# Patient Record
Sex: Female | Born: 1970
Health system: Southern US, Community
[De-identification: ages and names within clinical notes are randomized; demographics above are authoritative.]

## PROBLEM LIST (undated history)

## (undated) DIAGNOSIS — M545 Low back pain, unspecified: Secondary | ICD-10-CM

## (undated) DIAGNOSIS — I10 Essential (primary) hypertension: Secondary | ICD-10-CM

## (undated) DIAGNOSIS — J45909 Unspecified asthma, uncomplicated: Secondary | ICD-10-CM

## (undated) DIAGNOSIS — G4730 Sleep apnea, unspecified: Secondary | ICD-10-CM

## (undated) DIAGNOSIS — R0600 Dyspnea, unspecified: Secondary | ICD-10-CM

## (undated) DIAGNOSIS — C801 Malignant (primary) neoplasm, unspecified: Secondary | ICD-10-CM

## (undated) DIAGNOSIS — M1990 Unspecified osteoarthritis, unspecified site: Secondary | ICD-10-CM

## (undated) DIAGNOSIS — Z9289 Personal history of other medical treatment: Secondary | ICD-10-CM

## (undated) DIAGNOSIS — G43909 Migraine, unspecified, not intractable, without status migrainosus: Secondary | ICD-10-CM

## (undated) DIAGNOSIS — L309 Dermatitis, unspecified: Secondary | ICD-10-CM

## (undated) DIAGNOSIS — J309 Allergic rhinitis, unspecified: Secondary | ICD-10-CM

## (undated) DIAGNOSIS — F419 Anxiety disorder, unspecified: Secondary | ICD-10-CM

## (undated) HISTORY — DX: Dermatitis, unspecified: L30.9

## (undated) HISTORY — DX: Allergic rhinitis, unspecified: J30.9

## (undated) HISTORY — PX: OTHER SURGICAL HISTORY: SHX169

## (undated) HISTORY — PX: ABDOMINAL HYSTERECTOMY: SHX81

## (undated) HISTORY — PX: LIVER BIOPSY: SHX301

---

## 1998-03-14 ENCOUNTER — Emergency Department (HOSPITAL_COMMUNITY): Admission: EM | Admit: 1998-03-14 | Discharge: 1998-03-14 | Payer: Self-pay | Admitting: Emergency Medicine

## 1998-03-23 ENCOUNTER — Emergency Department (HOSPITAL_COMMUNITY): Admission: EM | Admit: 1998-03-23 | Discharge: 1998-03-23 | Payer: Self-pay | Admitting: Emergency Medicine

## 1998-11-29 ENCOUNTER — Emergency Department (HOSPITAL_COMMUNITY): Admission: EM | Admit: 1998-11-29 | Discharge: 1998-11-29 | Payer: Self-pay | Admitting: Emergency Medicine

## 2000-02-03 ENCOUNTER — Inpatient Hospital Stay (HOSPITAL_COMMUNITY): Admission: EM | Admit: 2000-02-03 | Discharge: 2000-02-03 | Payer: Self-pay | Admitting: *Deleted

## 2002-11-20 ENCOUNTER — Emergency Department (HOSPITAL_COMMUNITY): Admission: EM | Admit: 2002-11-20 | Discharge: 2002-11-20 | Payer: Self-pay | Admitting: Emergency Medicine

## 2002-11-28 ENCOUNTER — Encounter: Payer: Self-pay | Admitting: General Practice

## 2002-11-28 ENCOUNTER — Encounter: Admission: RE | Admit: 2002-11-28 | Discharge: 2002-11-28 | Payer: Self-pay | Admitting: General Practice

## 2003-02-04 ENCOUNTER — Emergency Department (HOSPITAL_COMMUNITY): Admission: EM | Admit: 2003-02-04 | Discharge: 2003-02-04 | Payer: Self-pay | Admitting: Emergency Medicine

## 2003-02-17 ENCOUNTER — Ambulatory Visit (HOSPITAL_COMMUNITY): Admission: RE | Admit: 2003-02-17 | Discharge: 2003-02-17 | Payer: Self-pay | Admitting: Obstetrics and Gynecology

## 2003-02-17 ENCOUNTER — Encounter: Payer: Self-pay | Admitting: Obstetrics and Gynecology

## 2003-03-22 ENCOUNTER — Other Ambulatory Visit: Admission: RE | Admit: 2003-03-22 | Discharge: 2003-03-22 | Payer: Self-pay | Admitting: Obstetrics and Gynecology

## 2003-04-03 ENCOUNTER — Inpatient Hospital Stay (HOSPITAL_COMMUNITY): Admission: AD | Admit: 2003-04-03 | Discharge: 2003-04-03 | Payer: Self-pay | Admitting: Obstetrics and Gynecology

## 2003-06-29 ENCOUNTER — Inpatient Hospital Stay (HOSPITAL_COMMUNITY): Admission: AD | Admit: 2003-06-29 | Discharge: 2003-06-29 | Payer: Self-pay | Admitting: Obstetrics and Gynecology

## 2003-09-22 ENCOUNTER — Ambulatory Visit (HOSPITAL_COMMUNITY): Admission: RE | Admit: 2003-09-22 | Discharge: 2003-09-22 | Payer: Self-pay | Admitting: Obstetrics and Gynecology

## 2003-10-10 ENCOUNTER — Ambulatory Visit (HOSPITAL_COMMUNITY): Admission: RE | Admit: 2003-10-10 | Discharge: 2003-10-10 | Payer: Self-pay | Admitting: Obstetrics and Gynecology

## 2003-10-24 ENCOUNTER — Ambulatory Visit (HOSPITAL_COMMUNITY): Admission: RE | Admit: 2003-10-24 | Discharge: 2003-10-24 | Payer: Self-pay | Admitting: Obstetrics and Gynecology

## 2003-11-16 ENCOUNTER — Inpatient Hospital Stay (HOSPITAL_COMMUNITY): Admission: AD | Admit: 2003-11-16 | Discharge: 2003-11-18 | Payer: Self-pay | Admitting: *Deleted

## 2004-10-30 ENCOUNTER — Other Ambulatory Visit: Admission: RE | Admit: 2004-10-30 | Discharge: 2004-10-30 | Payer: Self-pay | Admitting: Obstetrics and Gynecology

## 2005-02-25 ENCOUNTER — Emergency Department (HOSPITAL_COMMUNITY): Admission: EM | Admit: 2005-02-25 | Discharge: 2005-02-25 | Payer: Self-pay | Admitting: Emergency Medicine

## 2005-02-28 ENCOUNTER — Encounter (HOSPITAL_COMMUNITY): Admission: RE | Admit: 2005-02-28 | Discharge: 2005-03-26 | Payer: Self-pay | Admitting: Emergency Medicine

## 2005-03-14 ENCOUNTER — Emergency Department (HOSPITAL_COMMUNITY): Admission: EM | Admit: 2005-03-14 | Discharge: 2005-03-14 | Payer: Self-pay | Admitting: Emergency Medicine

## 2005-10-31 ENCOUNTER — Other Ambulatory Visit: Admission: RE | Admit: 2005-10-31 | Discharge: 2005-10-31 | Payer: Self-pay | Admitting: Obstetrics and Gynecology

## 2006-01-14 ENCOUNTER — Encounter: Payer: Self-pay | Admitting: Obstetrics and Gynecology

## 2006-04-17 ENCOUNTER — Other Ambulatory Visit: Admission: RE | Admit: 2006-04-17 | Discharge: 2006-04-17 | Payer: Self-pay | Admitting: Obstetrics and Gynecology

## 2010-08-09 ENCOUNTER — Emergency Department (HOSPITAL_COMMUNITY): Admission: EM | Admit: 2010-08-09 | Discharge: 2010-08-09 | Payer: Self-pay | Admitting: Emergency Medicine

## 2010-10-05 ENCOUNTER — Encounter: Payer: Self-pay | Admitting: Obstetrics and Gynecology

## 2010-11-26 LAB — URINE MICROSCOPIC-ADD ON

## 2010-11-26 LAB — DIFFERENTIAL
Basophils Absolute: 0 10*3/uL (ref 0.0–0.1)
Basophils Relative: 0 % (ref 0–1)
Eosinophils Absolute: 0.1 10*3/uL (ref 0.0–0.7)
Neutro Abs: 10 10*3/uL — ABNORMAL HIGH (ref 1.7–7.7)
Neutrophils Relative %: 77 % (ref 43–77)

## 2010-11-26 LAB — URINALYSIS, ROUTINE W REFLEX MICROSCOPIC
Bilirubin Urine: NEGATIVE
Bilirubin Urine: NEGATIVE
Glucose, UA: >1000 mg/dL — AB
Ketones, ur: NEGATIVE mg/dL
Leukocytes, UA: NEGATIVE
Nitrite: NEGATIVE
Protein, ur: NEGATIVE mg/dL
Specific Gravity, Urine: 1.037 — ABNORMAL HIGH (ref 1.005–1.030)
Urobilinogen, UA: 0.2 mg/dL (ref 0.0–1.0)
pH: 5 (ref 5.0–8.0)
pH: 6.5 (ref 5.0–8.0)

## 2010-11-26 LAB — URINE CULTURE: Culture  Setup Time: 201111251545

## 2010-11-26 LAB — POCT I-STAT, CHEM 8
Glucose, Bld: 86 mg/dL (ref 70–99)
HCT: 39 % (ref 36.0–46.0)
Hemoglobin: 13.3 g/dL (ref 12.0–15.0)
Potassium: 3.9 mEq/L (ref 3.5–5.1)
Sodium: 142 mEq/L (ref 135–145)
TCO2: 28 mmol/L (ref 0–100)

## 2010-11-26 LAB — CBC
MCH: 28.6 pg (ref 26.0–34.0)
MCHC: 31.2 g/dL (ref 30.0–36.0)
RDW: 13.8 % (ref 11.5–15.5)

## 2010-11-26 LAB — POCT CARDIAC MARKERS
CKMB, poc: <1 ng/mL — ABNORMAL LOW (ref 1.0–8.0)
Myoglobin, poc: 76.6 ng/mL (ref 12–200)

## 2010-11-26 LAB — RAPID STREP SCREEN (MED CTR MEBANE ONLY): Streptococcus, Group A Screen (Direct): NEGATIVE

## 2011-01-06 ENCOUNTER — Inpatient Hospital Stay (HOSPITAL_COMMUNITY): Payer: Medicaid Other

## 2011-01-06 ENCOUNTER — Inpatient Hospital Stay (HOSPITAL_COMMUNITY)
Admission: AD | Admit: 2011-01-06 | Discharge: 2011-01-06 | Disposition: A | Discharge status: Home / Self Care | Payer: Medicaid Other | Source: Ambulatory Visit | Attending: Obstetrics and Gynecology | Admitting: Obstetrics and Gynecology

## 2011-01-06 DIAGNOSIS — N949 Unspecified condition associated with female genital organs and menstrual cycle: Secondary | ICD-10-CM

## 2011-01-06 DIAGNOSIS — D500 Iron deficiency anemia secondary to blood loss (chronic): Secondary | ICD-10-CM

## 2011-01-06 DIAGNOSIS — N938 Other specified abnormal uterine and vaginal bleeding: Secondary | ICD-10-CM | POA: Insufficient documentation

## 2011-01-06 LAB — URINALYSIS, ROUTINE W REFLEX MICROSCOPIC
Bilirubin Urine: NEGATIVE
Glucose, UA: NEGATIVE mg/dL
Ketones, ur: NEGATIVE mg/dL
Nitrite: NEGATIVE
Specific Gravity, Urine: >1.03 — ABNORMAL HIGH (ref 1.005–1.030)
pH: 5 (ref 5.0–8.0)

## 2011-01-06 LAB — CBC
HCT: 35.8 % — ABNORMAL LOW (ref 36.0–46.0)
Hemoglobin: 10.8 g/dL — ABNORMAL LOW (ref 12.0–15.0)
MCH: 27.3 pg (ref 26.0–34.0)
MCHC: 30.2 g/dL (ref 30.0–36.0)
MCV: 90.6 fL (ref 78.0–100.0)
RBC: 3.95 MIL/uL (ref 3.87–5.11)

## 2011-01-06 LAB — URINE MICROSCOPIC-ADD ON

## 2011-02-19 ENCOUNTER — Other Ambulatory Visit: Payer: Self-pay | Admitting: Obstetrics and Gynecology

## 2011-02-19 ENCOUNTER — Encounter (HOSPITAL_COMMUNITY): Payer: Medicaid Other

## 2011-02-19 LAB — BASIC METABOLIC PANEL
BUN: 11 mg/dL (ref 6–23)
Chloride: 107 mEq/L (ref 96–112)
Creatinine, Ser: 0.72 mg/dL (ref 0.4–1.2)
GFR calc Af Amer: >60 mL/min (ref >60)
GFR calc non Af Amer: >60 mL/min (ref >60)
Potassium: 4.4 mEq/L (ref 3.5–5.1)

## 2011-02-19 LAB — SURGICAL PCR SCREEN
MRSA, PCR: NEGATIVE
Staphylococcus aureus: POSITIVE — AB

## 2011-02-19 LAB — CBC
MCV: 89.9 fL (ref 78.0–100.0)
Platelets: 342 10*3/uL (ref 150–400)
RBC: 3.87 MIL/uL (ref 3.87–5.11)
RDW: 13.7 % (ref 11.5–15.5)
WBC: 6.2 10*3/uL (ref 4.0–10.5)

## 2011-02-26 ENCOUNTER — Ambulatory Visit (HOSPITAL_COMMUNITY)
Admission: EM | Admit: 2011-02-26 | Discharge: 2011-02-28 | Disposition: A | Discharge status: Home / Self Care | Payer: Medicaid Other | Source: Ambulatory Visit | Attending: Obstetrics and Gynecology | Admitting: Obstetrics and Gynecology

## 2011-02-26 ENCOUNTER — Other Ambulatory Visit: Payer: Self-pay | Admitting: Obstetrics and Gynecology

## 2011-02-26 DIAGNOSIS — Z01812 Encounter for preprocedural laboratory examination: Secondary | ICD-10-CM | POA: Insufficient documentation

## 2011-02-26 DIAGNOSIS — Z01818 Encounter for other preprocedural examination: Secondary | ICD-10-CM | POA: Insufficient documentation

## 2011-02-26 DIAGNOSIS — N920 Excessive and frequent menstruation with regular cycle: Secondary | ICD-10-CM | POA: Insufficient documentation

## 2011-02-26 DIAGNOSIS — N8000 Endometriosis of the uterus, unspecified: Secondary | ICD-10-CM | POA: Insufficient documentation

## 2011-02-26 DIAGNOSIS — I10 Essential (primary) hypertension: Secondary | ICD-10-CM | POA: Insufficient documentation

## 2011-02-26 DIAGNOSIS — N938 Other specified abnormal uterine and vaginal bleeding: Secondary | ICD-10-CM | POA: Insufficient documentation

## 2011-02-26 DIAGNOSIS — N949 Unspecified condition associated with female genital organs and menstrual cycle: Secondary | ICD-10-CM | POA: Insufficient documentation

## 2011-02-26 LAB — PREGNANCY, URINE: Preg Test, Ur: NEGATIVE

## 2011-02-26 NOTE — H&P (Addendum)
  Margaret Margaret [name], Margaret [name] NO.:  [id number]  MEDICAL RECORD NO.:  [id number]  LOCATION:  SDC                           FACILITY:  [facility]  PHYSICIAN:  Sherron Monday, MD        DATE OF BIRTH:  1971/05/09  DATE OF ADMISSION:  02/19/2011 DATE OF DISCHARGE:                             HISTORY & PHYSICAL   ADMITTING DIAGNOSIS:  Irregular vaginal bleeding, dysfunctional uterine bleeding.  PROCEDURE:  Planned total vaginal hysterectomy.  HISTORY OF PRESENT ILLNESS:  A 40 year old G8, P 5-0-3-5 who has struggled with irregular bleeding for several years and menorrhagia and at one point, she had an IUD that was controlling her bleeding; however, it started working.  She wants to proceed with definitive management. We have discussed the risks, benefits, and alternatives of surgery including, but not limited to bleeding, infection, damage to internal organs, as well as trouble healing.  She voices understanding to this and wishes to proceed.  Past medical history complicated by vulvar irritation, as well as morbid obesity.  PAST MEDICAL HISTORY:  Significant for asthma, back spasms, hypertension, morbid obesity, thyroid disorder, and vulvar irritation.  PAST SURGICAL HISTORY:  Significant for D and C, tonsillectomy, and adenoidectomy.  GYN HISTORY:  She is a G8, P 5-0-3-5 with an LMP of January 03, 2011, benign endometrial biopsy.  An ultrasound performed in January revealed a normal-sized uterus and normal ovaries bilaterally.  MEDICATIONS:  Include clobetasol, albuterol, Flexeril, and hydrochlorothiazide.  ALLERGIES:  CODEINE, WHICH CAUSES HIVES AND SHE IS LATEX ALLERGIC AS WELL.  SOCIAL HISTORY:  She denies alcohol, tobacco, or drug use.  She is single and has a history of herpes and remote history of gonorrhea and trichomonas as well.  FAMILY MEDICAL HISTORY:  Significant for asthma, breast cancer, diabetes, and hypertension.  PHYSICAL EXAMINATION:  She is  afebrile.  VITAL SIGNS:  Stable.  GENERAL: No apparent distress.  Morbidly obese.  CARDIOVASCULAR:  Regular rate and rhythm.  Lungs: Clear to auscultation bilaterally.  Abdomen: Soft, morbidly obese, nontender, nondistended.  Good bowel sounds.  Symmetric and nontender.  Pelvic:  Normal external female genitalia.  Normal Bartholin's, urethral, and Skene's glands with good support.  Cervix and vagina are without lesions.  No cervical tenderness.  Difficult to appreciate her uterine size, appears to be normal in size.  ADNEXA:  No masses and nontender, also difficult to assess.  ASSESSMENT AND PLAN:  Given her menorrhagia and dysfunctional uterine bleeding, discussed with the patient total vaginal hysterectomy.  Given her age, there is no plan for USR or VSR abnormalities noted at the time of surgery.  Prior to being scheduled for surgery, she was cleared by her general doctor and started on medication for hypertension and was cleared.  We will proceed with surgery on June 13.     Sherron Monday, MD     JB/MEDQ  D:  02/25/2011  T:  02/25/2011  Job:  [id number]  Electronically Signed by Sherron Monday MD on 03/12/2011 01:05:37 PM

## 2011-02-27 LAB — CBC
Platelets: 333 10*3/uL (ref 150–400)
RDW: 13.6 % (ref 11.5–15.5)
WBC: 10.4 10*3/uL (ref 4.0–10.5)

## 2011-02-27 LAB — BASIC METABOLIC PANEL
Chloride: 101 mEq/L (ref 96–112)
GFR calc Af Amer: >60 mL/min (ref >60)
GFR calc non Af Amer: >60 mL/min (ref >60)
Potassium: 4.3 mEq/L (ref 3.5–5.1)

## 2011-03-01 NOTE — Op Note (Signed)
Margaret [name], SWANGO NO.:  [id number]  MEDICAL RECORD NO.:  [id number]  LOCATION:  9304                          FACILITY:  [facility]  PHYSICIAN:  Sherron Monday, MD        DATE OF BIRTH:  December 05, 1970  DATE OF PROCEDURE:  02/26/2011 DATE OF DISCHARGE:                              OPERATIVE REPORT   PREOPERATIVE DIAGNOSES:  Menorrhagia, dysfunctional uterine bleeding.  POSTOPERATIVE DIAGNOSES:  Menorrhagia, dysfunctional uterine bleeding.  PROCEDURE:  Total vaginal hysterectomy, uterosacral plication.  SURGEON:  Sherron Monday, MD  ASSISTANT:  Malachi Pro. Ambrose Mantle, MD  ANESTHESIA:  General.  FINDINGS:  6-week size uterus, normal tubes and ovaries bilaterally.  SPECIMEN:  Uterus and cervix to Pathology.  ESTIMATED BLOOD LOSS:  100 mL.  IV FLUIDS:  2200 mL.  URINE OUTPUT:  400 mL clear urine at the end of procedure.  COMPLICATIONS:  None.  DISPOSITION:  Stable to PACU following the procedure.  PROCEDURE:  After informed consent was reviewed with the patient including risks, benefits, and alternatives of the surgical procedure including but not limited to bleeding, infection, damage to the surrounding organs as well as trouble healing as well as possible need to convert to total abdominal hysterectomy.  She was transported to the OR where an attempt was made to place spinal anesthesia.  This was unsuccessful.  Therefore, she received general anesthesia when it was adequate, she was placed in the Yelofin stirrups, prepped and draped in a normal sterile fashion.  Her bladder was sterilely drained.  Gloves were changes, attention was turned to performing the procedure.  Using a heavy weighted speculum and Deaver, her cervix was visualized, grasped with Loman [place] and 20 mL of vasopressin was injected. The cervix was circumscribed with Bovie cautery.  Attempt was made to enter anteriorly some of the mucosal tissue was taken down and at that point, attention was  turned to entering the posteriorly, the mucosa was grasped with pickups and entered this incision was extended.  A tag was placed at 6 o'clock using 0 Vicryl.  The uterosacral ligaments were ligated bilaterally using 0 Vicryl.  The stiches were held.  The uterus and cervix were ligated in a stepwise fashion with 0 vicryl.  The mesosalpinx was ligated to the level of the cornu.  At the level of the cornu they were with Zeppelin clamps and the cornu were outlined.  They were carefully checked to make sure no bowel was entrapped and doubly ligated with 0 Vicryl.  The uterus was removed.  Inspection was performed of the pedicles.  They were found to be hemostatic with an additional sutures with 3-0 Vicryl as well as Bovie cautery.  The posterior aspect of the cuff was run with 2-0 vicryl.  The uterosacral ligaments were plicated with 0 vicryl and the held sutures were tied together.  The cuff was closed with 2-0 Vicryl in a running locked fashion.  It was noted to be hemostatic.  The patient tolerated the procedure well.  Sponge, lap, and needle counts were correct x2 at the end of the procedure.  The patient was awakened in stable condition and transported to the PACU.     Genella Bas Bovard,  MD     JB/MEDQ  D:  02/26/2011  T:  02/27/2011  Job:  [id number]  Electronically Signed by Sherron Monday MD on 03/01/2011 02:35:31 PM

## 2011-03-01 NOTE — Discharge Summary (Signed)
  NAMEDONETTA, Margaret [name] NO.:  [id number]  MEDICAL RECORD NO.:  [id number]  LOCATION:  9304                          [facility]ACILITY:  [facility]  PHYSICIAN:  Sherron Monday, MD        DATE OF BIRTH:  06-Jun-1971  DATE OF ADMISSION:  02/26/2011 DATE OF DISCHARGE:  02/27/2011                              DISCHARGE SUMMARY   ADMITTING DIAGNOSES: 1. Irregular vaginal bleeding. 2. Dysfunctional uterine bleeding. 3. Menorrhagia.  DISCHARGE DIAGNOSES: 1. Irregular vaginal bleeding. 2. Dysfunctional uterine bleeding. 3. Menorrhagia. 4. Status post total vaginal hysterectomy.  PROCEDURE:  Total vaginal hysterectomy.  HISTORY:  For the complete history and physical, please see the dictated note.  However, in brief, this is a 40 year old G8, P 5-0-3-5 with a history of dysfunctional uterine bleeding for several years who desires definitive management.  She was admitted on February 26, 2011 to undergo a vaginal hysterectomy.  This was performed without complication with an EBL of approximately 100 mL.  Her postoperative course was relatively uncomplicated.  She did struggle some with nausea.  On postoperative day #1, her pain was well controlled.  She was able to ambulate and void. Her hemoglobin had decreased from 10.5 to 9.8 and her creatinine was stable.  She was discharged to home with routine discharge instructions with numbers to call if any questions or problems as well as prescriptions for Motrin, Percocet, and Phenergan.     Sherron Monday, MD     JB/MEDQ  D:  02/27/2011  T:  02/27/2011  Job:  [id number]  Electronically Signed by Sherron Monday MD on 03/01/2011 02:36:04 PM

## 2011-03-12 NOTE — Discharge Summary (Signed)
  NAMESAMI, ROES NO.:  [id number]  MEDICAL RECORD NO.:  [id number]  LOCATION:                                 FACILITY:  PHYSICIAN:  Sherron Monday, MD        DATE OF BIRTH:  1970-11-27  DATE OF ADMISSION: DATE OF DISCHARGE:                              DISCHARGE SUMMARY   ADDENDUM  Ms. Deshazo was unable to be discharged to home on postoperative day #1 stating that she had dizziness, nausea, and itching after a near syncopal event in the restroom in the morning the first time that she arose.  This was through the day, controlled.  Her pain medication was switched to Vicodin which is much better tolerated.  She was discharged to home on postoperative day #2 with routine discharge instructions, numbers to call with any questions or problems as well as prescriptions for Vicodin, Motrin, and Phenergan.  She will follow up in the office in approximately 2-3 weeks and she voiced understanding.     Sherron Monday, MD     JB/MEDQ  D:  02/28/2011  T:  02/28/2011  Job:  [id number]  Electronically Signed by Sherron Monday MD on 03/12/2011 01:05:25 PM

## 2012-03-14 ENCOUNTER — Encounter (HOSPITAL_BASED_OUTPATIENT_CLINIC_OR_DEPARTMENT_OTHER): Payer: Medicaid Other

## 2012-05-06 ENCOUNTER — Ambulatory Visit (HOSPITAL_BASED_OUTPATIENT_CLINIC_OR_DEPARTMENT_OTHER): Payer: Medicaid Other | Attending: Allergy and Immunology

## 2012-05-06 VITALS — Ht 68.0 in | Wt 389.0 lb

## 2012-05-06 DIAGNOSIS — G4733 Obstructive sleep apnea (adult) (pediatric): Secondary | ICD-10-CM

## 2012-05-15 DIAGNOSIS — R0609 Other forms of dyspnea: Secondary | ICD-10-CM

## 2012-05-15 DIAGNOSIS — R0989 Other specified symptoms and signs involving the circulatory and respiratory systems: Secondary | ICD-10-CM

## 2012-05-15 DIAGNOSIS — G4733 Obstructive sleep apnea (adult) (pediatric): Secondary | ICD-10-CM

## 2012-05-16 NOTE — Procedures (Signed)
Margaret [name], MAILLE NO.:  [id number]  MEDICAL RECORD NO.:  [id number]          PATIENT TYPE:  OUT  LOCATION:  SLEEP CENTER                 FACILITY:  [facility]  PHYSICIAN:  Nadeem Romanoski D. [name], MD, FCCP, FACPDATE OF BIRTH:  06/20/1971  DATE OF STUDY:  05/06/2012                           NOCTURNAL POLYSOMNOGRAM  REFERRING PHYSICIAN:  Jessica Priest, M.D.  INDICATION FOR STUDY:  Hypersomnia with sleep apnea.  EPWORTH SLEEPINESS SCORE:  16/24, BMI 59.1, weight 389 pounds, height 68 inches, neck 14 inches.  MEDICATIONS:  Home medications were charted and reviewed.  SLEEP ARCHITECTURE:  Total sleep time 274 minutes with sleep efficiency 73.7%.  Stage I was 7.8%, stage II 81.6%, stage III absent.  REM 10.6% of total sleep time.  Sleep latency 3 minutes, REM latency 146.5 minutes, awake after sleep onset 90.5 minutes.  Arousal index 12.9. Bedtime medication:  Zolpidem.  RESPIRATORY DATA:  Apnea-hypopnea index (AHI) 12.5 per hour.  A total of 57 events were scored including 28 obstructive apneas and 29 hypopneas. Events were seen in all sleep positions, especially nonsupine and in REM.  REM AHI 74.5/hour.  There were insufficient early numbers of events to meet protocol requirements for application of split protocol, CPAP titration on this study night.  OXYGEN DATA:  Very loud snoring with oxygen desaturation to a nadir of 80% and mean oxygen saturation through the study of 97.2% on room air.  CARDIAC DATA:  Normal sinus rhythm.  MOVEMENT-PARASOMNIA:  No significant movement disturbance.  No bathroom trips.  IMPRESSIONS-RECOMMENDATIONS: 1. Sleep architecture was significant particularly for early morning     waking shortly before 4 a.m. with little subsequent sleep.  She had     taken zolpidem at bedtime.  On her entered information, she     indicated her problem at night is severe heartburn and choking     which interferes with sleep.  She did not describe that  on this     study night. 2. Mild obstructive sleep apnea/hypopnea syndrome, AHI 12.5/hour.     Events were seen in all sleep positions.  REM AHI 74.5/hour.  Very     loud snoring with oxygen desaturation to a nadir of 80% and mean     oxygen saturation through the study of 97.2% on room air. 3. There were insufficient early events to meet protocol requirements     for application of split protocol, CPAP titration on this study     night.  She can return for dedicated CPAP titration study if     appropriate.     Hamdi Vari D. [name], MD, [facility], FACP Diplomate, American Board of Sleep Medicine    CDY/MEDQ  D:  05/15/2012 09:59:00  T:  05/16/2012 00:41:05  Job:  [id number]

## 2012-06-07 ENCOUNTER — Encounter (HOSPITAL_BASED_OUTPATIENT_CLINIC_OR_DEPARTMENT_OTHER): Payer: Medicaid Other

## 2012-06-16 ENCOUNTER — Other Ambulatory Visit: Payer: Self-pay | Admitting: Internal Medicine

## 2012-06-16 DIAGNOSIS — Z1231 Encounter for screening mammogram for malignant neoplasm of breast: Secondary | ICD-10-CM

## 2012-07-08 ENCOUNTER — Ambulatory Visit
Admission: RE | Admit: 2012-07-08 | Discharge: 2012-07-08 | Disposition: A | Discharge status: Home / Self Care | Payer: Medicaid Other | Source: Ambulatory Visit | Attending: Internal Medicine | Admitting: Internal Medicine

## 2012-07-08 DIAGNOSIS — Z1231 Encounter for screening mammogram for malignant neoplasm of breast: Secondary | ICD-10-CM

## 2012-07-11 ENCOUNTER — Encounter (HOSPITAL_BASED_OUTPATIENT_CLINIC_OR_DEPARTMENT_OTHER): Payer: Medicaid Other

## 2012-10-07 ENCOUNTER — Ambulatory Visit: Payer: Medicaid Other | Attending: Sports Medicine | Admitting: Rehabilitation

## 2012-10-07 DIAGNOSIS — M2560 Stiffness of unspecified joint, not elsewhere classified: Secondary | ICD-10-CM | POA: Insufficient documentation

## 2012-10-07 DIAGNOSIS — IMO0001 Reserved for inherently not codable concepts without codable children: Secondary | ICD-10-CM | POA: Insufficient documentation

## 2012-10-07 DIAGNOSIS — M2550 Pain in unspecified joint: Secondary | ICD-10-CM | POA: Insufficient documentation

## 2012-10-07 DIAGNOSIS — R293 Abnormal posture: Secondary | ICD-10-CM | POA: Insufficient documentation

## 2012-10-21 ENCOUNTER — Ambulatory Visit: Payer: Medicaid Other | Admitting: Rehabilitation

## 2012-10-28 ENCOUNTER — Ambulatory Visit: Payer: Medicaid Other | Attending: Sports Medicine

## 2012-10-28 DIAGNOSIS — M2550 Pain in unspecified joint: Secondary | ICD-10-CM | POA: Insufficient documentation

## 2012-10-28 DIAGNOSIS — R293 Abnormal posture: Secondary | ICD-10-CM | POA: Insufficient documentation

## 2012-10-28 DIAGNOSIS — M2560 Stiffness of unspecified joint, not elsewhere classified: Secondary | ICD-10-CM | POA: Insufficient documentation

## 2012-10-28 DIAGNOSIS — IMO0001 Reserved for inherently not codable concepts without codable children: Secondary | ICD-10-CM | POA: Insufficient documentation

## 2012-11-04 ENCOUNTER — Encounter: Payer: Medicaid Other | Admitting: Rehabilitation

## 2012-11-11 ENCOUNTER — Encounter: Payer: Medicaid Other | Admitting: Rehabilitation

## 2013-03-20 ENCOUNTER — Ambulatory Visit (HOSPITAL_BASED_OUTPATIENT_CLINIC_OR_DEPARTMENT_OTHER): Payer: Medicaid Other | Attending: Allergy and Immunology

## 2013-03-20 VITALS — Ht 68.0 in | Wt 369.0 lb

## 2013-03-20 DIAGNOSIS — G4730 Sleep apnea, unspecified: Secondary | ICD-10-CM | POA: Insufficient documentation

## 2013-03-20 DIAGNOSIS — G4710 Hypersomnia, unspecified: Secondary | ICD-10-CM | POA: Insufficient documentation

## 2013-03-20 DIAGNOSIS — Z9989 Dependence on other enabling machines and devices: Secondary | ICD-10-CM

## 2013-03-26 DIAGNOSIS — G4730 Sleep apnea, unspecified: Secondary | ICD-10-CM

## 2013-03-26 DIAGNOSIS — G4710 Hypersomnia, unspecified: Secondary | ICD-10-CM

## 2013-03-26 NOTE — Procedures (Signed)
NAMEHARUMI, YAMIN NO.:  [id number]  MEDICAL RECORD NO.:  [id number]          PATIENT TYPE:  OUT  LOCATION:  SLEEP CENTER                 [facility]ACILITY[facility]  PHYSICIAN:  Yarelie Hams D. [name], MD, FCCP, FACPDATE OF BIRTH:  1970-11-11  DATE OF STUDY:  03/20/2013                           NOCTURNAL POLYSOMNOGRAM  REFERRING PHYSICIAN:  Jessica Priest, M.D.  INDICATION FOR STUDY:  Hypersomnia with sleep apnea.  EPWORTH SLEEPINESS SCORE:  17/24.  BMI 56.1, weight 369 pounds, height 68 inches, neck 14 inches.  MEDICATIONS:  Home medications are charted for review.  A baseline diagnostic NPSG on May 06, 2012, recorded AHI 12.5 per hour.  Body weight for that study was 389 pounds.  CPAP titration is now requested.  SLEEP ARCHITECTURE:  Total sleep time 329 minutes with sleep efficiency 82.3%.  Stage I was 9.3%, stage II 63.7%, stage III 0.3%, REM 26.7% of total sleep time.  Sleep latency 30 minutes, REM latency 69.5 minutes, awake after sleep onset 41.5 minutes.  Arousal index 5.8.  Bedtime Medication:  Ambien.  RESPIRATORY DATA:  CPAP titration protocol.  CPAP was titrated to 9 CWP, AHI 0 per hour.  She wore a small ResMed Quattro FX full-face mask with heated humidifier and an EPR of 3.  OXYGEN DATA:  With CPAP control, snoring was prevented and mean oxygen saturation held 98.1% on room air.  CARDIAC DATA:  Sinus rhythm.  MOVEMENT-PARASOMNIA:  A few limb jerks were noted with insignificant effect on sleep. Bathroom x1.  IMPRESSIONS-RECOMMENDATIONS: 1. Successful CPAP titration to 9 CWP, AHI 0 per hour.  She wore a     small ResMed Quattro FX full-face mask with heated humidifier and     an EPR of 3.  Snoring was prevented and mean oxygen saturation held     98.1% on room air. 2. Baseline diagnostic NPSG on May 06, 2012, had recorded AHI of     12.5 per hour.  Body weight for that study was 389 pounds.     Darlen Gledhill D. [name], MD, [facility],  FACP Diplomate, American Board of Sleep Medicine    CDY/MEDQ  D:  03/26/2013 10:23:54  T:  03/26/2013 12:04:53  Job:  [id number]

## 2013-07-29 ENCOUNTER — Encounter (HOSPITAL_COMMUNITY): Payer: Self-pay | Admitting: Emergency Medicine

## 2013-07-29 ENCOUNTER — Emergency Department (HOSPITAL_COMMUNITY)
Admission: EM | Admit: 2013-07-29 | Discharge: 2013-07-29 | Disposition: A | Discharge status: Home / Self Care | Payer: Medicaid Other | Attending: Emergency Medicine | Admitting: Emergency Medicine

## 2013-07-29 ENCOUNTER — Emergency Department (HOSPITAL_COMMUNITY): Payer: Medicaid Other

## 2013-07-29 DIAGNOSIS — Z8659 Personal history of other mental and behavioral disorders: Secondary | ICD-10-CM | POA: Insufficient documentation

## 2013-07-29 DIAGNOSIS — Y929 Unspecified place or not applicable: Secondary | ICD-10-CM | POA: Insufficient documentation

## 2013-07-29 DIAGNOSIS — Z87891 Personal history of nicotine dependence: Secondary | ICD-10-CM | POA: Insufficient documentation

## 2013-07-29 DIAGNOSIS — M62838 Other muscle spasm: Secondary | ICD-10-CM

## 2013-07-29 DIAGNOSIS — S46909A Unspecified injury of unspecified muscle, fascia and tendon at shoulder and upper arm level, unspecified arm, initial encounter: Secondary | ICD-10-CM | POA: Insufficient documentation

## 2013-07-29 DIAGNOSIS — R079 Chest pain, unspecified: Secondary | ICD-10-CM | POA: Insufficient documentation

## 2013-07-29 DIAGNOSIS — Z8739 Personal history of other diseases of the musculoskeletal system and connective tissue: Secondary | ICD-10-CM | POA: Insufficient documentation

## 2013-07-29 DIAGNOSIS — I10 Essential (primary) hypertension: Secondary | ICD-10-CM | POA: Insufficient documentation

## 2013-07-29 DIAGNOSIS — R51 Headache: Secondary | ICD-10-CM | POA: Insufficient documentation

## 2013-07-29 DIAGNOSIS — S4980XA Other specified injuries of shoulder and upper arm, unspecified arm, initial encounter: Secondary | ICD-10-CM | POA: Insufficient documentation

## 2013-07-29 DIAGNOSIS — Y9389 Activity, other specified: Secondary | ICD-10-CM | POA: Insufficient documentation

## 2013-07-29 DIAGNOSIS — Z91040 Latex allergy status: Secondary | ICD-10-CM | POA: Insufficient documentation

## 2013-07-29 DIAGNOSIS — S139XXA Sprain of joints and ligaments of unspecified parts of neck, initial encounter: Secondary | ICD-10-CM | POA: Insufficient documentation

## 2013-07-29 DIAGNOSIS — X503XXA Overexertion from repetitive movements, initial encounter: Secondary | ICD-10-CM | POA: Insufficient documentation

## 2013-07-29 DIAGNOSIS — Z8679 Personal history of other diseases of the circulatory system: Secondary | ICD-10-CM | POA: Insufficient documentation

## 2013-07-29 DIAGNOSIS — S161XXA Strain of muscle, fascia and tendon at neck level, initial encounter: Secondary | ICD-10-CM

## 2013-07-29 DIAGNOSIS — J45909 Unspecified asthma, uncomplicated: Secondary | ICD-10-CM | POA: Insufficient documentation

## 2013-07-29 HISTORY — DX: Migraine, unspecified, not intractable, without status migrainosus: G43.909

## 2013-07-29 HISTORY — DX: Unspecified asthma, uncomplicated: J45.909

## 2013-07-29 HISTORY — DX: Unspecified osteoarthritis, unspecified site: M19.90

## 2013-07-29 HISTORY — DX: Anxiety disorder, unspecified: F41.9

## 2013-07-29 HISTORY — DX: Low back pain, unspecified: M54.50

## 2013-07-29 HISTORY — DX: Essential (primary) hypertension: I10

## 2013-07-29 HISTORY — DX: Low back pain: M54.5

## 2013-07-29 MED ORDER — CYCLOBENZAPRINE HCL 10 MG PO TABS: 10.0000 mg | ORAL_TABLET | Freq: Once | ORAL | Status: DC

## 2013-07-29 MED ORDER — DIAZEPAM 5 MG PO TABS: 5.0000 mg | ORAL_TABLET | Freq: Four times a day (QID) | ORAL | Status: DC | PRN

## 2013-07-29 MED ORDER — IBUPROFEN 800 MG PO TABS
800.0000 mg | ORAL_TABLET | Freq: Once | ORAL | Status: AC
  Administered 2013-07-29: 800 mg via ORAL
  Filled 2013-07-29: qty 1

## 2013-07-29 MED ORDER — HYDROMORPHONE HCL PF 2 MG/ML IJ SOLN
2.0000 mg | Freq: Once | INTRAMUSCULAR | Status: AC
  Administered 2013-07-29: 2 mg via INTRAMUSCULAR
  Filled 2013-07-29: qty 1

## 2013-07-29 MED ORDER — OXYCODONE-ACETAMINOPHEN 5-325 MG PO TABS: 1.0000 | ORAL_TABLET | ORAL | Status: DC | PRN

## 2013-07-29 MED ORDER — DIAZEPAM 5 MG PO TABS
5.0000 mg | ORAL_TABLET | Freq: Once | ORAL | Status: AC
  Administered 2013-07-29: 5 mg via ORAL
  Filled 2013-07-29: qty 1

## 2013-07-29 NOTE — ED Notes (Signed)
EDP at bedside  

## 2013-07-29 NOTE — ED Provider Notes (Addendum)
CSN: [id number]     Arrival date & time 07/29/13  4098 History   First MD Initiated Contact with Patient 07/29/13 [phone]     Chief Complaint  Patient presents with  . Shoulder Pain  . Neck Pain   (Consider location/radiation/quality/duration/timing/severity/associated sxs/prior Treatment) HPI Comments: 42 year old female presents with 2 days of right-sided neck and shoulder pain. Started after she woke up and feels like she slept on her side wrong. The pain has been progressively worsening. She took one muscle relaxer at home that did not help. She's been feeling some tingling in her fingers on the right sided and hurts to move her arm. She's now starting to get pain radiating to her right anterior chest. No dyspnea. No exertional symptoms. She has no pain the middle of her neck. The pain on the right neck/shoulder also radiates up to her face/head. No fevers or sick contacts. No vomiting, diaphoresis. No weakness in her extremities. States she has "arthritis" in her neck. No B/B incontinence.     Past Medical History  Diagnosis Date  . Arthritis   . Asthma   . Hypertension   . Migraine   . Lower back pain   . Anxiety diagnosed in 1990   Past Surgical History  Procedure Laterality Date  . Abdominal hysterectomy     No family history on file. History  Substance Use Topics  . Smoking status: Former Smoker    Quit date: 07/30/1999  . Smokeless tobacco: Never Used  . Alcohol Use: No   OB History   Grav Para Term Preterm Abortions TAB SAB Ect Mult Living                 Review of Systems  Constitutional: Negative for fever, chills and diaphoresis.  Respiratory: Negative for shortness of breath.   Cardiovascular: Positive for chest pain.  Gastrointestinal: Negative for nausea, vomiting and abdominal pain.  Musculoskeletal: Positive for neck pain and neck stiffness. Negative for back pain.  Neurological: Positive for headaches. Negative for weakness.       Tingling in RUE  All  other systems reviewed and are negative.    Allergies  Latex and Tylenol  Home Medications  No current outpatient prescriptions on file. BP 153/101  Pulse 69  Temp(Src) 97.6 F (36.4 C) (Oral)  Resp 18  SpO2 100% Physical Exam  Nursing note and vitals reviewed. Constitutional: She is oriented to person, place, and time. She appears well-developed and well-nourished.  HENT:  Head: Normocephalic and atraumatic.  Right Ear: External ear normal.  Left Ear: External ear normal.  Nose: Nose normal.  Mouth/Throat: Uvula is midline and oropharynx is clear and moist. No oropharyngeal exudate or tonsillar abscesses.  Eyes: EOM are normal. Pupils are equal, round, and reactive to light. Right eye exhibits no discharge. Left eye exhibits no discharge.  Neck: Neck supple. Muscular tenderness present. No spinous process tenderness present. No rigidity.  Cardiovascular: Normal rate, regular rhythm and normal heart sounds.   Pulses:      Radial pulses are 2+ on the right side, and 2+ on the left side.  Pulmonary/Chest: Effort normal and breath sounds normal. She exhibits tenderness (right anterior chest).  Abdominal: Soft. There is no tenderness.  Musculoskeletal:       Right shoulder: She exhibits decreased range of motion and tenderness.       Cervical back: She exhibits tenderness.  Neurological: She is alert and oriented to person, place, and time. She has normal strength. No cranial  nerve deficit or sensory deficit. Gait normal. GCS eye subscore is 4. GCS verbal subscore is 5. GCS motor subscore is 6.  5/5 strength in all 4 extremities. Full strength testing in RUE limited due to pain  Skin: Skin is warm and dry.    ED Course  Procedures (including critical care time) Labs Review Labs Reviewed - No data to display Imaging Review Dg Cervical Spine Complete  07/29/2013   CLINICAL DATA:  Neck pain  EXAM: CERVICAL SPINE  4+ VIEWS  COMPARISON:  None.  FINDINGS: Seven views of cervical  spine submitted. No acute fracture or subluxation. C1-C2 relationship is unremarkable. There is disc space flattening with mild anterior spurring at C4-C5 level. Moderate anterior spurring noted upper endplate of C5. Disc space flattening with moderate anterior spurring at C5-C6 level. No prevertebral soft tissue swelling. Cervical airway is patent. Limited visualization of C7 vertebral body. Mild bilateral narrowing of C5 neural foramina.  IMPRESSION: No acute fracture or subluxation. Degenerative changes as described above.   Electronically Signed   By: Natasha Mead M.D.   On: 07/29/2013 09:24    EKG Interpretation     Ventricular Rate:  66 PR Interval:  179 QRS Duration: 100 QT Interval:  396 QTC Calculation: 415 R Axis:   76 Text Interpretation:  Normal sinus rhythm Normal ECG No significant change since last tracing            MDM   1. Neck strain, initial encounter   2. Neck muscle spasm    Patient with worsening neck/trapezius pain. Pain radiates into her arm, chest and head. Has some stiffness in neck but no throat symptoms, fever, trouble swallowing. No concern for deep space infection or meningitis. Has tingling but no hard signs of neurologic injury. Chest sx seem to be from an MSK etiology, with right sided sx, muscular pain and normal EKG there is no concern for ACS. Pain treated in ED, will d/c with pain control and muscle relaxation. ROM of both neck and shoulder significantly improved with treatment in ED. ROM exercises at home. Will d/c with percocet and valium, discussed precautions and not using both at the same time. Discussed strict return precautions.     Audree Camel, MD 07/29/13 1000  Audree Camel, MD 07/29/13 [phone]

## 2013-07-29 NOTE — ED Notes (Addendum)
Per pt "slept wrong" resulting in right sided neck and shoulder tightness. Pt reports throbbing down right arm and numbness to right hand. Pt reports "arthritis of spine."

## 2013-07-29 NOTE — ED Notes (Addendum)
Pt ambulated to xray and return from xray. Pt reports pain 8/10 at present time and able to lift/move right arm more freely.

## 2013-07-29 NOTE — ED Notes (Signed)
Bed: WA17 Expected date:  Expected time:  Means of arrival:  Comments: EMS-shoulder pain

## 2013-07-29 NOTE — ED Notes (Signed)
Per EMS pt "slept wrong Wednesday night resulting in right neck and shoulder pain."

## 2013-09-30 ENCOUNTER — Other Ambulatory Visit: Payer: Self-pay | Admitting: Internal Medicine

## 2013-09-30 DIAGNOSIS — Z1231 Encounter for screening mammogram for malignant neoplasm of breast: Secondary | ICD-10-CM

## 2013-10-21 ENCOUNTER — Ambulatory Visit: Payer: Medicaid Other

## 2014-03-14 ENCOUNTER — Ambulatory Visit: Payer: Self-pay | Admitting: Podiatry

## 2014-03-28 ENCOUNTER — Ambulatory Visit: Payer: Self-pay | Admitting: Podiatry

## 2014-03-31 ENCOUNTER — Ambulatory Visit: Payer: Self-pay

## 2014-04-06 ENCOUNTER — Encounter: Payer: Self-pay | Admitting: Podiatry

## 2014-04-06 ENCOUNTER — Ambulatory Visit (INDEPENDENT_AMBULATORY_CARE_PROVIDER_SITE_OTHER): Payer: Medicaid Other | Admitting: Podiatry

## 2014-04-06 VITALS — BP 152/91 | HR 65 | Resp 17 | Ht 69.0 in | Wt 369.0 lb

## 2014-04-06 DIAGNOSIS — L600 Ingrowing nail: Secondary | ICD-10-CM

## 2014-04-06 NOTE — Progress Notes (Signed)
   Subjective:    Patient ID: Margaret [name], female    DOB: 07/14/1971, 43 y.o.   MRN: [id number]  HPI Comments: Pt complains of pain for over 8 months, due to the thick, darkened, elongated 2nd toenails on both feet.     Review of Systems  Constitutional: Positive for fatigue.  HENT: Positive for sinus pressure.   Respiratory:       Sleep apnea - wear CPAP for sleeping  Cardiovascular:       Calf pain when walking  Musculoskeletal: Positive for arthralgias, back pain, gait problem and myalgias.  Psychiatric/Behavioral: Positive for sleep disturbance. The patient is nervous/anxious.   All other systems reviewed and are negative.      Objective:   Physical Exam        Assessment & Plan:

## 2014-04-06 NOTE — Patient Instructions (Signed)

## 2014-04-07 NOTE — Progress Notes (Signed)
Subjective:     Patient ID: Margaret [name], female   DOB: Dec 04, 1970, 43 y.o.   MRN: [id number]  HPI poor health patient presents with severe nail disease second both feet that are painful and she is having difficulty in being able to wear shoe gear with any cannot cut them   Review of Systems  All other systems reviewed and are negative.      Objective:   Physical Exam  Nursing note and vitals reviewed. Constitutional: She is oriented to person, place, and time.  Cardiovascular: Intact distal pulses.   Musculoskeletal: Normal range of motion.  Neurological: She is oriented to person, place, and time.  Skin: Skin is warm and dry.   neurovascular status intact with muscle strength in adequate and patient noted to have severe nail disease second both feet that are thick dystrophic and impossible to cut and very painful when pressed area digits are well-perfused and arch height is moderately depressed    Assessment:     Severe nail disease second both feet that are very painful and impossible to cut    Plan:     H&P discussed condition explained. These need to be removed and I did explain the surgery for this and the risk of the procedure. Patient wants procedure and today I infiltrated each second toe 60 mg I can Marcaine mixture removed the second nails exposed the matrix and apply chemical for applications phenol 30 seconds followed by alcohol lavaged and sterile dressing. Gave instructions on soaks and reappoint

## 2014-04-13 ENCOUNTER — Encounter: Payer: Self-pay | Admitting: Podiatry

## 2014-04-19 ENCOUNTER — Ambulatory Visit: Payer: Medicaid Other | Admitting: Podiatry

## 2014-06-30 ENCOUNTER — Other Ambulatory Visit: Payer: Self-pay

## 2015-12-26 DIAGNOSIS — J301 Allergic rhinitis due to pollen: Secondary | ICD-10-CM | POA: Diagnosis not present

## 2015-12-27 DIAGNOSIS — J3089 Other allergic rhinitis: Secondary | ICD-10-CM | POA: Diagnosis not present

## 2016-01-08 ENCOUNTER — Ambulatory Visit (INDEPENDENT_AMBULATORY_CARE_PROVIDER_SITE_OTHER): Payer: Medicaid Other | Admitting: *Deleted

## 2016-01-08 DIAGNOSIS — J309 Allergic rhinitis, unspecified: Secondary | ICD-10-CM | POA: Diagnosis not present

## 2016-01-17 ENCOUNTER — Ambulatory Visit (INDEPENDENT_AMBULATORY_CARE_PROVIDER_SITE_OTHER): Payer: Medicaid Other

## 2016-01-17 DIAGNOSIS — J309 Allergic rhinitis, unspecified: Secondary | ICD-10-CM | POA: Diagnosis not present

## 2016-02-07 ENCOUNTER — Ambulatory Visit (INDEPENDENT_AMBULATORY_CARE_PROVIDER_SITE_OTHER): Payer: Medicaid Other

## 2016-02-07 DIAGNOSIS — J309 Allergic rhinitis, unspecified: Secondary | ICD-10-CM | POA: Diagnosis not present

## 2016-02-28 ENCOUNTER — Ambulatory Visit (INDEPENDENT_AMBULATORY_CARE_PROVIDER_SITE_OTHER): Payer: Medicaid Other

## 2016-02-28 DIAGNOSIS — J309 Allergic rhinitis, unspecified: Secondary | ICD-10-CM

## 2016-03-06 ENCOUNTER — Telehealth: Payer: Self-pay | Admitting: *Deleted

## 2016-03-06 ENCOUNTER — Ambulatory Visit (INDEPENDENT_AMBULATORY_CARE_PROVIDER_SITE_OTHER): Payer: Medicaid Other

## 2016-03-06 DIAGNOSIS — J309 Allergic rhinitis, unspecified: Secondary | ICD-10-CM | POA: Diagnosis not present

## 2016-03-06 NOTE — Telephone Encounter (Signed)
Pt states that she received a bill with a date of service from 02-07-2016. Pt states she was not in the office that day and that she usually prepays for shots. Would like a return call.

## 2016-03-13 NOTE — Telephone Encounter (Signed)
Lm on 03-06-16 that we are showing an inj

## 2016-03-31 ENCOUNTER — Telehealth: Payer: Self-pay | Admitting: Allergy and Immunology

## 2016-03-31 NOTE — Telephone Encounter (Signed)
She keeps getting bills for charges that she has already paid for. She has her receipts for these payments. She mentioned 2 charges in particular one for DOS 02/07/16 and 01/17/16. She says that her son also gets shots but none of these charges were for him. She has an appointment this afternoon. Could you please give her a call tomorrow morning 04/01/16? Her son is [name] DOB 11/16/03. Thanks

## 2016-07-18 NOTE — Telephone Encounter (Signed)
Just found note today. Looked at account. Payment has been made  Account is clear of charges for DOS.

## 2016-08-15 ENCOUNTER — Other Ambulatory Visit: Payer: Self-pay | Admitting: Gynecology

## 2016-08-15 DIAGNOSIS — R928 Other abnormal and inconclusive findings on diagnostic imaging of breast: Secondary | ICD-10-CM

## 2016-08-26 ENCOUNTER — Ambulatory Visit
Admission: RE | Admit: 2016-08-26 | Discharge: 2016-08-26 | Disposition: A | Discharge status: Home / Self Care | Payer: Medicaid Other | Source: Ambulatory Visit | Attending: Gynecology | Admitting: Gynecology

## 2016-08-26 DIAGNOSIS — R928 Other abnormal and inconclusive findings on diagnostic imaging of breast: Secondary | ICD-10-CM

## 2016-10-07 NOTE — Addendum Note (Signed)
Addended by: Felipa Emory on: 10/07/2016 03:38 PM   Modules accepted: Orders

## 2016-10-20 ENCOUNTER — Other Ambulatory Visit: Payer: Self-pay

## 2016-11-26 ENCOUNTER — Encounter: Payer: Self-pay | Admitting: Podiatry

## 2016-11-26 ENCOUNTER — Ambulatory Visit (INDEPENDENT_AMBULATORY_CARE_PROVIDER_SITE_OTHER): Payer: Medicaid Other | Admitting: Podiatry

## 2016-11-26 DIAGNOSIS — M79609 Pain in unspecified limb: Principal | ICD-10-CM

## 2016-11-26 DIAGNOSIS — L600 Ingrowing nail: Secondary | ICD-10-CM

## 2016-11-26 DIAGNOSIS — L603 Nail dystrophy: Secondary | ICD-10-CM

## 2016-11-26 DIAGNOSIS — B351 Tinea unguium: Secondary | ICD-10-CM

## 2016-11-26 DIAGNOSIS — L608 Other nail disorders: Secondary | ICD-10-CM

## 2016-11-26 NOTE — Patient Instructions (Signed)

## 2016-12-06 NOTE — Progress Notes (Signed)
   Subjective: Patient presents today for evaluation of of pain to the second digit right foot. Patient was last seen 04/07/2014 in the care of Dr. Paulla Dolly at which time total permanent nail avulsions were performed to the bilateral second digits. Patient states that the toenail to the second digit right foot has recurred and is very symptomatic and painful.  Objective:  General: Well developed, nourished, in no acute distress, alert and oriented x3   Dermatology: Skin is warm, dry and supple bilateral. Toenails second digit right foot appears to be very painful and symptomatic. This toenail is thickened, onychodystrophy, and hyperkeratotic. Severe pain on palpation noted as well.  Vascular: Dorsalis Pedis artery and Posterior Tibial artery pedal pulses palpable. No lower extremity edema noted.   Neruologic: Grossly intact via light touch bilateral.  Musculoskeletal: Muscular strength within normal limits in all groups bilateral. Normal range of motion noted to all pedal and ankle joints.   Assesement: #1 painful toenail secondary to onychomycosis second digit right foot  Plan of Care:  1. Patient evaluated.  2. Discussed treatment alternatives and plan of care. Explained nail avulsion procedure and post procedure course to patient. 3. Patient opted for permanent total nail avulsion.  4. Prior to procedure, local anesthesia infiltration utilized using 3 ml of a 50:50 mixture of 2% plain lidocaine and 0.5% plain marcaine in a normal hallux block fashion and a betadine prep performed.  5. Total permanent nail avulsion with chemical matrixectomy performed using [id number] applications of phenol followed by alcohol flush.  6. Light dressing applied. 7. Return to clinic in 2 weeks.   Edrick Kins, DPM [facility]  Dr. Edrick Kins, [address]                                        [place],  [address]                Office [phone]  Fax [phone]

## 2016-12-10 ENCOUNTER — Encounter: Payer: Self-pay | Admitting: Podiatry

## 2016-12-10 ENCOUNTER — Ambulatory Visit (INDEPENDENT_AMBULATORY_CARE_PROVIDER_SITE_OTHER): Payer: Medicaid Other | Admitting: Podiatry

## 2016-12-10 DIAGNOSIS — M79676 Pain in unspecified toe(s): Secondary | ICD-10-CM

## 2016-12-10 DIAGNOSIS — L608 Other nail disorders: Secondary | ICD-10-CM

## 2016-12-10 DIAGNOSIS — L609 Nail disorder, unspecified: Secondary | ICD-10-CM | POA: Diagnosis not present

## 2016-12-10 DIAGNOSIS — Z872 Personal history of diseases of the skin and subcutaneous tissue: Secondary | ICD-10-CM

## 2016-12-13 NOTE — Progress Notes (Signed)
   Subjective: Patient presents today 2 weeks post ingrown nail permanent nail avulsion procedure. Patient states that the toe and nail fold is feeling much better.  Objective: Skin is warm, dry and supple. Nail and respective nail fold appears to be healing appropriately. Open wound to the associated nail fold with a granular wound base and moderate amount of fibrotic tissue. Minimal drainage noted. Mild erythema around the periungual region likely due to phenol chemical matricectomy.  Assessment: #1 postop permanent total nail avulsion second digit right foot #2 open wound periungual nail fold of respective digit.   Plan of care: #1 patient was evaluated  #2 debridement of open wound was performed to the periungual border of the respective toe using a currette. Antibiotic ointment and Band-Aid was applied. #3 patient is to return to clinic on a PRN  basis.   Edrick Kins, DPM [facility]  Dr. Edrick Kins, [address]                                        [place], [place] [address]                Office [phone]  Fax [phone]

## 2017-05-22 ENCOUNTER — Encounter: Payer: Self-pay | Admitting: Allergy

## 2017-05-22 ENCOUNTER — Ambulatory Visit (INDEPENDENT_AMBULATORY_CARE_PROVIDER_SITE_OTHER): Payer: Medicaid Other | Admitting: Allergy

## 2017-05-22 VITALS — BP 132/80 | HR 76 | Resp 19 | Ht 67.5 in | Wt 381.2 lb

## 2017-05-22 DIAGNOSIS — H1010 Acute atopic conjunctivitis, unspecified eye: Secondary | ICD-10-CM

## 2017-05-22 DIAGNOSIS — L2089 Other atopic dermatitis: Secondary | ICD-10-CM

## 2017-05-22 DIAGNOSIS — J309 Allergic rhinitis, unspecified: Secondary | ICD-10-CM

## 2017-05-22 DIAGNOSIS — J4540 Moderate persistent asthma, uncomplicated: Secondary | ICD-10-CM

## 2017-05-22 MED ORDER — BUDESONIDE-FORMOTEROL FUMARATE 80-4.5 MCG/ACT IN AERO: 2.0000 | INHALATION_SPRAY | Freq: Two times a day (BID) | RESPIRATORY_TRACT | 5 refills | Status: DC

## 2017-05-22 MED ORDER — ALBUTEROL SULFATE HFA 108 (90 BASE) MCG/ACT IN AERS: 2.0000 | INHALATION_SPRAY | RESPIRATORY_TRACT | 2 refills | Status: DC | PRN

## 2017-05-22 MED ORDER — PIMECROLIMUS 1 % EX CREA
TOPICAL_CREAM | Freq: Two times a day (BID) | CUTANEOUS | 3 refills | Status: DC
Start: 2017-05-22 — End: 2017-10-16

## 2017-05-22 MED ORDER — MONTELUKAST SODIUM 10 MG PO TABS: 10.0000 mg | ORAL_TABLET | Freq: Every day | ORAL | 5 refills | Status: DC

## 2017-05-22 MED ORDER — FLUTICASONE PROPIONATE 50 MCG/ACT NA SUSP: 2.0000 | Freq: Every day | NASAL | 5 refills | Status: DC | PRN

## 2017-05-22 MED ORDER — OLOPATADINE HCL 0.2 % OP SOLN: 1.0000 [drp] | Freq: Every day | OPHTHALMIC | 5 refills | Status: DC | PRN

## 2017-05-22 NOTE — Patient Instructions (Addendum)
Eczema      - will have your use Elidel apply thin layer twice a day to affected areas on face (eyelids - be careful not to get into eye; around mouth), chest, arms.      - you may try use of moisturizes for sensitive skin (Cetaphil, Vanicream, CeraVe).        - continue Zyrtec for help with itch control  Allergic rhinoconjunctivitis     - continue allergen avoidance measures     - continue Zyrtec as above, Pataday 1 drop each eye as needed for itchy/watery/red eyes, Flonase 2 spray each nostril for nasal congestion/drainage  Asthma    - at this time not well controlled    - start Symbicort 80mcg 2 puffs twice a day    - have access to albuterol inhaler 2 puffs every 4-6 hours as needed for cough/wheeze/shortness of breath/chest tightness.  May use 15-20 minutes prior to activity.   Monitor frequency of use.    Asthma control goals:   Full participation in all desired activities (may need albuterol before activity)  Albuterol use two time or less a week on average (not counting use with activity)  Cough interfering with sleep two time or less a month  Oral steroids no more than once a year  No hospitalizations  Follow-up 3-4 months or sooner if needed

## 2017-05-22 NOTE — Progress Notes (Signed)
New Patient Note  RE: Margaret [name] MRN: [id number] DOB: Apr 12, 1971 Date of Office Visit: 05/22/2017  Referring provider: Nolene Ebbs, MD Primary care provider: Nolene Ebbs, MD  Chief Complaint: Eczema on eyelids  History of present illness: Margaret [name] is a 46 y.o. female presenting today for consultation for eczema. She also has a history of asthma, allergic rhinoconjunctivitis.  She is a former patient of ours with last office visit in 2014.    She represents today as her eczema is getting worse.  She has significant eczema on her eyelids and around her mouth.  She also has issues on her arms, chest and around her private areas (she does have lichen sclerosis which is being treated with clobetasol).  She reports she does not have anything topically to put the areas on her face.  She does not use any makeup on face and states she does not tolerate many moisturizers including vaseline.  She does feel like heat makes her skin flare worse.   She has history of allergic rhinoconjunctivitis.  She has a lot of sneezing, nasal drainage and congestion worse during pollen season.  She was on allergen immunotherapy for several months but stopped due to insurance issues.    She uses pataday as needed and takes Zyrtec daily.    She has history of asthma.  She knows that heat/humidity is a trigger.   She has symptoms of cough and some wheezing.  She uses proair quite frequently (about 6 times a week) currently due to the heat/humidity.  She was on Qvar but has not had any recently as she states insurance stopped covering.  She denies any ED/UC visits or oral steroids needs in past year.      She does have OSA and sleeps with CPAP.    Review of systems: Review of Systems  Constitutional: Negative for chills, fever, malaise/fatigue and weight loss.  HENT: Positive for congestion. Negative for ear discharge, ear pain, nosebleeds, sinus pain and sore throat.   Eyes: Positive for  pain (eyelid pain with rash). Negative for discharge and redness.  Respiratory: Positive for cough, shortness of breath and wheezing.   Cardiovascular: Negative for chest pain.  Gastrointestinal: Negative for abdominal pain, constipation, diarrhea, heartburn, nausea and vomiting.  Musculoskeletal: Negative for joint pain.  Skin: Positive for itching and rash.  Neurological: Negative for headaches.    All other systems negative unless noted above in HPI  Past medical history: Past Medical History:  Diagnosis Date  . Allergic rhinitis   . Anxiety diagnosed in 1990  . Arthritis   . Asthma   . Eczema   . Hypertension   . Lower back pain   . Migraine     Past surgical history: Past Surgical History:  Procedure Laterality Date  . ABDOMINAL HYSTERECTOMY      Family history:  History reviewed. No pertinent family history.  Social history:  She lives in a home with carpeting with electric heating and central cooling. There are no pets in the home. There is concern for water damage or mildew in under the sink in her bathroom. She is in school. She has no smoking history.   Medication List: Allergies as of 05/22/2017      Reactions   Latex Hives, Shortness Of Breath   Tylenol [acetaminophen] Hives   Codeine Hives      Medication List       Accurate as of 05/22/17  4:23 PM. Always use your most  recent med list.          albuterol 108 (90 Base) MCG/ACT inhaler Commonly known as:  PROVENTIL HFA;VENTOLIN HFA Inhale 2 puffs into the lungs every 6 (six) hours as needed for wheezing or shortness of breath.   albuterol 108 (90 Base) MCG/ACT inhaler Commonly known as:  PROAIR HFA Inhale 2 puffs into the lungs every 4 (four) hours as needed for wheezing or shortness of breath.   Boric Acid 4 % Soln boric acid   budesonide-formoterol 80-4.5 MCG/ACT inhaler Commonly known as:  SYMBICORT Inhale 2 puffs into the lungs 2 (two) times daily.   Cetirizine HCl 10 MG  Caps cetirizine 10 mg tablet  TAKE ONE TABLET BY MOUTH EVERY DAY AS NEEDED   cetirizine-pseudoephedrine 5-120 MG tablet Commonly known as:  ZYRTEC-D Take 1 tablet by mouth 2 (two) times daily.   CLOBETASOL PROPIONATE EMULSION EX Apply topically.   clotrimazole 1 % cream Commonly known as:  LOTRIMIN Apply 1 application topically 2 (two) times daily.   cyclobenzaprine 10 MG tablet Commonly known as:  FLEXERIL Take 10 mg by mouth 3 (three) times daily as needed for muscle spasms.   diazepam 5 MG tablet Commonly known as:  VALIUM Take 1 tablet (5 mg total) by mouth every 6 (six) hours as needed for muscle spasms.   DIFLUCAN 150 MG tablet Generic drug:  fluconazole Diflucan 150 mg tablet  take 1 tab po now and repeat in 3 days   fluconazole 100 MG tablet Commonly known as:  DIFLUCAN fluconazole 100 mg tablet  TAKE ONE TABLET BY MOUTH NOW & REPEAT IN 3D   ergocalciferol 50000 units capsule Commonly known as:  VITAMIN D2 Take 50,000 Units by mouth once a week.   fluticasone 50 MCG/ACT nasal spray Commonly known as:  FLONASE Place 2 sprays into both nostrils daily as needed for allergies or rhinitis.   ibuprofen 800 MG tablet Commonly known as:  ADVIL,MOTRIN Take 800 mg by mouth every 8 (eight) hours as needed for moderate pain.   lisinopril-hydrochlorothiazide 10-12.5 MG tablet Commonly known as:  PRINZIDE,ZESTORETIC Take 1 tablet by mouth daily.   lisinopril-hydrochlorothiazide 20-12.5 MG tablet Commonly known as:  PRINZIDE,ZESTORETIC Take 1 tablet by mouth daily.   methocarbamol 500 MG tablet Commonly known as:  ROBAXIN methocarbamol 500 mg tablet  TAKE ONE TABLET BY MOUTH TWICE DAILY AS NEEDED   montelukast 10 MG tablet Commonly known as:  SINGULAIR Take 1 tablet (10 mg total) by mouth at bedtime.   nystatin powder Generic drug:  nystatin Apply topically.   nystatin cream Commonly known as:  MYCOSTATIN APPLY SMALL AMOUNT TO THE EXTERNAL AFFECTED AREA BY  TOPICAL ROUTE TWO TIMES PER DAY AS NEEDED   Olopatadine HCl 0.2 % Soln Commonly known as:  PATADAY Apply 1 drop to eye daily as needed.   pimecrolimus 1 % cream Commonly known as:  ELIDEL Apply topically 2 (two) times daily.   QVAR 80 MCG/ACT inhaler Generic drug:  beclomethasone Qvar 80 mcg/actuation Metered Aerosol oral inhaler  INHALE 2 PUFFS TWICE DAILY TO PREVENT COUGH/WHEEZE INCREASE WITH FLAREUPS AS NEEDED   tiZANidine 4 MG tablet Commonly known as:  ZANAFLEX Take 4 mg by mouth every 6 (six) hours as needed for muscle spasms.   TOBRADEX ophthalmic ointment Generic drug:  tobramycin-dexamethasone TobraDex 0.3 %-0.1 % eye ointment  1 application twice a day   triamcinolone 55 MCG/ACT Aero nasal inhaler Commonly known as:  NASACORT Place 2 sprays into the nose daily.  valACYclovir 500 MG tablet Commonly known as:  VALTREX Take 500 mg by mouth 2 (two) times daily.   VOLTAREN 1 % Gel Generic drug:  diclofenac sodium Voltaren 1 % topical gel  APPLY FOUR grams FOUR TIMES DAILY AS NEEDED FOR PAIN   zolpidem 5 MG tablet Commonly known as:  AMBIEN Take 5 mg by mouth at bedtime as needed for sleep.     Known medication allergies: Allergies  Allergen Reactions  . Latex Hives and Shortness Of Breath  . Tylenol [Acetaminophen] Hives  . Codeine Hives    Physical examination: Blood pressure 132/80, pulse 76, resp. rate 19, height 5' 7.5" (1.715 m), weight (!) 381 lb 3.2 oz (172.9 kg), SpO2 99 %.  General: Alert, interactive, in no acute distress. HEENT: PERRLA, TMs pearly gray, turbinates mildly edematous with clear discharge, post-pharynx non erythematous. Neck: Supple without lymphadenopathy. Lungs: Clear to auscultation without wheezing, rhonchi or rales. {no increased work of breathing. CV: Normal S1, S2 without murmurs. Abdomen: Nondistended, nontender. Skin: Dry, mildly hyperpigmented, mildly thickened patches on the Upper eyelids bilaterally, periobitally,  patch on right upper chest. Extremities:  No clubbing, cyanosis or edema. Neuro:   Grossly intact.  Diagnositics/Labs:  Spirometry: FEV1: 2.51L  98%, FVC: 2.76L  89%, ratio consistent with nonobstructive pattern  Assessment and plan: Atopic dermatitis      - will use Elidel apply thin layer twice a day to affected areas on face (eyelids - be careful not to get into eye; around mouth), chest, arms.       - you may try use of moisturizers for sensitive skin (Cetaphil, Vanicream, CeraVe).        - continue Zyrtec for help with itch control  Allergic rhinoconjunctivitis     - continue allergen avoidance measures     - continue Zyrtec as above, Pataday 1 drop each eye as needed for itchy/watery/red eyes, Flonase 2 spray each nostril for nasal congestion/drainage  Asthma, moderate persistent    - at this time not well controlled    - start Symbicort 26mcg 2 puffs twice a day    - have access to albuterol inhaler 2 puffs every 4-6 hours as needed for cough/wheeze/shortness of breath/chest tightness.  May use 15-20 minutes prior to activity.   Monitor frequency of use.    Asthma control goals:   Full participation in all desired activities (may need albuterol before activity)  Albuterol use two time or less a week on average (not counting use with activity)  Cough interfering with sleep two time or less a month  Oral steroids no more than once a year  No hospitalizations  Follow-up 3-4 months or sooner if needed   I appreciate the opportunity to take part in Pasha's care. Please do not hesitate to contact me with questions.  Sincerely,   Prudy Feeler, MD Allergy/Immunology [facility]

## 2017-05-25 ENCOUNTER — Telehealth: Payer: Self-pay | Admitting: Allergy

## 2017-05-25 NOTE — Telephone Encounter (Signed)
Patient was taking PROAIR 6 times a week Patient has been switched to [facility] Patient needs prior auth before pharmacy will fill new script Does patient continue taking the PROAIR until new medication can be received??

## 2017-05-26 NOTE — Telephone Encounter (Signed)
So did she just start the Symbicort today?  She should continue to use albuterol as she needs it.    She may benefit from adding singulair on to plan if she has been taking symbicort (if sample was provided).    Since she has eczema and asthma she will likely benefit from [facility] biologic therapy once it is approved for use in asthma.

## 2017-05-26 NOTE — Telephone Encounter (Signed)
Called patient. Informing her that the PA was completed for her Symbicort she should be able to pick it up. Patient proceed to inform me that she has gotten worse since her visit on 05/22/2017. Patient states she is doing everything that is in her plan and is not feeling any better.  Please advise.

## 2017-05-27 MED ORDER — BUDESONIDE-FORMOTEROL FUMARATE 160-4.5 MCG/ACT IN AERO: 2.0000 | INHALATION_SPRAY | Freq: Two times a day (BID) | RESPIRATORY_TRACT | 5 refills | Status: DC

## 2017-05-27 NOTE — Telephone Encounter (Signed)
Called the [facility] and spoke with Hassan Rowan to find out why there was a 425 charge for the patient for Symbicort. Hassan Rowan told me that Symbicort 80 showed that it still needed a PA. I informed that the PA was completed, approved and faxed over to them yesterday. I looked at the PA approval and heather put in for a Symbicort 160 for the PA instead of the 80. I spoke with Dr. Nelva Bush and she stated I could put a script in for a Symbicort 160. I informed Hassan Rowan from the Pharmacy I was sending in a script for the Symbicort 160 and I put the approval code in the Pharmacy note. I called and spoke with the patient to inform her that we got everything situated and she be able to go and pick up her Symbicort.

## 2017-05-27 NOTE — Telephone Encounter (Signed)
Called patient. I asked Margaret [name] if she was able to get her Symbicort. Patient stated when she went to the pharmacy they told her it would cost 425,which the patient can't afford. I'm going to call the pharmacy to get more information and call her back.

## 2017-09-22 ENCOUNTER — Other Ambulatory Visit: Payer: Self-pay | Admitting: Allergy

## 2017-09-22 DIAGNOSIS — J309 Allergic rhinitis, unspecified: Secondary | ICD-10-CM

## 2017-09-22 DIAGNOSIS — H1010 Acute atopic conjunctivitis, unspecified eye: Secondary | ICD-10-CM

## 2017-09-22 DIAGNOSIS — J4540 Moderate persistent asthma, uncomplicated: Secondary | ICD-10-CM

## 2017-10-12 ENCOUNTER — Other Ambulatory Visit: Payer: Self-pay | Admitting: Allergy

## 2017-10-12 DIAGNOSIS — J4540 Moderate persistent asthma, uncomplicated: Secondary | ICD-10-CM

## 2017-10-16 ENCOUNTER — Other Ambulatory Visit: Payer: Self-pay | Admitting: Allergy

## 2017-10-16 DIAGNOSIS — L2089 Other atopic dermatitis: Secondary | ICD-10-CM

## 2017-10-20 ENCOUNTER — Other Ambulatory Visit: Payer: Self-pay | Admitting: Allergy

## 2017-10-20 DIAGNOSIS — J4540 Moderate persistent asthma, uncomplicated: Secondary | ICD-10-CM

## 2017-10-20 DIAGNOSIS — L2089 Other atopic dermatitis: Secondary | ICD-10-CM

## 2017-10-29 ENCOUNTER — Other Ambulatory Visit: Payer: Self-pay | Admitting: Allergy

## 2017-10-29 DIAGNOSIS — J4540 Moderate persistent asthma, uncomplicated: Secondary | ICD-10-CM

## 2017-11-18 ENCOUNTER — Other Ambulatory Visit: Payer: Self-pay | Admitting: Allergy

## 2017-11-18 DIAGNOSIS — J4540 Moderate persistent asthma, uncomplicated: Secondary | ICD-10-CM

## 2017-11-18 DIAGNOSIS — L2089 Other atopic dermatitis: Secondary | ICD-10-CM

## 2017-11-18 NOTE — Telephone Encounter (Signed)
RF on Proair and Elidel given x 1 with no refills as a courtesy, pt needs a 6 month check-up.

## 2017-11-23 ENCOUNTER — Other Ambulatory Visit: Payer: Self-pay | Admitting: Allergy

## 2018-02-24 ENCOUNTER — Other Ambulatory Visit: Payer: Self-pay | Admitting: Allergy

## 2018-02-24 DIAGNOSIS — H1010 Acute atopic conjunctivitis, unspecified eye: Secondary | ICD-10-CM

## 2018-02-24 DIAGNOSIS — J309 Allergic rhinitis, unspecified: Principal | ICD-10-CM

## 2018-04-09 ENCOUNTER — Emergency Department (HOSPITAL_COMMUNITY): Payer: Medicaid Other

## 2018-04-09 ENCOUNTER — Encounter (HOSPITAL_COMMUNITY): Payer: Self-pay | Admitting: Emergency Medicine

## 2018-04-09 ENCOUNTER — Other Ambulatory Visit: Payer: Self-pay

## 2018-04-09 ENCOUNTER — Inpatient Hospital Stay (HOSPITAL_COMMUNITY)
Admission: EM | Admit: 2018-04-09 | Discharge: 2018-04-11 | DRG: 812 | Disposition: A | Discharge status: Home / Self Care | Payer: Medicaid Other | Attending: Internal Medicine | Admitting: Internal Medicine

## 2018-04-09 DIAGNOSIS — J4520 Mild intermittent asthma, uncomplicated: Secondary | ICD-10-CM | POA: Diagnosis present

## 2018-04-09 DIAGNOSIS — M545 Low back pain: Secondary | ICD-10-CM | POA: Diagnosis present

## 2018-04-09 DIAGNOSIS — R079 Chest pain, unspecified: Secondary | ICD-10-CM | POA: Diagnosis present

## 2018-04-09 DIAGNOSIS — Z886 Allergy status to analgesic agent status: Secondary | ICD-10-CM

## 2018-04-09 DIAGNOSIS — D649 Anemia, unspecified: Secondary | ICD-10-CM | POA: Diagnosis present

## 2018-04-09 DIAGNOSIS — M1990 Unspecified osteoarthritis, unspecified site: Secondary | ICD-10-CM | POA: Diagnosis present

## 2018-04-09 DIAGNOSIS — G43909 Migraine, unspecified, not intractable, without status migrainosus: Secondary | ICD-10-CM | POA: Diagnosis present

## 2018-04-09 DIAGNOSIS — Z885 Allergy status to narcotic agent status: Secondary | ICD-10-CM

## 2018-04-09 DIAGNOSIS — Z6841 Body Mass Index (BMI) 40.0 and over, adult: Secondary | ICD-10-CM

## 2018-04-09 DIAGNOSIS — Z91040 Latex allergy status: Secondary | ICD-10-CM

## 2018-04-09 DIAGNOSIS — Z90710 Acquired absence of both cervix and uterus: Secondary | ICD-10-CM

## 2018-04-09 DIAGNOSIS — F419 Anxiety disorder, unspecified: Secondary | ICD-10-CM | POA: Diagnosis not present

## 2018-04-09 DIAGNOSIS — I10 Essential (primary) hypertension: Secondary | ICD-10-CM

## 2018-04-09 DIAGNOSIS — Z7951 Long term (current) use of inhaled steroids: Secondary | ICD-10-CM

## 2018-04-09 DIAGNOSIS — J45909 Unspecified asthma, uncomplicated: Secondary | ICD-10-CM | POA: Diagnosis present

## 2018-04-09 DIAGNOSIS — Z87891 Personal history of nicotine dependence: Secondary | ICD-10-CM

## 2018-04-09 DIAGNOSIS — D509 Iron deficiency anemia, unspecified: Principal | ICD-10-CM | POA: Diagnosis present

## 2018-04-09 DIAGNOSIS — L309 Dermatitis, unspecified: Secondary | ICD-10-CM | POA: Diagnosis present

## 2018-04-09 LAB — FERRITIN: Ferritin: 6 ng/mL — ABNORMAL LOW (ref 11–307)

## 2018-04-09 LAB — CBC
HCT: 23.7 % — ABNORMAL LOW (ref 36.0–46.0)
Hemoglobin: 6.2 g/dL — CL (ref 12.0–15.0)
MCH: 21.2 pg — ABNORMAL LOW (ref 26.0–34.0)
MCHC: 26.2 g/dL — AB (ref 30.0–36.0)
MCV: 80.9 fL (ref 78.0–100.0)
Platelets: 378 10*3/uL (ref 150–400)
RBC: 2.93 MIL/uL — ABNORMAL LOW (ref 3.87–5.11)
RDW: 19.3 % — ABNORMAL HIGH (ref 11.5–15.5)
WBC: 7.2 10*3/uL (ref 4.0–10.5)

## 2018-04-09 LAB — BASIC METABOLIC PANEL
ANION GAP: 7 (ref 5–15)
BUN: 10 mg/dL (ref 6–20)
CHLORIDE: 112 mmol/L — AB (ref 98–111)
CO2: 25 mmol/L (ref 22–32)
CREATININE: 0.95 mg/dL (ref 0.44–1.00)
Calcium: 8.7 mg/dL — ABNORMAL LOW (ref 8.9–10.3)
GFR calc non Af Amer: >60 mL/min (ref >60)
Glucose, Bld: 94 mg/dL (ref 70–99)
POTASSIUM: 3.9 mmol/L (ref 3.5–5.1)
Sodium: 144 mmol/L (ref 135–145)

## 2018-04-09 LAB — IRON AND TIBC
IRON: 14 ug/dL — AB (ref 28–170)
Saturation Ratios: 4 % — ABNORMAL LOW (ref 10.4–31.8)
TIBC: 346 ug/dL (ref 250–450)
UIBC: 332 ug/dL

## 2018-04-09 LAB — VITAMIN B12: VITAMIN B 12: 196 pg/mL (ref 180–914)

## 2018-04-09 LAB — RETICULOCYTES
RBC.: 2.84 MIL/uL — AB (ref 3.87–5.11)
RETIC COUNT ABSOLUTE: 45.4 10*3/uL (ref 19.0–186.0)
Retic Ct Pct: 1.6 % (ref 0.4–3.1)

## 2018-04-09 LAB — FOLATE: FOLATE: 9.5 ng/mL (ref >5.9)

## 2018-04-09 MED ORDER — ONDANSETRON HCL 4 MG PO TABS: 4.0000 mg | ORAL_TABLET | Freq: Four times a day (QID) | ORAL | Status: DC | PRN

## 2018-04-09 MED ORDER — SUMATRIPTAN SUCCINATE 50 MG PO TABS: 50.0000 mg | ORAL_TABLET | ORAL | Status: DC | PRN

## 2018-04-09 MED ORDER — TIZANIDINE HCL 4 MG PO TABS
4.0000 mg | ORAL_TABLET | Freq: Four times a day (QID) | ORAL | Status: DC | PRN
  Administered 2018-04-10: 4 mg via ORAL
  Filled 2018-04-09: qty 1

## 2018-04-09 MED ORDER — HYDRALAZINE HCL 20 MG/ML IJ SOLN: 5.0000 mg | INTRAMUSCULAR | Status: DC | PRN

## 2018-04-09 MED ORDER — ACETAMINOPHEN 650 MG RE SUPP: 650.0000 mg | Freq: Four times a day (QID) | RECTAL | Status: DC | PRN

## 2018-04-09 MED ORDER — SODIUM CHLORIDE 0.9 % IV SOLN
10.0000 mL/h | Freq: Once | INTRAVENOUS | Status: AC
  Administered 2018-04-10: 10 mL/h via INTRAVENOUS

## 2018-04-09 MED ORDER — MORPHINE SULFATE (PF) 4 MG/ML IV SOLN
2.0000 mg | INTRAVENOUS | Status: DC | PRN
  Administered 2018-04-10 (×3): 2 mg via INTRAVENOUS
  Filled 2018-04-09 (×3): qty 1

## 2018-04-09 MED ORDER — MOMETASONE FURO-FORMOTEROL FUM 200-5 MCG/ACT IN AERO
2.0000 | INHALATION_SPRAY | Freq: Two times a day (BID) | RESPIRATORY_TRACT | Status: DC
  Administered 2018-04-10 – 2018-04-11 (×3): 2 via RESPIRATORY_TRACT
  Filled 2018-04-09: qty 8.8

## 2018-04-09 MED ORDER — SODIUM CHLORIDE 0.9 % IV BOLUS: 1000.0000 mL | Freq: Once | INTRAVENOUS | Status: DC

## 2018-04-09 MED ORDER — TRIAMCINOLONE ACETONIDE 55 MCG/ACT NA AERO
2.0000 | INHALATION_SPRAY | Freq: Every day | NASAL | Status: DC | PRN
  Filled 2018-04-09: qty 21.6

## 2018-04-09 MED ORDER — NITROGLYCERIN 0.4 MG SL SUBL: 0.4000 mg | SUBLINGUAL_TABLET | SUBLINGUAL | Status: DC | PRN

## 2018-04-09 MED ORDER — ALBUTEROL SULFATE (2.5 MG/3ML) 0.083% IN NEBU: 2.5000 mg | INHALATION_SOLUTION | RESPIRATORY_TRACT | Status: DC | PRN

## 2018-04-09 MED ORDER — MONTELUKAST SODIUM 10 MG PO TABS
10.0000 mg | ORAL_TABLET | Freq: Every day | ORAL | Status: DC
Start: 2018-04-09 — End: 2018-04-11
  Administered 2018-04-10 (×2): 10 mg via ORAL
  Filled 2018-04-09 (×2): qty 1

## 2018-04-09 MED ORDER — ZOLPIDEM TARTRATE 5 MG PO TABS
5.0000 mg | ORAL_TABLET | Freq: Every evening | ORAL | Status: DC | PRN
Start: 2018-04-09 — End: 2018-04-11
  Administered 2018-04-10: 5 mg via ORAL
  Filled 2018-04-09: qty 1

## 2018-04-09 MED ORDER — ACETAMINOPHEN 325 MG PO TABS: 650.0000 mg | ORAL_TABLET | Freq: Four times a day (QID) | ORAL | Status: DC | PRN

## 2018-04-09 MED ORDER — SUMATRIPTAN SUCCINATE 50 MG PO TABS
50.0000 mg | ORAL_TABLET | ORAL | Status: DC | PRN
  Filled 2018-04-09: qty 1

## 2018-04-09 MED ORDER — ONDANSETRON HCL 4 MG/2ML IJ SOLN
4.0000 mg | Freq: Four times a day (QID) | INTRAMUSCULAR | Status: DC | PRN
  Administered 2018-04-10 – 2018-04-11 (×2): 4 mg via INTRAVENOUS
  Filled 2018-04-09 (×2): qty 2

## 2018-04-09 NOTE — ED Provider Notes (Signed)
[facility] EMERGENC[facility] DEPARTMENT Provider Note   CSN: [id number] Arrival date & time: 04/09/18  1815     History   Chief Complaint Chief Complaint  Patient presents with  . Shortness of Breath  . Headache    HPI Margaret [name] is a 47 y.o. female.  HPI   Margaret [name] is a 47yo female with a history of iron deficiency anemia, asthma, hypertension, migraines who presents to the emergency department due to recommendation of her PCP for evaluation of shortness of breath and lightheadedness.  Patient reports that she has a long history of iron deficiency anemia since childhood.  She is not able to take p.o. iron due to nausea and intolerance.  States that she was referred by her PCP to hematology/oncology for further management because her last hemoglobin was 6.5.  Patient states that over the past week she has had worsening fatigue, lightheadedness upon standing and feeling as if she is going to pass out.  She also reports shortness of breath on exertion and now feels short of breath at rest.  She reports that she has a metallic taste in her mouth and constantly chews on ice.  Also reports mild right-sided nonradiating dull 4/10 severity intermittent chest pain for the past week.  She has never required iron transfusion or blood transfusion in the past.  She denies fevers, chills, cough, wheezing, congestion, sore throat, syncope, hematochezia, melena, abdominal pain, nausea/vomiting, numbness, weakness. No history of PE/DVT, exogenous estrogen, recent surgery or immobilization, leg swelling or calf tenderness, active cancer.  Past Medical History:  Diagnosis Date  . Allergic rhinitis   . Anxiety diagnosed in 1990  . Arthritis   . Asthma   . Eczema   . Hypertension   . Lower back pain   . Migraine     There are no active problems to display for this patient.   Past Surgical History:  Procedure Laterality Date  . ABDOMINAL HYSTERECTOMY       OB History    None      Home Medications    Prior to Admission medications   Medication Sig Start Date End Date Taking? Authorizing Provider  albuterol (PROVENTIL HFA;VENTOLIN HFA) 108 (90 BASE) MCG/ACT inhaler Inhale 2 puffs into the lungs every 6 (six) hours as needed for wheezing or shortness of breath.    [provider]  beclomethasone (QVAR) 80 MCG/ACT inhaler Qvar 80 mcg/actuation Metered Aerosol oral inhaler  INHALE 2 PUFFS TWICE DAILY TO PREVENT COUGH/WHEEZE INCREASE WITH FLAREUPS AS NEEDED    [provider]  Boric Acid 4 % SOLN boric acid    [provider]  Cetirizine HCl 10 MG CAPS cetirizine 10 mg tablet  TAKE ONE TABLET BY MOUTH EVERY DAY AS NEEDED    [provider]  cetirizine-pseudoephedrine (ZYRTEC-D) 5-120 MG per tablet Take 1 tablet by mouth 2 (two) times daily.    [provider]  CLOBETASOL PROPIONATE EMULSION EX Apply topically.    [provider]  clotrimazole (LOTRIMIN) 1 % cream Apply 1 application topically 2 (two) times daily.    [provider]  cyclobenzaprine (FLEXERIL) 10 MG tablet Take 10 mg by mouth 3 (three) times daily as needed for muscle spasms.    [provider]  diazepam (VALIUM) 5 MG tablet Take 1 tablet (5 mg total) by mouth every 6 (six) hours as needed for muscle spasms. 07/29/13   Sherwood Gambler, MD  diclofenac sodium (VOLTAREN) 1 % GEL Voltaren 1 %  topical gel  APPLY FOUR grams FOUR TIMES DAILY AS NEEDED FOR PAIN    [provider]  ELIDEL 1 % cream APPLY TOPICALLY TO THE AFFECTED AREA(S) TWICE DAILY 11/18/17   Kennith Gain, MD  ergocalciferol (VITAMIN D2) 50000 UNITS capsule Take 50,000 Units by mouth once a week.    [provider]  fluconazole (DIFLUCAN) 100 MG tablet fluconazole 100 mg tablet  TAKE ONE TABLET BY MOUTH NOW & REPEAT IN 3D    [provider]  fluconazole (DIFLUCAN) 150 MG tablet Diflucan 150 mg tablet  take 1 tab po now and  repeat in 3 days    [provider]  fluticasone (FLONASE) 50 MCG/ACT nasal spray INSTILL TWO SPRAYS IN EACH NOSTRIL DAILY AS NEEDED allergies OR RHINITIS 09/22/17   Kennith Gain, MD  ibuprofen (ADVIL,MOTRIN) 800 MG tablet Take 800 mg by mouth every 8 (eight) hours as needed for moderate pain.    [provider]  lisinopril-hydrochlorothiazide (PRINZIDE,ZESTORETIC) 10-12.5 MG per tablet Take 1 tablet by mouth daily.    [provider]  lisinopril-hydrochlorothiazide (PRINZIDE,ZESTORETIC) 20-12.5 MG tablet Take 1 tablet by mouth daily. 04/27/17   [provider]  methocarbamol (ROBAXIN) 500 MG tablet methocarbamol 500 mg tablet  TAKE ONE TABLET BY MOUTH TWICE DAILY AS NEEDED    [provider]  montelukast (SINGULAIR) 10 MG tablet TAKE ONE TABLET BY MOUTH at bedtime 09/22/17   Kennith Gain, MD  nystatin (MYCOSTATIN/NYSTOP) 100000 UNIT/GM POWD Apply topically.    [provider]  nystatin cream (MYCOSTATIN) APPLY SMALL AMOUNT TO THE EXTERNAL AFFECTED AREA BY TOPICAL ROUTE TWO TIMES PER DAY AS NEEDED 02/13/17   [provider]  PATADAY 0.2 % SOLN APPLY ONE DROP IN THE AFFECTED EYE(S) AS NEEDED 02/24/18   Bobbitt, [place] Muta, MD  PROAIR HFA 108 [phone] Base) MCG/ACT inhaler INHALE TWO puffs BY MOUTH into the lungs EVERY 4 HOURS AS NEEDED FOR WHEEZING OR shortness of breath 11/18/17   Kennith Gain, MD  SYMBICORT 160-4.5 MCG/ACT inhaler INHALE TWO PUFFS BY MOUTH TWICE DAILY 11/23/17   Kennith Gain, MD  tiZANidine (ZANAFLEX) 4 MG tablet Take 4 mg by mouth every 6 (six) hours as needed for muscle spasms.    [provider]  tobramycin-dexamethasone [facility]) ophthalmic ointment TobraDex 0.3 %-0.1 % eye ointment  1 application twice a day    [provider]  triamcinolone (NASACORT) 55 MCG/ACT AERO nasal inhaler Place 2 sprays into the nose daily.    [provider]  valACYclovir  (VALTREX) 500 MG tablet Take 500 mg by mouth 2 (two) times daily.    [provider]  zolpidem (AMBIEN) 5 MG tablet Take 5 mg by mouth at bedtime as needed for sleep.    [provider]    Family History No family history on file.  Social History Social History   Tobacco Use  . Smoking status: Former Smoker    Last attempt to quit: 07/30/1999    Years since quitting: 18.7  . Smokeless tobacco: Never Used  Substance Use Topics  . Alcohol use: No  . Drug use: No     Allergies   Latex; Tylenol [acetaminophen]; and Codeine   Review of Systems Review of Systems  Constitutional: Negative for chills and fever.  HENT: Negative for congestion, rhinorrhea and sore throat.   Eyes: Negative for visual disturbance.  Respiratory: Positive for shortness of breath. Negative for cough and wheezing.   Cardiovascular: Positive for  chest pain. Negative for leg swelling.  Gastrointestinal: Negative for abdominal pain, nausea and vomiting.  Genitourinary: Negative for difficulty urinating, dysuria and flank pain.  Musculoskeletal: Negative for back pain and gait problem.  Skin: Negative for color change.  Neurological: Positive for dizziness and light-headedness. Negative for syncope, weakness and numbness.     Physical Exam Updated Vital Signs BP 140/68 (BP Location: Left Arm)   Pulse 92   Temp 98.4 F (36.9 C) (Oral)   Resp 18   Ht 5\' 2"  (1.575 m)   Wt (!) 172.4 kg (380 lb)   SpO2 100%   BMI 69.50 kg/m   Physical Exam  Constitutional: She appears well-developed and well-nourished. No distress.  No acute distress, nontoxic-appearing.  HENT:  Head: Normocephalic and atraumatic.  Mouth/Throat: Oropharynx is clear and moist. No oropharyngeal exudate.  Eyes: Pupils are equal, round, and reactive to light. Right eye exhibits no discharge. Left eye exhibits no discharge.  Conjunctival pallor bilaterally.  Neck: Normal range of motion.  Cardiovascular: Normal rate,  regular rhythm and intact distal pulses.  Pulmonary/Chest: Effort normal and breath sounds normal. No stridor. No respiratory distress. She has no wheezes. She has no rales.  Abdominal: Soft. Bowel sounds are normal. There is no tenderness.  Musculoskeletal:  No pitting edema lower extremities.  Neurological: She is alert. Coordination normal.  Skin: She is not diaphoretic.  Psychiatric: She has a normal mood and affect. Her behavior is normal.  Nursing note and vitals reviewed.    ED Treatments / Results  Labs (all labs ordered are listed, but only abnormal results are displayed) Labs Reviewed  CBC - Abnormal; Notable for the following components:      Result Value   RBC 2.93 (*)    Hemoglobin 6.2 (*)    HCT 23.7 (*)    MCH 21.2 (*)    MCHC 26.2 (*)    RDW 19.3 (*)    All other components within normal limits  BASIC METABOLIC PANEL - Abnormal; Notable for the following components:   Chloride 112 (*)    Calcium 8.7 (*)    All other components within normal limits  IRON AND TIBC - Abnormal; Notable for the following components:   Iron 14 (*)    Saturation Ratios 4 (*)    All other components within normal limits  FERRITIN - Abnormal; Notable for the following components:   Ferritin 6 (*)    All other components within normal limits  RETICULOCYTES - Abnormal; Notable for the following components:   RBC. 2.84 (*)    All other components within normal limits  VITAMIN B12  FOLATE  HEMOGLOBIN A1C  LIPID PANEL  TROPONIN I  TROPONIN I  TROPONIN I  RAPID URINE DRUG SCREEN, HOSP PERFORMED  BASIC METABOLIC PANEL  CBC  HIV ANTIBODY (ROUTINE TESTING)  TYPE AND SCREEN  PREPARE RBC (CROSSMATCH)    EKG EKG Interpretation  Date/Time:  Friday April 09 2018 18:47:03 EDT Ventricular Rate:  85 PR Interval:  146 QRS Duration: 84 QT Interval:  376 QTC Calculation: 447 R Axis:   66 Text Interpretation:  Normal sinus rhythm Possible Left atrial enlargement Borderline ECG When  compared with ECG of 07/29/2013, No significant change was found Confirmed by Delora Fuel ([id number]) on 04/09/2018 10:47:32 PM   Radiology Dg Chest 2 View  Result Date: 04/09/2018 CLINICAL DATA:  Headache and shortness of breath. EXAM: CHEST - 2 VIEW COMPARISON:  August 09, 2010 FINDINGS: Cardiomegaly. The hila and mediastinum normal.  No pulmonary nodules, masses, or focal infiltrates. No overt edema. IMPRESSION: Cardiomegaly, mildly increased since 2011. No acute abnormalities otherwise seen. Electronically Signed   By: Dorise Bullion III M.D   On: 04/09/2018 19:17    Procedures Procedures (including critical care time)  Medications Ordered in ED Medications  0.9 %  sodium chloride infusion (has no administration in time range)  albuterol (PROVENTIL) (2.5 MG/3ML) 0.083% nebulizer solution 2.5 mg (has no administration in time range)  sodium chloride 0.9 % bolus 1,000 mL (1,000 mLs Intravenous Transfusing/Transfer 04/09/18 2326)  nitroGLYCERIN (NITROSTAT) SL tablet 0.4 mg (has no administration in time range)  morphine 4 MG/ML injection 2 mg (has no administration in time range)  ondansetron (ZOFRAN) tablet 4 mg (has no administration in time range)    Or  ondansetron (ZOFRAN) injection 4 mg (has no administration in time range)  hydrALAZINE (APRESOLINE) injection 5 mg (has no administration in time range)  SUMAtriptan (IMITREX) tablet 50 mg (has no administration in time range)  montelukast (SINGULAIR) tablet 10 mg (has no administration in time range)  mometasone-formoterol (DULERA) 200-5 MCG/ACT inhaler 2 puff (has no administration in time range)  triamcinolone (NASACORT) nasal inhaler 2 spray (has no administration in time range)  zolpidem (AMBIEN) tablet 5 mg (has no administration in time range)  tiZANidine (ZANAFLEX) tablet 4 mg (has no administration in time range)     Initial Impression / Assessment and Plan / ED Course  I have reviewed the triage vital signs and the nursing  notes.  Pertinent labs & imaging results that were available during my care of the patient were reviewed by me and considered in my medical decision making (see chart for details).     Patient will be admitted by Hospitalist Dr. Blaine Hamper for symptomatic anemia. She is hemodynamically stable. Hemoglobin 6.2.  BMP without any major electrolyte abnormalities.  She denies any abdominal pain or rectal bleeding.  Chest x-ray reveals cardiomegaly, mildly increased since 2011.  EKG without arrhythmia or acute abnormality.  Blood transfusion ordered. Patient informed and agrees with admission plan.   Final Clinical Impressions(s) / ED Diagnoses   Final diagnoses:  Anxiety  Mild intermittent asthma, unspecified whether complicated  Essential hypertension    ED Discharge Orders    None       Bernarda Caffey 04/09/18 2352    Pattricia Boss, MD 04/10/18 [phone]

## 2018-04-09 NOTE — H&P (Addendum)
History and Physical    DRUCELLA KARBOWSKI EHU:[id number] DOB: 10/16/1970 DOA: 04/09/2018  Referring MD/NP/PA:   PCP: Nolene Ebbs, MD   Patient coming from:  The patient is coming from home.  At baseline, pt is independent for most of ADL.      Chief Complaint: Shortness of breath, lightheadedness, generalized weakness, dizziness, chest pain  HPI: Margaret [name] is a 47 y.o. female with medical history significant of hypertension, asthma, anxiety, migraine headaches, iron deficiency anemia, obesity, eczema, obesity, who presents with shortness of breath, lightheadedness, dizziness, generalized weakness and chest pain.  Patient states that she has long history of iron deficiency anemia.  She used to have heavy menstrual period and had hysterectomy due to it.  She is supposed to take iron supplement, but she did not do so for long time because it tasted bad. She states that she has been having shortness of breath, generalized weakness, dizziness and lightheadedness for about 2 weeks, which has worsened in the past several days.  She does not have unilateral weakness, numbness or tingling in extremities.  No facial droop or slurred speech.  She has headache sometimes.  She also reports intermittent chest pain, which is located in substernal area, mild, 4 out of 10 severity, nonradiating.  No recent long distance traveling.  No tenderness in the calf areas.  Patient does not have cough, fever or chills.  She has nausea, no vomiting, diarrhea or abdominal pain, but states that she has loose stool bowel movement sometimes.  She does not have dark stool, hematochezia or hematemesis.  No family history of colon cancer.  ED Course: pt was found to have hemoglobin dropped from 9.8 on 02/27/11-6.2 today, electrolytes renal function okay, temperature normal, heart rate in 90s, no tachypnea, oxygen saturation 100% on room air.  Patient is placed on telemetry bed for observation.  Review of Systems:    General: no fevers, chills, no body weight gain, has fatigue HEENT: no blurry vision, hearing changes or sore throat Respiratory: has dyspnea, no coughing, wheezing CV: has chest pain, no palpitations GI: has nausea, no vomiting, abdominal pain, diarrhea, constipation GU: no dysuria, burning on urination, increased urinary frequency, hematuria  Ext: no leg edema Neuro: no unilateral weakness, numbness, or tingling, no vision change or hearing loss Skin: no rash, no skin tear. MSK: No muscle spasm, no deformity, no limitation of range of movement in spin Heme: No easy bruising.  Travel history: No recent long distant travel.  Allergy:  Allergies  Allergen Reactions  . Latex Hives and Shortness Of Breath  . Tylenol [Acetaminophen] Hives  . Codeine Hives    Past Medical History:  Diagnosis Date  . Allergic rhinitis   . Anxiety diagnosed in 1990  . Arthritis   . Asthma   . Eczema   . Hypertension   . Lower back pain   . Migraine     Past Surgical History:  Procedure Laterality Date  . ABDOMINAL HYSTERECTOMY      Social History:  reports that she quit smoking about 18 years ago. She has never used smokeless tobacco. She reports that she does not drink alcohol or use drugs.  Family History: No family history on file.   Prior to Admission medications   Medication Sig Start Date End Date Taking? Authorizing Provider  albuterol (PROVENTIL HFA;VENTOLIN HFA) 108 (90 BASE) MCG/ACT inhaler Inhale 2 puffs into the lungs every 6 (six) hours as needed for wheezing or shortness of breath.  [provider]  beclomethasone (QVAR) 80 MCG/ACT inhaler Qvar 80 mcg/actuation Metered Aerosol oral inhaler  INHALE 2 PUFFS TWICE DAILY TO PREVENT COUGH/WHEEZE INCREASE WITH FLAREUPS AS NEEDED    [provider]  Boric Acid 4 % SOLN boric acid    [provider]  Cetirizine HCl 10 MG CAPS cetirizine 10 mg tablet  TAKE ONE TABLET BY MOUTH EVERY DAY AS NEEDED     [provider]  cetirizine-pseudoephedrine (ZYRTEC-D) 5-120 MG per tablet Take 1 tablet by mouth 2 (two) times daily.    [provider]  CLOBETASOL PROPIONATE EMULSION EX Apply topically.    [provider]  clotrimazole (LOTRIMIN) 1 % cream Apply 1 application topically 2 (two) times daily.    [provider]  cyclobenzaprine (FLEXERIL) 10 MG tablet Take 10 mg by mouth 3 (three) times daily as needed for muscle spasms.    [provider]  diazepam (VALIUM) 5 MG tablet Take 1 tablet (5 mg total) by mouth every 6 (six) hours as needed for muscle spasms. 07/29/13   Sherwood Gambler, MD  diclofenac sodium (VOLTAREN) 1 % GEL Voltaren 1 % topical gel  APPLY FOUR grams FOUR TIMES DAILY AS NEEDED FOR PAIN    [provider]  ELIDEL 1 % cream APPLY TOPICALLY TO THE AFFECTED AREA(S) TWICE DAILY 11/18/17   Kennith Gain, MD  ergocalciferol (VITAMIN D2) 50000 UNITS capsule Take 50,000 Units by mouth once a week.    [provider]  fluconazole (DIFLUCAN) 100 MG tablet fluconazole 100 mg tablet  TAKE ONE TABLET BY MOUTH NOW & REPEAT IN 3D    [provider]  fluconazole (DIFLUCAN) 150 MG tablet Diflucan 150 mg tablet  take 1 tab po now and repeat in 3 days    [provider]  fluticasone (FLONASE) 50 MCG/ACT nasal spray INSTILL TWO SPRAYS IN EACH NOSTRIL DAILY AS NEEDED allergies OR RHINITIS 09/22/17   Kennith Gain, MD  ibuprofen (ADVIL,MOTRIN) 800 MG tablet Take 800 mg by mouth every 8 (eight) hours as needed for moderate pain.    [provider]  lisinopril-hydrochlorothiazide (PRINZIDE,ZESTORETIC) 10-12.5 MG per tablet Take 1 tablet by mouth daily.    [provider]  lisinopril-hydrochlorothiazide (PRINZIDE,ZESTORETIC) 20-12.5 MG tablet Take 1 tablet by mouth daily. 04/27/17   [provider]  methocarbamol (ROBAXIN) 500 MG tablet methocarbamol 500 mg tablet  TAKE ONE TABLET BY  MOUTH TWICE DAILY AS NEEDED    [provider]  montelukast (SINGULAIR) 10 MG tablet TAKE ONE TABLET BY MOUTH at bedtime 09/22/17   Kennith Gain, MD  nystatin (MYCOSTATIN/NYSTOP) 100000 UNIT/GM POWD Apply topically.    [provider]  nystatin cream (MYCOSTATIN) APPLY SMALL AMOUNT TO THE EXTERNAL AFFECTED AREA BY TOPICAL ROUTE TWO TIMES PER DAY AS NEEDED 02/13/17   [provider]  PATADAY 0.2 % SOLN APPLY ONE DROP IN THE AFFECTED EYE(S) AS NEEDED 02/24/18   Bobbitt, [place] Muta, MD  PROAIR HFA 108 [phone] Base) MCG/ACT inhaler INHALE TWO puffs BY MOUTH into the lungs EVERY 4 HOURS AS NEEDED FOR WHEEZING OR shortness of breath 11/18/17   Kennith Gain, MD  SYMBICORT 160-4.5 MCG/ACT inhaler INHALE TWO PUFFS BY MOUTH TWICE DAILY 11/23/17   Kennith Gain, MD  tiZANidine (ZANAFLEX) 4 MG tablet Take 4 mg by mouth every 6 (six) hours as needed for muscle spasms.    [provider]  tobramycin-dexamethasone [facility]) ophthalmic ointment TobraDex 0.3 %-0.1 % eye ointment  1 application twice a day    [provider]  triamcinolone (NASACORT) 55 MCG/ACT AERO nasal inhaler Place 2 sprays into the nose daily.    [provider]  valACYclovir (VALTREX) 500 MG tablet Take 500 mg by mouth 2 (two) times daily.    [provider]  zolpidem (AMBIEN) 5 MG tablet Take 5 mg by mouth at bedtime as needed for sleep.    [provider]    Physical Exam: Vitals:   04/09/18 1824 04/09/18 1830 04/09/18 1840 04/09/18 2322  BP:   140/68 (!) 155/81  Pulse:   92 73  Resp:   18 20  Temp:   98.4 F (36.9 C)   TempSrc:   Oral   SpO2: 100%  100% 100%  Weight:  (!) 172.4 kg (380 lb)    Height:  5\' 2"  (1.575 m)     General: Not in acute distress. Pale looking. HEENT:       Eyes: PERRL, EOMI, no scleral icterus.       ENT: No discharge from the ears and nose, no pharynx injection, no tonsillar enlargement.        Neck: No  JVD, no bruit, no mass felt. Heme: No neck lymph node enlargement. Cardiac: S1/S2, RRR, No murmurs, No gallops or rubs. Respiratory:  No rales, wheezing, rhonchi or rubs. GI: Soft, nondistended, nontender, no rebound pain, no organomegaly, BS present. GU: No hematuria Ext: No pitting leg edema bilaterally. 2+DP/PT pulse bilaterally. Musculoskeletal: No joint deformities, No joint redness or warmth, no limitation of ROM in spin. Skin: No rashes.  Neuro: Alert, oriented X3, cranial nerves II-XII grossly intact, moves all extremities normally.  Psych: Patient is not psychotic, no suicidal or hemocidal ideation.  Labs on Admission: I have personally reviewed following labs and imaging studies  CBC: Recent Labs  Lab 04/09/18 1940  WBC 7.2  HGB 6.2*  HCT 23.7*  MCV 80.9  PLT 161   Basic Metabolic Panel: Recent Labs  Lab 04/09/18 1940  NA 144  K 3.9  CL 112*  CO2 25  GLUCOSE 94  BUN 10  CREATININE 0.95  CALCIUM 8.7*   GFR: Estimated Creatinine Clearance: 113.2 mL/min (by C-G formula based on SCr of 0.95 mg/dL). Liver Function Tests: No results for input(s): AST, ALT, ALKPHOS, BILITOT, PROT, ALBUMIN in the last 168 hours. No results for input(s): LIPASE, AMYLASE in the last 168 hours. No results for input(s): AMMONIA in the last 168 hours. Coagulation Profile: No results for input(s): INR, PROTIME in the last 168 hours. Cardiac Enzymes: No results for input(s): CKTOTAL, CKMB, CKMBINDEX, TROPONINI in the last 168 hours. BNP (last 3 results) No results for input(s): PROBNP in the last 8760 hours. HbA1C: No results for input(s): HGBA1C in the last 72 hours. CBG: No results for input(s): GLUCAP in the last 168 hours. Lipid Profile: No results for input(s): CHOL, HDL, LDLCALC, TRIG, CHOLHDL, LDLDIRECT in the last 72 hours. Thyroid Function Tests: No results for input(s): TSH, T4TOTAL, FREET4, T3FREE, THYROIDAB in the last 72 hours. Anemia Panel: Recent Labs     04/09/18 2149 04/09/18 2302  VITAMINB12 196  --   FOLATE 9.5  --   FERRITIN 6*  --   TIBC 346  --   IRON 14*  --   RETICCTPCT  --  1.6   Urine analysis:    Component Value Date/Time   COLORURINE RED BIOCHEMICALS MAY BE AFFECTED BY COLOR (A) 01/06/2011 1755   APPEARANCEUR CLOUDY (A) 01/06/2011  1755   LABSPEC >1.030 (H) 01/06/2011 1755   PHURINE 5.0 01/06/2011 1755   GLUCOSEU NEGATIVE 01/06/2011 1755   HGBUR LARGE (A) 01/06/2011 [place] 01/06/2011 [place] 01/06/2011 1755   PROTEINUR 30 (A) 01/06/2011 1755   UROBILINOGEN 0.2 01/06/2011 1755   NITRITE NEGATIVE 01/06/2011 1755   LEUKOCYTESUR TRACE (A) 01/06/2011 1755   Sepsis Labs: @LABRCNTIP (procalcitonin:4,lacticidven:4) )No results found for this or any previous visit (from the past 240 hour(s)).   Radiological Exams on Admission: Dg Chest 2 View  Result Date: 04/09/2018 CLINICAL DATA:  Headache and shortness of breath. EXAM: CHEST - 2 VIEW COMPARISON:  August 09, 2010 FINDINGS: Cardiomegaly. The hila and mediastinum normal. No pulmonary nodules, masses, or focal infiltrates. No overt edema. IMPRESSION: Cardiomegaly, mildly increased since 2011. No acute abnormalities otherwise seen. Electronically Signed   By: Dorise Bullion III M.D   On: 04/09/2018 19:17     EKG: Independently reviewed.  Sinus rhythm, QTC 447, early R wave progression   Assessment/Plan Principal Problem:   Symptomatic anemia Active Problems:   Anxiety   Asthma   Hypertension   Migraine   Chest pain   Symptomatic anemia: Hemoglobin 6.2.  Hemodynamically stable.  Patient is s/p of hysterectomy.  Denies rectal bleeding.  Unclear etiology for anemia.  Denies family history of colon cancer.  Patient is 47 year old, will need colonoscopy and EGD to rule out GI blood loss.  -will place on tele bed for obs -transfuse 1U of blood -check FOBT -anemia panel -IVF: 1L NS now  Addendum: anemia panel showed iron  deficiency -will give one dose of Feraheme, 510 mg  Chest pain: Patient is mild intermittent chest pain.  Chest x-ray showed cardiomegaly, but no infiltration.  Likely due to demand ischemia secondary to anemia. - cycle CE q6 x3 and repeat EKG in the am  - prn Nitroglycerin, Morphine - Risk factor stratification: will check FLP, UDS and A1C  - 2d echo - did not order 2d echo--> please reevaluate pt in AM to decide if pt needs 2D echo.  Anxiety:  -prn Xanax  Asthma: stable -PRN albuterol nebulizers -Singulair, Dulera inhaler  Hypertension: Pressure 140/68.  Not taking medications at home -IV hydralazine.  Migraine -prn sumatriptan   DVT ppx: SCD Code Status: Full code Family Communication: None at bed side.   Disposition Plan:  Anticipate discharge back to previous home environment Consults called:  none Admission status: Obs / tele      Date of Service 04/09/2018    Ivor Costa [facility] Pager [phone]  If 7PM-7AM, please contact night-coverage [email or web address] Password [facility] 04/09/2018, 11:47 PM

## 2018-04-09 NOTE — ED Triage Notes (Signed)
Pt arrives to ED from home with complaints of HA and SOB since a week ago, worsening in the last 24 hrs. EMS reports pt called here PCP and he told her to come to ED. Pt has hx of low iron. Pt reports she has SOB when ambulating. Lung sounds clear. Pt placed in position of comfort with bed locked and lowered.

## 2018-04-10 DIAGNOSIS — L309 Dermatitis, unspecified: Secondary | ICD-10-CM | POA: Diagnosis present

## 2018-04-10 DIAGNOSIS — I10 Essential (primary) hypertension: Secondary | ICD-10-CM | POA: Diagnosis present

## 2018-04-10 DIAGNOSIS — Z7951 Long term (current) use of inhaled steroids: Secondary | ICD-10-CM | POA: Diagnosis not present

## 2018-04-10 DIAGNOSIS — R0602 Shortness of breath: Secondary | ICD-10-CM | POA: Diagnosis present

## 2018-04-10 DIAGNOSIS — Z91040 Latex allergy status: Secondary | ICD-10-CM | POA: Diagnosis not present

## 2018-04-10 DIAGNOSIS — Z6841 Body Mass Index (BMI) 40.0 and over, adult: Secondary | ICD-10-CM | POA: Diagnosis not present

## 2018-04-10 DIAGNOSIS — J4520 Mild intermittent asthma, uncomplicated: Secondary | ICD-10-CM | POA: Diagnosis present

## 2018-04-10 DIAGNOSIS — M545 Low back pain: Secondary | ICD-10-CM | POA: Diagnosis present

## 2018-04-10 DIAGNOSIS — Z87891 Personal history of nicotine dependence: Secondary | ICD-10-CM | POA: Diagnosis not present

## 2018-04-10 DIAGNOSIS — Z886 Allergy status to analgesic agent status: Secondary | ICD-10-CM | POA: Diagnosis not present

## 2018-04-10 DIAGNOSIS — G43909 Migraine, unspecified, not intractable, without status migrainosus: Secondary | ICD-10-CM | POA: Diagnosis present

## 2018-04-10 DIAGNOSIS — M1990 Unspecified osteoarthritis, unspecified site: Secondary | ICD-10-CM | POA: Diagnosis present

## 2018-04-10 DIAGNOSIS — Z90710 Acquired absence of both cervix and uterus: Secondary | ICD-10-CM | POA: Diagnosis not present

## 2018-04-10 DIAGNOSIS — Z885 Allergy status to narcotic agent status: Secondary | ICD-10-CM | POA: Diagnosis not present

## 2018-04-10 DIAGNOSIS — D649 Anemia, unspecified: Secondary | ICD-10-CM | POA: Diagnosis not present

## 2018-04-10 DIAGNOSIS — D509 Iron deficiency anemia, unspecified: Secondary | ICD-10-CM | POA: Diagnosis present

## 2018-04-10 DIAGNOSIS — F419 Anxiety disorder, unspecified: Secondary | ICD-10-CM | POA: Diagnosis present

## 2018-04-10 LAB — CBC
HCT: 27.3 % — ABNORMAL LOW (ref 36.0–46.0)
Hemoglobin: 7.8 g/dL — ABNORMAL LOW (ref 12.0–15.0)
MCH: 23.8 pg — ABNORMAL LOW (ref 26.0–34.0)
MCHC: 28.6 g/dL — ABNORMAL LOW (ref 30.0–36.0)
MCV: 83.2 fL (ref 78.0–100.0)
Platelets: 325 10*3/uL (ref 150–400)
RBC: 3.28 MIL/uL — ABNORMAL LOW (ref 3.87–5.11)
RDW: 19.2 % — ABNORMAL HIGH (ref 11.5–15.5)
WBC: 7.8 10*3/uL (ref 4.0–10.5)

## 2018-04-10 LAB — RAPID URINE DRUG SCREEN, HOSP PERFORMED
Amphetamines: NOT DETECTED
Barbiturates: NOT DETECTED
Benzodiazepines: POSITIVE — AB
Cocaine: NOT DETECTED
OPIATES: POSITIVE — AB
TETRAHYDROCANNABINOL: POSITIVE — AB

## 2018-04-10 LAB — PREPARE RBC (CROSSMATCH)

## 2018-04-10 LAB — HEMOGLOBIN A1C
Hgb A1c MFr Bld: 5.4 % (ref 4.8–5.6)
MEAN PLASMA GLUCOSE: 108.28 mg/dL

## 2018-04-10 LAB — BASIC METABOLIC PANEL
ANION GAP: 8 (ref 5–15)
BUN: 10 mg/dL (ref 6–20)
CALCIUM: 8.3 mg/dL — AB (ref 8.9–10.3)
CO2: 26 mmol/L (ref 22–32)
Chloride: 109 mmol/L (ref 98–111)
Creatinine, Ser: 0.96 mg/dL (ref 0.44–1.00)
GFR calc non Af Amer: >60 mL/min (ref >60)
Glucose, Bld: 99 mg/dL (ref 70–99)
POTASSIUM: 3.7 mmol/L (ref 3.5–5.1)
SODIUM: 143 mmol/L (ref 135–145)

## 2018-04-10 LAB — HEMOGLOBIN AND HEMATOCRIT, BLOOD
HCT: 23.8 % — ABNORMAL LOW (ref 36.0–46.0)
Hemoglobin: 6.5 g/dL — CL (ref 12.0–15.0)

## 2018-04-10 LAB — LIPID PANEL
CHOLESTEROL: 116 mg/dL (ref 0–200)
HDL: 39 mg/dL — ABNORMAL LOW (ref >40)
LDL CALC: 64 mg/dL (ref 0–99)
Total CHOL/HDL Ratio: 3 RATIO
Triglycerides: 67 mg/dL (ref <150)
VLDL: 13 mg/dL (ref 0–40)

## 2018-04-10 LAB — TROPONIN I
Troponin I: <0.03 ng/mL (ref <0.03)
Troponin I: <0.03 ng/mL (ref <0.03)

## 2018-04-10 LAB — HIV ANTIBODY (ROUTINE TESTING W REFLEX): HIV SCREEN 4TH GENERATION: NONREACTIVE

## 2018-04-10 MED ORDER — SODIUM CHLORIDE 0.9% IV SOLUTION
Freq: Once | INTRAVENOUS | Status: AC
  Administered 2018-04-10: 13:00:00 via INTRAVENOUS

## 2018-04-10 MED ORDER — ALPRAZOLAM 0.25 MG PO TABS: 0.2500 mg | ORAL_TABLET | Freq: Two times a day (BID) | ORAL | Status: DC | PRN

## 2018-04-10 MED ORDER — FERROUS SULFATE 325 (65 FE) MG PO TABS
325.0000 mg | ORAL_TABLET | Freq: Two times a day (BID) | ORAL | Status: DC
  Administered 2018-04-11: 325 mg via ORAL
  Filled 2018-04-10 (×2): qty 1

## 2018-04-10 MED ORDER — SODIUM CHLORIDE 0.9 % IV SOLN
510.0000 mg | Freq: Once | INTRAVENOUS | Status: AC
  Administered 2018-04-10: 510 mg via INTRAVENOUS
  Filled 2018-04-10: qty 17

## 2018-04-10 NOTE — Plan of Care (Signed)
°  Problem: Clinical Measurements: °Goal: Ability to maintain clinical measurements within normal limits will improve °Outcome: Progressing °  °Problem: Clinical Measurements: °Goal: Diagnostic test results will improve °Outcome: Progressing °  °

## 2018-04-10 NOTE — Progress Notes (Signed)
Called phlebotomy regarding H&H scheduled for 0730 and it has not been drawn yet  Phlebotomy aware and sending technician to draw now

## 2018-04-10 NOTE — Progress Notes (Signed)
Pt cannot take ferrous sulfate 325mg , she stated it makes her sick  Informed MD

## 2018-04-10 NOTE — Progress Notes (Signed)
PROGRESS NOTE    Margaret [name]  RDE:[id number] DOB: Nov 15, 1970 DOA: 04/09/2018 PCP: Margaret Ebbs, MD  Outpatient Specialists:     Brief Narrative:  Margaret [name] is a 47 y.o. female with medical history significant of hypertension, asthma, anxiety, migraine headaches, iron deficiency anemia, obesity, eczema, obesity, who presents with shortness of breath, lightheadedness, dizziness, generalized weakness and chest pain.  Patient has been transfused with 1 unit of packed red blood cell.  Hemoglobin after the transfusion came up to 6.5 g/dL.  Will transfuse patient with an mandate of packed red blood cells.  Patient has received 510 mg of Feraheme IV.  Will likely need repeat IV Feraheme in the next 3 to 8 days.  Patient is not keen on iron pills.  Assessment & Plan:   Principal Problem:   Symptomatic anemia Active Problems:   Anxiety   Asthma   Hypertension   Migraine   Chest pain  Symptomatic anemia:  Hemoglobin 6.2.  Hemodynamically stable.  Patient is s/p of hysterectomy.  Denies rectal bleeding.  Unclear etiology for anemia.  Denies family history of colon cancer.  Patient is 47 year old, will need colonoscopy and EGD to rule out GI blood loss. 04/10/2018: -Hemoglobin of 6.5 g/dL after 1 unit of packed red blood cells -Transfuse 1 more unit of packed red blood cells -CBC in a.m. -Patient has been infused with 510 mg of Feraheme. -Likely repeat IV Feraheme 510 mg in 3 to 8 days -Patient is not keen on oral iron supplements -Patient may be maintained on IV iron -Patient has been advised to avoid ice craving -Colonoscopy on discharge. -Further management will depend on hospital course.  Iron deficiency: Kindly see above.  Chest pain: Patient is mild intermittent chest pain.  Chest x-ray showed cardiomegaly, but no infiltration.  Likely due to demand ischemia secondary to anemia. - cycle CE q6 x3 and repeat EKG in the am  - prn Nitroglycerin, Morphine - Risk  factor stratification: will check FLP, UDS and A1C  - 2d echo - did not order 2d echo--> please reevaluate pt in AM to decide if pt needs 2D echo. 04/10/2018: Chest pain is likely secondary to severe iron deficiency anemia.  Anxiety:  -prn Xanax  Asthma: stable -PRN albuterol nebulizers -Singulair, Dulera inhaler  Hypertension: Pressure 140/68.  Not taking medications at home -IV hydralazine.  Migraine -prn sumatriptan  Morbid obesity: Diet and exercise.   DVT ppx: SCD Code Status: Full code Family Communication: None at bed side.   Disposition Plan:  Anticipate discharge back to previous home environment   Consultants:   None  Procedures:   None  Antimicrobials:   None   Subjective: Chest pain has resolved. Shortness of breath has resolved.  Objective: Vitals:   04/10/18 0518 04/10/18 1215 04/10/18 1250 04/10/18 1541  BP: (!) 109/49 116/64 119/60 140/81  Pulse: 79 80 71 70  Resp: 13 20 17 18   Temp: 98.4 F (36.9 C) 98.3 F (36.8 C) 98.6 F (37 C) (!) 97.5 F (36.4 C)  TempSrc: Oral Oral Oral Oral  SpO2: 100% 100% 100% 100%  Weight:      Height:        Intake/Output Summary (Last 24 hours) at 04/10/2018 1609 Last data filed at 04/10/2018 1542 Gross per 24 hour  Intake 2056.5 ml  Output -  Net 2056.5 ml   Filed Weights   04/09/18 1830 04/09/18 2355  Weight: (!) 172.4 kg (380 lb) (!) 171.2 kg (377 lb 8 oz)  Examination:  General exam: Morbidly obese.  Appears calm and comfortable  Respiratory system: Clear to auscultation. Respiratory effort normal. Cardiovascular system: S1 & S2 heard, RRR. Gastrointestinal system: Abdomen is morbidly obese, soft and nontender.  Organs are difficult to assess.   Central nervous system: Alert and oriented. No focal neurological deficits. Extremities: No leg edema.   Psychiatry: Judgement and insight appear normal. Mood & affect appropriate.    Data Reviewed: I have personally reviewed following  labs and imaging studies  CBC: Recent Labs  Lab 04/09/18 1940 04/10/18 0957  WBC 7.2  --   HGB 6.2* 6.5*  HCT 23.7* 23.8*  MCV 80.9  --   PLT 378  --    Basic Metabolic Panel: Recent Labs  Lab 04/09/18 1940 04/10/18 0709  NA 144 143  K 3.9 3.7  CL 112* 109  CO2 25 26  GLUCOSE 94 99  BUN 10 10  CREATININE 0.95 0.96  CALCIUM 8.7* 8.3*   GFR: Estimated Creatinine Clearance: 111.4 mL/min (by C-G formula based on SCr of 0.96 mg/dL). Liver Function Tests: No results for input(s): AST, ALT, ALKPHOS, BILITOT, PROT, ALBUMIN in the last 168 hours. No results for input(s): LIPASE, AMYLASE in the last 168 hours. No results for input(s): AMMONIA in the last 168 hours. Coagulation Profile: No results for input(s): INR, PROTIME in the last 168 hours. Cardiac Enzymes: Recent Labs  Lab 04/09/18 2302 04/10/18 0709 04/10/18 0957  TROPONINI <0.03 <0.03 <0.03   BNP (last 3 results) No results for input(s): PROBNP in the last 8760 hours. HbA1C: Recent Labs    04/10/18 0709  HGBA1C 5.4   CBG: No results for input(s): GLUCAP in the last 168 hours. Lipid Profile: Recent Labs    04/10/18 0709  CHOL 116  HDL 39*  LDLCALC 64  TRIG 67  CHOLHDL 3.0   Thyroid Function Tests: No results for input(s): TSH, T4TOTAL, FREET4, T3FREE, THYROIDAB in the last 72 hours. Anemia Panel: Recent Labs    04/09/18 2149 04/09/18 2302  VITAMINB12 196  --   FOLATE 9.5  --   FERRITIN 6*  --   TIBC 346  --   IRON 14*  --   RETICCTPCT  --  1.6   Urine analysis:    Component Value Date/Time   COLORURINE RED BIOCHEMICALS MAY BE AFFECTED BY COLOR (A) 01/06/2011 1755   APPEARANCEUR CLOUDY (A) 01/06/2011 1755   LABSPEC >1.030 (H) 01/06/2011 1755   PHURINE 5.0 01/06/2011 1755   GLUCOSEU NEGATIVE 01/06/2011 1755   HGBUR LARGE (A) 01/06/2011 [place] 01/06/2011 1755   KETONESUR NEGATIVE 01/06/2011 1755   PROTEINUR 30 (A) 01/06/2011 1755   UROBILINOGEN 0.2 01/06/2011 1755    NITRITE NEGATIVE 01/06/2011 1755   LEUKOCYTESUR TRACE (A) 01/06/2011 1755   Sepsis Labs: @LABRCNTIP (procalcitonin:4,lacticidven:4)  )No results found for this or any previous visit (from the past 240 hour(s)).       Radiology Studies: Dg Chest 2 View  Result Date: 04/09/2018 CLINICAL DATA:  Headache and shortness of breath. EXAM: CHEST - 2 VIEW COMPARISON:  August 09, 2010 FINDINGS: Cardiomegaly. The hila and mediastinum normal. No pulmonary nodules, masses, or focal infiltrates. No overt edema. IMPRESSION: Cardiomegaly, mildly increased since 2011. No acute abnormalities otherwise seen. Electronically Signed   By: Dorise Bullion III M.D   On: 04/09/2018 19:17        Scheduled Meds: . ferrous sulfate  325 mg Oral BID WC  . mometasone-formoterol  2 puff Inhalation BID  .  montelukast  10 mg Oral QHS   Continuous Infusions: . sodium chloride       LOS: 0 days    Time spent: 25 minutes.   Dana Allan, MD  [facility] Pager #: [phone] 7PM-7AM contact night coverage as above

## 2018-04-10 NOTE — Progress Notes (Signed)
Paged MD Ogbata for critical hemoglobin of 6.5  Awaiting call back

## 2018-04-10 NOTE — Progress Notes (Signed)
MD returned page, stated he will place order for 1 unit of blood transfusion  Educated staff and pt of needing stool sample  Pt has not had BM this shift, MD aware Will continue to monitor

## 2018-04-11 LAB — TYPE AND SCREEN
ABO/RH(D): A POS
ANTIBODY SCREEN: POSITIVE
DONOR AG TYPE: NEGATIVE
DONOR AG TYPE: NEGATIVE
PT AG TYPE: NEGATIVE
Unit division: 0
Unit division: 0

## 2018-04-11 LAB — BPAM RBC
BLOOD PRODUCT EXPIRATION DATE: 201908142359
Blood Product Expiration Date: 201908152359
ISSUE DATE / TIME: 201907270150
ISSUE DATE / TIME: 201907271227
Unit Type and Rh: 6200
Unit Type and Rh: 6200

## 2018-04-11 LAB — CBC
HCT: 26 % — ABNORMAL LOW (ref 36.0–46.0)
Hemoglobin: 7.3 g/dL — ABNORMAL LOW (ref 12.0–15.0)
MCH: 23.4 pg — ABNORMAL LOW (ref 26.0–34.0)
MCHC: 28.1 g/dL — ABNORMAL LOW (ref 30.0–36.0)
MCV: 83.3 fL (ref 78.0–100.0)
Platelets: 324 10*3/uL (ref 150–400)
RBC: 3.12 MIL/uL — ABNORMAL LOW (ref 3.87–5.11)
RDW: 19.5 % — ABNORMAL HIGH (ref 11.5–15.5)
WBC: 9 10*3/uL (ref 4.0–10.5)

## 2018-04-11 NOTE — Discharge Summary (Signed)
Physician Discharge Summary  Patient ID: Margaret [name] MRN: [id number] DOB/AGE: [birth year] 49 y.o.  Admit date: 04/09/2018 Discharge date: 04/11/2018  Admission Diagnoses:  Discharge Diagnoses:  Principal Problem:   Symptomatic anemia  Active Problems:    Iron deficiency    Morbid obesity    Anxiety    Asthma    Hypertension    Migraine    Chest pain   Discharged Condition: stable  Hospital Course: Patient is a 47 year old African-American female, morbidly obese, with past medical history significant for hypertension, asthma, anxiety, migraine headaches, iron deficiency anemia, obesity, and eczema.  Patient was admitted with symptomatic anemia.  On presentation, patient endorsed shortness of breath, lightheadedness, dizziness, generalized weakness and chest pain.    On presentation, the patient's hemoglobin was 6.2g/dL.  Patient was transfused with a total of 2 units of packed red blood cells.  Hemoglobin prior to discharge was 7.3 g/dL.  Patient also received 510 mg of Feraheme IV.  Patient will follow-up with hematology on discharge as the patient will need repeat dose of IV Feraheme in the next 3 to 8 days.  The patient has refused iron pills.   That will be need to monitor patient's iron from time to time, and manage accordingly.  Iron studies done revealed severe iron deficiency.  Patient will need to follow-up with the GI team on discharge, as the patient will need colonoscopy to rule out any GI malignancy.  Consults: None  Significant Diagnostic Studies: labs: Iron deficiency and anemia noted  Treatments: 2 units of blood transfusion.  IV Feraheme 510 mg.  Discharge Exam: Blood pressure (!) 162/91, pulse 76, temperature 98 F (36.7 C), temperature source Oral, resp. rate 18, height 5\' 2"  (1.575 m), weight (!) 172.4 kg (380 lb 1.6 oz), SpO2 100 %.   Disposition: Discharge disposition: 01-Home or Self Care   Discharge Instructions    Call MD for:   Complete by:  As  directed    Please call MD if the symptoms worsen   Diet - low sodium heart healthy   Complete by:  As directed    Discharge instructions   Complete by:  As directed    Please monitor your blood pressure at least 3 times a day for the next 1 week, document readings and discussed with your primary care provider.   Increase activity slowly   Complete by:  As directed      Allergies as of 04/11/2018      Reactions   Latex Hives, Shortness Of Breath   Tylenol [acetaminophen] Hives   Codeine Hives      Medication List    STOP taking these medications   cyclobenzaprine 10 MG tablet Commonly known as:  FLEXERIL   diazepam 5 MG tablet Commonly known as:  VALIUM   ELIDEL 1 % cream Generic drug:  pimecrolimus   fluticasone 50 MCG/ACT nasal spray Commonly known as:  FLONASE   ibuprofen 800 MG tablet Commonly known as:  ADVIL,MOTRIN   PATADAY 0.2 % Soln Generic drug:  Olopatadine HCl     TAKE these medications   ergocalciferol 50000 units capsule Commonly known as:  VITAMIN D2 Take 50,000 Units by mouth once a week.   montelukast 10 MG tablet Commonly known as:  SINGULAIR TAKE ONE TABLET BY MOUTH at bedtime   PROAIR HFA 108 (90 Base) MCG/ACT inhaler Generic drug:  albuterol INHALE TWO puffs BY MOUTH into the lungs EVERY 4 HOURS AS NEEDED FOR WHEEZING OR shortness of breath  SYMBICORT 160-4.5 MCG/ACT inhaler Generic drug:  budesonide-formoterol INHALE TWO PUFFS BY MOUTH TWICE DAILY   tiZANidine 4 MG tablet Commonly known as:  ZANAFLEX Take 4 mg by mouth every 6 (six) hours as needed for muscle spasms.   triamcinolone 55 MCG/ACT Aero nasal inhaler Commonly known as:  NASACORT Place 2 sprays into the nose daily as needed (for allergies).   valACYclovir 500 MG tablet Commonly known as:  VALTREX Take 500 mg by mouth 2 (two) times daily as needed (for cold sores).   zolpidem 5 MG tablet Commonly known as:  AMBIEN Take 5 mg by mouth at bedtime as needed for  sleep.      25 minutes spent discharging this patient  Signed: Bonnell Public 04/11/2018, 11:42 AM

## 2018-04-14 ENCOUNTER — Encounter: Payer: Self-pay | Admitting: Hematology

## 2018-04-14 ENCOUNTER — Telehealth: Payer: Self-pay | Admitting: Hematology

## 2018-04-14 NOTE — Telephone Encounter (Signed)
New referral received from [facility] for a dx of anemia. Pt has been scheduled to see Dr. Irene Limbo on 8/14 at 11am. Pt aware to arrive 30 minutes early. Address verified. Letter mailed

## 2018-04-16 ENCOUNTER — Emergency Department (HOSPITAL_COMMUNITY): Payer: Medicaid Other

## 2018-04-16 ENCOUNTER — Emergency Department (HOSPITAL_COMMUNITY)
Admission: EM | Admit: 2018-04-16 | Discharge: 2018-04-16 | Disposition: A | Discharge status: Home / Self Care | Payer: Medicaid Other | Attending: Emergency Medicine | Admitting: Emergency Medicine

## 2018-04-16 ENCOUNTER — Other Ambulatory Visit: Payer: Self-pay

## 2018-04-16 DIAGNOSIS — R1084 Generalized abdominal pain: Secondary | ICD-10-CM | POA: Insufficient documentation

## 2018-04-16 DIAGNOSIS — Z79899 Other long term (current) drug therapy: Secondary | ICD-10-CM | POA: Diagnosis not present

## 2018-04-16 DIAGNOSIS — I10 Essential (primary) hypertension: Secondary | ICD-10-CM | POA: Diagnosis not present

## 2018-04-16 DIAGNOSIS — Z91040 Latex allergy status: Secondary | ICD-10-CM | POA: Insufficient documentation

## 2018-04-16 DIAGNOSIS — R1011 Right upper quadrant pain: Secondary | ICD-10-CM | POA: Diagnosis present

## 2018-04-16 DIAGNOSIS — Z87891 Personal history of nicotine dependence: Secondary | ICD-10-CM | POA: Diagnosis not present

## 2018-04-16 LAB — URINALYSIS, ROUTINE W REFLEX MICROSCOPIC
BILIRUBIN URINE: NEGATIVE
Glucose, UA: NEGATIVE mg/dL
Ketones, ur: NEGATIVE mg/dL
Nitrite: NEGATIVE
PH: 5 (ref 5.0–8.0)
Protein, ur: 30 mg/dL — AB
SPECIFIC GRAVITY, URINE: 1.025 (ref 1.005–1.030)
WBC, UA: >50 WBC/hpf — ABNORMAL HIGH (ref 0–5)

## 2018-04-16 LAB — COMPREHENSIVE METABOLIC PANEL
ALT: 11 U/L (ref 0–44)
AST: 13 U/L — AB (ref 15–41)
Albumin: 3.1 g/dL — ABNORMAL LOW (ref 3.5–5.0)
Alkaline Phosphatase: 65 U/L (ref 38–126)
Anion gap: 16 — ABNORMAL HIGH (ref 5–15)
BUN: 13 mg/dL (ref 6–20)
CHLORIDE: 107 mmol/L (ref 98–111)
CO2: 22 mmol/L (ref 22–32)
Calcium: 9.2 mg/dL (ref 8.9–10.3)
Creatinine, Ser: 1.07 mg/dL — ABNORMAL HIGH (ref 0.44–1.00)
GFR calc Af Amer: >60 mL/min (ref >60)
GFR calc non Af Amer: >60 mL/min (ref >60)
Glucose, Bld: 104 mg/dL — ABNORMAL HIGH (ref 70–99)
POTASSIUM: 3.9 mmol/L (ref 3.5–5.1)
Sodium: 145 mmol/L (ref 135–145)
Total Bilirubin: 0.7 mg/dL (ref 0.3–1.2)
Total Protein: 7.5 g/dL (ref 6.5–8.1)

## 2018-04-16 LAB — CBC
HEMATOCRIT: 33.6 % — AB (ref 36.0–46.0)
Hemoglobin: 9.5 g/dL — ABNORMAL LOW (ref 12.0–15.0)
MCH: 23.8 pg — ABNORMAL LOW (ref 26.0–34.0)
MCHC: 28.3 g/dL — ABNORMAL LOW (ref 30.0–36.0)
MCV: 84 fL (ref 78.0–100.0)
Platelets: 435 10*3/uL — ABNORMAL HIGH (ref 150–400)
RBC: 4 MIL/uL (ref 3.87–5.11)
RDW: 23.2 % — ABNORMAL HIGH (ref 11.5–15.5)
WBC: 9.6 10*3/uL (ref 4.0–10.5)

## 2018-04-16 LAB — LIPASE, BLOOD: LIPASE: 42 U/L (ref 11–51)

## 2018-04-16 MED ORDER — SODIUM CHLORIDE 0.9 % IV BOLUS
1000.0000 mL | Freq: Once | INTRAVENOUS | Status: AC
  Administered 2018-04-16: 1000 mL via INTRAVENOUS

## 2018-04-16 MED ORDER — KETOROLAC TROMETHAMINE 30 MG/ML IJ SOLN
15.0000 mg | Freq: Once | INTRAMUSCULAR | Status: AC
  Administered 2018-04-16: 15 mg via INTRAVENOUS
  Filled 2018-04-16: qty 1

## 2018-04-16 MED ORDER — SODIUM CHLORIDE 0.9 % IV SOLN
1.0000 g | Freq: Once | INTRAVENOUS | Status: AC
  Administered 2018-04-16: 1 g via INTRAVENOUS
  Filled 2018-04-16: qty 10

## 2018-04-16 MED ORDER — CEPHALEXIN 500 MG PO CAPS: 500.0000 mg | ORAL_CAPSULE | Freq: Two times a day (BID) | ORAL | 0 refills | Status: DC

## 2018-04-16 MED ORDER — DICYCLOMINE HCL 20 MG PO TABS: 20.0000 mg | ORAL_TABLET | Freq: Two times a day (BID) | ORAL | 0 refills | Status: AC

## 2018-04-16 MED ORDER — MORPHINE SULFATE (PF) 4 MG/ML IV SOLN
4.0000 mg | Freq: Once | INTRAVENOUS | Status: AC
  Administered 2018-04-16: 4 mg via INTRAVENOUS
  Filled 2018-04-16: qty 1

## 2018-04-16 MED ORDER — IOHEXOL 300 MG/ML  SOLN
100.0000 mL | Freq: Once | INTRAMUSCULAR | Status: AC | PRN
  Administered 2018-04-16: 100 mL via INTRAVENOUS

## 2018-04-16 MED ORDER — DICYCLOMINE HCL 10 MG PO CAPS
10.0000 mg | ORAL_CAPSULE | Freq: Once | ORAL | Status: AC
  Administered 2018-04-16: 10 mg via ORAL
  Filled 2018-04-16: qty 1

## 2018-04-16 MED ORDER — ONDANSETRON HCL 4 MG/2ML IJ SOLN
4.0000 mg | Freq: Once | INTRAMUSCULAR | Status: AC
  Administered 2018-04-16: 4 mg via INTRAVENOUS
  Filled 2018-04-16: qty 2

## 2018-04-16 NOTE — ED Provider Notes (Signed)
[facility] EMERGENCY DEPARTMENT Provider Note   CSN: [id number] Arrival date & time: 04/16/18  0020     History   Chief Complaint Chief Complaint  Patient presents with  . Abdominal Pain    HPI Margaret [name] is a 47 y.o. female.  HPI 47 year old female past medical history significant for anemia presents to the emergency department today for evaluation of right-sided abdominal pain.  Patient reports right-sided abdominal pain that radiates to the right flank onset 3 days ago.  Patient describes the pain is cramping and sharp in nature.  Does not radiate.  Nothing makes better or worse.  States the pain is intermittent.  Denies any urinary symptoms, vaginal bleeding, vaginal discharge.  Denies any change in her bowel habits.  Reports some nausea but denies any emesis.  No history of same.  Denies any abdominal surgeries.  Patient was discharged from hospital 5 days ago after having a blood transfusion requiring 2 units of blood for severe iron deficiency anemia.  She denies any bloody stools.  Denies any fevers or chills.  She has not taken anything for pain prior to arrival.  Nothing makes better.  No history of same pain.  Pt denies any fever, chill, ha, vision changes, lightheadedness, dizziness, congestion, neck pain, cp, sob, cough, v/d, urinary symptoms, change in bowel habits, melena, hematochezia, lower extremity paresthesias.  Past Medical History:  Diagnosis Date  . Allergic rhinitis   . Anxiety diagnosed in 1990  . Arthritis   . Asthma   . Eczema   . Hypertension   . Lower back pain   . Migraine     Patient Active Problem List   Diagnosis Date Noted  . Symptomatic anemia 04/09/2018  . Chest pain 04/09/2018  . Anxiety   . Asthma   . Hypertension   . Migraine     Past Surgical History:  Procedure Laterality Date  . ABDOMINAL HYSTERECTOMY       OB History   None      Home Medications    Prior to Admission medications     Medication Sig Start Date End Date Taking? Authorizing Provider  ergocalciferol (VITAMIN D2) 50000 UNITS capsule Take 50,000 Units by mouth once a week.   Yes [provider]  PROAIR HFA 108 (90 Base) MCG/ACT inhaler INHALE TWO puffs BY MOUTH into the lungs EVERY 4 HOURS AS NEEDED FOR WHEEZING OR shortness of breath 11/18/17  Yes Padgett, Rae Halsted, MD  SYMBICORT 160-4.5 MCG/ACT inhaler INHALE TWO PUFFS BY MOUTH TWICE DAILY Patient taking differently: INHALE TWO PUFFS BY MOUTH TWICE DAILY AS NEEDED FOR SOB 11/23/17  Yes Padgett, Rae Halsted, MD  tiZANidine (ZANAFLEX) 4 MG tablet Take 4 mg by mouth every 6 (six) hours as needed for muscle spasms.   Yes [provider]  triamcinolone (NASACORT) 55 MCG/ACT AERO nasal inhaler Place 2 sprays into the nose daily as needed (for allergies).    Yes [provider]  valACYclovir (VALTREX) 500 MG tablet Take 500 mg by mouth 2 (two) times daily as needed (for cold sores).    Yes [provider]  zolpidem (AMBIEN) 5 MG tablet Take 5 mg by mouth at bedtime as needed for sleep.   Yes [provider]  cephALEXin (KEFLEX) 500 MG capsule Take 1 capsule (500 mg total) by mouth 2 (two) times daily. 04/16/18   Doristine Devoid, PA-C  dicyclomine (BENTYL) 20 MG tablet Take 1 tablet (20 mg total) by mouth 2 (  two) times daily. 04/16/18   Doristine Devoid, PA-C  montelukast (SINGULAIR) 10 MG tablet TAKE ONE TABLET BY MOUTH at bedtime Patient not taking: Reported on 04/16/2018 09/22/17   Kennith Gain, MD    Family History No family history on file.  Social History Social History   Tobacco Use  . Smoking status: Former Smoker    Last attempt to quit: 07/30/1999    Years since quitting: 18.7  . Smokeless tobacco: Never Used  Substance Use Topics  . Alcohol use: No  . Drug use: No     Allergies   Latex; Tylenol [acetaminophen]; and Codeine   Review of Systems Review of Systems  All other  systems reviewed and are negative.    Physical Exam Updated Vital Signs BP 128/78   Pulse 64   Temp 97.9 F (36.6 C) (Oral)   Resp 17   Ht 5\' 10"  (1.778 m)   Wt (!) 175.5 kg (387 lb)   SpO2 99%   BMI 55.53 kg/m   Physical Exam  Constitutional: She is oriented to person, place, and time. She appears well-developed and well-nourished.  Non-toxic appearance. No distress.  HENT:  Head: Normocephalic and atraumatic.  Nose: Nose normal.  Mouth/Throat: Oropharynx is clear and moist.  Eyes: Pupils are equal, round, and reactive to light. Conjunctivae are normal. Right eye exhibits no discharge. Left eye exhibits no discharge.  Neck: Normal range of motion. Neck supple.  Cardiovascular: Normal rate, regular rhythm, normal heart sounds and intact distal pulses.  Pulmonary/Chest: Effort normal and breath sounds normal. No respiratory distress. She exhibits no tenderness.  Abdominal: Soft. Normal appearance and bowel sounds are normal. There is tenderness in the right upper quadrant, right lower quadrant, epigastric area and suprapubic area. There is no rigidity, no rebound, no guarding, no CVA tenderness, no tenderness at McBurney's point and negative Murphy's sign.  Musculoskeletal: Normal range of motion. She exhibits no tenderness.  Lymphadenopathy:    She has no cervical adenopathy.  Neurological: She is alert and oriented to person, place, and time.  Skin: Skin is warm and dry. Capillary refill takes less than 2 seconds.  Psychiatric: Her behavior is normal. Judgment and thought content normal.  Nursing note and vitals reviewed.    ED Treatments / Results  Labs (all labs ordered are listed, but only abnormal results are displayed) Labs Reviewed  COMPREHENSIVE METABOLIC PANEL - Abnormal; Notable for the following components:      Result Value   Glucose, Bld 104 (*)    Creatinine, Ser 1.07 (*)    Albumin 3.1 (*)    AST 13 (*)    Anion gap 16 (*)    All other components within  normal limits  CBC - Abnormal; Notable for the following components:   Hemoglobin 9.5 (*)    HCT 33.6 (*)    MCH 23.8 (*)    MCHC 28.3 (*)    RDW 23.2 (*)    Platelets 435 (*)    All other components within normal limits  URINALYSIS, ROUTINE W REFLEX MICROSCOPIC - Abnormal; Notable for the following components:   APPearance CLOUDY (*)    Hgb urine dipstick LARGE (*)    Protein, ur 30 (*)    Leukocytes, UA MODERATE (*)    RBC / HPF >50 (*)    WBC, UA >50 (*)    Bacteria, UA RARE (*)    All other components within normal limits  URINE CULTURE  LIPASE, BLOOD  EKG None  Radiology US Abdomen Complete  Result Date: 04/16/2018 CLINICAL DATA:  Epigastric pain. EXAM: ABDOMEN ULTRASOUND COMPLETE COMPARISON:  CT abdomen and pelvis April 16, 2018. FINDINGS: Habitus limited examination. Gallbladder: Echogenic gallstones measuring to 7 mm with acoustic shadowing. No gallbladder wall thickening. No pericholecystic fluid. No sonographic Murphy sign elicited. Common bile duct: Diameter: 6 mm Liver: Limited assessment due to difficulty penetrating parenchyma. Known lesions are not appreciated. Portal vein is patent on color Doppler imaging with normal direction of blood flow towards the liver. IVC: No abnormality visualized. Pancreas: Visualized portion unremarkable. Spleen: Size and appearance within normal limits. Right Kidney: Length: 10.3 cm. Echogenicity within normal limits. No mass or hydronephrosis visualized. Left Kidney: Length: 9.1 cm. Echogenicity within normal limits. No mass or hydronephrosis visualized. Abdominal aorta: No aneurysm visualized. Other findings: None. IMPRESSION: 1. Habitus limited examination. 2. Cholelithiasis without sonographic findings of acute cholecystitis. Electronically Signed   By: Elon Alas M.D.   On: 04/16/2018 06:00   Ct Abdomen Pelvis W Contrast  Result Date: 04/16/2018 CLINICAL DATA:  Acute abdominal pain. Right-sided abdominal pain for 2 days. EXAM:  CT ABDOMEN AND PELVIS WITH CONTRAST TECHNIQUE: Multidetector CT imaging of the abdomen and pelvis was performed using the standard protocol following bolus administration of intravenous contrast. CONTRAST:  149mL OMNIPAQUE IOHEXOL 300 MG/ML  SOLN COMPARISON:  None. FINDINGS: Lower chest: Mild right lower lobe atelectasis adjacent to elevated right hemidiaphragm. Hepatobiliary: The liver is enlarged spanning 21 cm cranial caudal with diffusely decreased density suggesting steatosis. There multiple low-density lesions throughout the liver, incompletely characterized. Subdiaphragmatic 9 mm hypodensity image 91 series 3, 11 mm subcapsular lesion image 13, 19 mm left lobe and adjacent 9 mm left lobe lesion image 24. Gallstone within partially distended gallbladder. No pericholecystic inflammation. Pancreas: No ductal dilatation or inflammation. There is an 11 mm nodule arising from or adjacent to the pancreatic tail image 23 series 3. Spleen: Normal in size without focal abnormality. Adrenals/Urinary Tract: Normal adrenal glands. No hydronephrosis or perinephric edema. Homogeneous renal enhancement with symmetric excretion on delayed phase imaging. Small cysts in the upper and lower right kidney. Urinary bladder is physiologically distended without wall thickening. Stomach/Bowel: Stomach is nondistended. No small bowel inflammation, wall thickening or obstruction. Multifocal colonic diverticulosis throughout the entire colon. No diverticulitis. The appendix is tentatively identified, image 74. Regardless, no pericecal or right lower quadrant inflammation to suggest appendicitis. Vascular/Lymphatic: No acute vascular findings. No enlarged abdominal or pelvic lymph nodes. Reproductive: Status post hysterectomy. No adnexal masses. Ovaries tentatively identified and quiescent. Other: Small fat containing umbilical hernia. No free air, free fluid, or intra-abdominal fluid collection. Musculoskeletal: Degenerative change  throughout the lumbar spine. Bone island in the right iliac bone. There are no acute or suspicious osseous abnormalities. IMPRESSION: 1. No acute findings in the abdomen/pelvis. 2. Least 4 low-density hepatic lesions, some of which may represent small cysts or hemangiomas, however there is a 19 mm lesion in the left lobe of the liver that is incompletely characterized. Recommend nonemergent hepatic protocol MRI for further evaluation. 3. Possible 11 mm pancreatic nodule rising from the tail, alternatively this may represent a splenule. This can also be assessed at time of hepatic MRI. 4. Gallstone without gallbladder inflammation. 5. Colonic diverticulosis without diverticulitis. Electronically Signed   By: Jeb Levering M.D.   On: 04/16/2018 04:03    Procedures Procedures (including critical care time)  Medications Ordered in ED Medications  morphine 4 MG/ML injection 4 mg (4 mg Intravenous Given  04/16/18 0239)  ondansetron (ZOFRAN) injection 4 mg (4 mg Intravenous Given 04/16/18 0240)  iohexol (OMNIPAQUE) 300 MG/ML solution 100 mL (100 mLs Intravenous Contrast Given 04/16/18 0334)  sodium chloride 0.9 % bolus 1,000 mL (0 mLs Intravenous Stopped 04/16/18 0628)  cefTRIAXone (ROCEPHIN) 1 g in sodium chloride 0.9 % 100 mL IVPB (0 g Intravenous Stopped 04/16/18 0628)  morphine 4 MG/ML injection 4 mg (4 mg Intravenous Given 04/16/18 0452)  ketorolac (TORADOL) 30 MG/ML injection 15 mg (15 mg Intravenous Given 04/16/18 0451)  dicyclomine (BENTYL) capsule 10 mg (10 mg Oral Given 04/16/18 7824)     Initial Impression / Assessment and Plan / ED Course  I have reviewed the triage vital signs and the nursing notes.  Pertinent labs & imaging results that were available during my care of the patient were reviewed by me and considered in my medical decision making (see chart for details).     Patient presents to the ED for evaluation of right-sided abdominal pain that radiates to the right flank.  Denies any other  associated symptoms.  Other signs reassuring.  On exam patient does have pain to the right upper and right lower quadrants.  No CVA tenderness.  No signs of peritonitis.  Bowel sounds present in all 4 quadrants.  Heart regular rate and rhythm.  Lungs clear to auscultation bilaterally.  Does appear somewhat uncomfortable secondary to pain.  Lab work shows no leukocytosis.  Patient's hemoglobin is 9.5 and this is increased from prior.  Patient did have blood transfusion 5 days ago.  Denies any bloody stools.  UA shows large amounts of hemoglobin, moderate leukocytes, rare bacteria and is nitrite negative.  Urine culture is pending.  CT abdomen pelvis reveals no acute findings.  Does note liver and pancreatic cyst that recommend outpatient MRI follow-up.  This was discussed with patient.  Gallstones were noted without any signs of cholecystitis.  Ultrasound was obtained given patient's ongoing pain that was reassuring.  No signs of cholecystitis.  Unknown etiology of patient's pain.  May be secondary to urinary tract infection.  Patient treated with Rocephin in the ED and will do discharged home with Keflex.  Pain managed in the ED with morphine and Toradol.  Tolerating p.o. fluids without any emesis.  Patient does have a mild anion gap of 16.  However her bicarb is normal and her glucose is normal without any ketones in her urine.  Doubt DKA.  Likely secondary to dehydration.  Patient given fluids in the ED.  Patient does not meet Sirs or sepsis criteria.  Repeat abdominal exam shows no signs of peritonitis.  Pt is hemodynamically stable, in NAD, & able to ambulate in the ED. Evaluation does not show pathology that would require ongoing emergent intervention or inpatient treatment. I explained the diagnosis to the patient. Pain has been managed & has no complaints prior to dc. Pt is comfortable with above plan and is stable for discharge at this time. All questions were answered prior to disposition. Strict return  precautions for f/u to the ED were discussed. Encouraged follow up with PCP.]  Discussed with my attending who felt agreeable the above plan.  Final Clinical Impressions(s) / ED Diagnoses   Final diagnoses:  Generalized abdominal pain    ED Discharge Orders        Ordered    cephALEXin (KEFLEX) 500 MG capsule  2 times daily     04/16/18 0628    dicyclomine (BENTYL) 20 MG tablet  2 times  daily     04/16/18 0628       Doristine Devoid, PA-C 04/16/18 [id number]    Veryl Speak, MD 04/16/18 2259

## 2018-04-16 NOTE — ED Notes (Signed)
Patient transported to Ultrasound 

## 2018-04-16 NOTE — ED Triage Notes (Signed)
Patient c/o right side abd pain since Tuesday; was d/c'd from here on Sunday.

## 2018-04-16 NOTE — ED Notes (Signed)
Patient transported to CT 

## 2018-04-16 NOTE — Discharge Instructions (Addendum)
Your work-up has been reassuring in the ED.  Unknown cause your symptoms.  Have discussed your CT findings with you concerning the liver and pancreas cyst.  This needs close follow-up.  You do have possible signs of a urinary tract infection.  We will give you antibiotics to treat.  May take Bentyl for any stomach cramping.  Return the ED if you develop worsening pain, fevers or for any other reason.  Follow-up with your primary care doctor next week.

## 2018-04-17 LAB — URINE CULTURE

## 2018-04-22 ENCOUNTER — Other Ambulatory Visit: Payer: Self-pay | Admitting: Internal Medicine

## 2018-04-22 DIAGNOSIS — Z1231 Encounter for screening mammogram for malignant neoplasm of breast: Secondary | ICD-10-CM

## 2018-04-27 NOTE — Progress Notes (Signed)
HEMATOLOGY/ONCOLOGY CONSULTATION NOTE  Date of Service: 04/28/2018  Patient Care Team: Nolene Ebbs, MD as PCP - General (Internal Medicine)  CHIEF COMPLAINTS/PURPOSE OF CONSULTATION:  Iron Deficiency Anemia  HISTORY OF PRESENTING ILLNESS:   Margaret [name] is a wonderful 47 y.o. female who has been referred to [place] by Dr. Nolene Ebbs for evaluation and management of Iron deficiency anemia. The pt reports that she is doing well overall.   The pt recently presented to the ED on 04/16/18 for right sided abdominal pain that was evaluated with a CT A/P which revealed liver and pancreatic cyst, recommended for outpatient MRI follow up. The pt was found to have a UTI, was treated with Rocephin and discharged with Keflex. Prior to this the pt was admitted on 04/09/18 for symptomatic anemia, presenting with HGB at 6.2, received 2 units PRBCs, one IV Iron infusion, and was encouraged to seek out GI as outpatient.  She has an appointment with [facility] on 05/19/18.   The pt reports that she is still feeling dizzy and light headed and denies feeling better after her recent blood transfusion. She notes that she dizziness presents with a feeling of warmness and that she begins hyperventilating, feeling nervous about her dizziness.  She notes that prior to her recent hospital stay, she never required a blood transfusion or IV iron replacement. She notes that as a child she had frequent nose bleeds, and was diagnosed with anemia. She also notes a history of ice picca.   She had a hysterectomy in 2012, and prior to this she had very heavy periods that began at age 39 and occurred almost constantly. She was on Depo and Mirena which did not help slow menstrual losses. Besides taking iron supplements while pregnant she has not taken PO iron replacement as she reports not tolerating it very well while pregnant.   She denies concern for black stools or blood in the stools and has not had a colonoscopy or  endoscopy before. The pt notes that she has not previously had a concern for stomach ulcers, but has acid reflux and takes Prevacid as needed for 7-8 years. She notes that she has not recently used Prevacid, for the last 3 months.  She is not taking vitamin replacements and denies any dietary restrictions.  The pt notes that she has been continuing to have lower right quadrant abdominal pain that radiates across her abdomen, presents for up to 45 minutes and occurs intermittently. She notes that her abdominal pain presented on 04/13/18.   The pt notes that was taking Ibuprofen 800mg  BID for 8 years to address her back pain related to her herniated disk. She stopped taking this 4 months ago.   Of note prior to the patient's visit today, pt has had CT A/P completed on 04/16/18 with results revealing No acute findings in the abdomen/pelvis. Least 4 low-density hepatic lesions, some of which may represent small cysts or hemangiomas, however there is a 19 mm lesion in the left lobe of the liver that is incompletely characterized. Recommend nonemergent hepatic protocol MRI for further evaluation.Marland Kitchen Possible 11 mm pancreatic nodule rising from the tail, alternatively this may represent a splenule. This can also be assessed at time of hepatic MRI. Gallstone without gallbladder inflammation. Colonic diverticulosis without diverticulitis.   Most recent lab results (04/16/18) of CBC is as follows: all values are WNL except for HGB at 9.5, HCT at 33.6, MCH at 23.8, MCHC at 28.3, RDW at 23.2, PLT at  435k. Ferritin 04/09/18 was low at 6 Vitamin B12 on 04/09/18 was at 196  On review of systems, pt reports light headedness, dizziness, lower back pain, intermittent abdominal pain, and denies black stools, blood in the stools, tingling or numbness in her legs or arms, vaginal bleeding, and any other symptoms.   MEDICAL HISTORY:  Past Medical History:  Diagnosis Date  . Allergic rhinitis   . Anxiety diagnosed in 1990  .  Arthritis   . Asthma   . Eczema   . Hypertension   . Lower back pain   . Migraine     SURGICAL HISTORY: Past Surgical History:  Procedure Laterality Date  . ABDOMINAL HYSTERECTOMY      SOCIAL HISTORY: Social History   Socioeconomic History  . Marital status: Single    Spouse name: Not on file  . Number of children: Not on file  . Years of education: Not on file  . Highest education level: Not on file  Occupational History  . Not on file  Social Needs  . Financial resource strain: Not on file  . Food insecurity:    Worry: Not on file    Inability: Not on file  . Transportation needs:    Medical: Not on file    Non-medical: Not on file  Tobacco Use  . Smoking status: Former Smoker    Last attempt to quit: 07/30/1999    Years since quitting: 18.7  . Smokeless tobacco: Never Used  Substance and Sexual Activity  . Alcohol use: No  . Drug use: No  . Sexual activity: Not on file  Lifestyle  . Physical activity:    Days per week: Not on file    Minutes per session: Not on file  . Stress: Not on file  Relationships  . Social connections:    Talks on phone: Not on file    Gets together: Not on file    Attends religious service: Not on file    Active member of club or organization: Not on file    Attends meetings of clubs or organizations: Not on file    Relationship status: Not on file  . Intimate partner violence:    Fear of current or ex partner: Not on file    Emotionally abused: Not on file    Physically abused: Not on file    Forced sexual activity: Not on file  Other Topics Concern  . Not on file  Social History Narrative  . Not on file    FAMILY HISTORY: History reviewed. No pertinent family history.  ALLERGIES:  is allergic to latex; tylenol [acetaminophen]; and codeine.  MEDICATIONS:  Current Outpatient Medications  Medication Sig Dispense Refill  . cephALEXin (KEFLEX) 500 MG capsule Take 1 capsule (500 mg total) by mouth 2 (two) times daily.  14 capsule 0  . dicyclomine (BENTYL) 20 MG tablet Take 1 tablet (20 mg total) by mouth 2 (two) times daily. 20 tablet 0  . ergocalciferol (VITAMIN D2) 50000 UNITS capsule Take 50,000 Units by mouth once a week.    . montelukast (SINGULAIR) 10 MG tablet TAKE ONE TABLET BY MOUTH at bedtime 30 tablet 5  . PROAIR HFA 108 (90 Base) MCG/ACT inhaler INHALE TWO puffs BY MOUTH into the lungs EVERY 4 HOURS AS NEEDED FOR WHEEZING OR shortness of breath 8.5 g 0  . SYMBICORT 160-4.5 MCG/ACT inhaler INHALE TWO PUFFS BY MOUTH TWICE DAILY (Patient taking differently: INHALE TWO PUFFS BY MOUTH TWICE DAILY AS NEEDED FOR SOB) 10.2  g 0  . tiZANidine (ZANAFLEX) 4 MG tablet Take 4 mg by mouth every 6 (six) hours as needed for muscle spasms.    Marland Kitchen triamcinolone (NASACORT) 55 MCG/ACT AERO nasal inhaler Place 2 sprays into the nose daily as needed (for allergies).     . valACYclovir (VALTREX) 500 MG tablet Take 500 mg by mouth 2 (two) times daily as needed (for cold sores).     . zolpidem (AMBIEN) 5 MG tablet Take 5 mg by mouth at bedtime as needed for sleep.     No current facility-administered medications for this visit.     REVIEW OF SYSTEMS:    10 Point review of Systems was done is negative except as noted above.  PHYSICAL EXAMINATION:  . Vitals:   04/28/18 1055  BP: (!) 162/91  Pulse: 72  Resp: 19  Temp: 99.1 F (37.3 C)  SpO2: 100%   Filed Weights   04/28/18 1055  Weight: (!) 370 lb 9.6 oz (168.1 kg)   .Body mass index is 53.18 kg/m.  GENERAL:alert, in no acute distress and comfortable SKIN: no acute rashes, no significant lesions EYES: conjunctiva are pink and non-injected, sclera anicteric OROPHARYNX: MMM, no exudates, no oropharyngeal erythema or ulceration NECK: supple, no JVD LYMPH:  no palpable lymphadenopathy in the cervical, axillary or inguinal regions LUNGS: clear to auscultation b/l with normal respiratory effort HEART: regular rate & rhythm ABDOMEN:  normoactive bowel sounds ,  non tender, not distended. Extremity: no pedal edema PSYCH: alert & oriented x 3 with fluent speech NEURO: no focal motor/sensory deficits  LABORATORY DATA:  I have reviewed the data as listed  . CBC Latest Ref Rng & Units 04/28/2018 04/16/2018 04/11/2018  WBC 3.9 - 10.3 K/uL 5.9 9.6 9.0  Hemoglobin 11.6 - 15.9 g/dL 9.7(L) 9.5(L) 7.3(L)  Hematocrit 34.8 - 46.6 % 32.0(L) 33.6(L) 26.0(L)  Platelets 145 - 400 K/uL 334 435(H) 324    . CMP Latest Ref Rng & Units 04/28/2018 04/16/2018 04/10/2018  Glucose 70 - 99 mg/dL 86 104(H) 99  BUN 6 - 20 mg/dL 10 13 10   Creatinine 0.44 - 1.00 mg/dL 0.85 1.07(H) 0.96  Sodium 135 - 145 mmol/L 145 145 143  Potassium 3.5 - 5.1 mmol/L 4.0 3.9 3.7  Chloride 98 - 111 mmol/L 110 107 109  CO2 22 - 32 mmol/L 24 22 26   Calcium 8.9 - 10.3 mg/dL 9.0 9.2 8.3(L)  Total Protein 6.5 - 8.1 g/dL 7.3 7.5 -  Total Bilirubin 0.3 - 1.2 mg/dL 0.3 0.7 -  Alkaline Phos 38 - 126 U/L 78 65 -  AST 15 - 41 U/L 11(L) 13(L) -  ALT 0 - 44 U/L 11 11 -   . Lab Results  Component Value Date   IRON 14 (L) 04/09/2018   TIBC 346 04/09/2018   IRONPCTSAT 4 (L) 04/09/2018   (Iron and TIBC)  Lab Results  Component Value Date   FERRITIN 6 (L) 04/09/2018   B12  - 196   RADIOGRAPHIC STUDIES: I have personally reviewed the radiological images as listed and agreed with the findings in the report. Dg Chest 2 View  Result Date: 04/09/2018 CLINICAL DATA:  Headache and shortness of breath. EXAM: CHEST - 2 VIEW COMPARISON:  August 09, 2010 FINDINGS: Cardiomegaly. The hila and mediastinum normal. No pulmonary nodules, masses, or focal infiltrates. No overt edema. IMPRESSION: Cardiomegaly, mildly increased since 2011. No acute abnormalities otherwise seen. Electronically Signed   By: Dorise Bullion III M.D   On: 04/09/2018 19:17  US Abdomen Complete  Result Date: 04/16/2018 CLINICAL DATA:  Epigastric pain. EXAM: ABDOMEN ULTRASOUND COMPLETE COMPARISON:  CT abdomen and pelvis April 16, 2018. FINDINGS: Habitus limited examination. Gallbladder: Echogenic gallstones measuring to 7 mm with acoustic shadowing. No gallbladder wall thickening. No pericholecystic fluid. No sonographic Murphy sign elicited. Common bile duct: Diameter: 6 mm Liver: Limited assessment due to difficulty penetrating parenchyma. Known lesions are not appreciated. Portal vein is patent on color Doppler imaging with normal direction of blood flow towards the liver. IVC: No abnormality visualized. Pancreas: Visualized portion unremarkable. Spleen: Size and appearance within normal limits. Right Kidney: Length: 10.3 cm. Echogenicity within normal limits. No mass or hydronephrosis visualized. Left Kidney: Length: 9.1 cm. Echogenicity within normal limits. No mass or hydronephrosis visualized. Abdominal aorta: No aneurysm visualized. Other findings: None. IMPRESSION: 1. Habitus limited examination. 2. Cholelithiasis without sonographic findings of acute cholecystitis. Electronically Signed   By: Elon Alas M.D.   On: 04/16/2018 06:00   Ct Abdomen Pelvis W Contrast  Result Date: 04/16/2018 CLINICAL DATA:  Acute abdominal pain. Right-sided abdominal pain for 2 days. EXAM: CT ABDOMEN AND PELVIS WITH CONTRAST TECHNIQUE: Multidetector CT imaging of the abdomen and pelvis was performed using the standard protocol following bolus administration of intravenous contrast. CONTRAST:  170mL OMNIPAQUE IOHEXOL 300 MG/ML  SOLN COMPARISON:  None. FINDINGS: Lower chest: Mild right lower lobe atelectasis adjacent to elevated right hemidiaphragm. Hepatobiliary: The liver is enlarged spanning 21 cm cranial caudal with diffusely decreased density suggesting steatosis. There multiple low-density lesions throughout the liver, incompletely characterized. Subdiaphragmatic 9 mm hypodensity image 91 series 3, 11 mm subcapsular lesion image 13, 19 mm left lobe and adjacent 9 mm left lobe lesion image 24. Gallstone within partially distended  gallbladder. No pericholecystic inflammation. Pancreas: No ductal dilatation or inflammation. There is an 11 mm nodule arising from or adjacent to the pancreatic tail image 23 series 3. Spleen: Normal in size without focal abnormality. Adrenals/Urinary Tract: Normal adrenal glands. No hydronephrosis or perinephric edema. Homogeneous renal enhancement with symmetric excretion on delayed phase imaging. Small cysts in the upper and lower right kidney. Urinary bladder is physiologically distended without wall thickening. Stomach/Bowel: Stomach is nondistended. No small bowel inflammation, wall thickening or obstruction. Multifocal colonic diverticulosis throughout the entire colon. No diverticulitis. The appendix is tentatively identified, image 74. Regardless, no pericecal or right lower quadrant inflammation to suggest appendicitis. Vascular/Lymphatic: No acute vascular findings. No enlarged abdominal or pelvic lymph nodes. Reproductive: Status post hysterectomy. No adnexal masses. Ovaries tentatively identified and quiescent. Other: Small fat containing umbilical hernia. No free air, free fluid, or intra-abdominal fluid collection. Musculoskeletal: Degenerative change throughout the lumbar spine. Bone island in the right iliac bone. There are no acute or suspicious osseous abnormalities. IMPRESSION: 1. No acute findings in the abdomen/pelvis. 2. Least 4 low-density hepatic lesions, some of which may represent small cysts or hemangiomas, however there is a 19 mm lesion in the left lobe of the liver that is incompletely characterized. Recommend nonemergent hepatic protocol MRI for further evaluation. 3. Possible 11 mm pancreatic nodule rising from the tail, alternatively this may represent a splenule. This can also be assessed at time of hepatic MRI. 4. Gallstone without gallbladder inflammation. 5. Colonic diverticulosis without diverticulitis. Electronically Signed   By: Jeb Levering M.D.   On: 04/16/2018 04:03      ASSESSMENT & PLAN:   47 y.o. female with  1. Iron Deficiency Anemia - ? Previous heavy periods vs GI losses . Recent heavy  NSAID use ? Ulcer 2. B12 deficiency PLAN -Discussed patient's most recent labs from 04/16/18, HGB at 9.5. The 04/09/18 Ferritin was low at 6 and Vitamin B12 was at 196 -Discussed the 04/16/18 CT A/P with pt which revealed No acute findings in the abdomen/pelvis.  Least 4 low-density hepatic lesions, some of which may represent small cysts or hemangiomas, however there is a 19 mm lesion in the left lobe of the liver that is incompletely characterized. Recommend nonemergent hepatic protocol MRI for further evaluation.Marland Kitchen Possible 11 mm pancreatic nodule rising from the tail, alternatively this may represent a splenule. This can also be assessed at time of hepatic MRI. Gallstone without gallbladder inflammation. Colonic diverticulosis without diverticulitis.  -Pt will be seeing [facility] on 05/19/18 -Deferring recommendation of MRI of liver to [facility] -Pt took 800mg  Ibuprofen BID for 8 years, and I discussed with her that this is an ulcerogenic medication -Will collect labs today and will look for absorption issues to r/o pernicious anemia -Will set pt up for her second IV Feraheme infusion -Ordering 1055mcg Vitamin B12 sublingually daily -Will see pt back in 6 weeks   Labs today IV Feraheme x 1 RTC with Labs with Dr Irene Limbo in 6 weeks   All of the patients questions were answered with apparent satisfaction. The patient knows to call the clinic with any problems, questions or concerns.  The total time spent in the appt was 45 minutes and more than 50% was on counseling and direct patient cares.    Sullivan Lone MD MS AAHIVMS [facility] Hematology/Oncology Physician [facility]  (Office):       [phone] (Work cell):  [phone] (Fax):           [phone]  04/28/2018 11:41 AM  I, Baldwin [place], am acting as a scribe for Dr. Irene Limbo  .I have reviewed the  above documentation for accuracy and completeness, and I agree with the above. Brunetta Genera MD

## 2018-04-28 ENCOUNTER — Inpatient Hospital Stay: Payer: Medicaid Other

## 2018-04-28 ENCOUNTER — Encounter: Payer: Self-pay | Admitting: Hematology

## 2018-04-28 ENCOUNTER — Inpatient Hospital Stay: Payer: Medicaid Other | Attending: Hematology | Admitting: Hematology

## 2018-04-28 ENCOUNTER — Telehealth: Payer: Self-pay

## 2018-04-28 VITALS — BP 162/91 | HR 72 | Temp 99.1°F | Resp 19 | Ht 70.0 in | Wt 370.6 lb

## 2018-04-28 DIAGNOSIS — E538 Deficiency of other specified B group vitamins: Secondary | ICD-10-CM | POA: Diagnosis not present

## 2018-04-28 DIAGNOSIS — N921 Excessive and frequent menstruation with irregular cycle: Secondary | ICD-10-CM | POA: Insufficient documentation

## 2018-04-28 DIAGNOSIS — F419 Anxiety disorder, unspecified: Secondary | ICD-10-CM | POA: Diagnosis not present

## 2018-04-28 DIAGNOSIS — Z791 Long term (current) use of non-steroidal anti-inflammatories (NSAID): Secondary | ICD-10-CM | POA: Diagnosis not present

## 2018-04-28 DIAGNOSIS — D649 Anemia, unspecified: Secondary | ICD-10-CM

## 2018-04-28 DIAGNOSIS — Z87891 Personal history of nicotine dependence: Secondary | ICD-10-CM

## 2018-04-28 DIAGNOSIS — R6889 Other general symptoms and signs: Secondary | ICD-10-CM

## 2018-04-28 DIAGNOSIS — I10 Essential (primary) hypertension: Secondary | ICD-10-CM | POA: Insufficient documentation

## 2018-04-28 DIAGNOSIS — D509 Iron deficiency anemia, unspecified: Secondary | ICD-10-CM | POA: Diagnosis present

## 2018-04-28 LAB — CBC WITH DIFFERENTIAL/PLATELET
BASOS ABS: 0.1 10*3/uL (ref 0.0–0.1)
BASOS PCT: 1 %
Eosinophils Absolute: 0.1 10*3/uL (ref 0.0–0.5)
Eosinophils Relative: 2 %
HEMATOCRIT: 32 % — AB (ref 34.8–46.6)
Hemoglobin: 9.7 g/dL — ABNORMAL LOW (ref 11.6–15.9)
Lymphocytes Relative: 27 %
Lymphs Abs: 1.6 10*3/uL (ref 0.9–3.3)
MCH: 24.6 pg — ABNORMAL LOW (ref 25.1–34.0)
MCHC: 30.4 g/dL — ABNORMAL LOW (ref 31.5–36.0)
MCV: 81 fL (ref 79.5–101.0)
MONO ABS: 0.3 10*3/uL (ref 0.1–0.9)
Monocytes Relative: 6 %
NEUTROS ABS: 3.8 10*3/uL (ref 1.5–6.5)
Neutrophils Relative %: 64 %
PLATELETS: 334 10*3/uL (ref 145–400)
RBC: 3.94 MIL/uL (ref 3.70–5.45)
RDW: 26.2 % — AB (ref 11.2–14.5)
WBC: 5.9 10*3/uL (ref 3.9–10.3)

## 2018-04-28 LAB — CMP (CANCER CENTER ONLY)
ALBUMIN: 3 g/dL — AB (ref 3.5–5.0)
ALK PHOS: 78 U/L (ref 38–126)
ALT: 11 U/L (ref 0–44)
ANION GAP: 11 (ref 5–15)
AST: 11 U/L — ABNORMAL LOW (ref 15–41)
BILIRUBIN TOTAL: 0.3 mg/dL (ref 0.3–1.2)
BUN: 10 mg/dL (ref 6–20)
CALCIUM: 9 mg/dL (ref 8.9–10.3)
CO2: 24 mmol/L (ref 22–32)
Chloride: 110 mmol/L (ref 98–111)
Creatinine: 0.85 mg/dL (ref 0.44–1.00)
GFR, Est AFR Am: >60 mL/min (ref >60)
GLUCOSE: 86 mg/dL (ref 70–99)
Potassium: 4 mmol/L (ref 3.5–5.1)
Sodium: 145 mmol/L (ref 135–145)
TOTAL PROTEIN: 7.3 g/dL (ref 6.5–8.1)

## 2018-04-28 LAB — SAMPLE TO BLOOD BANK

## 2018-04-28 LAB — TSH: TSH: 1.298 u[IU]/mL (ref 0.308–3.960)

## 2018-04-28 MED ORDER — B-12 1000 MCG SL SUBL: 1000.0000 ug | SUBLINGUAL_TABLET | Freq: Every day | SUBLINGUAL | 3 refills | Status: AC

## 2018-04-28 NOTE — Telephone Encounter (Signed)
Printed avs  And calender of upcoming appointment. Per 8/14 los. Lab add on/checked in

## 2018-04-29 LAB — ANTI-PARIETAL ANTIBODY: PARIETAL CELL ANTIBODY-IGG: 1.5 U (ref 0.0–20.0)

## 2018-04-30 LAB — INTRINSIC FACTOR ANTIBODIES: INTRINSIC FACTOR: 1 [AU]/ml (ref 0.0–1.1)

## 2018-05-07 ENCOUNTER — Ambulatory Visit (HOSPITAL_COMMUNITY)
Admission: RE | Admit: 2018-05-07 | Discharge: 2018-05-07 | Disposition: A | Discharge status: Home / Self Care | Payer: Medicaid Other | Source: Ambulatory Visit | Attending: Hematology | Admitting: Hematology

## 2018-05-07 DIAGNOSIS — D509 Iron deficiency anemia, unspecified: Secondary | ICD-10-CM | POA: Diagnosis present

## 2018-05-07 MED ORDER — SODIUM CHLORIDE 0.9 % IV SOLN
Freq: Once | INTRAVENOUS | Status: AC
  Administered 2018-05-07: 11:00:00 via INTRAVENOUS

## 2018-05-07 MED ORDER — SODIUM CHLORIDE 0.9 % IV SOLN
510.0000 mg | Freq: Once | INTRAVENOUS | Status: AC
  Administered 2018-05-07: 510 mg via INTRAVENOUS
  Filled 2018-05-07: qty 17

## 2018-05-07 NOTE — Discharge Instructions (Signed)

## 2018-05-07 NOTE — Progress Notes (Signed)
PATIENT CARE CENTER NOTE  Diagnosis: Iron Deficiency Anemia    Provider: Dr. Irene Limbo   Procedure: IV Feraheme    Note: Patient received infusion of Feraheme. Tolerated infusion well. Monitored patient for 30 minutes post-infusion. Vital signs stable. Discharge instructions given to patient. Patient alert, oriented and ambulatory at discharge.

## 2018-05-28 ENCOUNTER — Other Ambulatory Visit: Payer: Self-pay | Admitting: Neurosurgery

## 2018-05-28 DIAGNOSIS — M4316 Spondylolisthesis, lumbar region: Secondary | ICD-10-CM

## 2018-06-08 NOTE — Progress Notes (Signed)
HEMATOLOGY/ONCOLOGY CONSULTATION NOTE  Date of Service: 06/09/2018  Patient Care Team: Nolene Ebbs, MD as PCP - General (Internal Medicine)  CHIEF COMPLAINTS/PURPOSE OF CONSULTATION:  Iron Deficiency Anemia  HISTORY OF PRESENTING ILLNESS:   Margaret [name] is a wonderful 47 y.o. female who has been referred to [place] by Dr. Nolene Ebbs for evaluation and management of Iron deficiency anemia. The pt reports that she is doing well overall.   The pt recently presented to the ED on 04/16/18 for right sided abdominal pain that was evaluated with a CT A/P which revealed liver and pancreatic cyst, recommended for outpatient MRI follow up. The pt was found to have a UTI, was treated with Rocephin and discharged with Keflex. Prior to this the pt was admitted on 04/09/18 for symptomatic anemia, presenting with HGB at 6.2, received 2 units PRBCs, one IV Iron infusion, and was encouraged to seek out GI as outpatient.  She has an appointment with [facility] on 05/19/18.   The pt reports that she is still feeling dizzy and light headed and denies feeling better after her recent blood transfusion. She notes that she dizziness presents with a feeling of warmness and that she begins hyperventilating, feeling nervous about her dizziness.  She notes that prior to her recent hospital stay, she never required a blood transfusion or IV iron replacement. She notes that as a child she had frequent nose bleeds, and was diagnosed with anemia. She also notes a history of ice picca.   She had a hysterectomy in 2012, and prior to this she had very heavy periods that began at age 80 and occurred almost constantly. She was on Depo and Mirena which did not help slow menstrual losses. Besides taking iron supplements while pregnant she has not taken PO iron replacement as she reports not tolerating it very well while pregnant.   She denies concern for black stools or blood in the stools and has not had a colonoscopy or  endoscopy before. The pt notes that she has not previously had a concern for stomach ulcers, but has acid reflux and takes Prevacid as needed for 7-8 years. She notes that she has not recently used Prevacid, for the last 3 months.  She is not taking vitamin replacements and denies any dietary restrictions.  The pt notes that she has been continuing to have lower right quadrant abdominal pain that radiates across her abdomen, presents for up to 45 minutes and occurs intermittently. She notes that her abdominal pain presented on 04/13/18.   The pt notes that was taking Ibuprofen 800mg  BID for 8 years to address her back pain related to her herniated disk. She stopped taking this 4 months ago.   Of note prior to the patient's visit today, pt has had CT A/P completed on 04/16/18 with results revealing No acute findings in the abdomen/pelvis. Least 4 low-density hepatic lesions, some of which may represent small cysts or hemangiomas, however there is a 19 mm lesion in the left lobe of the liver that is incompletely characterized. Recommend nonemergent hepatic protocol MRI for further evaluation.Marland Kitchen Possible 11 mm pancreatic nodule rising from the tail, alternatively this may represent a splenule. This can also be assessed at time of hepatic MRI. Gallstone without gallbladder inflammation. Colonic diverticulosis without diverticulitis.   Most recent lab results (04/16/18) of CBC is as follows: all values are WNL except for HGB at 9.5, HCT at 33.6, MCH at 23.8, MCHC at 28.3, RDW at 23.2, PLT at  435k. Ferritin 04/09/18 was low at 6 Vitamin B12 on 04/09/18 was at 196  On review of systems, pt reports light headedness, dizziness, lower back pain, intermittent abdominal pain, and denies black stools, blood in the stools, tingling or numbness in her legs or arms, vaginal bleeding, and any other symptoms.  Interval History:   Margaret [name] returns today for management and evaluation of her Iron deficiency anemia and  B12 deficiency. The patient's last visit with [place] was on 04/28/18. The pt reports that she is doing well overall.   The pt reports that she will be seeing GI in October. She was able to receive both doses of her IV Injectafer infusion. She notes that she has continued to take her Vitamin B12. She has stopped taking Ibuprofen and denies having blood in the stools and black stools. She notes that she continues to feel fatigued, and adds that some of this she relates to her back and knee pain. She is taking 50mg  Tramadol and notes that this is not addressing her pain.   The pt notes that she was having 6 loose stools a day for a week after her IV Iron infusion, and notes that this has since resolved. She endorses 1-2 normal bowel movements each day currently.   The pt also notes that she is actively craving ice again.   Lab results today (06/09/18) of CBC w/diff, CMP, and Reticulocytes is as follows: all values are WNL except for RBC at 3.53, HGB at 9.4, HCT at 30.1, MCHC at 31.2, RDW at 20.0, Calcium at 8.8, Albumin at 2.8, AST at 11, Total bilirubin at <0.2. 06/09/18 Ferritin is 25 06/09/18 Vitamin B12 is 584  On review of systems, pt reports normal bowel movements, some fatigue, knee pain, back pain, and denies abdominal pain, blood in the stools, black stools, and any other symptoms.    MEDICAL HISTORY:  Past Medical History:  Diagnosis Date  . Allergic rhinitis   . Anxiety diagnosed in 1990  . Arthritis   . Asthma   . Eczema   . Hypertension   . Lower back pain   . Migraine     SURGICAL HISTORY: Past Surgical History:  Procedure Laterality Date  . ABDOMINAL HYSTERECTOMY      SOCIAL HISTORY: Social History   Socioeconomic History  . Marital status: Single    Spouse name: Not on file  . Number of children: Not on file  . Years of education: Not on file  . Highest education level: Not on file  Occupational History  . Not on file  Social Needs  . Financial resource strain: Not  on file  . Food insecurity:    Worry: Not on file    Inability: Not on file  . Transportation needs:    Medical: Not on file    Non-medical: Not on file  Tobacco Use  . Smoking status: Former Smoker    Last attempt to quit: 07/30/1999    Years since quitting: 18.8  . Smokeless tobacco: Never Used  Substance and Sexual Activity  . Alcohol use: No  . Drug use: No  . Sexual activity: Not on file  Lifestyle  . Physical activity:    Days per week: Not on file    Minutes per session: Not on file  . Stress: Not on file  Relationships  . Social connections:    Talks on phone: Not on file    Gets together: Not on file    Attends religious  service: Not on file    Active member of club or organization: Not on file    Attends meetings of clubs or organizations: Not on file    Relationship status: Not on file  . Intimate partner violence:    Fear of current or ex partner: Not on file    Emotionally abused: Not on file    Physically abused: Not on file    Forced sexual activity: Not on file  Other Topics Concern  . Not on file  Social History Narrative  . Not on file    FAMILY HISTORY: History reviewed. No pertinent family history.  ALLERGIES:  is allergic to latex; tylenol [acetaminophen]; and codeine.  MEDICATIONS:  Current Outpatient Medications  Medication Sig Dispense Refill  . clobetasol ointment (TEMOVATE) 1.61 % Apply 1 application topically 2 (two) times daily. Use for Lichen Sclerosis    . Cyanocobalamin (B-12) 1000 MCG SUBL Place 1,000 mcg under the tongue daily. 30 each 3  . diazepam (VALIUM) 5 MG tablet Take 5 mg by mouth as needed for anxiety (panic attacks).    . diclofenac sodium (VOLTAREN) 1 % GEL Apply 2 g topically 4 (four) times daily.    Marland Kitchen dicyclomine (BENTYL) 20 MG tablet Take 1 tablet (20 mg total) by mouth 2 (two) times daily. 20 tablet 0  . ergocalciferol (VITAMIN D2) 50000 UNITS capsule Take 50,000 Units by mouth once a week.    Marland Kitchen ibuprofen  (ADVIL,MOTRIN) 800 MG tablet Take 800 mg by mouth every 8 (eight) hours as needed for moderate pain. Take for back pain, knee pain or migraines    . lisinopril-hydrochlorothiazide (PRINZIDE,ZESTORETIC) 10-12.5 MG tablet Take 1 tablet by mouth daily.    . montelukast (SINGULAIR) 10 MG tablet TAKE ONE TABLET BY MOUTH at bedtime 30 tablet 5  . nystatin (NYSTATIN) powder Apply topically 2 (two) times daily. Use in skin folds and under breast    . nystatin-triamcinolone (MYCOLOG II) cream Apply 1 application topically 2 (two) times daily. Use in skin folds and under breast    . Olopatadine HCl (PATADAY) 0.2 % SOLN Apply to eye 2 (two) times daily. 2 drops 2 times daily in each eye    . pimecrolimus (ELIDEL) 1 % cream Apply 1 application topically 2 (two) times daily.    Marland Kitchen PROAIR HFA 108 (90 Base) MCG/ACT inhaler INHALE TWO puffs BY MOUTH into the lungs EVERY 4 HOURS AS NEEDED FOR WHEEZING OR shortness of breath 8.5 g 0  . SYMBICORT 160-4.5 MCG/ACT inhaler INHALE TWO PUFFS BY MOUTH TWICE DAILY (Patient taking differently: INHALE TWO PUFFS BY MOUTH TWICE DAILY AS NEEDED FOR SOB) 10.2 g 0  . tiZANidine (ZANAFLEX) 4 MG tablet Take 4 mg by mouth every 6 (six) hours as needed for muscle spasms.    . traMADol (ULTRAM) 50 MG tablet Take 50 mg by mouth every 6 (six) hours as needed for moderate pain (Can take every 4-6 hours).    . triamcinolone (NASACORT) 55 MCG/ACT AERO nasal inhaler Place 2 sprays into the nose daily as needed (for allergies).     . valACYclovir (VALTREX) 500 MG tablet Take 500 mg by mouth 2 (two) times daily as needed (for cold sores).     . zolpidem (AMBIEN) 5 MG tablet Take 5 mg by mouth at bedtime as needed for sleep.     No current facility-administered medications for this visit.     REVIEW OF SYSTEMS:    A 10+ POINT REVIEW OF SYSTEMS WAS OBTAINED  including neurology, dermatology, psychiatry, cardiac, respiratory, lymph, extremities, GI, GU, Musculoskeletal, constitutional, breasts,  reproductive, HEENT.  All pertinent positives are noted in the HPI.  All others are negative.   PHYSICAL EXAMINATION:  . Vitals:   06/09/18 1421  BP: (!) 143/99  Pulse: 89  Resp: 20  Temp: 98 F (36.7 C)  SpO2: 100%   Filed Weights   06/09/18 1421  Weight: (!) 373 lb 3.2 oz (169.3 kg)   .Body mass index is 53.55 kg/m.  GENERAL:alert, in no acute distress and comfortable SKIN: no acute rashes, no significant lesions EYES: conjunctiva are pink and non-injected, sclera anicteric OROPHARYNX: MMM, no exudates, no oropharyngeal erythema or ulceration NECK: supple, no JVD LYMPH:  no palpable lymphadenopathy in the cervical, axillary or inguinal regions LUNGS: clear to auscultation b/l with normal respiratory effort HEART: regular rate & rhythm ABDOMEN:  normoactive bowel sounds , non tender, not distended. No palpable hepatosplenomegaly.  Extremity: no pedal edema PSYCH: alert & oriented x 3 with fluent speech NEURO: no focal motor/sensory deficits   LABORATORY DATA:  I have reviewed the data as listed  . CBC Latest Ref Rng & Units 06/09/2018 04/28/2018 04/16/2018  WBC 3.9 - 10.3 K/uL 5.4 5.9 9.6  Hemoglobin 11.6 - 15.9 g/dL 9.4(L) 9.7(L) 9.5(L)  Hematocrit 34.8 - 46.6 % 30.1(L) 32.0(L) 33.6(L)  Platelets 145 - 400 K/uL 327 334 435(H)    . CMP Latest Ref Rng & Units 06/09/2018 04/28/2018 04/16/2018  Glucose 70 - 99 mg/dL 98 86 104(H)  BUN 6 - 20 mg/dL 11 10 13   Creatinine 0.44 - 1.00 mg/dL 0.79 0.85 1.07(H)  Sodium 135 - 145 mmol/L 145 145 145  Potassium 3.5 - 5.1 mmol/L 3.8 4.0 3.9  Chloride 98 - 111 mmol/L 108 110 107  CO2 22 - 32 mmol/L 29 24 22   Calcium 8.9 - 10.3 mg/dL 8.8(L) 9.0 9.2  Total Protein 6.5 - 8.1 g/dL 7.1 7.3 7.5  Total Bilirubin 0.3 - 1.2 mg/dL <0.2(L) 0.3 0.7  Alkaline Phos 38 - 126 U/L 78 78 65  AST 15 - 41 U/L 11(L) 11(L) 13(L)  ALT 0 - 44 U/L 10 11 11    . Lab Results  Component Value Date   IRON 14 (L) 04/09/2018   TIBC 346 04/09/2018    IRONPCTSAT 4 (L) 04/09/2018   (Iron and TIBC)  Lab Results  Component Value Date   FERRITIN 6 (L) 04/09/2018   B12  - 196--> 584   RADIOGRAPHIC STUDIES: I have personally reviewed the radiological images as listed and agreed with the findings in the report. No results found.  ASSESSMENT & PLAN:   47 y.o. female with  1. Iron Deficiency Anemia - ? Previous heavy periods vs GI losses . Recent heavy NSAID use ? Ulcer 2. B12 deficiency  04/16/18 CT A/P with pt revealed No acute findings in the abdomen/pelvis.  Least 4 low-density hepatic lesions, some of which may represent small cysts or hemangiomas, however there is a 19 mm lesion in the left lobe of the liver that is incompletely characterized. Recommend nonemergent hepatic protocol MRI for further evaluation.Marland Kitchen Possible 11 mm pancreatic nodule rising from the tail, alternatively this may represent a splenule. This can also be assessed at time of hepatic MRI. Gallstone without gallbladder inflammation. Colonic diverticulosis without diverticulitis.   Previous recent labs upon initial presentation from 04/16/18, HGB at 9.5. The 04/09/18 Ferritin was low at 6 and Vitamin B12 was at 196  -No antiparietal antibodies, no intrinsic factor  antibodies, normal TSH all from 04/28/18 -Will see pt back in 3 months   PLAN -Continue NSAID avoidance  -continue 1010mcg Vitamin B12 sublingually daily - B12 improved from 196 to 584 -Discussed pt labwork today, 06/09/18; HGB stable at 9.4 -Ferritin is still low at 25 -will setup for additional IV Injectafer weekly x 2 doses  3. Liver lesion -?cyst vs hemangioma vs other lesion.Pancreatic nodule Plan  -Deferring recommendation of MRI of liver to [facility] -Follow up with GI in October for liver, pancreas, and blood loss evaluation  RTC with Dr Irene Limbo in 3 months with labs   All of the patients questions were answered with apparent satisfaction. The patient knows to call the clinic with any problems,  questions or concerns.  The total time spent in the appt was 25 minutes and more than 50% was on counseling and direct patient cares.    Sullivan Lone MD MS AAHIVMS [facility] Hematology/Oncology Physician [facility]  (Office):       [phone] (Work cell):  [phone] (Fax):           [phone]  06/09/2018 3:09 PM  I, Baldwin Jamaica, am acting as a scribe for Dr. Irene Limbo  .I have reviewed the above documentation for accuracy and completeness, and I agree with the above. Brunetta Genera MD

## 2018-06-09 ENCOUNTER — Inpatient Hospital Stay: Payer: Medicaid Other

## 2018-06-09 ENCOUNTER — Telehealth: Payer: Self-pay | Admitting: Hematology

## 2018-06-09 ENCOUNTER — Encounter: Payer: Self-pay | Admitting: Hematology

## 2018-06-09 ENCOUNTER — Inpatient Hospital Stay: Payer: Medicaid Other | Attending: Hematology | Admitting: Hematology

## 2018-06-09 VITALS — BP 143/99 | HR 89 | Temp 98.0°F | Resp 20 | Ht 70.0 in | Wt 373.2 lb

## 2018-06-09 DIAGNOSIS — F419 Anxiety disorder, unspecified: Secondary | ICD-10-CM | POA: Diagnosis not present

## 2018-06-09 DIAGNOSIS — I10 Essential (primary) hypertension: Secondary | ICD-10-CM | POA: Insufficient documentation

## 2018-06-09 DIAGNOSIS — D509 Iron deficiency anemia, unspecified: Secondary | ICD-10-CM

## 2018-06-09 DIAGNOSIS — Z87891 Personal history of nicotine dependence: Secondary | ICD-10-CM | POA: Insufficient documentation

## 2018-06-09 DIAGNOSIS — E538 Deficiency of other specified B group vitamins: Secondary | ICD-10-CM

## 2018-06-09 DIAGNOSIS — D649 Anemia, unspecified: Secondary | ICD-10-CM

## 2018-06-09 DIAGNOSIS — Z79899 Other long term (current) drug therapy: Secondary | ICD-10-CM | POA: Diagnosis not present

## 2018-06-09 LAB — IRON AND TIBC
Iron: 26 ug/dL — ABNORMAL LOW (ref 41–142)
Saturation Ratios: 10 % — ABNORMAL LOW (ref 21–57)
TIBC: 260 ug/dL (ref 236–444)
UIBC: 234 ug/dL

## 2018-06-09 LAB — CMP (CANCER CENTER ONLY)
ALBUMIN: 2.8 g/dL — AB (ref 3.5–5.0)
ALT: 10 U/L (ref 0–44)
AST: 11 U/L — AB (ref 15–41)
Alkaline Phosphatase: 78 U/L (ref 38–126)
Anion gap: 8 (ref 5–15)
BUN: 11 mg/dL (ref 6–20)
CHLORIDE: 108 mmol/L (ref 98–111)
CO2: 29 mmol/L (ref 22–32)
Calcium: 8.8 mg/dL — ABNORMAL LOW (ref 8.9–10.3)
Creatinine: 0.79 mg/dL (ref 0.44–1.00)
GFR, Est AFR Am: >60 mL/min (ref >60)
GFR, Estimated: >60 mL/min (ref >60)
GLUCOSE: 98 mg/dL (ref 70–99)
Potassium: 3.8 mmol/L (ref 3.5–5.1)
SODIUM: 145 mmol/L (ref 135–145)
TOTAL PROTEIN: 7.1 g/dL (ref 6.5–8.1)
Total Bilirubin: <0.2 mg/dL — ABNORMAL LOW (ref 0.3–1.2)

## 2018-06-09 LAB — CBC WITH DIFFERENTIAL/PLATELET
Basophils Absolute: 0 10*3/uL (ref 0.0–0.1)
Basophils Relative: 0 %
EOS PCT: 1 %
Eosinophils Absolute: 0.1 10*3/uL (ref 0.0–0.5)
HEMATOCRIT: 30.1 % — AB (ref 34.8–46.6)
Hemoglobin: 9.4 g/dL — ABNORMAL LOW (ref 11.6–15.9)
LYMPHS ABS: 1.7 10*3/uL (ref 0.9–3.3)
LYMPHS PCT: 32 %
MCH: 26.6 pg (ref 25.1–34.0)
MCHC: 31.2 g/dL — AB (ref 31.5–36.0)
MCV: 85.2 fL (ref 79.5–101.0)
MONO ABS: 0.3 10*3/uL (ref 0.1–0.9)
MONOS PCT: 6 %
NEUTROS ABS: 3.3 10*3/uL (ref 1.5–6.5)
Neutrophils Relative %: 61 %
PLATELETS: 327 10*3/uL (ref 145–400)
RBC: 3.53 MIL/uL — ABNORMAL LOW (ref 3.70–5.45)
RDW: 20 % — AB (ref 11.2–14.5)
WBC: 5.4 10*3/uL (ref 3.9–10.3)

## 2018-06-09 LAB — FERRITIN: Ferritin: 25 ng/mL (ref 11–307)

## 2018-06-09 LAB — VITAMIN B12: VITAMIN B 12: 584 pg/mL (ref 180–914)

## 2018-06-09 NOTE — Telephone Encounter (Signed)
Gave pt avs and calendar  °

## 2018-06-10 ENCOUNTER — Telehealth: Payer: Self-pay | Admitting: *Deleted

## 2018-06-10 NOTE — Telephone Encounter (Signed)
Faxed Medicaid Form for transportation to [employer] [phone]. Received fax confirmation 06/10/18 at 1518.

## 2018-06-10 NOTE — Telephone Encounter (Signed)
Patient called. Stated she was in office to see Dr. Irene Limbo yesterday and was to give someone here a form from [employer] to verify transportation to appointments for Medicaid. She does not remember if she gave the form to anyone. Form not received by Dr. Grier Mitts office staff yesterday. She cannot find the form and asked that a letter be sent to the company with the information. Requested that the patient give [employer] the MD office fax # (Patient given number) and request [employer] to send the  form here to be filled out for patient appointments yesterday. The form will be faxed back to [employer] at fax number supplied by patient [phone]. Patient verbalized understanding.

## 2018-06-11 ENCOUNTER — Telehealth: Payer: Self-pay | Admitting: *Deleted

## 2018-06-11 ENCOUNTER — Other Ambulatory Visit (INDEPENDENT_AMBULATORY_CARE_PROVIDER_SITE_OTHER): Payer: Self-pay

## 2018-06-11 ENCOUNTER — Other Ambulatory Visit: Payer: Self-pay

## 2018-06-11 ENCOUNTER — Ambulatory Visit
Admission: RE | Admit: 2018-06-11 | Discharge: 2018-06-11 | Disposition: A | Discharge status: Home / Self Care | Payer: Medicaid Other | Source: Ambulatory Visit | Attending: Neurosurgery | Admitting: Neurosurgery

## 2018-06-11 DIAGNOSIS — M4316 Spondylolisthesis, lumbar region: Secondary | ICD-10-CM

## 2018-06-11 NOTE — Telephone Encounter (Signed)
Medication refill requests have been denied today. Margaret [name] is 6+ months overdue for an office visit in order to refill medications. I called and left a voicemail asking for her to return the call to discuss.

## 2018-06-11 NOTE — Telephone Encounter (Signed)
Medication refills were denied due to the patient needing to be seen for an office visit. The patient called back and informed that she has cancer and has a very booked schedule. She informed that she will call back at the beginning of October to schedule an appointment. She did state that she has 1 months supply left of the medications that are needed.

## 2018-06-15 ENCOUNTER — Telehealth: Payer: Self-pay

## 2018-06-15 NOTE — Telephone Encounter (Signed)
Called patient to let her know that per Dr. Irene Limbo her Ferritin level is low and consistent with iron deficiency anemia. Dr. Irene Limbo recommends two additional doses of IV iron. Patient verbalized understanding and agreed to 2 additional doses of iron. Scheduling message sent. Patient aware to expect a call from scheduling.

## 2018-06-16 ENCOUNTER — Telehealth: Payer: Self-pay | Admitting: Hematology

## 2018-06-16 NOTE — Telephone Encounter (Signed)
Scheduled appt per 10/1 sch message - pt is aware of appt date and time

## 2018-06-22 ENCOUNTER — Telehealth: Payer: Self-pay | Admitting: *Deleted

## 2018-06-22 NOTE — Telephone Encounter (Signed)
Unfortunately Margaret [name] has already received a courtesy refill and is unable to receive a courtesy refill. Called patient and informed her and she has scheduled an appointment to be made for her refills.       Please let pt know she needs an appt. If she has not received courtesy refill I am ok with that for the symbicort if it she out.    ----- Message -----  From: Willy Eddy, RN  Sent: 06/11/2018 12:54 PM EDT  To: Kennith Gain, MD  Subject: FW: Medication Renewal Request                ----- Message -----  From: Berneice Gandy  Sent: 06/11/2018 12:35 PM EDT  To: Mychart Admin  Subject: Medication Renewal Request              Margaret [name] would like a refill of the following medications:     pimecrolimus (ELIDEL) 1 % cream     Olopatadine HCl (PATADAY) 0.2 % SOLN    Preferred pharmacy: [place], [address]      Medication renewals requested in this message routed separately:     SYMBICORT 160-4.5 MCG/ACT inhaler Duard Brady Charmian Muff, MD]

## 2018-06-24 ENCOUNTER — Other Ambulatory Visit: Payer: Self-pay | Admitting: Gastroenterology

## 2018-06-24 DIAGNOSIS — K862 Cyst of pancreas: Secondary | ICD-10-CM

## 2018-07-02 ENCOUNTER — Inpatient Hospital Stay: Payer: Medicaid Other | Attending: Hematology

## 2018-07-02 VITALS — BP 161/80 | HR 72 | Temp 97.7°F | Resp 18

## 2018-07-02 DIAGNOSIS — D509 Iron deficiency anemia, unspecified: Secondary | ICD-10-CM | POA: Insufficient documentation

## 2018-07-02 MED ORDER — SODIUM CHLORIDE 0.9 % IV SOLN
750.0000 mg | Freq: Once | INTRAVENOUS | Status: AC
  Administered 2018-07-02: 750 mg via INTRAVENOUS
  Filled 2018-07-02: qty 15

## 2018-07-02 MED ORDER — SODIUM CHLORIDE 0.9 % IV SOLN
Freq: Once | INTRAVENOUS | Status: AC
  Administered 2018-07-02: 11:00:00 via INTRAVENOUS
  Filled 2018-07-02: qty 250

## 2018-07-02 NOTE — Patient Instructions (Signed)
Iron Sucrose injection (Venofer) What is this medicine? IRON SUCROSE (AHY ern SOO krohs) is an iron complex. Iron is used to make healthy red blood cells, which carry oxygen and nutrients throughout the body. This medicine is used to treat iron deficiency anemia in people with chronic kidney disease. This medicine may be used for other purposes; ask your health care provider or pharmacist if you have questions. COMMON BRAND NAME(S): Venofer What should I tell my health care provider before I take this medicine? They need to know if you have any of these conditions: -anemia not caused by low iron levels -heart disease -high levels of iron in the blood -kidney disease -liver disease -an unusual or allergic reaction to iron, other medicines, foods, dyes, or preservatives -pregnant or trying to get pregnant -breast-feeding How should I use this medicine? This medicine is for infusion into a vein. It is given by a health care professional in a hospital or clinic setting. Talk to your pediatrician regarding the use of this medicine in children. While this drug may be prescribed for children as young as 2 years for selected conditions, precautions do apply. Overdosage: If you think you have taken too much of this medicine contact a poison control center or emergency room at once. NOTE: This medicine is only for you. Do not share this medicine with others. What if I miss a dose? It is important not to miss your dose. Call your doctor or health care professional if you are unable to keep an appointment. What may interact with this medicine? Do not take this medicine with any of the following medications: -deferoxamine -dimercaprol -other iron products This medicine may also interact with the following medications: -chloramphenicol -deferasirox This list may not describe all possible interactions. Give your health care provider a list of all the medicines, herbs, non-prescription drugs, or  dietary supplements you use. Also tell them if you smoke, drink alcohol, or use illegal drugs. Some items may interact with your medicine. What should I watch for while using this medicine? Visit your doctor or healthcare professional regularly. Tell your doctor or healthcare professional if your symptoms do not start to get better or if they get worse. You may need blood work done while you are taking this medicine. You may need to follow a special diet. Talk to your doctor. Foods that contain iron include: whole grains/cereals, dried fruits, beans, or peas, leafy green vegetables, and organ meats (liver, kidney). What side effects may I notice from receiving this medicine? Side effects that you should report to your doctor or health care professional as soon as possible: -allergic reactions like skin rash, itching or hives, swelling of the face, lips, or tongue -breathing problems -changes in blood pressure -cough -fast, irregular heartbeat -feeling faint or lightheaded, falls -fever or chills -flushing, sweating, or hot feelings -joint or muscle aches/pains -seizures -swelling of the ankles or feet -unusually weak or tired Side effects that usually do not require medical attention (report to your doctor or health care professional if they continue or are bothersome): -diarrhea -feeling achy -headache -irritation at site where injected -nausea, vomiting -stomach upset -tiredness This list may not describe all possible side effects. Call your doctor for medical advice about side effects. You may report side effects to FDA at 1-800-FDA-1088. Where should I keep my medicine? This drug is given in a hospital or clinic and will not be stored at home. NOTE: This sheet is a summary. It may not cover all possible information.   If you have questions about this medicine, talk to your doctor, pharmacist, or health care provider.  2018 Elsevier/Gold Standard (2011-06-12 17:14:35)   

## 2018-07-06 ENCOUNTER — Emergency Department (HOSPITAL_COMMUNITY): Payer: Medicaid Other

## 2018-07-06 ENCOUNTER — Other Ambulatory Visit: Payer: Self-pay

## 2018-07-06 ENCOUNTER — Encounter (HOSPITAL_COMMUNITY): Payer: Self-pay

## 2018-07-06 ENCOUNTER — Ambulatory Visit (INDEPENDENT_AMBULATORY_CARE_PROVIDER_SITE_OTHER): Payer: Medicaid Other | Admitting: Allergy & Immunology

## 2018-07-06 ENCOUNTER — Encounter: Payer: Self-pay | Admitting: Allergy & Immunology

## 2018-07-06 ENCOUNTER — Emergency Department (HOSPITAL_COMMUNITY)
Admission: EM | Admit: 2018-07-06 | Discharge: 2018-07-06 | Disposition: A | Discharge status: Home / Self Care | Payer: Medicaid Other | Attending: Emergency Medicine | Admitting: Emergency Medicine

## 2018-07-06 VITALS — BP 112/94 | HR 78 | Resp 20 | Ht 68.0 in | Wt 369.2 lb

## 2018-07-06 DIAGNOSIS — J302 Other seasonal allergic rhinitis: Secondary | ICD-10-CM | POA: Diagnosis not present

## 2018-07-06 DIAGNOSIS — R0602 Shortness of breath: Secondary | ICD-10-CM

## 2018-07-06 DIAGNOSIS — J3089 Other allergic rhinitis: Secondary | ICD-10-CM | POA: Diagnosis not present

## 2018-07-06 DIAGNOSIS — Z91040 Latex allergy status: Secondary | ICD-10-CM | POA: Diagnosis not present

## 2018-07-06 DIAGNOSIS — J45909 Unspecified asthma, uncomplicated: Secondary | ICD-10-CM | POA: Diagnosis not present

## 2018-07-06 DIAGNOSIS — J4540 Moderate persistent asthma, uncomplicated: Secondary | ICD-10-CM | POA: Diagnosis not present

## 2018-07-06 DIAGNOSIS — R0600 Dyspnea, unspecified: Secondary | ICD-10-CM | POA: Diagnosis not present

## 2018-07-06 DIAGNOSIS — Z79899 Other long term (current) drug therapy: Secondary | ICD-10-CM | POA: Diagnosis not present

## 2018-07-06 DIAGNOSIS — I10 Essential (primary) hypertension: Secondary | ICD-10-CM | POA: Insufficient documentation

## 2018-07-06 DIAGNOSIS — Z87891 Personal history of nicotine dependence: Secondary | ICD-10-CM | POA: Diagnosis not present

## 2018-07-06 LAB — URINALYSIS, ROUTINE W REFLEX MICROSCOPIC
BILIRUBIN URINE: NEGATIVE
Glucose, UA: NEGATIVE mg/dL
Ketones, ur: NEGATIVE mg/dL
Leukocytes, UA: NEGATIVE
NITRITE: NEGATIVE
PROTEIN: NEGATIVE mg/dL
Specific Gravity, Urine: 1.019 (ref 1.005–1.030)
pH: 7 (ref 5.0–8.0)

## 2018-07-06 LAB — BASIC METABOLIC PANEL
Anion gap: 7 (ref 5–15)
BUN: 11 mg/dL (ref 6–20)
CALCIUM: 8.8 mg/dL — AB (ref 8.9–10.3)
CO2: 24 mmol/L (ref 22–32)
Chloride: 108 mmol/L (ref 98–111)
Creatinine, Ser: 0.73 mg/dL (ref 0.44–1.00)
Glucose, Bld: 85 mg/dL (ref 70–99)
Potassium: 3.9 mmol/L (ref 3.5–5.1)
SODIUM: 139 mmol/L (ref 135–145)

## 2018-07-06 LAB — CBC WITH DIFFERENTIAL/PLATELET
ABS IMMATURE GRANULOCYTES: 0.14 10*3/uL — AB (ref 0.00–0.07)
BASOS PCT: 0 %
Basophils Absolute: 0 10*3/uL (ref 0.0–0.1)
Eosinophils Absolute: 0 10*3/uL (ref 0.0–0.5)
Eosinophils Relative: 0 %
HCT: 36.9 % (ref 36.0–46.0)
Hemoglobin: 10.1 g/dL — ABNORMAL LOW (ref 12.0–15.0)
IMMATURE GRANULOCYTES: 1 %
Lymphocytes Relative: 18 %
Lymphs Abs: 2.3 10*3/uL (ref 0.7–4.0)
MCH: 25.3 pg — ABNORMAL LOW (ref 26.0–34.0)
MCHC: 27.4 g/dL — ABNORMAL LOW (ref 30.0–36.0)
MCV: 92.3 fL (ref 80.0–100.0)
MONOS PCT: 7 %
Monocytes Absolute: 0.8 10*3/uL (ref 0.1–1.0)
NEUTROS PCT: 74 %
Neutro Abs: 9.2 10*3/uL — ABNORMAL HIGH (ref 1.7–7.7)
PLATELETS: 448 10*3/uL — AB (ref 150–400)
RBC: 4 MIL/uL (ref 3.87–5.11)
RDW: 16.8 % — ABNORMAL HIGH (ref 11.5–15.5)
WBC: 12.5 10*3/uL — ABNORMAL HIGH (ref 4.0–10.5)
nRBC: 0 % (ref 0.0–0.2)

## 2018-07-06 LAB — D-DIMER, QUANTITATIVE (NOT AT ARMC): D DIMER QUANT: 0.4 ug{FEU}/mL (ref 0.00–0.50)

## 2018-07-06 LAB — I-STAT TROPONIN, ED: TROPONIN I, POC: 0.01 ng/mL (ref 0.00–0.08)

## 2018-07-06 LAB — I-STAT BETA HCG BLOOD, ED (MC, WL, AP ONLY): I-stat hCG, quantitative: <5 m[IU]/mL (ref <5)

## 2018-07-06 MED ORDER — CETIRIZINE HCL 10 MG PO TABS: 10.0000 mg | ORAL_TABLET | Freq: Every day | ORAL | 6 refills | Status: DC

## 2018-07-06 MED ORDER — PIMECROLIMUS 1 % EX CREA: 1.0000 "application " | TOPICAL_CREAM | Freq: Two times a day (BID) | CUTANEOUS | 6 refills | Status: AC

## 2018-07-06 MED ORDER — TRIAMCINOLONE ACETONIDE 55 MCG/ACT NA AERO: 2.0000 | INHALATION_SPRAY | Freq: Every day | NASAL | 3 refills | Status: DC | PRN

## 2018-07-06 MED ORDER — ALBUTEROL SULFATE HFA 108 (90 BASE) MCG/ACT IN AERS: INHALATION_SPRAY | RESPIRATORY_TRACT | 0 refills | Status: DC

## 2018-07-06 MED ORDER — MONTELUKAST SODIUM 10 MG PO TABS: 10.0000 mg | ORAL_TABLET | Freq: Every day | ORAL | 6 refills | Status: DC

## 2018-07-06 MED ORDER — BUDESONIDE-FORMOTEROL FUMARATE 160-4.5 MCG/ACT IN AERO: 2.0000 | INHALATION_SPRAY | Freq: Two times a day (BID) | RESPIRATORY_TRACT | 0 refills | Status: DC

## 2018-07-06 NOTE — ED Triage Notes (Signed)
Pt arrived via [facility] from allergists office c/o SOB.  Pt on 2L O2 [place] for comfort room air sats 96%.  Pt also c/o right sided chest pain on inhalation.

## 2018-07-06 NOTE — Discharge Instructions (Addendum)
Return for any problem.  Follow-up with your regular doctor as instructed in 2-3 days.  If you acutely feel worse please return immediately to the ED for evaluation.

## 2018-07-06 NOTE — ED Provider Notes (Signed)
[facility] EMERGENCY DEPARTMENT Provider Note   CSN: [id number] Arrival date & time: 07/06/18  1219     History   Chief Complaint Chief Complaint  Patient presents with  . Shortness of Breath    HPI Margaret [name] is a 47 y.o. female.  47 year old female with prior medical history as detailed below presents for evaluation of chronic shortness of breath.  Patient reports long-standing issues with feeling short of breath and winded.  This is been going on for at least the last 3 to 4 months.  She denies any recent fever.  She denies chest pain.  She reports that she saw her asthma doctor today and was told that her asthma was not the source of her symptoms.  She denies any worsening of her symptoms over the last 1 to 2 weeks.  The history is provided by the patient and medical records.  Shortness of Breath  This is a chronic problem. The average episode lasts 3 months. The problem occurs continuously.The current episode started more than 1 week ago. The problem has not changed since onset.Pertinent negatives include no fever, no cough, no chest pain, no leg pain and no leg swelling.    Past Medical History:  Diagnosis Date  . Allergic rhinitis   . Anxiety diagnosed in 1990  . Arthritis   . Asthma   . Eczema   . Hypertension   . Lower back pain   . Migraine     Patient Active Problem List   Diagnosis Date Noted  . Iron deficiency anemia 04/28/2018  . Symptomatic anemia 04/09/2018  . Chest pain 04/09/2018  . Anxiety   . Asthma   . Hypertension   . Migraine     Past Surgical History:  Procedure Laterality Date  . ABDOMINAL HYSTERECTOMY       OB History   None      Home Medications    Prior to Admission medications   Medication Sig Start Date End Date Taking? Authorizing Provider  albuterol (PROAIR HFA) 108 (90 Base) MCG/ACT inhaler INHALE TWO puffs BY MOUTH into the lungs EVERY 4 HOURS AS NEEDED FOR WHEEZING OR shortness of breath  07/06/18   Valentina Shaggy, MD  Boric Acid 4 % SOLN Place vaginally.    [provider]  budesonide-formoterol (SYMBICORT) 160-4.5 MCG/ACT inhaler Inhale 2 puffs into the lungs 2 (two) times daily. 07/06/18   Valentina Shaggy, MD  cetirizine (ZYRTEC) 10 MG tablet Take 1 tablet (10 mg total) by mouth daily. 07/06/18   Valentina Shaggy, MD  clobetasol ointment (TEMOVATE) 2.33 % Apply 1 application topically 2 (two) times daily. Use for Lichen Sclerosis    Bovard-Stuckert, Jody, MD  Cyanocobalamin (B-12) 1000 MCG SUBL Place 1,000 mcg under the tongue daily. 04/28/18   Brunetta Genera, MD  cyclobenzaprine (FLEXERIL) 10 MG tablet cyclobenzaprine 10 mg tablet  TAKE ONE TABLET BY MOUTH THREE TIMES DAILY AS NEEDED    [provider]  diazepam (VALIUM) 5 MG tablet Take 5 mg by mouth as needed for anxiety (panic attacks).    Nolene Ebbs, MD  diclofenac sodium (VOLTAREN) 1 % GEL Apply 2 g topically 4 (four) times daily.    [provider]  dicyclomine (BENTYL) 20 MG tablet Take 1 tablet (20 mg total) by mouth 2 (two) times daily. 04/16/18   Doristine Devoid, PA-C  ergocalciferol (VITAMIN D2) 50000 UNITS capsule Take 50,000 Units by mouth once a week.  [provider]  fluconazole (DIFLUCAN) 150 MG tablet TAKE ONE TABLET BY MOUTH EVERY WEEK AS NEEDED 06/01/18   [provider]  ibuprofen (ADVIL,MOTRIN) 800 MG tablet Take 800 mg by mouth every 8 (eight) hours as needed for moderate pain. Take for back pain, knee pain or migraines    Avbuere, Edwin, MD  lisinopril-hydrochlorothiazide (PRINZIDE,ZESTORETIC) 10-12.5 MG tablet Take 1 tablet by mouth daily.    Nolene Ebbs, MD  methocarbamol (ROBAXIN) 750 MG tablet Take 750 mg by mouth 3 (three) times daily.    [provider]  montelukast (SINGULAIR) 10 MG tablet Take 1 tablet (10 mg total) by mouth at bedtime. 07/06/18   Valentina Shaggy, MD  nystatin (NYSTATIN) powder Apply  topically 2 (two) times daily. Use in skin folds and under breast    Bovard-Stuckert, Jody, MD  nystatin-triamcinolone (MYCOLOG II) cream Apply 1 application topically 2 (two) times daily. Use in skin folds and under breast    Bovard-Stuckert, Jody, MD  Olopatadine HCl (PATADAY) 0.2 % SOLN Apply to eye 2 (two) times daily. 2 drops 2 times daily in each eye    Kozlow, Donnamarie Poag, MD  pantoprazole (PROTONIX) 40 MG tablet Take 40 mg by mouth every morning. 06/24/18   [provider]  phentermine (ADIPEX-P) 37.5 MG tablet phentermine 37.5 mg tablet  TAKE ONE TABLET BY MOUTH EVERY DAY    [provider]  pimecrolimus (ELIDEL) 1 % cream Apply 1 application topically 2 (two) times daily. 07/06/18   Valentina Shaggy, MD  tiZANidine (ZANAFLEX) 4 MG tablet Take 4 mg by mouth every 6 (six) hours as needed for muscle spasms.    [provider]  tobramycin-dexamethasone (TOBRADEX) ophthalmic ointment TobraDex 0.3 %-0.1 % eye ointment  APPLY ONE APPLICATORFUL TWICE DAILY    [provider]  traMADol (ULTRAM) 50 MG tablet Take 50 mg by mouth every 6 (six) hours as needed for moderate pain (Can take every 4-6 hours).    Nolene Ebbs, MD  triamcinolone (NASACORT) 55 MCG/ACT AERO nasal inhaler Place 2 sprays into the nose daily as needed (for allergies). 07/06/18   Valentina Shaggy, MD  triamcinolone cream (KENALOG) 0.1 % triamcinolone acetonide 0.1 % topical cream  APPLY TWICE DAILY AS NEEDED    [provider]  valACYclovir (VALTREX) 500 MG tablet Take 500 mg by mouth 2 (two) times daily as needed (for cold sores).     [provider]  zolpidem (AMBIEN) 5 MG tablet Take 5 mg by mouth at bedtime as needed for sleep.    [provider]    Family History History reviewed. No pertinent family history.  Social History Social History   Tobacco Use  . Smoking status: Former Smoker    Last attempt to quit: 07/30/1999    Years since quitting:  18.9  . Smokeless tobacco: Never Used  Substance Use Topics  . Alcohol use: No  . Drug use: No     Allergies   Latex and Codeine   Review of Systems Review of Systems  Constitutional: Negative for fever.  Respiratory: Positive for shortness of breath. Negative for cough.   Cardiovascular: Negative for chest pain and leg swelling.  All other systems reviewed and are negative.    Physical Exam Updated Vital Signs BP (!) 155/77   Pulse (!) 58   Temp (!) 97.4 F (36.3 C)   Resp 10   SpO2 99%   Physical Exam  Constitutional: She is oriented to person, place,  and time. She appears well-developed and well-nourished. No distress.  HENT:  Head: Normocephalic and atraumatic.  Mouth/Throat: Oropharynx is clear and moist.  Eyes: Pupils are equal, round, and reactive to light. Conjunctivae and EOM are normal.  Neck: Normal range of motion. Neck supple.  Cardiovascular: Normal rate, regular rhythm and normal heart sounds.  Pulmonary/Chest: Effort normal and breath sounds normal. No respiratory distress.  Abdominal: Soft. She exhibits no distension. There is no tenderness.  Musculoskeletal: Normal range of motion. She exhibits no edema or deformity.  Neurological: She is alert and oriented to person, place, and time.  Skin: Skin is warm and dry.  Psychiatric: She has a normal mood and affect.  Nursing note and vitals reviewed.    ED Treatments / Results  Labs (all labs ordered are listed, but only abnormal results are displayed) Labs Reviewed  BASIC METABOLIC PANEL - Abnormal; Notable for the following components:      Result Value   Calcium 8.8 (*)    All other components within normal limits  CBC WITH DIFFERENTIAL/PLATELET - Abnormal; Notable for the following components:   WBC 12.5 (*)    Hemoglobin 10.1 (*)    MCH 25.3 (*)    MCHC 27.4 (*)    RDW 16.8 (*)    Platelets 448 (*)    Neutro Abs 9.2 (*)    Abs Immature Granulocytes 0.14 (*)    All other components  within normal limits  D-DIMER, QUANTITATIVE (NOT AT [facility])  URINALYSIS, ROUTINE W REFLEX MICROSCOPIC  CBC WITH DIFFERENTIAL/PLATELET  I-STAT TROPONIN, ED  I-STAT BETA HCG BLOOD, ED ([facility], [facility], [facility] ONLY)    EKG None  Radiology Dg Chest 2 View  Result Date: 07/06/2018 CLINICAL DATA:  Shortness of breath.  Right-sided chest pain. EXAM: CHEST - 2 VIEW COMPARISON:  04/09/2018. FINDINGS: Mediastinum and hilar structures are normal. Lungs are clear. No pleural effusion or pneumothorax. Heart size normal. No acute bony abnormality. Degenerative change thoracic spine. IMPRESSION: No acute cardiopulmonary disease. Electronically Signed   By: Marcello Moores  Register   On: 07/06/2018 13:36    Procedures Procedures (including critical care time)  Medications Ordered in ED Medications - No data to display   Initial Impression / Assessment and Plan / ED Course  I have reviewed the triage vital signs and the nursing notes.  Pertinent labs & imaging results that were available during my care of the patient were reviewed by me and considered in my medical decision making (see chart for details).     MDM  Screen complete  Patient is presenting for evaluation of chronic shortness of breath.  Screening labs performed today do not suggest any acuity to her symptoms.  D-dimer is negative.  Chest x-ray does not show any acute process.  Troponin is negative.  Patient with a prior history of anemia and her hemoglobin today is 10.  I suspect that a majority of her symptoms may be secondary to deconditioning.  This was extensively discussed with the patient.  She understands the need for close follow-up.  Strict return precautions given and understood.  Final Clinical Impressions(s) / ED Diagnoses   Final diagnoses:  Dyspnea, unspecified type    ED Discharge Orders    None       Valarie Merino, MD 07/06/18 1442

## 2018-07-06 NOTE — Progress Notes (Signed)
FOLLOW UP  Date of Service/Encounter:  07/06/18   Assessment:   Moderate persistent asthma, uncomplicated  Seasonal and perennial allergic rhinitis  SOB (shortness of breath)  Complex medical history, including anemia as well as chronic back pain   Margaret [name] presents for a sick visit due to shortness of breath.  Her spirometry is completely normal today, making an asthma exacerbation unlikely.  She did have a chest x-ray that showed cardiomegaly, cardiac etiology needs to be considered.  She is also endorsing marked chest pain, which is concerning for a pulmonary embolism versus an MI.  Anemia can certainly cause tachypnea, but she is getting infusions of iron as well as red blood cells.  With a rather complicated picture and her mild hypoxia as well as tachypnea, I think it is prudent for her to go to the ER for an evaluation in case further interventions are needed.   Plan/Recommendations:   1. Moderate persistent asthma, uncomplicated - Lung testing looked phenomenal today, so I do not think that this is related to asthma. - We will not make any medication changes. - We need to send you to the ED for your shortness of breath and chest pain.  - I think that you need to see a Cardiologist as well.  - Daily controller medication(s): Singulair 10mg  daily and Symbicort 80/4.4mcg two puffs twice daily with spacer - Prior to physical activity: ProAir 2 puffs 10-15 minutes before physical activity. - Rescue medications: ProAir 4 puffs every 4-6 hours as needed - Asthma control goals:  * Full participation in all desired activities (may need albuterol before activity) * Albuterol use two time or less a week on average (not counting use with activity) * Cough interfering with sleep two time or less a month * Oral steroids no more than once a year * No hospitalizations  2. Seasonal and perennial allergic rhinitis - Continue with Nasacort 1-2 sprays per nostril daily. - Continue  with Zyrtec 10mg  daily.   3. Eczema - Continue with Elidel as needed.  - Continue with moisturizing twice daily.   4. Return in about 3 months (around 10/06/2018).  Subjective:   Margaret [name] is a 47 y.o. female presenting today for follow up of  Chief Complaint  Patient presents with  . Asthma    Margaret [name] has a history of the following: Patient Active Problem List   Diagnosis Date Noted  . Iron deficiency anemia 04/28/2018  . Symptomatic anemia 04/09/2018  . Chest pain 04/09/2018  . Anxiety   . Asthma   . Hypertension   . Migraine     History obtained from: chart review and patient.  Margaret [name] Primary Care Provider is Nolene Ebbs, MD.     Margaret [name] is a 47 y.o. female presenting for a follow up visit.  She was last seen approximately 1 year ago by Dr. Nelva Bush.  She is well-known in our clinic, but we seem to have lost her to follow-up for a year.  At the last visit, she was reporting worsening eczema.  She was also having worsening asthma symptoms appear and Dr. Nelva Bush advanced her controller to Symbicort 80/4.52 puffs twice daily.  For the atopic dermatitis, she prescribed Elidel twice daily as well as moisturizers and Zyrtec.  She also has a history of allergic rhinoconjunctivitis.  She has been on shots in the past but stopped due to insurance coverage problems.  Since the last visit, she has had a difficult time.  She tells me that she has had episodes of syncope and passing out over the last 6 to 12 months.  She ended up getting admitted in July 2019.  At that time, she was noted to have iron deficiency anemia.  She is being worked up for some type of blood disorder at this time, but she has needed both iron infusions and red blood cell infusions since that time.  When she first presented, her hemoglobin was 6.2.  She is going to be having a colonoscopy in November 2019.  Apparently, since that time, she has had shortness of breath and marked  sharp chest pain.  She did have a chest x-ray during that hospitalization that showed some cardiomegaly.  It does not seem that she has had a chest CT.  She did have an abdominal CT in August 2019 for different hospitalization for stomach pain.  This showed hepatic lesions as well as colonic diverticulosis.  She has also developed disc bulging in her back and will be having some steroid injections.  She has not had an echocardiogram.  She has seen no cardiologist.  She is here today because she thinks that the shortness of breath is related to her asthma.  She remains on her Symbicort 2 puffs in the morning 2 puffs at night.  Lately, she has been using her Ventolin multiple times a day due to the shortness of breath.  She has not needed any prednisone.  However, she is having trouble even walking across her apartment.  She has not gone to the ER for these issues.  She denies a history of reflux.  Her allergies are controlled with Nasacort as well as Zyrtec.  She also remains on Elidel and moisturizing for her eczema.  She does have a history of obstructive sleep apnea and uses a CPAP nightly.  Otherwise, there have been no changes to her past medical history, surgical history, family history, or social history.    Review of Systems: a 14-point review of systems is pertinent for what is mentioned in HPI.  Otherwise, all other systems were negative.  Constitutional: negative other than that listed in the HPI Eyes: negative other than that listed in the HPI Ears, nose, mouth, throat, and face: negative other than that listed in the HPI Respiratory: negative other than that listed in the HPI Cardiovascular: negative other than that listed in the HPI Gastrointestinal: negative other than that listed in the HPI Genitourinary: negative other than that listed in the HPI Integument: negative other than that listed in the HPI Hematologic: negative other than that listed in the HPI Musculoskeletal:  negative other than that listed in the HPI Neurological: negative other than that listed in the HPI Allergy/Immunologic: negative other than that listed in the HPI    Objective:   Blood pressure (!) 112/94, pulse 78, resp. rate 20, height 5\' 8"  (1.727 m), weight (!) 369 lb 3.2 oz (167.5 kg), SpO2 92 %. Body mass index is 56.14 kg/m.   Physical Exam:  General: Alert, interactive, in mild acute distress. Obese. Tachypneic.  Eyes: No conjunctival injection bilaterally, no discharge on the right, no discharge on the left and no Horner-Trantas dots present. PERRL bilaterally. EOMI without pain. No photophobia.  Ears: Right TM pearly gray with normal light reflex, Left TM pearly gray with normal light reflex, Right TM intact without perforation and Left TM intact without perforation.  Nose/Throat: External nose within normal limits and septum midline. Turbinates edematous with clear discharge. Posterior oropharynx  erythematous without cobblestoning in the posterior oropharynx. Tonsils 2+ without exudates.  Tongue without thrush. Lungs: Decreased breath sounds bilaterally without wheezing, rhonchi or rales. Increased work of breathing. CV: Normal S1/S2. No murmurs. Capillary refill <2 seconds.  Skin: Warm and dry, without lesions or rashes. Neuro:   Grossly intact. No focal deficits appreciated. Responsive to questions.  Diagnostic studies:   Spirometry: results normal (FEV1: 2.46/90%, FVC: 2.83/83%, FEV1/FVC: 87%).    Spirometry consistent with normal pattern.  Allergy Studies: none     Allergy testing results were read and interpreted by myself, documented by clinical staff.      Salvatore Marvel, MD  [facility]

## 2018-07-06 NOTE — Patient Instructions (Addendum)
1. Moderate persistent asthma, uncomplicated - Lung testing looked phenomenal today, so I do not think that this is related to asthma. - We will not make any medication changes. - We need to send you to the ED for your shortness of breath and chest pain.  - I think that you need to see a Cardiologist as well.  - Daily controller medication(s): Singulair 10mg  daily and Symbicort 80/4.13mcg two puffs twice daily with spacer - Prior to physical activity: ProAir 2 puffs 10-15 minutes before physical activity. - Rescue medications: ProAir 4 puffs every 4-6 hours as needed - Asthma control goals:  * Full participation in all desired activities (may need albuterol before activity) * Albuterol use two time or less a week on average (not counting use with activity) * Cough interfering with sleep two time or less a month * Oral steroids no more than once a year * No hospitalizations  2. Seasonal and perennial allergic rhinitis - Continue with Nasacort 1-2 sprays per nostril daily. - Continue with Zyrtec 10mg  daily.   3. Eczema - Continue with Elidel as needed.  - Continue with moisturizing twice daily.   4. Return in about 3 months (around 10/06/2018).   Please inform us of any Emergency Department visits, hospitalizations, or changes in symptoms. Call us before going to the ED for breathing or allergy symptoms since we might be able to fit you in for a sick visit. Feel free to contact us anytime with any questions, problems, or concerns.  It was a pleasure to meet you today!  Websites that have reliable patient information: 1. American Academy of Asthma, Allergy, and Immunology: www.aaaai.org 2. Food Allergy Research and Education (FARE): foodallergy.org 3. Mothers of Asthmatics: http://www.asthmacommunitynetwork.org 4. American College of Allergy, Asthma, and Immunology: MonthlyElectricBill.co.uk   Make sure you are registered to vote! If you have moved or changed any of your contact information, you  will need to get this updated before voting!

## 2018-07-07 ENCOUNTER — Ambulatory Visit
Admission: RE | Admit: 2018-07-07 | Discharge: 2018-07-07 | Disposition: A | Discharge status: Home / Self Care | Payer: Medicaid Other | Source: Ambulatory Visit | Attending: Gastroenterology | Admitting: Gastroenterology

## 2018-07-07 DIAGNOSIS — K862 Cyst of pancreas: Secondary | ICD-10-CM

## 2018-07-07 MED ORDER — GADOBENATE DIMEGLUMINE 529 MG/ML IV SOLN
20.0000 mL | Freq: Once | INTRAVENOUS | Status: AC | PRN
  Administered 2018-07-07: 20 mL via INTRAVENOUS

## 2018-07-08 ENCOUNTER — Other Ambulatory Visit: Payer: Self-pay | Admitting: Hematology

## 2018-07-08 DIAGNOSIS — C787 Secondary malignant neoplasm of liver and intrahepatic bile duct: Secondary | ICD-10-CM

## 2018-07-09 ENCOUNTER — Telehealth: Payer: Self-pay

## 2018-07-09 ENCOUNTER — Inpatient Hospital Stay: Payer: Medicaid Other

## 2018-07-09 VITALS — BP 119/65 | HR 67 | Temp 98.5°F | Resp 18

## 2018-07-09 DIAGNOSIS — C787 Secondary malignant neoplasm of liver and intrahepatic bile duct: Secondary | ICD-10-CM

## 2018-07-09 DIAGNOSIS — D509 Iron deficiency anemia, unspecified: Secondary | ICD-10-CM | POA: Diagnosis not present

## 2018-07-09 LAB — CEA (IN HOUSE-CHCC): CEA (CHCC-IN HOUSE): 1.32 ng/mL (ref 0.00–5.00)

## 2018-07-09 LAB — LACTATE DEHYDROGENASE: LDH: 138 U/L (ref 98–192)

## 2018-07-09 MED ORDER — SODIUM CHLORIDE 0.9 % IV SOLN
Freq: Once | INTRAVENOUS | Status: AC
  Administered 2018-07-09: 11:00:00 via INTRAVENOUS
  Filled 2018-07-09: qty 250

## 2018-07-09 MED ORDER — SODIUM CHLORIDE 0.9 % IV SOLN
750.0000 mg | Freq: Once | INTRAVENOUS | Status: AC
  Administered 2018-07-09: 750 mg via INTRAVENOUS
  Filled 2018-07-09: qty 15

## 2018-07-09 NOTE — Telephone Encounter (Signed)
Per MD, request for pt to have lab work today. Appt made prior to infusion for 1015 by Maudie Mercury, Phlebotomy Reception. Additionally, PET to be scheduled for pt. Spoke with Vivien Rota in [employer]. Pt set up for 4pm on 07/16/18 at [facility]. Pt verbalized understanding of appts, to arrive at 3:30pm for PET, and to remain NPO for 6 hours prior to scan. Pt uses Medicaid transport and may have to get lab work after infusion this morning. Notified Kim, Phlebotomy of potential transportation conflict. Main line number provided [phone] for Medicaid to confirm appt for pt.

## 2018-07-09 NOTE — Patient Instructions (Signed)
Iron Sucrose injection (Venofer) What is this medicine? IRON SUCROSE (AHY ern SOO krohs) is an iron complex. Iron is used to make healthy red blood cells, which carry oxygen and nutrients throughout the body. This medicine is used to treat iron deficiency anemia in people with chronic kidney disease. This medicine may be used for other purposes; ask your health care provider or pharmacist if you have questions. COMMON BRAND NAME(S): Venofer What should I tell my health care provider before I take this medicine? They need to know if you have any of these conditions: -anemia not caused by low iron levels -heart disease -high levels of iron in the blood -kidney disease -liver disease -an unusual or allergic reaction to iron, other medicines, foods, dyes, or preservatives -pregnant or trying to get pregnant -breast-feeding How should I use this medicine? This medicine is for infusion into a vein. It is given by a health care professional in a hospital or clinic setting. Talk to your pediatrician regarding the use of this medicine in children. While this drug may be prescribed for children as young as 2 years for selected conditions, precautions do apply. Overdosage: If you think you have taken too much of this medicine contact a poison control center or emergency room at once. NOTE: This medicine is only for you. Do not share this medicine with others. What if I miss a dose? It is important not to miss your dose. Call your doctor or health care professional if you are unable to keep an appointment. What may interact with this medicine? Do not take this medicine with any of the following medications: -deferoxamine -dimercaprol -other iron products This medicine may also interact with the following medications: -chloramphenicol -deferasirox This list may not describe all possible interactions. Give your health care provider a list of all the medicines, herbs, non-prescription drugs, or  dietary supplements you use. Also tell them if you smoke, drink alcohol, or use illegal drugs. Some items may interact with your medicine. What should I watch for while using this medicine? Visit your doctor or healthcare professional regularly. Tell your doctor or healthcare professional if your symptoms do not start to get better or if they get worse. You may need blood work done while you are taking this medicine. You may need to follow a special diet. Talk to your doctor. Foods that contain iron include: whole grains/cereals, dried fruits, beans, or peas, leafy green vegetables, and organ meats (liver, kidney). What side effects may I notice from receiving this medicine? Side effects that you should report to your doctor or health care professional as soon as possible: -allergic reactions like skin rash, itching or hives, swelling of the face, lips, or tongue -breathing problems -changes in blood pressure -cough -fast, irregular heartbeat -feeling faint or lightheaded, falls -fever or chills -flushing, sweating, or hot feelings -joint or muscle aches/pains -seizures -swelling of the ankles or feet -unusually weak or tired Side effects that usually do not require medical attention (report to your doctor or health care professional if they continue or are bothersome): -diarrhea -feeling achy -headache -irritation at site where injected -nausea, vomiting -stomach upset -tiredness This list may not describe all possible side effects. Call your doctor for medical advice about side effects. You may report side effects to FDA at 1-800-FDA-1088. Where should I keep my medicine? This drug is given in a hospital or clinic and will not be stored at home. NOTE: This sheet is a summary. It may not cover all possible information.   If you have questions about this medicine, talk to your doctor, pharmacist, or health care provider.  2018 Elsevier/Gold Standard (2011-06-12 17:14:35)   

## 2018-07-10 LAB — CA 125: Cancer Antigen (CA) 125: 5.9 U/mL (ref 0.0–38.1)

## 2018-07-10 LAB — AFP TUMOR MARKER: AFP, Serum, Tumor Marker: 1.5 ng/mL (ref 0.0–8.3)

## 2018-07-10 LAB — CANCER ANTIGEN 19-9

## 2018-07-13 ENCOUNTER — Encounter: Payer: Self-pay | Admitting: *Deleted

## 2018-07-13 ENCOUNTER — Telehealth: Payer: Self-pay | Admitting: *Deleted

## 2018-07-13 NOTE — Telephone Encounter (Signed)
Pt called requesting letter be sent to [facility] stating pt will need someone with her for upcoming biopsy procedure.  Letter sent in attn to Yetta Flock fax # [phone].  Fax confirmation received.

## 2018-07-15 ENCOUNTER — Ambulatory Visit
Admission: RE | Admit: 2018-07-15 | Discharge: 2018-07-15 | Disposition: A | Discharge status: Home / Self Care | Payer: Medicaid Other | Source: Ambulatory Visit | Attending: Internal Medicine | Admitting: Internal Medicine

## 2018-07-15 DIAGNOSIS — Z1231 Encounter for screening mammogram for malignant neoplasm of breast: Secondary | ICD-10-CM

## 2018-07-16 ENCOUNTER — Ambulatory Visit (HOSPITAL_COMMUNITY)
Admission: RE | Admit: 2018-07-16 | Discharge: 2018-07-16 | Disposition: A | Discharge status: Home / Self Care | Payer: Medicaid Other | Source: Ambulatory Visit | Attending: Hematology | Admitting: Hematology

## 2018-07-16 DIAGNOSIS — C787 Secondary malignant neoplasm of liver and intrahepatic bile duct: Secondary | ICD-10-CM | POA: Diagnosis not present

## 2018-07-16 LAB — GLUCOSE, CAPILLARY: GLUCOSE-CAPILLARY: 79 mg/dL (ref 70–99)

## 2018-07-16 MED ORDER — FLUDEOXYGLUCOSE F - 18 (FDG) INJECTION
16.0300 | Freq: Once | INTRAVENOUS | Status: AC | PRN
  Administered 2018-07-16: 16.03 via INTRAVENOUS

## 2018-07-19 ENCOUNTER — Other Ambulatory Visit: Payer: Self-pay

## 2018-07-19 ENCOUNTER — Encounter (HOSPITAL_COMMUNITY): Payer: Self-pay | Admitting: *Deleted

## 2018-07-20 ENCOUNTER — Ambulatory Visit (HOSPITAL_COMMUNITY): Admission: RE | Admit: 2018-07-20 | Payer: Medicaid Other | Source: Ambulatory Visit | Admitting: Gastroenterology

## 2018-07-20 SURGERY — COLONOSCOPY WITH PROPOFOL
Anesthesia: Monitor Anesthesia Care

## 2018-07-20 NOTE — Progress Notes (Signed)
Pt unable to finish prep. Called on call doctor last pm and will call the office to reschedule.

## 2018-07-21 ENCOUNTER — Other Ambulatory Visit: Payer: Self-pay | Admitting: Physician Assistant

## 2018-07-22 ENCOUNTER — Encounter (HOSPITAL_COMMUNITY): Payer: Self-pay

## 2018-07-22 ENCOUNTER — Ambulatory Visit (HOSPITAL_COMMUNITY)
Admission: RE | Admit: 2018-07-22 | Discharge: 2018-07-22 | Disposition: A | Discharge status: Home / Self Care | Payer: Medicaid Other | Source: Ambulatory Visit | Attending: Hematology | Admitting: Hematology

## 2018-07-22 DIAGNOSIS — J45909 Unspecified asthma, uncomplicated: Secondary | ICD-10-CM | POA: Diagnosis not present

## 2018-07-22 DIAGNOSIS — F419 Anxiety disorder, unspecified: Secondary | ICD-10-CM | POA: Insufficient documentation

## 2018-07-22 DIAGNOSIS — Z7951 Long term (current) use of inhaled steroids: Secondary | ICD-10-CM | POA: Diagnosis not present

## 2018-07-22 DIAGNOSIS — K639 Disease of intestine, unspecified: Secondary | ICD-10-CM | POA: Insufficient documentation

## 2018-07-22 DIAGNOSIS — Z90710 Acquired absence of both cervix and uterus: Secondary | ICD-10-CM | POA: Diagnosis not present

## 2018-07-22 DIAGNOSIS — Z87891 Personal history of nicotine dependence: Secondary | ICD-10-CM | POA: Diagnosis not present

## 2018-07-22 DIAGNOSIS — Z885 Allergy status to narcotic agent status: Secondary | ICD-10-CM | POA: Insufficient documentation

## 2018-07-22 DIAGNOSIS — I10 Essential (primary) hypertension: Secondary | ICD-10-CM | POA: Insufficient documentation

## 2018-07-22 DIAGNOSIS — Z91040 Latex allergy status: Secondary | ICD-10-CM | POA: Insufficient documentation

## 2018-07-22 DIAGNOSIS — K769 Liver disease, unspecified: Secondary | ICD-10-CM | POA: Insufficient documentation

## 2018-07-22 DIAGNOSIS — Z79899 Other long term (current) drug therapy: Secondary | ICD-10-CM | POA: Insufficient documentation

## 2018-07-22 DIAGNOSIS — C787 Secondary malignant neoplasm of liver and intrahepatic bile duct: Secondary | ICD-10-CM

## 2018-07-22 LAB — CBC
HCT: 33.6 % — ABNORMAL LOW (ref 36.0–46.0)
Hemoglobin: 9.5 g/dL — ABNORMAL LOW (ref 12.0–15.0)
MCH: 27.7 pg (ref 26.0–34.0)
MCHC: 28.3 g/dL — ABNORMAL LOW (ref 30.0–36.0)
MCV: 98 fL (ref 80.0–100.0)
PLATELETS: 222 10*3/uL (ref 150–400)
RBC: 3.43 MIL/uL — AB (ref 3.87–5.11)
RDW: 19.3 % — ABNORMAL HIGH (ref 11.5–15.5)
WBC: 5.1 10*3/uL (ref 4.0–10.5)
nRBC: 0 % (ref 0.0–0.2)

## 2018-07-22 LAB — APTT: aPTT: 39 seconds — ABNORMAL HIGH (ref 24–36)

## 2018-07-22 LAB — PROTIME-INR
INR: 1.06
Prothrombin Time: 13.7 seconds (ref 11.4–15.2)

## 2018-07-22 MED ORDER — SODIUM CHLORIDE 0.9 % IV SOLN
INTRAVENOUS | Status: DC
  Administered 2018-07-22: 11:00:00 via INTRAVENOUS

## 2018-07-22 MED ORDER — LIDOCAINE HCL 1 % IJ SOLN
INTRAMUSCULAR | Status: AC
  Filled 2018-07-22: qty 20

## 2018-07-22 MED ORDER — FENTANYL CITRATE (PF) 100 MCG/2ML IJ SOLN
INTRAMUSCULAR | Status: AC
  Filled 2018-07-22: qty 2

## 2018-07-22 MED ORDER — MIDAZOLAM HCL 2 MG/2ML IJ SOLN
INTRAMUSCULAR | Status: AC | PRN
  Administered 2018-07-22 (×2): 1 mg via INTRAVENOUS

## 2018-07-22 MED ORDER — LIDOCAINE HCL (PF) 1 % IJ SOLN
INTRAMUSCULAR | Status: AC | PRN
  Administered 2018-07-22: 10 mL

## 2018-07-22 MED ORDER — MIDAZOLAM HCL 2 MG/2ML IJ SOLN
INTRAMUSCULAR | Status: AC
  Filled 2018-07-22: qty 4

## 2018-07-22 MED ORDER — FENTANYL CITRATE (PF) 100 MCG/2ML IJ SOLN
INTRAMUSCULAR | Status: AC | PRN
  Administered 2018-07-22 (×2): 50 ug via INTRAVENOUS

## 2018-07-22 NOTE — Procedures (Signed)
Interventional Radiology Procedure Note  Procedure: US guided biopsy of left lobe liver lesion  Complications: None  Estimated Blood Loss: < 10 mL  Findings: Very difficult to localize liver lesions by [place]. Lesion in tip of left lobe barely visible by [place]. Two 18 G core biopsy samples obtained via 17 G needle.  Venetia Night. Kathlene Cote, M.D Pager:  [phone]

## 2018-07-22 NOTE — Discharge Instructions (Signed)
Liver Biopsy, Care After °These instructions give you information on caring for yourself after your procedure. Your doctor may also give you more specific instructions. Call your doctor if you have any problems or questions after your procedure. °Follow these instructions at home: °· Rest at home for 1-2 days or as told by your doctor. °· Have someone stay with you for at least 24 hours. °· Do not do these things in the first 24 hours: °? Drive. °? Use machinery. °? Take care of other people. °? Sign legal documents. °? Take a bath or shower. °· There are many different ways to close and cover a cut (incision). For example, a cut can be closed with stitches, skin glue, or adhesive strips. Follow your doctor's instructions on: °? Taking care of your cut. °? Changing and removing your bandage (dressing). °? Removing whatever was used to close your cut. °· Do not drink alcohol in the first week. °· Do not lift more than 5 pounds or play contact sports for the first 2 weeks. °· Take medicines only as told by your doctor. For 1 week, do not take medicine that has aspirin in it or medicines like ibuprofen. °· Get your test results. °Contact a doctor if: °· A cut bleeds and leaves more than just a small spot of blood. °· A cut is red, puffs up (swells), or hurts more than before. °· Fluid or something else comes from a cut. °· A cut smells bad. °· You have a fever or chills. °Get help right away if: °· You have swelling, bloating, or pain in your belly (abdomen). °· You get dizzy or faint. °· You have a rash. °· You feel sick to your stomach (nauseous) or throw up (vomit). °· You have trouble breathing, feel short of breath, or feel faint. °· Your chest hurts. °· You have problems talking or seeing. °· You have trouble balancing or moving your arms or legs. °This information is not intended to replace advice given to you by your health care provider. Make sure you discuss any questions you have with your health care  provider. °Document Released: 06/10/2008 Document Revised: 02/07/2016 Document Reviewed: 10/28/2013 °Elsevier Interactive Patient Education © 2018 [employer] ° ° ° ° °Moderate Conscious Sedation, Adult, Care After °These instructions provide you with information about caring for yourself after your procedure. Your health care provider may also give you more specific instructions. Your treatment has been planned according to current medical practices, but problems sometimes occur. Call your health care provider if you have any problems or questions after your procedure. °What can I expect after the procedure? °After your procedure, it is common: °· To feel sleepy for several hours. °· To feel clumsy and have poor balance for several hours. °· To have poor judgment for several hours. °· To vomit if you eat too soon. ° °Follow these instructions at home: °For at least 24 hours after the procedure: ° °· Do not: °? Participate in activities where you could fall or become injured. °? Drive. °? Use heavy machinery. °? Drink alcohol. °? Take sleeping pills or medicines that cause drowsiness. °? Make important decisions or sign legal documents. °? Take care of children on your own. °· Rest. °Eating and drinking °· Follow the diet recommended by your health care provider. °· If you vomit: °? Drink water, juice, or soup when you can drink without vomiting. °? Make sure you have little or no nausea before eating solid foods. °General instructions °·   Have a responsible adult stay with you until you are awake and alert. °· Take over-the-counter and prescription medicines only as told by your health care provider. °· If you smoke, do not smoke without supervision. °· Keep all follow-up visits as told by your health care provider. This is important. °Contact a health care provider if: °· You keep feeling nauseous or you keep vomiting. °· You feel light-headed. °· You develop a rash. °· You have a fever. °Get help right away  if: °· You have trouble breathing. °This information is not intended to replace advice given to you by your health care provider. Make sure you discuss any questions you have with your health care provider. °Document Released: 06/22/2013 Document Revised: 02/04/2016 Document Reviewed: 12/22/2015 °Elsevier Interactive Patient Education © 2018 [employer] Inc[employer] ° ° °

## 2018-07-22 NOTE — H&P (Signed)
Chief Complaint: Patient was seen in consultation today for liver lesion biopsy.  Referring Physician(s): Brunetta Genera  Supervising Physician: Aletta Edouard  Patient Status: [facility] - Out-pt  History of Present Illness: Margaret [name] is a 47 y.o. female with a past medical history significant for anxiety, asthma, anemia followed by Dr. Irene Limbo, HTN, low back pain and migraines. She presented to [facility] ED on 04/16/18 with complaints of right sided abdominal pain for which she had a CT abdomen/pelvis with contrast performed which showed 4 low density hepatic lesions, possibly representing small cysts or hemangiomas, and a 19 mm lesion in the left lobe that was unable to be completely characterized. US abdomen was also performed during this visit however lesions noted on CT scan where unable to be appreciated. She was discharged to home with planned follow up with Dr. Irene Limbo and [facility].   Margaret [name] subsequently underwent MR abdomen on 07/07/18 for further evaluation of liver lesions seen on CT. MRI showed multiple hypovascular liver masses suspicious for liver metastases as well as 1.5 cm nonspecific nodular soft tissue density along the ventral aspect of the pancreatic tail. She then underwent PET scan on 07/16/18 which showed a solid hypermetabolic mass involving the cecum, worrisome for colonic primary neoplasm, as well as multiple hypermetabolic liver lesions compatible with metastatic disease. She presents today for biopsy of above mentioned liver lesion.  Patient reports she is still having right sided abdominal pain that waxes and wanes - she has not had any change in bowel habits, nausea, vomiting or change in appetite. She states she otherwise feels fine. She states understanding of procedure to be performed today and wishes to proceed.  Past Medical History:  Diagnosis Date  . Allergic rhinitis   . Anxiety diagnosed in 1990  . Arthritis   . Asthma   . Eczema   . Hypertension     . Lower back pain   . Migraine     Past Surgical History:  Procedure Laterality Date  . ABDOMINAL HYSTERECTOMY      Allergies: Latex; Codeine; and Tape  Medications: Prior to Admission medications   Medication Sig Start Date End Date Taking? Authorizing Provider  albuterol (PROAIR HFA) 108 (90 Base) MCG/ACT inhaler INHALE TWO puffs BY MOUTH into the lungs EVERY 4 HOURS AS NEEDED FOR WHEEZING OR shortness of breath Patient taking differently: Inhale 2 puffs into the lungs every 4 (four) hours as needed (asthma/prior to exercise.). INHALE TWO puffs BY MOUTH into the lungs EVERY 4 HOURS AS NEEDED FOR WHEEZING OR shortness of breath 07/06/18  Yes Valentina Shaggy, MD  budesonide-formoterol [facility]) 160-4.5 MCG/ACT inhaler Inhale 2 puffs into the lungs 2 (two) times daily. 07/06/18  Yes Valentina Shaggy, MD  cetirizine (ZYRTEC) 10 MG tablet Take 1 tablet (10 mg total) by mouth daily. 07/06/18  Yes Valentina Shaggy, MD  clobetasol ointment (TEMOVATE) 0.27 % Apply 1 application topically 2 (two) times daily. Use for Lichen Sclerosis   Yes Bovard-Stuckert, Jody, MD  Cyanocobalamin (B-12) 1000 MCG SUBL Place 1,000 mcg under the tongue daily. 04/28/18  Yes Brunetta Genera, MD  cyclobenzaprine (FLEXERIL) 10 MG tablet Take 10 mg by mouth 3 (three) times daily as needed (muscle spasms.).    Yes [provider]  diazepam (VALIUM) 5 MG tablet Take 5 mg by mouth daily as needed for anxiety (panic attacks).    Yes Nolene Ebbs, MD  diclofenac sodium (VOLTAREN) 1 % GEL Apply 2 g topically 4 (four)  times daily as needed (for back/knee pain.).    Yes [provider]  dicyclomine (BENTYL) 20 MG tablet Take 1 tablet (20 mg total) by mouth 2 (two) times daily. Patient taking differently: Take 20 mg by mouth 3 (three) times daily as needed (for abdominal pain.).  04/16/18  Yes Leaphart, Zack Seal, PA-C  methocarbamol (ROBAXIN) 750 MG tablet Take 750 mg by mouth every 8  (eight) hours as needed (back spasms.).    Yes [provider]  montelukast (SINGULAIR) 10 MG tablet Take 1 tablet (10 mg total) by mouth at bedtime. 07/06/18  Yes Valentina Shaggy, MD  nystatin (NYSTATIN) powder Apply 1 g topically 2 (two) times daily. Use in skin folds and under breast    Yes Bovard-Stuckert, Jody, MD  nystatin-triamcinolone (MYCOLOG II) cream Apply 1 application topically 2 (two) times daily. Use in skin folds and under breast   Yes Bovard-Stuckert, Jody, MD  pantoprazole (PROTONIX) 40 MG tablet Take 40 mg by mouth daily before breakfast.  06/24/18  Yes [provider]  pimecrolimus (ELIDEL) 1 % cream Apply 1 application topically 2 (two) times daily. 07/06/18  Yes Valentina Shaggy, MD  tiZANidine (ZANAFLEX) 4 MG tablet Take 4 mg by mouth every 6 (six) hours as needed for muscle spasms.   Yes [provider]  tobramycin-dexamethasone (TOBRADEX) ophthalmic ointment Place 1 application into both eyes 2 (two) times daily as needed (for eye pus pockets).    Yes [provider]  traMADol (ULTRAM) 50 MG tablet Take 50 mg by mouth every 6 (six) hours as needed for moderate pain (back pain.).    Yes Nolene Ebbs, MD  triamcinolone (NASACORT) 55 MCG/ACT AERO nasal inhaler Place 2 sprays into the nose daily as needed (for allergies). 07/06/18  Yes Valentina Shaggy, MD  triamcinolone cream (KENALOG) 0.1 % Apply 1 application topically 2 (two) times daily as needed (rash/irritated skin.).    Yes [provider]  valACYclovir (VALTREX) 500 MG tablet Take 500 mg by mouth 2 (two) times daily as needed (for cold sores).    Yes [provider]  zolpidem (AMBIEN) 5 MG tablet Take 5 mg by mouth at bedtime as needed for sleep.   Yes [provider]  Boric Acid 4 % SOLN Place 1 capsule vaginally every Friday.     [provider]  ergocalciferol (VITAMIN D2) 50000 UNITS capsule Take 50,000 Units by mouth every  Monday.     [provider]  ibuprofen (ADVIL,MOTRIN) 800 MG tablet Take 800 mg by mouth every 8 (eight) hours as needed (migraines/moderate pain.).     Nolene Ebbs, MD  Olopatadine HCl (PATADAY) 0.2 % SOLN Apply 2 drops to eye 2 (two) times daily as needed (for irritated eyes.).     Kozlow, Donnamarie Poag, MD  phentermine (ADIPEX-P) 37.5 MG tablet Take 37.5 mg by mouth daily before breakfast.     [provider]     History reviewed. No pertinent family history.  Social History   Socioeconomic History  . Marital status: Single    Spouse name: Not on file  . Number of children: Not on file  . Years of education: Not on file  . Highest education level: Not on file  Occupational History  . Not on file  Social Needs  . Financial resource strain: Not on file  . Food insecurity:    Worry: Not on file    Inability: Not on file  . Transportation needs:  Medical: Not on file    Non-medical: Not on file  Tobacco Use  . Smoking status: Former Smoker    Last attempt to quit: 07/30/1999    Years since quitting: 18.9  . Smokeless tobacco: Never Used  Substance and Sexual Activity  . Alcohol use: No  . Drug use: No  . Sexual activity: Not on file  Lifestyle  . Physical activity:    Days per week: Not on file    Minutes per session: Not on file  . Stress: Not on file  Relationships  . Social connections:    Talks on phone: Not on file    Gets together: Not on file    Attends religious service: Not on file    Active member of club or organization: Not on file    Attends meetings of clubs or organizations: Not on file    Relationship status: Not on file  Other Topics Concern  . Not on file  Social History Narrative  . Not on file     Review of Systems: A 12 point ROS discussed and pertinent positives are indicated in the HPI above.  All other systems are negative.  Review of Systems  Constitutional: Negative for appetite change, chills and fever.  Respiratory:  Negative for cough and shortness of breath.   Cardiovascular: Negative for chest pain.  Gastrointestinal: Positive for abdominal pain (right sided; chronic). Negative for abdominal distention, blood in stool, diarrhea, nausea and vomiting.  Genitourinary: Negative for dysuria and hematuria.  Skin: Negative for rash.  Neurological: Negative for dizziness and syncope.  Psychiatric/Behavioral: Negative for confusion.    Vital Signs: BP (!) 148/93 (BP Location: Right Arm)   Pulse 80   Temp 98.4 F (36.9 C) (Oral)   Resp 18   Ht 5\' 8"  (1.727 m)   Wt (!) 369 lb 3.2 oz (167.5 kg)   SpO2 100%   BMI 56.14 kg/m   Physical Exam  Constitutional: She is oriented to person, place, and time. No distress.  Cardiovascular: Normal rate, regular rhythm and normal heart sounds.  Pulmonary/Chest: Effort normal and breath sounds normal.  Abdominal: Soft. Bowel sounds are normal. She exhibits no distension and no mass. There is tenderness (RLQ and RUQ).  Neurological: She is alert and oriented to person, place, and time.  Skin: Skin is warm and dry. She is not diaphoretic.  Psychiatric: She has a normal mood and affect. Her behavior is normal. Judgment and thought content normal.  Vitals reviewed.    MD Evaluation Airway: WNL Heart: WNL Abdomen: WNL Chest/ Lungs: WNL ASA  Classification: 3 Mallampati/Airway Score: One   Imaging: Dg Chest 2 View  Result Date: 07/06/2018 CLINICAL DATA:  Shortness of breath.  Right-sided chest pain. EXAM: CHEST - 2 VIEW COMPARISON:  04/09/2018. FINDINGS: Mediastinum and hilar structures are normal. Lungs are clear. No pleural effusion or pneumothorax. Heart size normal. No acute bony abnormality. Degenerative change thoracic spine. IMPRESSION: No acute cardiopulmonary disease. Electronically Signed   By: Marcello Moores  Register   On: 07/06/2018 13:36   Mr Abdomen Wwo Contrast  Result Date: 07/08/2018 CLINICAL DATA:  Indeterminate hepatic and pancreatic lesions seen  on recent CT. EXAM: MRI ABDOMEN WITHOUT AND WITH CONTRAST TECHNIQUE: Multiplanar multisequence MR imaging of the abdomen was performed both before and after the administration of intravenous contrast. Note that 3T scanner malfunction precluded obtaining sequences with fat saturation. CONTRAST:  53mL MULTIHANCE GADOBENATE DIMEGLUMINE 529 MG/ML IV SOLN COMPARISON:  CT on 04/16/2018 FINDINGS: Lower chest:  No acute findings. Hepatobiliary: Multiple hypovascular heterogeneously enhancing masses are seen in the right, left, and caudate lobes. These lesions show mild-to-moderate T2 hyperintensity, and index lesion in the caudate lobe measures 2.2 x 1.6 cm on image 48/13. These are suspicious for liver metastases, or less likely multifocal hepatocellular carcinoma. A tiny gallstone is seen. No evidence of cholecystitis or biliary ductal dilatation. Pancreas: A 1.5 cm nodular soft tissue density is seen along the ventral aspect of the pancreatic tail on image 24/3. This is difficult to characterize due to its small size and has nonspecific characteristics. No other intra pancreatic lesions are seen and there is no evidence of pancreatic ductal dilatation. Spleen:  Within normal limits in size and appearance. Adrenals/Urinary Tract: A homogeneous fat signal intensity mass is seen involving the left adrenal gland which measures 2.3 cm, and is consistent with benign adrenal myelolipoma or lipoma. No renal masses identified. A few small benign right renal cysts are noted. No evidence of hydronephrosis. Stomach/Bowel: Small to moderate hiatal hernia. Vascular/Lymphatic: No pathologically enlarged lymph nodes identified. No abdominal aortic aneurysm. Other:  None. Musculoskeletal:  No suspicious bone lesions identified. IMPRESSION: Multiple hypovascular liver masses, suspicious for liver metastases, or less likely multifocal hepatocellular carcinoma. Consider further evaluation with colonoscopy and PET-CT scan. 1.5 cm nonspecific  nodular soft tissue density along the ventral aspect of the pancreatic tail. Differential diagnosis includes small pancreatic neoplasm, peripancreatic lymph node, or pseudocyst. This could be further assessed by PET-CT. Cholelithiasis.  No radiographic evidence of cholecystitis. Small to moderate hiatal hernia. Benign left adrenal myelolipoma or lipoma. These results will be called to the ordering clinician or representative by the Radiologist Assistant, and communication documented in the PACS or zVision Dashboard. Electronically Signed   By: Earle Gell M.D.   On: 07/08/2018 10:11   Nm Pet Image Initial (pi) Skull Base To Thigh  Result Date: 07/16/2018 CLINICAL DATA:  Initial treatment strategy for metastatic carcinoma of unknown primary. EXAM: NUCLEAR MEDICINE PET SKULL BASE TO THIGH TECHNIQUE: 16.03 mCi F-18 FDG was injected intravenously. Full-ring PET imaging was performed from the skull base to thigh after the radiotracer. CT data was obtained and used for attenuation correction and anatomic localization. Fasting blood glucose: 79 mg/dl COMPARISON:  MRI of the abdomen 07/07/2018 FINDINGS: Mediastinal blood pool activity: SUV max 4.37 NECK: No hypermetabolic lymph nodes in the neck. Incidental CT findings: none CHEST: There is a large hypodense nodule within the right lobe of thyroid gland measuring 4.3 cm. There may be a solid component within this nodule along the superior aspect which exhibits mild to moderate FDG uptake within SUV max of 5.27. Calcifications in the left lobe of thyroid gland noted. No hypermetabolic supraclavicular or axillary lymph nodes. No hypermetabolic mediastinal or hilar lymph nodes. Scar or subsegmental atelectasis noted in the right lower lobe. No hypermetabolic pulmonary nodules. Incidental CT findings: Mild aortic atherosclerosis. Small hiatal hernia. ABDOMEN/PELVIS: There are at least 6 hypermetabolic lesions within the liver. Along the anterior dome there is a lesion  measuring approximately 2.2 cm within SUV max of 25.19. Lesion within the lateral aspect of left lobe of liver measures approximately 2 cm within SUV max of 27.19. Posterior right lobe of liver lesion measures approximately 1.8 cm within SUV max of 16.7. Caudate lobe liver lesion measures 3.1 cm and has an SUV max of 26.5. No abnormal uptake within the pancreas or spleen. The adrenal glands are negative. No hypermetabolic lymph nodes within the abdomen or pelvis. No inguinal adenopathy.  Mass hypermetabolic mass within the cecum is identified measuring approximately 5.4 cm within SUV max of 38.6. No hypermetabolic pelvic or inguinal lymph nodes. Incidental CT findings: none SKELETON: No focal hypermetabolic activity to suggest skeletal metastasis. Incidental CT findings: none IMPRESSION: 1. There is a solid hypermetabolic mass involving the cecum, worrisome for colonic primary neoplasm. Correlation with colonoscopy findings. 2. Multiple hypermetabolic liver lesions compatible with metastatic disease. 3. Large low-density lesion in right lobe of thyroid gland. Advise further evaluation with thyroid sonography. 4.  Aortic Atherosclerosis (ICD10-I70.0). 5. Small hiatal hernia. Electronically Signed   By: Kerby Moors M.D.   On: 07/16/2018 20:33   Mm 3d Screen Breast Bilateral  Result Date: 07/15/2018 CLINICAL DATA:  Screening. EXAM: DIGITAL SCREENING BILATERAL MAMMOGRAM WITH TOMO AND CAD COMPARISON:  Previous exam(s). ACR Breast Density Category a: The breast tissue is almost entirely fatty. FINDINGS: There are no findings suspicious for malignancy. Images were processed with CAD. IMPRESSION: No mammographic evidence of malignancy. A result letter of this screening mammogram will be mailed directly to the patient. RECOMMENDATION: Screening mammogram in one year. (Code:[id number]) BI-RADS CATEGORY  1: Negative. Electronically Signed   By: Curlene Dolphin M.D.   On: 07/15/2018 16:55    Labs:  CBC: Recent Labs     04/28/18 1224 06/09/18 1319 07/06/18 1232 07/22/18 1103  WBC 5.9 5.4 12.5* 5.1  HGB 9.7* 9.4* 10.1* 9.5*  HCT 32.0* 30.1* 36.9 33.6*  PLT 334 327 448* 222    COAGS: No results for input(s): INR, APTT in the last 8760 hours.  BMP: Recent Labs    04/16/18 0032 04/28/18 1224 06/09/18 1319 07/06/18 1251  NA 145 145 145 139  K 3.9 4.0 3.8 3.9  CL 107 110 108 108  CO2 22 24 29 24   GLUCOSE 104* 86 98 85  BUN 13 10 11 11   CALCIUM 9.2 9.0 8.8* 8.8*  CREATININE 1.07* 0.85 0.79 0.73  GFRNONAA >60 >60 >60 >60  GFRAA >60 >60 >60 >60    LIVER FUNCTION TESTS: Recent Labs    04/16/18 0032 04/28/18 1224 06/09/18 1319  BILITOT 0.7 0.3 <0.2*  AST 13* 11* 11*  ALT 11 11 10   ALKPHOS 65 78 78  PROT 7.5 7.3 7.1  ALBUMIN 3.1* 3.0* 2.8*    TUMOR MARKERS: No results for input(s): AFPTM, CEA, CA199, CHROMGRNA in the last 8760 hours.  Assessment and Plan:  Patient with recently discovered liver lesion concerning for metastatic disease followed by Dr. Irene Limbo. She presents today for image guided biopsy of this liver lesion previously approved by Dr. Pascal Lux. She understands the indications for this procedure and wishes to proceed.  Last PO intake 11 pm yesterday, she does not take blood thinning medications. Afebrile, WBC 5.1, H/H 9.5/33.6, INR pending at time of this note writing.   Risks and benefits discussed with the patient including, but not limited to bleeding, infection, damage to adjacent structures or low yield requiring additional tests.  All of the patient's questions were answered, patient is agreeable to proceed.  Consent signed and in chart.   Thank you for this interesting consult.  I greatly enjoyed meeting Margaret [name] and look forward to participating in their care.  A copy of this report was sent to the requesting provider on this date.  Electronically Signed: Joaquim Nam, PA-C 07/22/2018, 11:03 AM   I spent a total of  30 Minutes   in face to face  in clinical consultation, greater than 50% of which was  counseling/coordinating care for liver lesion biopsy.

## 2018-07-25 ENCOUNTER — Encounter: Payer: Self-pay | Admitting: Hematology

## 2018-07-28 ENCOUNTER — Other Ambulatory Visit: Payer: Self-pay | Admitting: *Deleted

## 2018-07-28 MED ORDER — OLOPATADINE HCL 0.2 % OP SOLN: 2.0000 [drp] | Freq: Two times a day (BID) | OPHTHALMIC | 5 refills | Status: DC | PRN

## 2018-07-29 ENCOUNTER — Other Ambulatory Visit: Payer: Self-pay | Admitting: Gastroenterology

## 2018-07-30 ENCOUNTER — Telehealth: Payer: Self-pay

## 2018-07-30 MED ORDER — PATADAY 0.2 % OP SOLN: 2.0000 [drp] | Freq: Two times a day (BID) | OPHTHALMIC | 5 refills | Status: DC | PRN

## 2018-07-30 NOTE — Telephone Encounter (Signed)
Received fax for PA for Olopatadine HCI 0.2%. Insurance covers this medication but only brand name. New Rx sent in.

## 2018-08-05 ENCOUNTER — Emergency Department (HOSPITAL_COMMUNITY)
Admission: EM | Admit: 2018-08-05 | Discharge: 2018-08-05 | Disposition: A | Discharge status: Home / Self Care | Payer: Medicaid Other | Attending: Emergency Medicine | Admitting: Emergency Medicine

## 2018-08-05 ENCOUNTER — Other Ambulatory Visit: Payer: Self-pay | Admitting: Allergy & Immunology

## 2018-08-05 ENCOUNTER — Encounter (HOSPITAL_COMMUNITY): Payer: Self-pay | Admitting: Emergency Medicine

## 2018-08-05 DIAGNOSIS — Z79899 Other long term (current) drug therapy: Secondary | ICD-10-CM | POA: Insufficient documentation

## 2018-08-05 DIAGNOSIS — Z91040 Latex allergy status: Secondary | ICD-10-CM | POA: Insufficient documentation

## 2018-08-05 DIAGNOSIS — I10 Essential (primary) hypertension: Secondary | ICD-10-CM | POA: Insufficient documentation

## 2018-08-05 DIAGNOSIS — R040 Epistaxis: Secondary | ICD-10-CM | POA: Diagnosis not present

## 2018-08-05 DIAGNOSIS — Z87891 Personal history of nicotine dependence: Secondary | ICD-10-CM | POA: Insufficient documentation

## 2018-08-05 MED ORDER — CEPHALEXIN 500 MG PO CAPS: 500.0000 mg | ORAL_CAPSULE | Freq: Three times a day (TID) | ORAL | 0 refills | Status: AC

## 2018-08-05 MED ORDER — FLUCONAZOLE 200 MG PO TABS: 200.0000 mg | ORAL_TABLET | Freq: Once | ORAL | 0 refills | Status: AC

## 2018-08-05 MED ORDER — OXYMETAZOLINE HCL 0.05 % NA SOLN
1.0000 | Freq: Once | NASAL | Status: AC
  Administered 2018-08-05: 1 via NASAL
  Filled 2018-08-05: qty 15

## 2018-08-05 NOTE — ED Notes (Signed)
Bed: JS97 Expected date:  Expected time:  Means of arrival:  Comments: EMS nosebleed

## 2018-08-05 NOTE — ED Provider Notes (Signed)
[facility] DEPT Provider Note   CSN: [id number] Arrival date & time: 08/05/18  0645     History   Chief Complaint Chief Complaint  Patient presents with  . Epistaxis    HPI Margaret [name] is a 47 y.o. female.  The history is provided by the patient.  Epistaxis   This is a new problem. The current episode started less than 1 hour ago. The problem occurs rarely. The problem has been gradually improving. The problem is associated with an unknown factor. The bleeding has been from the right nare. She has tried applying pressure for the symptoms. The treatment provided moderate relief. Her past medical history does not include bleeding disorder, colds, sinus problems, allergies, nose-picking or frequent nosebleeds.    Past Medical History:  Diagnosis Date  . Allergic rhinitis   . Anxiety diagnosed in 1990  . Arthritis   . Asthma   . Eczema   . Hypertension   . Lower back pain   . Migraine     Patient Active Problem List   Diagnosis Date Noted  . Iron deficiency anemia 04/28/2018  . Symptomatic anemia 04/09/2018  . Chest pain 04/09/2018  . Anxiety   . Asthma   . Hypertension   . Migraine     Past Surgical History:  Procedure Laterality Date  . ABDOMINAL HYSTERECTOMY    . LIVER BIOPSY       OB History   None      Home Medications    Prior to Admission medications   Medication Sig Start Date End Date Taking? Authorizing Provider  albuterol (PROAIR HFA) 108 (90 Base) MCG/ACT inhaler INHALE TWO puffs BY MOUTH into the lungs EVERY 4 HOURS AS NEEDED FOR WHEEZING OR shortness of breath Patient taking differently: Inhale 2 puffs into the lungs every 4 (four) hours as needed (asthma/prior to exercise.). INHALE TWO puffs BY MOUTH into the lungs EVERY 4 HOURS AS NEEDED FOR WHEEZING OR shortness of breath 07/06/18   Valentina Shaggy, MD  Boric Acid 4 % SOLN Place 1 capsule vaginally every Friday.     [provider]    budesonide-formoterol (SYMBICORT) 160-4.5 MCG/ACT inhaler Inhale 2 puffs into the lungs 2 (two) times daily. 07/06/18   Valentina Shaggy, MD  cephALEXin (KEFLEX) 500 MG capsule Take 1 capsule (500 mg total) by mouth 3 (three) times daily for 7 days. 08/05/18 08/12/18  Darneisha Windhorst, DO  cetirizine (ZYRTEC) 10 MG tablet Take 1 tablet (10 mg total) by mouth daily. 07/06/18   Valentina Shaggy, MD  clobetasol ointment (TEMOVATE) 7.16 % Apply 1 application topically 2 (two) times daily. Use for Lichen Sclerosis    Bovard-Stuckert, Jody, MD  Cyanocobalamin (B-12) 1000 MCG SUBL Place 1,000 mcg under the tongue daily. 04/28/18   Brunetta Genera, MD  cyclobenzaprine (FLEXERIL) 10 MG tablet Take 10 mg by mouth 3 (three) times daily as needed (muscle spasms.).     [provider]  diazepam (VALIUM) 5 MG tablet Take 5 mg by mouth daily as needed for anxiety (panic attacks).     Nolene Ebbs, MD  diclofenac sodium (VOLTAREN) 1 % GEL Apply 2 g topically 4 (four) times daily as needed (for back/knee pain.).     [provider]  dicyclomine (BENTYL) 20 MG tablet Take 1 tablet (20 mg total) by mouth 2 (two) times daily. Patient taking differently: Take 20 mg by mouth 3 (three) times daily as needed (for abdominal pain.).  04/16/18  Doristine Devoid, PA-C  ergocalciferol (VITAMIN D2) 50000 UNITS capsule Take 50,000 Units by mouth every Monday.     [provider]  ibuprofen (ADVIL,MOTRIN) 800 MG tablet Take 800 mg by mouth every 8 (eight) hours as needed (migraines/moderate pain.).     Nolene Ebbs, MD  methocarbamol (ROBAXIN) 750 MG tablet Take 750 mg by mouth every 8 (eight) hours as needed (back spasms.).     [provider]  montelukast (SINGULAIR) 10 MG tablet Take 1 tablet (10 mg total) by mouth at bedtime. 07/06/18   Valentina Shaggy, MD  nystatin (NYSTATIN) powder Apply 1 g topically 2 (two) times daily. Use in skin folds and under breast      Bovard-Stuckert, Jody, MD  nystatin-triamcinolone (MYCOLOG II) cream Apply 1 application topically 2 (two) times daily. Use in skin folds and under breast    Bovard-Stuckert, Jody, MD  pantoprazole (PROTONIX) 40 MG tablet Take 40 mg by mouth daily before breakfast.  06/24/18   [provider]  PATADAY 0.2 % SOLN Apply 2 drops to eye 2 (two) times daily as needed (for irritated eyes.). 07/30/18   Valentina Shaggy, MD  phentermine (ADIPEX-P) 37.5 MG tablet Take 37.5 mg by mouth daily before breakfast.     [provider]  pimecrolimus (ELIDEL) 1 % cream Apply 1 application topically 2 (two) times daily. 07/06/18   Valentina Shaggy, MD  tiZANidine (ZANAFLEX) 4 MG tablet Take 4 mg by mouth every 6 (six) hours as needed for muscle spasms.    [provider]  tobramycin-dexamethasone [facility]) ophthalmic ointment Place 1 application into both eyes 2 (two) times daily as needed (for eye pus pockets).     [provider]  traMADol (ULTRAM) 50 MG tablet Take 50 mg by mouth every 6 (six) hours as needed for moderate pain (back pain.).     Nolene Ebbs, MD  triamcinolone (NASACORT) 55 MCG/ACT AERO nasal inhaler Place 2 sprays into the nose daily as needed (for allergies). 07/06/18   Valentina Shaggy, MD  triamcinolone cream (KENALOG) 0.1 % Apply 1 application topically 2 (two) times daily as needed (rash/irritated skin.).     [provider]  valACYclovir (VALTREX) 500 MG tablet Take 500 mg by mouth 2 (two) times daily as needed (for cold sores).     [provider]  zolpidem (AMBIEN) 5 MG tablet Take 5 mg by mouth at bedtime as needed for sleep.    [provider]    Family History No family history on file.  Social History Social History   Tobacco Use  . Smoking status: Former Smoker    Last attempt to quit: 07/30/1999    Years since quitting: 19.0  . Smokeless tobacco: Never Used  Substance Use Topics  . Alcohol use:  No  . Drug use: No     Allergies   Latex; Codeine; and Tape   Review of Systems Review of Systems  Constitutional: Negative for chills and fever.  HENT: Positive for nosebleeds. Negative for ear pain and sore throat.   Eyes: Negative for pain and visual disturbance.  Respiratory: Negative for cough and shortness of breath.   Cardiovascular: Negative for chest pain and palpitations.  Gastrointestinal: Negative for abdominal pain and vomiting.  Genitourinary: Negative for dysuria and hematuria.  Musculoskeletal: Negative for arthralgias and back pain.  Skin: Negative for color change and rash.  Neurological: Negative for seizures and syncope.  All other systems reviewed and are negative.  Physical Exam Updated Vital Signs Pulse 80   Temp 97.7 F (36.5 C)   Resp 16   Ht 5\' 6"  (1.676 m)   Wt (!) 167.5 kg   BMI 59.59 kg/m   Physical Exam  Constitutional: She appears well-developed and well-nourished. No distress.  HENT:  Head: Normocephalic and atraumatic.  Mild bleeding from right nare, no blood in oropharynx  Eyes: Pupils are equal, round, and reactive to light. Conjunctivae and EOM are normal.  Neck: Normal range of motion. Neck supple.  Cardiovascular: Normal rate, regular rhythm, normal heart sounds and intact distal pulses.  No murmur heard. Pulmonary/Chest: Effort normal and breath sounds normal. No respiratory distress.  Abdominal: Soft. Bowel sounds are normal. She exhibits no distension. There is no tenderness.  Musculoskeletal: Normal range of motion. She exhibits no edema.  Neurological: She is alert.  Skin: Skin is warm and dry.  Psychiatric: She has a normal mood and affect.  Nursing note and vitals reviewed.    ED Treatments / Results  Labs (all labs ordered are listed, but only abnormal results are displayed) Labs Reviewed - No data to display  EKG None  Radiology No results found.  Procedures .Epistaxis Management Date/Time: 08/05/2018  9:10 AM Performed by: Lennice Sites, DO Authorized by: Lennice Sites, DO   Consent:    Consent obtained:  Verbal   Consent given by:  Patient   Risks discussed:  Bleeding, infection, nasal injury and pain   Alternatives discussed:  No treatment Anesthesia (see MAR for exact dosages):    Anesthesia method:  None Procedure details:    Treatment site:  R septum   Treatment method:  Nasal balloon   Treatment complexity:  Extensive   Treatment episode: initial   Post-procedure details:    Assessment:  Bleeding stopped   Patient tolerance of procedure:  Tolerated well, no immediate complications   (including critical care time)  Medications Ordered in ED Medications  oxymetazoline (AFRIN) 0.05 % nasal spray 1 spray (1 spray Each Nare Given 08/05/18 0720)     Initial Impression / Assessment and Plan / ED Course  I have reviewed the triage vital signs and the nursing notes.  Pertinent labs & imaging results that were available during my care of the patient were reviewed by me and considered in my medical decision making (see chart for details).     Tamula SHANDRIA CLINCH is a 47 year old female with history of allergic rhinitis, hypertension, sleep apnea who wears CPAP at night who presents the ED with nosebleed.  Patient with normal vitals.  No fever.  Patient is not on any blood thinners.  Bleeding from the right nose started earlier this morning.  Has tried direct pressure without much relief.  EMS soaked Afrin gauze and placed in her right nare.  After 15 minutes of direct pressure with gauze and Afrin bleeding still did not stop.  Patient cleared out clots from her nose and under my evaluation did not see any obvious anterior bleeding.  No obvious posterior bleeding either.  No blood in the back of the throat.  Suspect likely septal bleed. After another attempt Afrin and direct pressure did not work patient had balloon packing placed without any complications.  At that time patient was  observed in ED for about 1 hour and no further bleeding occurred.  Patient was started on Keflex and given information to follow-up with ENT within the next 48 to 72 hours.  Was instructed to return to the ED if  unable to see ENT by the end of the third day.  She was told her return to ED if bleeding occurred again.  Discharged from ED in good condition and understands return precautions.  This chart was dictated using voice recognition software.  Despite best efforts to proofread,  errors can occur which can change the documentation meaning.   Final Clinical Impressions(s) / ED Diagnoses   Final diagnoses:  Right-sided epistaxis    ED Discharge Orders         Ordered    cephALEXin (KEFLEX) 500 MG capsule  3 times daily     08/05/18 0919           Lennice Sites, DO 08/05/18 0920

## 2018-08-05 NOTE — ED Triage Notes (Signed)
-  C/C nosebleed -About 2 hours ago, awoke from sleep with blood going down her throat, realized she was bleeding from her nose everywhere -Patient reported trying to pinch her nose and stop it but she reported it did not help -Per EMS they placed an Afrin- soaked gauze, some blood but dressing is not soaked   -Vitals  BP 123/93  P 80 RR 18 RA 100

## 2018-08-09 ENCOUNTER — Telehealth: Payer: Self-pay | Admitting: Hematology

## 2018-08-09 NOTE — Telephone Encounter (Signed)
Called patient per 11/25 sch message- unable to schedule due to patient and MD availability . Patient needs 5 day notice and can only be between 10:30 - 1:30pm times. Message given to RN - will call patient when follow up time is available.

## 2018-08-10 ENCOUNTER — Telehealth: Payer: Self-pay | Admitting: *Deleted

## 2018-08-10 DIAGNOSIS — R040 Epistaxis: Secondary | ICD-10-CM | POA: Insufficient documentation

## 2018-08-10 DIAGNOSIS — E079 Disorder of thyroid, unspecified: Secondary | ICD-10-CM | POA: Insufficient documentation

## 2018-08-10 NOTE — Telephone Encounter (Signed)
Patient called to ask if she could come see MD next week. Needs 5 days to set up Medicaid transportation. Per Dr. Irene Limbo, send scheduling message to have patient come next week. Contacted patient to inform that scheduling will call her to set up appt. Patient verbalized understanding.

## 2018-08-11 ENCOUNTER — Telehealth: Payer: Self-pay | Admitting: *Deleted

## 2018-08-11 ENCOUNTER — Other Ambulatory Visit: Payer: Self-pay | Admitting: Otolaryngology

## 2018-08-11 ENCOUNTER — Telehealth: Payer: Self-pay | Admitting: Hematology

## 2018-08-11 DIAGNOSIS — E041 Nontoxic single thyroid nodule: Secondary | ICD-10-CM

## 2018-08-11 NOTE — Telephone Encounter (Signed)
Called regarding 12/5

## 2018-08-11 NOTE — Telephone Encounter (Signed)
Contacted patient at Dr. Irene Limbo request. Colonoscopy rescheduled from 11/5 to 12/17 due to patient's inability to tolerate prep the first time. Prep changed to 2 day prep this time. Patient also being scheduled for head and neck MRI as follow up from ED visit for nosebleed 11/21. Advised patient that Dr. Irene Limbo had asked GI Navigator to review patient's case and assist where possible. Patient verbalized understanding.

## 2018-08-11 NOTE — Telephone Encounter (Signed)
Left VM with questions about her appointment with Dr. Irene Limbo. He wanted to see her after her colonoscopy.

## 2018-08-18 NOTE — Progress Notes (Signed)
HEMATOLOGY/ONCOLOGY CONSULTATION NOTE  Date of Service: 08/19/2018  Patient Care Team: Nolene Ebbs, MD as PCP - General (Internal Medicine)  CHIEF COMPLAINTS/PURPOSE OF CONSULTATION:  Iron Deficiency Anemia  HISTORY OF PRESENTING ILLNESS:   Margaret [name] is a wonderful 47 y.o. female who has been referred to [place] by Dr. Nolene Ebbs for evaluation and management of Iron deficiency anemia. The pt reports that she is doing well overall.   The pt recently presented to the ED on 04/16/18 for right sided abdominal pain that was evaluated with a CT A/P which revealed liver and pancreatic cyst, recommended for outpatient MRI follow up. The pt was found to have a UTI, was treated with Rocephin and discharged with Keflex. Prior to this the pt was admitted on 04/09/18 for symptomatic anemia, presenting with HGB at 6.2, received 2 units PRBCs, one IV Iron infusion, and was encouraged to seek out GI as outpatient.  She has an appointment with [facility] on 05/19/18.   The pt reports that she is still feeling dizzy and light headed and denies feeling better after her recent blood transfusion. She notes that she dizziness presents with a feeling of warmness and that she begins hyperventilating, feeling nervous about her dizziness.  She notes that prior to her recent hospital stay, she never required a blood transfusion or IV iron replacement. She notes that as a child she had frequent nose bleeds, and was diagnosed with anemia. She also notes a history of ice picca.   She had a hysterectomy in 2012, and prior to this she had very heavy periods that began at age 30 and occurred almost constantly. She was on Depo and Mirena which did not help slow menstrual losses. Besides taking iron supplements while pregnant she has not taken PO iron replacement as she reports not tolerating it very well while pregnant.   She denies concern for black stools or blood in the stools and has not had a colonoscopy or  endoscopy before. The pt notes that she has not previously had a concern for stomach ulcers, but has acid reflux and takes Prevacid as needed for 7-8 years. She notes that she has not recently used Prevacid, for the last 3 months.  She is not taking vitamin replacements and denies any dietary restrictions.  The pt notes that she has been continuing to have lower right quadrant abdominal pain that radiates across her abdomen, presents for up to 45 minutes and occurs intermittently. She notes that her abdominal pain presented on 04/13/18.   The pt notes that was taking Ibuprofen 800mg  BID for 8 years to address her back pain related to her herniated disk. She stopped taking this 4 months ago.   Of note prior to the patient's visit today, pt has had CT A/P completed on 04/16/18 with results revealing No acute findings in the abdomen/pelvis. Least 4 low-density hepatic lesions, some of which may represent small cysts or hemangiomas, however there is a 19 mm lesion in the left lobe of the liver that is incompletely characterized. Recommend nonemergent hepatic protocol MRI for further evaluation.Marland Kitchen Possible 11 mm pancreatic nodule rising from the tail, alternatively this may represent a splenule. This can also be assessed at time of hepatic MRI. Gallstone without gallbladder inflammation. Colonic diverticulosis without diverticulitis.   Most recent lab results (04/16/18) of CBC is as follows: all values are WNL except for HGB at 9.5, HCT at 33.6, MCH at 23.8, MCHC at 28.3, RDW at 23.2, PLT at  435k. Ferritin 04/09/18 was low at 6 Vitamin B12 on 04/09/18 was at 196  On review of systems, pt reports light headedness, dizziness, lower back pain, intermittent abdominal pain, and denies black stools, blood in the stools, tingling or numbness in her legs or arms, vaginal bleeding, and any other symptoms.  Interval History:   Margaret [name] returns today for management and evaluation of her concerning metastatic liver  lesions, Iron deficiency anemia and B12 deficiency. The patient's last visit with [place] was on 06/09/18. The pt reports that she is doing well overall.   In the interim, the patient obtained an MRI Abdomen on 07/08/18 to follow up on indeterminate pancreatic and liver lesions observed on an 04/16/18 CT A/P. The MRI was concerning for multiple liver masses suspicious for metastases, and a 1.5cm soft tissue density along ventral aspect of pancreatic tail. This was followed with a PET/CT completed on 07/16/18, as noted below, and a subsequent US guided liver biopsy on 07/22/18 which only revealed benign liver tissue. The pt is now anticipating a colonoscopy for 08/31/18, after previously scheduled colonoscopy on 07/20/18 was cancelled.   The pt reports that she became very sick through the work up of the previously scheduled colonoscopy and was not able to proceed with the procedure.   The pt notes that her significant epistaxis, which precipitated her 08/05/18 ED visit, produced significant blood clots. She has followed up with ENT Dr. Izora Gala. The pt denies using pain medications.   The pt denies any blood in the stools or black stools. The pt notes that she is moving her bowels regularly and denies diarrhea.   She notes that she has significant abdominal pain when eating food, reporting sharp, cramping pains when eating things like crackers.   The pt notes a family history of ovarian cancer, but denies a family history of colon cancer.   Of note since the patient's last visit, pt has had a PET/CT completed on 07/16/18 with results revealing There is a solid hypermetabolic mass involving the cecum, worrisome for colonic primary neoplasm. Correlation with colonoscopy Findings. 2. Multiple hypermetabolic liver lesions compatible with metastatic Disease. 3. Large low-density lesion in right lobe of thyroid gland. Advise further evaluation with thyroid sonography. 4.  Aortic Atherosclerosis. 5. Small hiatal  hernia.  Lab results (07/22/18) of CBC is as follows: all values are WNL except for RBC at 3.43, HGB at 9.5, HCT at 33.6, MCHC at 28.3, RDW at 19.3.  On review of systems, pt reports recent sever nose bleeds, abdominal pain when eating, stable energy levels, moving her bowels well, and denies blood in the stools, diarrhea, constipation, and any other symptoms.   MEDICAL HISTORY:  Past Medical History:  Diagnosis Date  . Allergic rhinitis   . Anxiety diagnosed in 1990  . Arthritis   . Asthma   . Eczema   . Hypertension   . Lower back pain   . Migraine     SURGICAL HISTORY: Past Surgical History:  Procedure Laterality Date  . ABDOMINAL HYSTERECTOMY    . LIVER BIOPSY      SOCIAL HISTORY: Social History   Socioeconomic History  . Marital status: Single    Spouse name: Not on file  . Number of children: Not on file  . Years of education: Not on file  . Highest education level: Not on file  Occupational History  . Not on file  Social Needs  . Financial resource strain: Not on file  . Food insecurity:  Worry: Not on file    Inability: Not on file  . Transportation needs:    Medical: Not on file    Non-medical: Not on file  Tobacco Use  . Smoking status: Former Smoker    Last attempt to quit: 07/30/1999    Years since quitting: 19.0  . Smokeless tobacco: Never Used  Substance and Sexual Activity  . Alcohol use: No  . Drug use: No  . Sexual activity: Not on file  Lifestyle  . Physical activity:    Days per week: Not on file    Minutes per session: Not on file  . Stress: Not on file  Relationships  . Social connections:    Talks on phone: Not on file    Gets together: Not on file    Attends religious service: Not on file    Active member of club or organization: Not on file    Attends meetings of clubs or organizations: Not on file    Relationship status: Not on file  . Intimate partner violence:    Fear of current or ex partner: Not on file    Emotionally  abused: Not on file    Physically abused: Not on file    Forced sexual activity: Not on file  Other Topics Concern  . Not on file  Social History Narrative  . Not on file    FAMILY HISTORY: No family history on file.  ALLERGIES:  is allergic to latex; tape; and tylenol with codeine #3 [acetaminophen-codeine].  MEDICATIONS:  Current Outpatient Medications  Medication Sig Dispense Refill  . albuterol (PROAIR HFA) 108 (90 Base) MCG/ACT inhaler INHALE TWO puffs BY MOUTH into the lungs EVERY 4 HOURS AS NEEDED FOR WHEEZING OR shortness of breath 18 g 1  . Boric Acid 4 % SOLN Place 1 capsule vaginally every Friday.     . cetirizine (ZYRTEC) 10 MG tablet Take 1 tablet (10 mg total) by mouth daily. 30 tablet 6  . clobetasol ointment (TEMOVATE) 2.59 % Apply 1 application topically 2 (two) times daily. Use for Lichen Sclerosis    . Cyanocobalamin (B-12) 1000 MCG SUBL Place 1,000 mcg under the tongue daily. 30 each 3  . cyclobenzaprine (FLEXERIL) 10 MG tablet Take 10 mg by mouth 3 (three) times daily as needed (muscle spasms.).     Marland Kitchen diazepam (VALIUM) 5 MG tablet Take 5 mg by mouth daily as needed for anxiety (panic attacks).     . diclofenac sodium (VOLTAREN) 1 % GEL Apply 2 g topically 4 (four) times daily as needed (for back/knee pain.).     Marland Kitchen dicyclomine (BENTYL) 20 MG tablet Take 1 tablet (20 mg total) by mouth 2 (two) times daily. (Patient taking differently: Take 20 mg by mouth 3 (three) times daily as needed (for abdominal pain.). ) 20 tablet 0  . ergocalciferol (VITAMIN D2) 50000 UNITS capsule Take 50,000 Units by mouth every Monday.     . methocarbamol (ROBAXIN) 750 MG tablet Take 750 mg by mouth every 8 (eight) hours as needed (back spasms.).     Marland Kitchen montelukast (SINGULAIR) 10 MG tablet Take 1 tablet (10 mg total) by mouth at bedtime. 30 tablet 6  . nystatin (NYSTATIN) powder Apply 1 g topically 2 (two) times daily. Use in skin folds and under breast     . nystatin-triamcinolone (MYCOLOG  II) cream Apply 1 application topically 2 (two) times daily. Use in skin folds and under breast    . pantoprazole (PROTONIX) 40 MG tablet  Take 40 mg by mouth daily before breakfast.   1  . PATADAY 0.2 % SOLN Apply 2 drops to eye 2 (two) times daily as needed (for irritated eyes.). 2.5 mL 5  . pimecrolimus (ELIDEL) 1 % cream Apply 1 application topically 2 (two) times daily. 30 g 6  . SYMBICORT 160-4.5 MCG/ACT inhaler Inhale 2 puffs into the lungs 2 (two) times daily. 10.2 g 3  . tiZANidine (ZANAFLEX) 4 MG tablet Take 4 mg by mouth every 6 (six) hours as needed for muscle spasms.    Marland Kitchen tobramycin-dexamethasone (TOBRADEX) ophthalmic ointment Place 1 application into both eyes 2 (two) times daily as needed (for eye pus pockets).     . traMADol (ULTRAM) 50 MG tablet Take 50 mg by mouth every 6 (six) hours as needed for moderate pain (back pain.).     Marland Kitchen triamcinolone (NASACORT) 55 MCG/ACT AERO nasal inhaler Place 2 sprays into the nose daily as needed (for allergies). 1 Inhaler 3  . triamcinolone cream (KENALOG) 0.1 % Apply 1 application topically 2 (two) times daily as needed (rash/irritated skin.).     Marland Kitchen valACYclovir (VALTREX) 500 MG tablet Take 500 mg by mouth 2 (two) times daily as needed (for cold sores).     . zolpidem (AMBIEN) 5 MG tablet Take 5 mg by mouth at bedtime as needed for sleep.     No current facility-administered medications for this visit.     REVIEW OF SYSTEMS:    A 10+ POINT REVIEW OF SYSTEMS WAS OBTAINED including neurology, dermatology, psychiatry, cardiac, respiratory, lymph, extremities, GI, GU, Musculoskeletal, constitutional, breasts, reproductive, HEENT.  All pertinent positives are noted in the HPI.  All others are negative.   PHYSICAL EXAMINATION:  . Vitals:   08/19/18 1150  BP: (!) 155/89  Pulse: 79  Resp: (!) 22  Temp: 97.9 F (36.6 C)  SpO2: 100%   Filed Weights   08/19/18 1150  Weight: (!) 363 lb 8 oz (164.9 kg)   .Body mass index is 58.67  kg/m.  GENERAL:alert, in no acute distress and comfortable SKIN: no acute rashes, no significant lesions EYES: conjunctiva are pink and non-injected, sclera anicteric OROPHARYNX: MMM, no exudates, no oropharyngeal erythema or ulceration NECK: supple, no JVD LYMPH:  no palpable lymphadenopathy in the cervical, axillary or inguinal regions LUNGS: clear to auscultation b/l with normal respiratory effort HEART: regular rate & rhythm ABDOMEN:  normoactive bowel sounds , non tender, not distended. No palpable hepatosplenomegaly.  Extremity: no pedal edema PSYCH: alert & oriented x 3 with fluent speech NEURO: no focal motor/sensory deficits   LABORATORY DATA:  I have reviewed the data as listed  . CBC Latest Ref Rng & Units 07/22/2018 07/06/2018 06/09/2018  WBC 4.0 - 10.5 K/uL 5.1 12.5(H) 5.4  Hemoglobin 12.0 - 15.0 g/dL 9.5(L) 10.1(L) 9.4(L)  Hematocrit 36.0 - 46.0 % 33.6(L) 36.9 30.1(L)  Platelets 150 - 400 K/uL 222 448(H) 327    . CMP Latest Ref Rng & Units 07/06/2018 06/09/2018 04/28/2018  Glucose 70 - 99 mg/dL 85 98 86  BUN 6 - 20 mg/dL 11 11 10   Creatinine 0.44 - 1.00 mg/dL 0.73 0.79 0.85  Sodium 135 - 145 mmol/L 139 145 145  Potassium 3.5 - 5.1 mmol/L 3.9 3.8 4.0  Chloride 98 - 111 mmol/L 108 108 110  CO2 22 - 32 mmol/L 24 29 24   Calcium 8.9 - 10.3 mg/dL 8.8(L) 8.8(L) 9.0  Total Protein 6.5 - 8.1 g/dL - 7.1 7.3  Total Bilirubin 0.3 -  1.2 mg/dL - <0.2(L) 0.3  Alkaline Phos 38 - 126 U/L - 78 78  AST 15 - 41 U/L - 11(L) 11(L)  ALT 0 - 44 U/L - 10 11   . Lab Results  Component Value Date   IRON 26 (L) 06/09/2018   TIBC 260 06/09/2018   IRONPCTSAT 10 (L) 06/09/2018   (Iron and TIBC)  Lab Results  Component Value Date   FERRITIN 25 06/09/2018   B12  - 196--> 584  07/22/18 Liver Biopsy:     RADIOGRAPHIC STUDIES: I have personally reviewed the radiological images as listed and agreed with the findings in the report. US Biopsy (liver)  Result Date:  07/22/2018 CLINICAL DATA:  Hypermetabolic liver lesions and cecal mass. EXAM: ULTRASOUND GUIDED CORE BIOPSY OF LIVER MEDICATIONS: 2.0 mg IV Versed; 100 mcg IV Fentanyl Total Moderate Sedation Time: 15 minutes. The patient's level of consciousness and physiologic status were continuously monitored during the procedure by Radiology nursing. PROCEDURE: The procedure, risks, benefits, and alternatives were explained to the patient. Questions regarding the procedure were encouraged and answered. The patient understands and consents to the procedure. A time out was performed prior to initiating the procedure. Ultrasound was performed of the liver. The abdominal wall was prepped with chlorhexidine in a sterile fashion, and a sterile drape was applied covering the operative field. A sterile gown and sterile gloves were used for the procedure. Local anesthesia was provided with 1% Lidocaine. A 17 gauge trocar needle was advanced under ultrasound guidance into the left lobe of the liver. Two separate coaxial 18 gauge core biopsy samples were obtained and submitted in formalin. The outer needle was removed and additional ultrasound performed. COMPLICATIONS: None. FINDINGS: It is very difficult to identify any discrete focal lesions in the liver by ultrasound due to the patient's body habitus. It was felt that the lesion in the tip of the lateral segment of the left lobe of the liver was able to be vaguely marginated and this area was sampled under ultrasound guidance. Delineation was still relatively poor and there may definitely be some sampling error based on poor visualization. IMPRESSION: Poor visualization of liver lesions by ultrasound due to the patient's body habitus. The lesion in the tip of the lateral segment of the left lobe was able to be vaguely identified and was sampled. Risk of sampling error based on poor visualization is felt to be higher than typical for image guided biopsy. Electronically Signed   By:  Aletta Edouard M.D.   On: 07/22/2018 16:24    ASSESSMENT & PLAN:   47 y.o. female with  1. Iron Deficiency Anemia - ? Previous heavy periods vs GI losses  Recent heavy NSAID use ? Ulcer  2. B12 deficiency Labs upon initial presentation from 04/16/18, HGB at 9.5. The 04/09/18 Ferritin was low at 6 and Vitamin B12 was at 196 -No antiparietal antibodies, no intrinsic factor antibodies, normal TSH all from 04/28/18  PLAN: -Continue NSAID avoidance  -continue 1059mcg Vitamin B12 sublingually daily - B12 improved from 196 to 584  3. Metastatic Liver lesions and pancreatic lesions  04/16/18 CT A/P with pt revealed No acute findings in the abdomen/pelvis.  Least 4 low-density hepatic lesions, some of which may represent small cysts or hemangiomas, however there is a 19 mm lesion in the left lobe of the liver that is incompletely characterized. Recommend nonemergent hepatic protocol MRI for further evaluation.Marland Kitchen Possible 11 mm pancreatic nodule rising from the tail, alternatively this may represent a splenule.  This can also be assessed at time of hepatic MRI. Gallstone without gallbladder inflammation. Colonic diverticulosis without diverticulitis.   PLAN: -Discussed pt labwork from 07/22/18; HGB at 9.5, normal WBC at 5.1k, normal PLT at 222k -Discussed the 07/16/18 PET/CT which revealed There is a solid hypermetabolic mass involving the cecum, worrisome for colonic primary neoplasm. Correlation with colonoscopy Findings. 2. Multiple hypermetabolic liver lesions compatible with metastatic disease. 3. Large low-density lesion in right lobe of thyroid gland. Advise further evaluation with thyroid sonography. 4.  Aortic Atherosclerosis. 5. Small hiatal hernia. -Discussed the 07/22/18 Liver biopsy results which revealed benign hepatic tissue, and was not diagnostic  -Discussed the possibility to repeat US Liver biopsy as her colonoscopy is still two weeks out, which she prefers -Discussed the 07/09/18 Tumor  marker work up: CEA normal at 1.32, AFP normal at 1.5, LDH normal at 138, CA 125 normal at 5.9, CA 19-9 normal at <2 -Proceed with 08/31/18 Colonoscopy with Dr. Alessandra Bevels for evaluation of PET/CT findings and tissue sample  -Proceed with 08/20/18 US Thyroid and possible biopsy -Discussed the concern of colon cancer given the mass involving the cecum, which would not necessarily secrete CEA -Recommend saline sprays OTC to prevent further epistaxis  -Recommend soft foods that are easy to digest -Will order labs today -Will order repeat US Liver biopsy, which the pt prefers -Will see the pt back on 09/03/18    Labs today IV Injectafer weekly x 2 ASAP Rpt US guided liver lesion biopsy RTC with labs as per appointment on 09/03/2018   All of the patients questions were answered with apparent satisfaction. The patient knows to call the clinic with any problems, questions or concerns.  The total time spent in the appt was 40 minutes and more than 50% was on counseling and direct patient cares.    Sullivan Lone MD MS AAHIVMS [facility] Hematology/Oncology Physician [facility]  (Office):       [phone] (Work cell):  [phone] (Fax):           [phone]  08/19/2018 1:07 PM  I, Baldwin Jamaica, am acting as a scribe for Dr. Sullivan Lone.   .I have reviewed the above documentation for accuracy and completeness, and I agree with the above. Brunetta Genera MD

## 2018-08-19 ENCOUNTER — Inpatient Hospital Stay: Payer: Medicaid Other

## 2018-08-19 ENCOUNTER — Other Ambulatory Visit: Payer: Self-pay | Admitting: Allergy & Immunology

## 2018-08-19 ENCOUNTER — Other Ambulatory Visit: Payer: Self-pay | Admitting: *Deleted

## 2018-08-19 ENCOUNTER — Telehealth: Payer: Self-pay

## 2018-08-19 ENCOUNTER — Inpatient Hospital Stay: Payer: Medicaid Other | Attending: Hematology | Admitting: Hematology

## 2018-08-19 VITALS — BP 155/89 | HR 79 | Temp 97.9°F | Resp 22 | Ht 66.0 in | Wt 363.5 lb

## 2018-08-19 DIAGNOSIS — J4540 Moderate persistent asthma, uncomplicated: Secondary | ICD-10-CM

## 2018-08-19 DIAGNOSIS — Z87891 Personal history of nicotine dependence: Secondary | ICD-10-CM | POA: Diagnosis not present

## 2018-08-19 DIAGNOSIS — C787 Secondary malignant neoplasm of liver and intrahepatic bile duct: Secondary | ICD-10-CM | POA: Insufficient documentation

## 2018-08-19 DIAGNOSIS — E041 Nontoxic single thyroid nodule: Secondary | ICD-10-CM

## 2018-08-19 DIAGNOSIS — D490 Neoplasm of unspecified behavior of digestive system: Secondary | ICD-10-CM | POA: Insufficient documentation

## 2018-08-19 DIAGNOSIS — D509 Iron deficiency anemia, unspecified: Secondary | ICD-10-CM

## 2018-08-19 DIAGNOSIS — R1031 Right lower quadrant pain: Secondary | ICD-10-CM | POA: Insufficient documentation

## 2018-08-19 DIAGNOSIS — R42 Dizziness and giddiness: Secondary | ICD-10-CM | POA: Diagnosis not present

## 2018-08-19 LAB — CBC WITH DIFFERENTIAL/PLATELET
Abs Immature Granulocytes: 0.01 10*3/uL (ref 0.00–0.07)
Basophils Absolute: 0 10*3/uL (ref 0.0–0.1)
Basophils Relative: 0 %
EOS PCT: 1 %
Eosinophils Absolute: 0.1 10*3/uL (ref 0.0–0.5)
HCT: 35.5 % — ABNORMAL LOW (ref 36.0–46.0)
Hemoglobin: 10.4 g/dL — ABNORMAL LOW (ref 12.0–15.0)
Immature Granulocytes: 0 %
Lymphocytes Relative: 32 %
Lymphs Abs: 1.9 10*3/uL (ref 0.7–4.0)
MCH: 27.4 pg (ref 26.0–34.0)
MCHC: 29.3 g/dL — AB (ref 30.0–36.0)
MCV: 93.4 fL (ref 80.0–100.0)
MONO ABS: 0.5 10*3/uL (ref 0.1–1.0)
Monocytes Relative: 8 %
Neutro Abs: 3.4 10*3/uL (ref 1.7–7.7)
Neutrophils Relative %: 59 %
Platelets: 333 10*3/uL (ref 150–400)
RBC: 3.8 MIL/uL — ABNORMAL LOW (ref 3.87–5.11)
RDW: 15.8 % — ABNORMAL HIGH (ref 11.5–15.5)
WBC: 5.8 10*3/uL (ref 4.0–10.5)
nRBC: 0 % (ref 0.0–0.2)

## 2018-08-19 LAB — CMP (CANCER CENTER ONLY)
ALT: 7 U/L (ref 0–44)
AST: 11 U/L — ABNORMAL LOW (ref 15–41)
Albumin: 3 g/dL — ABNORMAL LOW (ref 3.5–5.0)
Alkaline Phosphatase: 91 U/L (ref 38–126)
Anion gap: 10 (ref 5–15)
BUN: 9 mg/dL (ref 6–20)
CO2: 25 mmol/L (ref 22–32)
Calcium: 9.1 mg/dL (ref 8.9–10.3)
Chloride: 109 mmol/L (ref 98–111)
Creatinine: 0.88 mg/dL (ref 0.44–1.00)
GFR, Est AFR Am: >60 mL/min (ref >60)
Glucose, Bld: 86 mg/dL (ref 70–99)
Potassium: 4.2 mmol/L (ref 3.5–5.1)
Sodium: 144 mmol/L (ref 135–145)
Total Bilirubin: 0.2 mg/dL — ABNORMAL LOW (ref 0.3–1.2)
Total Protein: 7.5 g/dL (ref 6.5–8.1)

## 2018-08-19 LAB — IRON AND TIBC
IRON: 24 ug/dL — AB (ref 41–142)
Saturation Ratios: 11 % — ABNORMAL LOW (ref 21–57)
TIBC: 219 ug/dL — ABNORMAL LOW (ref 236–444)
UIBC: 195 ug/dL (ref 120–384)

## 2018-08-19 LAB — SAMPLE TO BLOOD BANK

## 2018-08-19 LAB — FERRITIN: Ferritin: 94 ng/mL (ref 11–307)

## 2018-08-19 MED ORDER — ALBUTEROL SULFATE HFA 108 (90 BASE) MCG/ACT IN AERS: INHALATION_SPRAY | RESPIRATORY_TRACT | 1 refills | Status: DC

## 2018-08-19 NOTE — Telephone Encounter (Signed)
Printed avs and calender of upcoming appointment. Per 12/5 los 

## 2018-08-20 ENCOUNTER — Telehealth: Payer: Self-pay | Admitting: *Deleted

## 2018-08-20 ENCOUNTER — Ambulatory Visit
Admission: RE | Admit: 2018-08-20 | Discharge: 2018-08-20 | Disposition: A | Discharge status: Home / Self Care | Payer: Medicaid Other | Source: Ambulatory Visit | Attending: Otolaryngology | Admitting: Otolaryngology

## 2018-08-20 DIAGNOSIS — E041 Nontoxic single thyroid nodule: Secondary | ICD-10-CM

## 2018-08-20 NOTE — Telephone Encounter (Signed)
Patient called. Liver biopsy now scheduled for 12/13 at same time as Iron infusion. Patient requested iron infusion be rescheduled for another day next week.

## 2018-08-24 ENCOUNTER — Encounter: Payer: Self-pay | Admitting: Hematology

## 2018-08-24 ENCOUNTER — Telehealth: Payer: Self-pay | Admitting: Hematology

## 2018-08-24 NOTE — Telephone Encounter (Signed)
Spoke with patient and got her set up for transportation on 12/12 and 12/20.

## 2018-08-26 ENCOUNTER — Other Ambulatory Visit: Payer: Self-pay | Admitting: Radiology

## 2018-08-26 ENCOUNTER — Inpatient Hospital Stay: Payer: Medicaid Other

## 2018-08-26 VITALS — BP 139/85 | HR 74 | Temp 98.5°F | Resp 20

## 2018-08-26 DIAGNOSIS — D509 Iron deficiency anemia, unspecified: Secondary | ICD-10-CM | POA: Diagnosis not present

## 2018-08-26 MED ORDER — SODIUM CHLORIDE 0.9 % IV SOLN
Freq: Once | INTRAVENOUS | Status: AC
  Administered 2018-08-26: 14:00:00 via INTRAVENOUS
  Filled 2018-08-26: qty 250

## 2018-08-26 MED ORDER — SODIUM CHLORIDE 0.9 % IV SOLN
750.0000 mg | Freq: Once | INTRAVENOUS | Status: AC
  Administered 2018-08-26: 750 mg via INTRAVENOUS
  Filled 2018-08-26: qty 15

## 2018-08-26 NOTE — Patient Instructions (Signed)
Iron Sucrose injection (Venofer) What is this medicine? IRON SUCROSE (AHY ern SOO krohs) is an iron complex. Iron is used to make healthy red blood cells, which carry oxygen and nutrients throughout the body. This medicine is used to treat iron deficiency anemia in people with chronic kidney disease. This medicine may be used for other purposes; ask your health care provider or pharmacist if you have questions. COMMON BRAND NAME(S): Venofer What should I tell my health care provider before I take this medicine? They need to know if you have any of these conditions: -anemia not caused by low iron levels -heart disease -high levels of iron in the blood -kidney disease -liver disease -an unusual or allergic reaction to iron, other medicines, foods, dyes, or preservatives -pregnant or trying to get pregnant -breast-feeding How should I use this medicine? This medicine is for infusion into a vein. It is given by a health care professional in a hospital or clinic setting. Talk to your pediatrician regarding the use of this medicine in children. While this drug may be prescribed for children as young as 2 years for selected conditions, precautions do apply. Overdosage: If you think you have taken too much of this medicine contact a poison control center or emergency room at once. NOTE: This medicine is only for you. Do not share this medicine with others. What if I miss a dose? It is important not to miss your dose. Call your doctor or health care professional if you are unable to keep an appointment. What may interact with this medicine? Do not take this medicine with any of the following medications: -deferoxamine -dimercaprol -other iron products This medicine may also interact with the following medications: -chloramphenicol -deferasirox This list may not describe all possible interactions. Give your health care provider a list of all the medicines, herbs, non-prescription drugs, or  dietary supplements you use. Also tell them if you smoke, drink alcohol, or use illegal drugs. Some items may interact with your medicine. What should I watch for while using this medicine? Visit your doctor or healthcare professional regularly. Tell your doctor or healthcare professional if your symptoms do not start to get better or if they get worse. You may need blood work done while you are taking this medicine. You may need to follow a special diet. Talk to your doctor. Foods that contain iron include: whole grains/cereals, dried fruits, beans, or peas, leafy green vegetables, and organ meats (liver, kidney). What side effects may I notice from receiving this medicine? Side effects that you should report to your doctor or health care professional as soon as possible: -allergic reactions like skin rash, itching or hives, swelling of the face, lips, or tongue -breathing problems -changes in blood pressure -cough -fast, irregular heartbeat -feeling faint or lightheaded, falls -fever or chills -flushing, sweating, or hot feelings -joint or muscle aches/pains -seizures -swelling of the ankles or feet -unusually weak or tired Side effects that usually do not require medical attention (report to your doctor or health care professional if they continue or are bothersome): -diarrhea -feeling achy -headache -irritation at site where injected -nausea, vomiting -stomach upset -tiredness This list may not describe all possible side effects. Call your doctor for medical advice about side effects. You may report side effects to FDA at 1-800-FDA-1088. Where should I keep my medicine? This drug is given in a hospital or clinic and will not be stored at home. NOTE: This sheet is a summary. It may not cover all possible information.  If you have questions about this medicine, talk to your doctor, pharmacist, or health care provider.  2018 Elsevier/Gold Standard (2011-06-12 17:14:35)

## 2018-08-27 ENCOUNTER — Inpatient Hospital Stay (HOSPITAL_COMMUNITY)
Admission: RE | Admit: 2018-08-27 | Discharge: 2018-09-03 | DRG: 435 | Disposition: A | Discharge status: Home / Self Care | Payer: Medicaid Other | Attending: Internal Medicine | Admitting: Internal Medicine

## 2018-08-27 ENCOUNTER — Encounter (HOSPITAL_COMMUNITY): Payer: Self-pay

## 2018-08-27 ENCOUNTER — Encounter (HOSPITAL_COMMUNITY): Payer: Medicaid Other

## 2018-08-27 ENCOUNTER — Other Ambulatory Visit: Payer: Self-pay

## 2018-08-27 ENCOUNTER — Ambulatory Visit (HOSPITAL_COMMUNITY): Payer: Medicaid Other

## 2018-08-27 ENCOUNTER — Other Ambulatory Visit: Payer: Self-pay | Admitting: Interventional Radiology

## 2018-08-27 ENCOUNTER — Ambulatory Visit (HOSPITAL_COMMUNITY)
Admission: RE | Admit: 2018-08-27 | Discharge: 2018-08-27 | Disposition: A | Discharge status: Home / Self Care | Payer: Medicaid Other | Source: Ambulatory Visit | Attending: Radiology | Admitting: Radiology

## 2018-08-27 ENCOUNTER — Ambulatory Visit (HOSPITAL_COMMUNITY)
Admission: RE | Admit: 2018-08-27 | Discharge: 2018-08-27 | Disposition: A | Discharge status: Home / Self Care | Payer: Medicaid Other | Source: Ambulatory Visit | Attending: Hematology | Admitting: Hematology

## 2018-08-27 DIAGNOSIS — R42 Dizziness and giddiness: Secondary | ICD-10-CM | POA: Insufficient documentation

## 2018-08-27 DIAGNOSIS — D509 Iron deficiency anemia, unspecified: Secondary | ICD-10-CM | POA: Diagnosis not present

## 2018-08-27 DIAGNOSIS — M1990 Unspecified osteoarthritis, unspecified site: Secondary | ICD-10-CM | POA: Diagnosis present

## 2018-08-27 DIAGNOSIS — R55 Syncope and collapse: Secondary | ICD-10-CM | POA: Diagnosis present

## 2018-08-27 DIAGNOSIS — J690 Pneumonitis due to inhalation of food and vomit: Secondary | ICD-10-CM | POA: Diagnosis present

## 2018-08-27 DIAGNOSIS — C180 Malignant neoplasm of cecum: Secondary | ICD-10-CM | POA: Diagnosis present

## 2018-08-27 DIAGNOSIS — Z87891 Personal history of nicotine dependence: Secondary | ICD-10-CM

## 2018-08-27 DIAGNOSIS — E876 Hypokalemia: Secondary | ICD-10-CM | POA: Diagnosis present

## 2018-08-27 DIAGNOSIS — C787 Secondary malignant neoplasm of liver and intrahepatic bile duct: Secondary | ICD-10-CM

## 2018-08-27 DIAGNOSIS — D519 Vitamin B12 deficiency anemia, unspecified: Secondary | ICD-10-CM | POA: Insufficient documentation

## 2018-08-27 DIAGNOSIS — J45909 Unspecified asthma, uncomplicated: Secondary | ICD-10-CM | POA: Diagnosis present

## 2018-08-27 DIAGNOSIS — E66813 Obesity, class 3: Secondary | ICD-10-CM | POA: Insufficient documentation

## 2018-08-27 DIAGNOSIS — K7689 Other specified diseases of liver: Secondary | ICD-10-CM | POA: Diagnosis not present

## 2018-08-27 DIAGNOSIS — D518 Other vitamin B12 deficiency anemias: Secondary | ICD-10-CM

## 2018-08-27 DIAGNOSIS — R112 Nausea with vomiting, unspecified: Secondary | ICD-10-CM

## 2018-08-27 DIAGNOSIS — Z79899 Other long term (current) drug therapy: Secondary | ICD-10-CM

## 2018-08-27 DIAGNOSIS — R05 Cough: Secondary | ICD-10-CM

## 2018-08-27 DIAGNOSIS — R059 Cough, unspecified: Secondary | ICD-10-CM

## 2018-08-27 DIAGNOSIS — Y848 Other medical procedures as the cause of abnormal reaction of the patient, or of later complication, without mention of misadventure at the time of the procedure: Secondary | ICD-10-CM | POA: Diagnosis not present

## 2018-08-27 DIAGNOSIS — K91840 Postprocedural hemorrhage and hematoma of a digestive system organ or structure following a digestive system procedure: Secondary | ICD-10-CM | POA: Diagnosis not present

## 2018-08-27 DIAGNOSIS — M549 Dorsalgia, unspecified: Secondary | ICD-10-CM | POA: Diagnosis present

## 2018-08-27 DIAGNOSIS — E041 Nontoxic single thyroid nodule: Secondary | ICD-10-CM | POA: Diagnosis present

## 2018-08-27 DIAGNOSIS — D62 Acute posthemorrhagic anemia: Secondary | ICD-10-CM | POA: Diagnosis present

## 2018-08-27 DIAGNOSIS — E538 Deficiency of other specified B group vitamins: Secondary | ICD-10-CM | POA: Diagnosis present

## 2018-08-27 DIAGNOSIS — Z90710 Acquired absence of both cervix and uterus: Secondary | ICD-10-CM

## 2018-08-27 DIAGNOSIS — Z885 Allergy status to narcotic agent status: Secondary | ICD-10-CM

## 2018-08-27 DIAGNOSIS — C189 Malignant neoplasm of colon, unspecified: Secondary | ICD-10-CM

## 2018-08-27 DIAGNOSIS — G4733 Obstructive sleep apnea (adult) (pediatric): Secondary | ICD-10-CM | POA: Diagnosis present

## 2018-08-27 DIAGNOSIS — M62838 Other muscle spasm: Secondary | ICD-10-CM | POA: Diagnosis present

## 2018-08-27 DIAGNOSIS — L309 Dermatitis, unspecified: Secondary | ICD-10-CM | POA: Diagnosis present

## 2018-08-27 DIAGNOSIS — Z91040 Latex allergy status: Secondary | ICD-10-CM

## 2018-08-27 DIAGNOSIS — F419 Anxiety disorder, unspecified: Secondary | ICD-10-CM | POA: Diagnosis present

## 2018-08-27 DIAGNOSIS — I10 Essential (primary) hypertension: Secondary | ICD-10-CM | POA: Diagnosis present

## 2018-08-27 DIAGNOSIS — G8929 Other chronic pain: Secondary | ICD-10-CM | POA: Diagnosis present

## 2018-08-27 DIAGNOSIS — J9601 Acute respiratory failure with hypoxia: Secondary | ICD-10-CM | POA: Diagnosis not present

## 2018-08-27 DIAGNOSIS — Z6841 Body Mass Index (BMI) 40.0 and over, adult: Secondary | ICD-10-CM

## 2018-08-27 DIAGNOSIS — Z9109 Other allergy status, other than to drugs and biological substances: Secondary | ICD-10-CM

## 2018-08-27 HISTORY — DX: Sleep apnea, unspecified: G47.30

## 2018-08-27 HISTORY — DX: Personal history of other medical treatment: Z92.89

## 2018-08-27 HISTORY — DX: Dyspnea, unspecified: R06.00

## 2018-08-27 LAB — HEMOGLOBIN AND HEMATOCRIT, BLOOD
HCT: 28 % — ABNORMAL LOW (ref 36.0–46.0)
Hemoglobin: 8 g/dL — ABNORMAL LOW (ref 12.0–15.0)

## 2018-08-27 LAB — CBC
HCT: 33.1 % — ABNORMAL LOW (ref 36.0–46.0)
Hemoglobin: 9.3 g/dL — ABNORMAL LOW (ref 12.0–15.0)
MCH: 27.2 pg (ref 26.0–34.0)
MCHC: 28.1 g/dL — ABNORMAL LOW (ref 30.0–36.0)
MCV: 96.8 fL (ref 80.0–100.0)
PLATELETS: 274 10*3/uL (ref 150–400)
RBC: 3.42 MIL/uL — AB (ref 3.87–5.11)
RDW: 15.9 % — ABNORMAL HIGH (ref 11.5–15.5)
WBC: 5.4 10*3/uL (ref 4.0–10.5)
nRBC: 0 % (ref 0.0–0.2)

## 2018-08-27 LAB — CBC WITH DIFFERENTIAL/PLATELET
Abs Immature Granulocytes: 0.08 10*3/uL — ABNORMAL HIGH (ref 0.00–0.07)
Basophils Absolute: 0 10*3/uL (ref 0.0–0.1)
Basophils Relative: 0 %
Eosinophils Absolute: 0.1 10*3/uL (ref 0.0–0.5)
Eosinophils Relative: 0 %
HCT: 29.3 % — ABNORMAL LOW (ref 36.0–46.0)
Hemoglobin: 8.4 g/dL — ABNORMAL LOW (ref 12.0–15.0)
Immature Granulocytes: 1 %
Lymphocytes Relative: 15 %
Lymphs Abs: 1.8 10*3/uL (ref 0.7–4.0)
MCH: 27.7 pg (ref 26.0–34.0)
MCHC: 28.7 g/dL — ABNORMAL LOW (ref 30.0–36.0)
MCV: 96.7 fL (ref 80.0–100.0)
Monocytes Absolute: 0.5 10*3/uL (ref 0.1–1.0)
Monocytes Relative: 4 %
Neutro Abs: 9.8 10*3/uL — ABNORMAL HIGH (ref 1.7–7.7)
Neutrophils Relative %: 80 %
PLATELETS: 278 10*3/uL (ref 150–400)
RBC: 3.03 MIL/uL — ABNORMAL LOW (ref 3.87–5.11)
RDW: 15.9 % — ABNORMAL HIGH (ref 11.5–15.5)
WBC: 12.3 10*3/uL — ABNORMAL HIGH (ref 4.0–10.5)
nRBC: 0 % (ref 0.0–0.2)

## 2018-08-27 LAB — COMPREHENSIVE METABOLIC PANEL
ALT: 19 U/L (ref 0–44)
AST: 35 U/L (ref 15–41)
Albumin: 2.6 g/dL — ABNORMAL LOW (ref 3.5–5.0)
Alkaline Phosphatase: 77 U/L (ref 38–126)
Anion gap: 7 (ref 5–15)
BUN: 10 mg/dL (ref 6–20)
CO2: 25 mmol/L (ref 22–32)
Calcium: 7.9 mg/dL — ABNORMAL LOW (ref 8.9–10.3)
Chloride: 111 mmol/L (ref 98–111)
Creatinine, Ser: 0.92 mg/dL (ref 0.44–1.00)
GFR calc Af Amer: >60 mL/min (ref >60)
Glucose, Bld: 131 mg/dL — ABNORMAL HIGH (ref 70–99)
Potassium: 3.4 mmol/L — ABNORMAL LOW (ref 3.5–5.1)
Sodium: 143 mmol/L (ref 135–145)
Total Bilirubin: 0.4 mg/dL (ref 0.3–1.2)
Total Protein: 6 g/dL — ABNORMAL LOW (ref 6.5–8.1)

## 2018-08-27 LAB — PROTIME-INR
INR: 1.01
INR: 1.09
Prothrombin Time: 13.2 seconds (ref 11.4–15.2)
Prothrombin Time: 14 seconds (ref 11.4–15.2)

## 2018-08-27 LAB — APTT: aPTT: 38 seconds — ABNORMAL HIGH (ref 24–36)

## 2018-08-27 LAB — GLUCOSE, CAPILLARY: Glucose-Capillary: 145 mg/dL — ABNORMAL HIGH (ref 70–99)

## 2018-08-27 MED ORDER — ONDANSETRON HCL 4 MG/2ML IJ SOLN
4.0000 mg | Freq: Four times a day (QID) | INTRAMUSCULAR | Status: DC | PRN
  Administered 2018-08-28 – 2018-09-03 (×9): 4 mg via INTRAVENOUS
  Filled 2018-08-27 (×10): qty 2

## 2018-08-27 MED ORDER — ACETAMINOPHEN 650 MG RE SUPP: 650.0000 mg | Freq: Four times a day (QID) | RECTAL | Status: DC | PRN

## 2018-08-27 MED ORDER — MIDAZOLAM HCL 2 MG/2ML IJ SOLN
INTRAMUSCULAR | Status: AC
  Filled 2018-08-27: qty 4

## 2018-08-27 MED ORDER — ENSURE ENLIVE PO LIQD
237.0000 mL | Freq: Two times a day (BID) | ORAL | Status: DC
  Administered 2018-08-30 – 2018-09-01 (×5): 237 mL via ORAL

## 2018-08-27 MED ORDER — TRIAMCINOLONE ACETONIDE 55 MCG/ACT NA AERO: 2.0000 | INHALATION_SPRAY | Freq: Every day | NASAL | Status: DC | PRN

## 2018-08-27 MED ORDER — CYCLOBENZAPRINE HCL 10 MG PO TABS
10.0000 mg | ORAL_TABLET | Freq: Three times a day (TID) | ORAL | Status: DC | PRN
  Administered 2018-08-27 – 2018-08-31 (×3): 10 mg via ORAL
  Filled 2018-08-27 (×4): qty 1

## 2018-08-27 MED ORDER — OLOPATADINE HCL 0.1 % OP SOLN
1.0000 [drp] | Freq: Two times a day (BID) | OPHTHALMIC | Status: DC
  Administered 2018-08-30 – 2018-09-03 (×8): 1 [drp] via OPHTHALMIC
  Filled 2018-08-27: qty 5

## 2018-08-27 MED ORDER — ALBUTEROL SULFATE HFA 108 (90 BASE) MCG/ACT IN AERS: 2.0000 | INHALATION_SPRAY | Freq: Four times a day (QID) | RESPIRATORY_TRACT | Status: DC | PRN

## 2018-08-27 MED ORDER — DICYCLOMINE HCL 20 MG PO TABS
20.0000 mg | ORAL_TABLET | Freq: Three times a day (TID) | ORAL | Status: DC
  Administered 2018-08-28 – 2018-09-03 (×20): 20 mg via ORAL
  Filled 2018-08-27 (×20): qty 1

## 2018-08-27 MED ORDER — METHOCARBAMOL 500 MG PO TABS: 750.0000 mg | ORAL_TABLET | Freq: Three times a day (TID) | ORAL | Status: DC | PRN

## 2018-08-27 MED ORDER — IOPAMIDOL (ISOVUE-370) INJECTION 76%
INTRAVENOUS | Status: AC
  Filled 2018-08-27: qty 100

## 2018-08-27 MED ORDER — ALBUTEROL SULFATE (2.5 MG/3ML) 0.083% IN NEBU
2.5000 mg | INHALATION_SOLUTION | Freq: Four times a day (QID) | RESPIRATORY_TRACT | Status: DC | PRN
  Administered 2018-08-30 – 2018-08-31 (×2): 2.5 mg via RESPIRATORY_TRACT
  Filled 2018-08-27 (×2): qty 3

## 2018-08-27 MED ORDER — DIAZEPAM 2 MG PO TABS
5.0000 mg | ORAL_TABLET | Freq: Every day | ORAL | Status: DC | PRN
  Administered 2018-08-30 – 2018-09-03 (×2): 5 mg via ORAL
  Filled 2018-08-27 (×2): qty 3

## 2018-08-27 MED ORDER — HYDROMORPHONE HCL 1 MG/ML PO LIQD
0.5000 mg | Freq: Four times a day (QID) | ORAL | Status: DC | PRN
  Administered 2018-08-27 – 2018-08-30 (×7): 0.5 mg via ORAL
  Filled 2018-08-27 (×7): qty 1

## 2018-08-27 MED ORDER — DICLOFENAC SODIUM 1 % TD GEL: 2.0000 g | Freq: Four times a day (QID) | TRANSDERMAL | Status: DC | PRN

## 2018-08-27 MED ORDER — ONDANSETRON HCL 4 MG PO TABS
4.0000 mg | ORAL_TABLET | Freq: Four times a day (QID) | ORAL | Status: DC | PRN
  Administered 2018-08-29 (×2): 4 mg via ORAL
  Filled 2018-08-27: qty 1

## 2018-08-27 MED ORDER — FENTANYL CITRATE (PF) 100 MCG/2ML IJ SOLN
INTRAMUSCULAR | Status: AC | PRN
  Administered 2018-08-27 (×2): 50 ug via INTRAVENOUS

## 2018-08-27 MED ORDER — ONDANSETRON HCL 4 MG/2ML IJ SOLN
INTRAMUSCULAR | Status: AC
  Filled 2018-08-27: qty 2

## 2018-08-27 MED ORDER — IOPAMIDOL (ISOVUE-370) INJECTION 76%
100.0000 mL | Freq: Once | INTRAVENOUS | Status: AC | PRN
  Administered 2018-08-27: 100 mL via INTRAVENOUS

## 2018-08-27 MED ORDER — SODIUM CHLORIDE (PF) 0.9 % IJ SOLN
INTRAMUSCULAR | Status: AC
  Filled 2018-08-27: qty 50

## 2018-08-27 MED ORDER — HYDROCODONE-ACETAMINOPHEN 5-325 MG PO TABS
1.0000 | ORAL_TABLET | ORAL | Status: DC | PRN
  Administered 2018-08-27: 1 via ORAL
  Filled 2018-08-27 (×2): qty 1

## 2018-08-27 MED ORDER — ACETAMINOPHEN 325 MG PO TABS
650.0000 mg | ORAL_TABLET | Freq: Four times a day (QID) | ORAL | Status: DC | PRN
  Administered 2018-08-30 – 2018-09-02 (×4): 650 mg via ORAL
  Filled 2018-08-27 (×4): qty 2

## 2018-08-27 MED ORDER — FENTANYL CITRATE (PF) 100 MCG/2ML IJ SOLN
INTRAMUSCULAR | Status: AC
  Filled 2018-08-27: qty 2

## 2018-08-27 MED ORDER — MIDAZOLAM HCL 2 MG/2ML IJ SOLN
INTRAMUSCULAR | Status: AC | PRN
  Administered 2018-08-27 (×2): 1 mg via INTRAVENOUS

## 2018-08-27 MED ORDER — TRAMADOL HCL 50 MG PO TABS
50.0000 mg | ORAL_TABLET | Freq: Four times a day (QID) | ORAL | Status: DC | PRN
  Administered 2018-08-27 – 2018-09-02 (×7): 50 mg via ORAL
  Filled 2018-08-27 (×7): qty 1

## 2018-08-27 MED ORDER — ONDANSETRON HCL 4 MG/2ML IJ SOLN
4.0000 mg | Freq: Once | INTRAMUSCULAR | Status: AC
  Administered 2018-08-27: 4 mg via INTRAVENOUS

## 2018-08-27 MED ORDER — SODIUM CHLORIDE 0.9 % IV SOLN
INTRAVENOUS | Status: DC
  Administered 2018-08-27 (×3): via INTRAVENOUS

## 2018-08-27 MED ORDER — HYDROMORPHONE HCL 1 MG/ML IJ SOLN
1.0000 mg | Freq: Once | INTRAMUSCULAR | Status: AC
  Administered 2018-08-27: 1 mg via INTRAVENOUS
  Filled 2018-08-27: qty 1

## 2018-08-27 MED ORDER — SODIUM CHLORIDE 0.9 % IV SOLN
INTRAVENOUS | Status: DC
  Administered 2018-08-28: 05:00:00 via INTRAVENOUS

## 2018-08-27 MED ORDER — LORATADINE 10 MG PO TABS
10.0000 mg | ORAL_TABLET | Freq: Every day | ORAL | Status: DC
  Administered 2018-08-30 – 2018-09-03 (×5): 10 mg via ORAL
  Filled 2018-08-27 (×6): qty 1

## 2018-08-27 MED ORDER — VITAMIN B-12 1000 MCG PO TABS
1000.0000 ug | ORAL_TABLET | Freq: Every day | ORAL | Status: DC
  Administered 2018-08-29 – 2018-09-03 (×6): 1000 ug via ORAL
  Filled 2018-08-27 (×7): qty 1

## 2018-08-27 MED ORDER — ZOLPIDEM TARTRATE 5 MG PO TABS
5.0000 mg | ORAL_TABLET | Freq: Every evening | ORAL | Status: DC | PRN
  Administered 2018-08-30 – 2018-09-01 (×3): 5 mg via ORAL
  Filled 2018-08-27 (×2): qty 1

## 2018-08-27 MED ORDER — MOMETASONE FURO-FORMOTEROL FUM 200-5 MCG/ACT IN AERO
2.0000 | INHALATION_SPRAY | Freq: Two times a day (BID) | RESPIRATORY_TRACT | Status: DC
  Administered 2018-08-27 – 2018-09-03 (×10): 2 via RESPIRATORY_TRACT
  Filled 2018-08-27: qty 8.8

## 2018-08-27 MED ORDER — B-12 1000 MCG SL SUBL: 1000.0000 ug | SUBLINGUAL_TABLET | Freq: Every day | SUBLINGUAL | Status: DC

## 2018-08-27 MED ORDER — PANTOPRAZOLE SODIUM 40 MG PO TBEC
40.0000 mg | DELAYED_RELEASE_TABLET | Freq: Every day | ORAL | Status: DC
  Administered 2018-08-28 – 2018-09-03 (×7): 40 mg via ORAL
  Filled 2018-08-27 (×7): qty 1

## 2018-08-27 MED ORDER — LIDOCAINE HCL (PF) 1 % IJ SOLN
INTRAMUSCULAR | Status: AC | PRN
  Administered 2018-08-27: 10 mL

## 2018-08-27 NOTE — H&P (Addendum)
Referring Physician(s): Brunetta Genera  Supervising Physician: Jacqulynn Cadet  Patient Status:  Margaret [name]  Chief Complaint: "I'm here for another liver biopsy"  Subjective: Patient familiar to IR service from prior liver lesion biopsy on 07/22/2018.  She has a history of iron deficiency anemia, B12 deficiency, intermittent right upper quadrant abdominal discomfort and recent imaging revealing hypermetabolic cecal mass, multiple liver lesions, thyroid nodules.  Pathology from recent liver biopsy revealed benign liver with gastric mucosa and smooth muscle.  She presents again today for repeat image guided liver lesion biopsy for further evaluation.  Currently denies fever, headache, chest pain, dyspnea, cough, back pain, nausea, vomiting or bleeding.  She does have some intermittent right upper quadrant abdominal discomfort.  Past Medical History:  Diagnosis Date  . Allergic rhinitis   . Anxiety diagnosed in 1990  . Arthritis   . Asthma   . Dyspnea    with low blood count hx of anemia  . Eczema   . History of blood transfusion   . Hypertension   . Lower back pain   . Migraine   . Sleep apnea    uses CPAP   Past Surgical History:  Procedure Laterality Date  . ABDOMINAL HYSTERECTOMY    . cortisone injections     knees bilat and back   . iron infusion    . LIVER BIOPSY        Allergies: Latex; Tape; and Tylenol with codeine #3 [acetaminophen-codeine]  Medications: Prior to Admission medications   Medication Sig Start Date End Date Taking? Authorizing Provider  albuterol (PROAIR HFA) 108 (90 Base) MCG/ACT inhaler INHALE TWO puffs BY MOUTH into the lungs EVERY 4 HOURS AS NEEDED FOR WHEEZING OR shortness of breath 08/19/18   Valentina Shaggy, MD  Boric Acid 4 % SOLN Place 1 capsule vaginally every Friday.     [provider]  cetirizine (ZYRTEC) 10 MG tablet Take 1 tablet (10 mg total) by mouth daily. 07/06/18   Valentina Shaggy, MD  clobetasol  ointment (TEMOVATE) 4.68 % Apply 1 application topically 2 (two) times daily. Use for Lichen Sclerosis    Bovard-Stuckert, Jody, MD  Cyanocobalamin (B-12) 1000 MCG SUBL Place 1,000 mcg under the tongue daily. 04/28/18   Brunetta Genera, MD  cyclobenzaprine (FLEXERIL) 10 MG tablet Take 10 mg by mouth 3 (three) times daily as needed (muscle spasms.).     [provider]  diazepam (VALIUM) 5 MG tablet Take 5 mg by mouth daily as needed for anxiety (panic attacks).     Nolene Ebbs, MD  diclofenac sodium (VOLTAREN) 1 % GEL Apply 2 g topically 4 (four) times daily as needed (for back/knee pain.).     [provider]  dicyclomine (BENTYL) 20 MG tablet Take 1 tablet (20 mg total) by mouth 2 (two) times daily. Patient taking differently: Take 20 mg by mouth 3 (three) times daily before meals.  04/16/18   Doristine Devoid, PA-C  ergocalciferol (VITAMIN D2) 50000 UNITS capsule Take 50,000 Units by mouth every Monday.     [provider]  lisinopril-hydrochlorothiazide (PRINZIDE,ZESTORETIC) 20-12.5 MG tablet Take 1 tablet by mouth daily.    [provider]  methocarbamol (ROBAXIN) 750 MG tablet Take 750 mg by mouth every 8 (eight) hours as needed (back spasms.).     [provider]  montelukast (SINGULAIR) 10 MG tablet Take 1 tablet (10 mg total) by mouth at bedtime. 07/06/18   Valentina Shaggy, MD  nystatin (NYSTATIN) powder  Apply 1 g topically 2 (two) times daily as needed (irritation). Use in skin folds and under breast     Bovard-Stuckert, Jody, MD  nystatin-triamcinolone (MYCOLOG II) cream Apply 1 application topically 2 (two) times daily as needed (irritation). Use in skin folds and under breast     Bovard-Stuckert, Jody, MD  pantoprazole (PROTONIX) 40 MG tablet Take 40 mg by mouth daily before breakfast.  06/24/18   [provider]  PATADAY 0.2 % SOLN Apply 2 drops to eye 2 (two) times daily as needed (for irritated eyes.). 07/30/18    Valentina Shaggy, MD  pimecrolimus (ELIDEL) 1 % cream Apply 1 application topically 2 (two) times daily. 07/06/18   Valentina Shaggy, MD  SYMBICORT 160-4.5 MCG/ACT inhaler Inhale 2 puffs into the lungs 2 (two) times daily. Patient taking differently: Inhale 2 puffs into the lungs 2 (two) times daily.  08/05/18   Valentina Shaggy, MD  traMADol (ULTRAM) 50 MG tablet Take 50 mg by mouth every 6 (six) hours as needed for moderate pain (back pain.).     Nolene Ebbs, MD  triamcinolone (NASACORT) 55 MCG/ACT AERO nasal inhaler Place 2 sprays into the nose daily as needed (for allergies). 07/06/18   Valentina Shaggy, MD  valACYclovir (VALTREX) 500 MG tablet Take 500 mg by mouth 2 (two) times daily as needed (for cold sores).     [provider]  zolpidem (AMBIEN) 5 MG tablet Take 5 mg by mouth at bedtime as needed for sleep.    [provider]     Vital Signs: BP (!) 157/94   Pulse (!) 52   Temp 98.3 F (36.8 C) (Oral)   Resp 18   SpO2 100%   Physical Exam awake, alert.  Chest clear to auscultation bilaterally anteriorly.  Heart with regular rate and rhythm.  Abdomen soft, obese, positive bowel sounds, mildly tender right upper quadrant to palpation.  Extremities with full range of motion.  Imaging: No results found.  Labs:  CBC: Recent Labs    07/06/18 1232 07/22/18 1103 08/19/18 1404 08/27/18 0951  WBC 12.5* 5.1 5.8 5.4  HGB 10.1* 9.5* 10.4* 9.3*  HCT 36.9 33.6* 35.5* 33.1*  PLT 448* 222 333 274    COAGS: Recent Labs    07/22/18 1139  INR 1.06  APTT 39*    BMP: Recent Labs    04/28/18 1224 06/09/18 1319 07/06/18 1251 08/19/18 1404  NA 145 145 139 144  K 4.0 3.8 3.9 4.2  CL 110 108 108 109  CO2 24 29 24 25   GLUCOSE 86 98 85 86  BUN 10 11 11 9   CALCIUM 9.0 8.8* 8.8* 9.1  CREATININE 0.85 0.79 0.73 0.88  GFRNONAA >60 >60 >60 >60  GFRAA >60 >60 >60 >60    LIVER FUNCTION TESTS: Recent Labs    04/16/18 0032 04/28/18 1224  06/09/18 1319 08/19/18 1404  BILITOT 0.7 0.3 <0.2* 0.2*  AST 13* 11* 11* 11*  ALT 11 11 10 7   ALKPHOS 65 78 78 91  PROT 7.5 7.3 7.1 7.5  ALBUMIN 3.1* 3.0* 2.8* 3.0*    Assessment and Plan: Pt with history of iron deficiency anemia, B12 deficiency, intermittent right upper quadrant abdominal discomfort and recent imaging revealing hypermetabolic cecal mass, multiple liver lesions, thyroid nodules.  Colonoscopy pending. Pathology from recent left lobe liver biopsy revealed benign liver with gastric mucosa and smooth muscle.  She presents again today for repeat image guided liver lesion biopsy for further evaluation.Risks and  benefits discussed with the patient/daughter including, but not limited to bleeding, infection, damage to adjacent structures or low yield requiring additional tests.  All of the patient's questions were answered, patient is agreeable to proceed. Consent signed and in chart.     Electronically Signed: D. Rowe Robert, PA-C 08/27/2018, 10:22 AM   I spent a total of 20 minutes at the the patient's bedside AND on the patient's hospital floor or unit, greater than 50% of which was counseling/coordinating care for image guided liver lesion biopsy

## 2018-08-27 NOTE — H&P (Addendum)
TRH H&P   Patient Demographics:    Margaret [name], is a 47 y.o. female  MRN: [id number]   DOB - 1971-06-20  Admit Date - 08/27/2018  Outpatient Primary MD for the patient is Nolene Ebbs, MD  Referring MD: Stacie Glaze ( IR)  Outpatient Specialists: Dr Irene Limbo (oncology)  Patient coming from: Home/Short stay  Chief complaint Dizziness, subcapsular liver hematoma post procedure     HPI:    Margaret [name]  is a 47 y.o. female, with morbid obesity, asthma, iron  and B12 deficiency, who was evaluated by oncology in August this year for right-sided abdominal pain and symptomatic anemia.  She underwent CT of the abdomen and pelvis showing liver and pancreatic cyst with a 19 mm lesion in the left lobe of liver and recommended for outpatient MRI.  A hepatic protocol MRI was done showing 11 mm pancreatic nodule arising from the tail and multiple hypervascular liver masses suspicious for liver metastasis. Patient underwent PET-CT scan showing hypermetabolic mass involving the cecum concerning for colonic primary neoplasm.  This was followed by liver biopsy 1 month back which showed benign hepatic tissue.  Tumor markers were normal. Patient scheduled for repeat liver biopsy today by IR.  Biopsy was done successfully however following procedure patient complained of right upper quadrant pain and when she got up to ambulate she was dizzy and felt like she was going to pass out.  A follow-up CT of the abdomen postprocedure showed acute hemorrhage with a thin lateral subcapsular hemorrhage adjacent to the right lobe of liver and a larger infrahepatic hematoma (subcapsular) but with inferior extension along the right pericolic gutter into the pelvis.  Reported overall amount of hemorrhage to be moderate all 400-450 mL. Patient's vitals were stable.  Started on IV fluids and hospitalist  consulted for observation overnight.  Patient lying comfortably during my evaluation.  Right upper quadrant pain is 3/10 after receiving a dose of IV Dilaudid 1 mg at the short stay.  Denies any nausea or vomiting, headache, dizziness at present, chest pain, shortness of breath, palpitations, dysuria or diarrhea.  No recent illness or sick contact.  Has chronic back pain.     Review of systems:    In addition to the HPI above,  No Fever-chills, No Headache, No changes with Vision or hearing, No problems swallowing food or Liquids, No Chest pain, Cough or Shortness of Breath, Right upper quadrant abdominal pain, low back pain, no Nausea or Vommitting, Bowel movements are regular, No Blood in stool or Urine, No dysuria, No new skin rashes or bruises, No new joints pains-aches,  No new weakness, tingling, numbness in any extremity, No recent weight gain or loss, No polyuria, polydypsia or polyphagia, No significant Mental Stressors.    With Past History of the following :    Past Medical History:  Diagnosis Date  . Allergic rhinitis   .  Anxiety diagnosed in 1990  . Arthritis   . Asthma   . Dyspnea    with low blood count hx of anemia  . Eczema   . History of blood transfusion   . Hypertension   . Lower back pain   . Migraine   . Sleep apnea    uses CPAP      Past Surgical History:  Procedure Laterality Date  . ABDOMINAL HYSTERECTOMY    . cortisone injections     knees bilat and back   . iron infusion    . LIVER BIOPSY        Social History:     Social History   Tobacco Use  . Smoking status: Former Smoker    Last attempt to quit: 07/30/1999    Years since quitting: 19.0  . Smokeless tobacco: Never Used  Substance Use Topics  . Alcohol use: No     Lives -home  Mobility -independent     Family History :   No family history of heart disease, diabetes or stroke.   Home Medications:   Prior to Admission medications   Medication Sig Start Date  End Date Taking? Authorizing Provider  diazepam (VALIUM) 5 MG tablet Take 5 mg by mouth daily as needed for anxiety (panic attacks).    Yes Nolene Ebbs, MD  diclofenac sodium (VOLTAREN) 1 % GEL Apply 2 g topically 4 (four) times daily as needed (for back/knee pain.).    Yes [provider]  dicyclomine (BENTYL) 20 MG tablet Take 1 tablet (20 mg total) by mouth 2 (two) times daily. Patient taking differently: Take 20 mg by mouth 3 (three) times daily before meals.  04/16/18  Yes Ocie Cornfield T, PA-C  ergocalciferol (VITAMIN D2) 50000 UNITS capsule Take 50,000 Units by mouth every Monday.    Yes [provider]  lisinopril-hydrochlorothiazide (PRINZIDE,ZESTORETIC) 20-12.5 MG tablet Take 1 tablet by mouth daily.   Yes [provider]  montelukast (SINGULAIR) 10 MG tablet Take 1 tablet (10 mg total) by mouth at bedtime. 07/06/18  Yes Valentina Shaggy, MD  nystatin (NYSTATIN) powder Apply 1 g topically 2 (two) times daily as needed (irritation). Use in skin folds and under breast    Yes Bovard-Stuckert, Jody, MD  nystatin-triamcinolone (MYCOLOG II) cream Apply 1 application topically 2 (two) times daily as needed (irritation). Use in skin folds and under breast    Yes Bovard-Stuckert, Jody, MD  pantoprazole (PROTONIX) 40 MG tablet Take 40 mg by mouth daily before breakfast.  06/24/18  Yes [provider]  pimecrolimus (ELIDEL) 1 % cream Apply 1 application topically 2 (two) times daily. 07/06/18  Yes Valentina Shaggy, MD  SYMBICORT 160-4.5 MCG/ACT inhaler Inhale 2 puffs into the lungs 2 (two) times daily. Patient taking differently: Inhale 2 puffs into the lungs 2 (two) times daily.  08/05/18  Yes Valentina Shaggy, MD  traMADol (ULTRAM) 50 MG tablet Take 50 mg by mouth every 6 (six) hours as needed for moderate pain (back pain.).    Yes Nolene Ebbs, MD  zolpidem (AMBIEN) 5 MG tablet Take 5 mg by mouth at bedtime as needed for sleep.   Yes  [provider]  albuterol (PROAIR HFA) 108 (90 Base) MCG/ACT inhaler INHALE TWO puffs BY MOUTH into the lungs EVERY 4 HOURS AS NEEDED FOR WHEEZING OR shortness of breath 08/19/18   Valentina Shaggy, MD  Boric Acid 4 % SOLN Place 1 capsule vaginally every Friday.     [provider]  cetirizine (ZYRTEC) 10 MG tablet Take 1 tablet (10 mg total) by mouth daily. 07/06/18   Valentina Shaggy, MD  clobetasol ointment (TEMOVATE) 1.02 % Apply 1 application topically 2 (two) times daily. Use for Lichen Sclerosis    Bovard-Stuckert, Jody, MD  Cyanocobalamin (B-12) 1000 MCG SUBL Place 1,000 mcg under the tongue daily. 04/28/18   Brunetta Genera, MD  cyclobenzaprine (FLEXERIL) 10 MG tablet Take 10 mg by mouth 3 (three) times daily as needed (muscle spasms.).     [provider]  methocarbamol (ROBAXIN) 750 MG tablet Take 750 mg by mouth every 8 (eight) hours as needed (back spasms.).     [provider]  PATADAY 0.2 % SOLN Apply 2 drops to eye 2 (two) times daily as needed (for irritated eyes.). 07/30/18   Valentina Shaggy, MD  triamcinolone (NASACORT) 55 MCG/ACT AERO nasal inhaler Place 2 sprays into the nose daily as needed (for allergies). 07/06/18   Valentina Shaggy, MD  valACYclovir (VALTREX) 500 MG tablet Take 500 mg by mouth 2 (two) times daily as needed (for cold sores).     [provider]     Allergies:     Allergies  Allergen Reactions  . Latex Hives and Shortness Of Breath  . Tape   . Tylenol With Codeine #3 [Acetaminophen-Codeine] Itching and Rash     Physical Exam:   Vitals Temperature 98.5 F, pulse rate 79/min regular, respiratory rate 16/min, blood pressure 94/68 mmHg, O2 sat 100% on room air  Middle-aged morbidly obese female fatigued, not in distress HEENT: Pallor present, moist mucosa, supple neck no cervical lymphadenopathy Chest: Clear bilaterally, no added sound CVS: Normal S1-S2, no murmurs rub or  gallop GI: Dressing over right upper quadrant area with no bleeding noted, bowel sounds present, tender over right upper quadrant, nondistended Musculoskeletal: Warm, no edema CNs: Alert and oriented    Data Review:    CBC Recent Labs  Lab 08/27/18 0951 08/27/18 1557  WBC 5.4 12.3*  HGB 9.3* 8.4*  HCT 33.1* 29.3*  PLT 274 278  MCV 96.8 96.7  MCH 27.2 27.7  MCHC 28.1* 28.7*  RDW 15.9* 15.9*  LYMPHSABS  --  1.8  MONOABS  --  0.5  EOSABS  --  0.1  BASOSABS  --  0.0   ------------------------------------------------------------------------------------------------------------------  Chemistries  Recent Labs  Lab 08/27/18 1557  NA 143  K 3.4*  CL 111  CO2 25  GLUCOSE 131*  BUN 10  CREATININE 0.92  CALCIUM 7.9*  AST 35  ALT 19  ALKPHOS 77  BILITOT 0.4   ------------------------------------------------------------------------------------------------------------------ estimated creatinine clearance is 118.5 mL/min (by C-G formula based on SCr of 0.92 mg/dL). ------------------------------------------------------------------------------------------------------------------ No results for input(s): TSH, T4TOTAL, T3FREE, THYROIDAB in the last 72 hours.  Invalid input(s): FREET3  Coagulation profile Recent Labs  Lab 08/27/18 0951 08/27/18 1557  INR 1.01 1.09   ------------------------------------------------------------------------------------------------------------------- No results for input(s): DDIMER in the last 72 hours. -------------------------------------------------------------------------------------------------------------------  Cardiac Enzymes No results for input(s): CKMB, TROPONINI, MYOGLOBIN in the last 168 hours.  Invalid input(s): CK ------------------------------------------------------------------------------------------------------------------ No results found for:  BNP   ---------------------------------------------------------------------------------------------------------------  Urinalysis    Component Value Date/Time   COLORURINE YELLOW 07/06/2018 [place] 07/06/2018 1229   LABSPEC 1.019 07/06/2018 1229   PHURINE 7.0 07/06/2018 1229   GLUCOSEU NEGATIVE 07/06/2018 1229   HGBUR MODERATE (A) 07/06/2018 1229   BILIRUBINUR NEGATIVE 07/06/2018 1229   KETONESUR NEGATIVE 07/06/2018 1229   PROTEINUR  NEGATIVE 07/06/2018 1229   UROBILINOGEN 0.2 01/06/2011 1755   NITRITE NEGATIVE 07/06/2018 [place] 07/06/2018 1229    ----------------------------------------------------------------------------------------------------------------   Imaging Results:    Ct Abdomen Wo Contrast  Result Date: 08/27/2018 CLINICAL DATA:  Status post CT-guided biopsy of lesion in segment VII of the right lobe of the liver Faythe Dingwall today under CT guidance. Significant abdominal pain during recovery. EXAM: CT ABDOMEN WITHOUT CONTRAST TECHNIQUE: Multidetector CT imaging of the abdomen was performed following the standard protocol without IV contrast. COMPARISON:  Imaging during liver biopsy earlier today as well as prior CT of the abdomen on 04/16/2018. FINDINGS: Lower chest: Stable scarring in the right lower lung adjacent to the major fissure. Hepatobiliary: Acute subcapsular and perihepatic hemorrhage is present after biopsy, primarily inferior to the right lobe of the liver. Lateral subcapsular component of hemorrhage adjacent to the right lobe measures approximately 1.4-1.5 cm in greatest thickness. Most of the acute hemorrhage is inferior to the right lobe where predominately subcapsular component measures approximately 7 x 10 x 12 cm. There is some extension of blood inferiorly along the right pericolic gutter and into the pelvis. Mixed density is present in the hematoma just posterior to the right lobe inferiorly. Presence of active bleeding  can not be determined without administration of IV contrast. No evidence of biliary ductal dilatation. At least 1 gallstone is present in the gallbladder. Pancreas: Unremarkable. No pancreatic ductal dilatation or surrounding inflammatory changes. Spleen: Normal in size without focal abnormality. Adrenals/Urinary Tract: Adrenal glands are unremarkable. Kidneys are normal, without renal calculi, focal lesion, or hydronephrosis. Stomach/Bowel: Visualized bowel is unremarkable. No ileus, obstruction or inflammation. No free air. Vascular/Lymphatic: No enlarged lymph nodes identified. Other: There is a moderate hiatal hernia. Musculoskeletal: No acute or significant osseous findings. IMPRESSION: Acute hemorrhage after liver biopsy consisting of a thin lateral subcapsular hemorrhage adjacent to the right lobe of the liver and a larger infrahepatic hematoma which is largely subcapsular but with some inferior extension along the right pericolic gutter into the pelvis. Overall amount of hemorrhage is moderate, likely in the 400-450 mL range based on volume estimate. These results were called by telephone at the time of interpretation on 08/27/2018 at 4:15 pm to [facility], PA-C, who verbally acknowledged these results. Electronically Signed   By: Aletta Edouard M.D.   On: 08/27/2018 16:21   Ct Biopsy  Result Date: 08/27/2018 INDICATION: 47 year old female with multifocal hypermetabolic liver lesions and a probable colonic mass concerning for colon cancer metastatic to the liver. Prior ultrasound-guided biopsy to attempt was nondiagnostic. Patient presents for attempted CT-guided biopsy. EXAM: CT BIOPSY MEDICATIONS: None. ANESTHESIA/SEDATION: Moderate (conscious) sedation was employed during this procedure. A total of Versed 2 mg and Fentanyl 100 mcg was administered intravenously. Moderate Sedation Time: 13 minutes. The patient's level of consciousness and vital signs were monitored continuously by radiology nursing  throughout the procedure under my direct supervision. FLUOROSCOPY TIME:  None COMPLICATIONS: None immediate. PROCEDURE: Informed written consent was obtained from the patient after a thorough discussion of the procedural risks, benefits and alternatives. All questions were addressed. A timeout was performed prior to the initiation of the procedure. A planning axial CT scan was performed. Hypoattenuating liver lesion in hepatic segment 7 was successfully identified. A suitable skin entry site was selected and marked. The region was sterilely prepped and draped in standard fashion using chlorhexidine skin prep. Local anesthesia was attained by infiltration with 1% lidocaine. A small dermatotomy was made. Under intermittent CT  guidance, a 20 cm 17 gauge introducer needle was carefully advanced through the liver and positioned at the margin of the hypoechoic mass. Multiple 18 gauge core biopsies were then coaxially obtained using the bio Pince automated biopsy device. Biopsy specimens were placed in formalin and delivered to pathology for further analysis. As the introducer needle was removed, the biopsy tract was embolized with a Gel-Foam slurry. The patient tolerated the procedure well. IMPRESSION: Technically successful CT-guided core biopsy of hepatic lesion. Electronically Signed   By: Jacqulynn Cadet M.D.   On: 08/27/2018 12:41    My personal review of EKG: Pending  Assessment & Plan:   Principal problem Subcapsular liver hematoma post liver biopsy Placed on observation on MedSurg.  Vitals currently stable.  Type and screen ordered.  Monitor H&H every 6-8 hours.  Follow-up hemoglobin shows drop by about 1 g from baseline (8.4 g/dL).  Pain control with home dose tramadol and low-dose Dilaudid as needed. IR will follow up in the morning.  Dr. Laurence Ferrari has ordered CT angiogram of the liver to evaluate the vasculature.  No intervention needed at this time.  If H&H stable could possibly be discharged home  tomorrow.  Dizziness with near syncope Secondary to acute blood loss with orthostasis.  Can be monitored in [place].  Continue IV fluids.  Check orthostatic vitals and EKG.  Hold home blood pressure medications.  Metastatic liver and pancreatic lesion Cecal lesion on PET scan.  Per oncologist suspicious for primary colonic malignancy.  Also has thyroid nodule (possible plan for FNA by her oncologist as outpatient)  History of asthma Stable.  Continue home inhaler  Chronic back pain and muscle spasms Resume home medications  Hypokalemia Replenished  Remaining medical issues are stable.  DVT Prophylaxis: SCDs  AM Labs Ordered, also please review Full Orders  Family Communication: Admission, patients condition and plan of care including tests being ordered have been discussed with the patient and her daughter at bedside  Code Status full code  Likely DC to home in a.m. if H&H stable  Condition: Fair  Consults called: None  Admission status: Observation  Time spent in minutes : 45   Tobiah Celestine M.D on 08/27/2018 at 5:00 PM  Between 7am to 7pm - Pager - [phone]. After 7pm go to [email or web address] - password [facility]  [facility] - Office  [phone]

## 2018-08-27 NOTE — Discharge Instructions (Signed)
Moderate Conscious Sedation, Adult, Care After These instructions provide you with information about caring for yourself after your procedure. Your health care provider may also give you more specific instructions. Your treatment has been planned according to current medical practices, but problems sometimes occur. Call your health care provider if you have any problems or questions after your procedure. What can I expect after the procedure? After your procedure, it is common:  To feel sleepy for several hours.  To feel clumsy and have poor balance for several hours.  To have poor judgment for several hours.  To vomit if you eat too soon.  Follow these instructions at home: For at least 24 hours after the procedure:   Do not: ? Participate in activities where you could fall or become injured. ? Drive. ? Use heavy machinery. ? Drink alcohol. ? Take sleeping pills or medicines that cause drowsiness. ? Make important decisions or sign legal documents. ? Take care of children on your own.  Rest. Eating and drinking  Follow the diet recommended by your health care provider.  If you vomit: ? Drink water, juice, or soup when you can drink without vomiting. ? Make sure you have little or no nausea before eating solid foods. General instructions  Have a responsible adult stay with you until you are awake and alert.  Take over-the-counter and prescription medicines only as told by your health care provider.  If you smoke, do not smoke without supervision.  Keep all follow-up visits as told by your health care provider. This is important. Contact a health care provider if:  You keep feeling nauseous or you keep vomiting.  You feel light-headed.  You develop a rash.  You have a fever. Get help right away if:  You have trouble breathing. This information is not intended to replace advice given to you by your health care provider. Make sure you discuss any questions you have  with your health care provider. Document Released: 06/22/2013 Document Revised: 02/04/2016 Document Reviewed: 12/22/2015 Elsevier Interactive Patient Education  2018 [employer].  Liver Biopsy, Care After Refer to this sheet in the next few weeks. These instructions provide you with information on caring for yourself after your procedure. Your health care provider may also give you more specific instructions. Your treatment has been planned according to current medical practices, but problems sometimes occur. Call your health care provider if you have any problems or questions after your procedure. What can I expect after the procedure? After your procedure, it is typical to have the following:  A small amount of discomfort in the area where the biopsy was done and in the right shoulder or shoulder blade.  A small amount of bruising around the area where the biopsy was done and on the skin over the liver.  Sleepiness and fatigue for the rest of the day.  Follow these instructions at home:  Rest at home for 1-2 days or as directed by your health care provider.  Have a friend or family member stay with you for at least 24 hours.  Because of the medicines used during the procedure, you should not do the following things in the first 24 hours: ? Drive. ? Use machinery. ? Be responsible for the care of other people. ? Sign legal documents. ? Take a bath or shower.  There are many different ways to close and cover an incision, including stitches, skin glue, and adhesive strips. Follow your health care provider's instructions on: ? Incision care. ?  Bandage (dressing) changes and removal. May remove dressing and shower or bathe in 24 hours.  Keep site clean and dry and replace bandaid as necessary.  Do not drink alcohol in the first week.  Do not lift more than 5 pounds or play contact sports for 2 weeks after this test.  Take medicines only as directed by your health care provider. Do  not take medicine containing aspirin or non-steroidal anti-inflammatory medicines such as ibuprofen for 1 week after this test.  It is your responsibility to get your test results. Contact a health care provider if:  You have increased bleeding from an incision that results in more than a small spot of blood.  You have redness, swelling, or increasing pain in any incisions.  You notice a discharge or a bad smell coming from any of your incisions.  You have a fever or chills. Get help right away if:  You develop swelling, bloating, or pain in your abdomen.  You become dizzy or faint.  You develop a rash.  You are nauseous or vomit.  You have difficulty breathing, feel short of breath, or feel faint.  You develop chest pain.  You have problems with your speech or vision.  You have trouble balancing or moving your arms or legs. This information is not intended to replace advice given to you by your health care provider. Make sure you discuss any questions you have with your health care provider. Document Released: 03/21/2005 Document Revised: 02/07/2016 Document Reviewed: 10/28/2013 Elsevier Interactive Patient Education  [employer].

## 2018-08-27 NOTE — Progress Notes (Cosign Needed)
Patient ID: Margaret [name], female   DOB: 06/11/1971, 47 y.o.   MRN: [id number] Pt s/p seg 7 liver lesion bx earlier today; bx tract was gel foamed afterwards;now with some increasing right sided abd pain with  rad to back, nausea, lightheadedness; BP 106/74  HR 81, O2 SATS 100% ,temp 97.7, CBG 145. Has received small dose IV dilaudid and zofran; pt sl drowsy; abd soft, puncture site clean and dry, some mild tenderness RUQ/RLQ to palpation; case d/w Dr. Laurence Ferrari- plan STAT CT abd to assess for any bleed post bx.           Crissie Sickles Allred 08/27/2018, 3:25 PM

## 2018-08-27 NOTE — Progress Notes (Signed)
Patient ID: Margaret [name], female   DOB: March 06, 1971, 47 y.o.   MRN: [id number] Patient's follow-up CT abdomen today post liver biopsy revealed acute hemorrhage consisting of thin lateral subcapsular hemorrhage adjacent to the right lobe of the liver and a larger infrahepatic hematoma with some extension along the right pericolic gutter into the pelvis.  Overall amount moderate.  Dr. Laurence Ferrari aware.  Will obtain stat labs, continue IV hydration and contact [facility] for admission.  Patient more alert now.  States abdominal pain not as bad as initially and currently without nausea.  BP 108/ 64, heart rate 80, O2 sat 98%.  Follow-up hemoglobin 8.4(9.3), WBC 12.3(5.4).

## 2018-08-28 DIAGNOSIS — K7689 Other specified diseases of liver: Secondary | ICD-10-CM | POA: Diagnosis not present

## 2018-08-28 DIAGNOSIS — D62 Acute posthemorrhagic anemia: Secondary | ICD-10-CM | POA: Diagnosis not present

## 2018-08-28 LAB — CBC WITH DIFFERENTIAL/PLATELET
Abs Immature Granulocytes: 0.04 10*3/uL (ref 0.00–0.07)
BASOS PCT: 0 %
Basophils Absolute: 0 10*3/uL (ref 0.0–0.1)
EOS ABS: 0.1 10*3/uL (ref 0.0–0.5)
Eosinophils Relative: 1 %
HCT: 26.3 % — ABNORMAL LOW (ref 36.0–46.0)
Hemoglobin: 7.4 g/dL — ABNORMAL LOW (ref 12.0–15.0)
Immature Granulocytes: 1 %
Lymphocytes Relative: 21 %
Lymphs Abs: 1.7 10*3/uL (ref 0.7–4.0)
MCH: 27.8 pg (ref 26.0–34.0)
MCHC: 28.1 g/dL — ABNORMAL LOW (ref 30.0–36.0)
MCV: 98.9 fL (ref 80.0–100.0)
Monocytes Absolute: 0.5 10*3/uL (ref 0.1–1.0)
Monocytes Relative: 6 %
Neutro Abs: 6 10*3/uL (ref 1.7–7.7)
Neutrophils Relative %: 71 %
PLATELETS: 234 10*3/uL (ref 150–400)
RBC: 2.66 MIL/uL — ABNORMAL LOW (ref 3.87–5.11)
RDW: 16.1 % — ABNORMAL HIGH (ref 11.5–15.5)
WBC: 8.3 10*3/uL (ref 4.0–10.5)
nRBC: 0 % (ref 0.0–0.2)

## 2018-08-28 LAB — PREPARE RBC (CROSSMATCH)

## 2018-08-28 MED ORDER — FUROSEMIDE 10 MG/ML IJ SOLN
20.0000 mg | Freq: Once | INTRAMUSCULAR | Status: AC
  Administered 2018-08-28: 20 mg via INTRAVENOUS
  Filled 2018-08-28 (×2): qty 2

## 2018-08-28 MED ORDER — SODIUM CHLORIDE 0.9% IV SOLUTION
Freq: Once | INTRAVENOUS | Status: AC
  Administered 2018-08-28: 11:00:00 via INTRAVENOUS

## 2018-08-28 NOTE — Progress Notes (Signed)
[facility] PROGRESS NOTE  HARLAN VINAL WOE:[id number] DOB: 02-16-1971 DOA: 08/27/2018  PCP: Nolene Ebbs, MD  Brief History/Interval Summary: 47 y.o. female, with morbid obesity, asthma, iron  and B12 deficiency, who was evaluated by oncology in August this year for right-sided abdominal pain and symptomatic anemia.  She underwent CT of the abdomen and pelvis showing liver and pancreatic cyst with a 19 mm lesion in the left lobe of liver and recommended for outpatient MRI.  A hepatic protocol MRI was done showing 11 mm pancreatic nodule arising from the tail and multiple hypervascular liver masses suspicious for liver metastasis. Patient underwent PET-CT scan showing hypermetabolic mass involving the cecum concerning for colonic primary neoplasm.  This was followed by liver biopsy 1 month back which showed benign hepatic tissue.  Tumor markers were normal. Patient scheduled for repeat liver biopsy by IR.  Biopsy was done successfully however following procedure patient complained of right upper quadrant pain and when she got up to ambulate she was dizzy and felt like she was going to pass out.  A follow-up CT of the abdomen postprocedure showed acute hemorrhage with a thin lateral subcapsular hemorrhage adjacent to the right lobe of liver and a larger infrahepatic hematoma (subcapsular) but with inferior extension along the right pericolic gutter into the pelvis.  Reported overall amount of hemorrhage to be moderate all 400-450 mL. Patient's vitals were stable.  Started on IV fluids and hospitalist consulted for observation overnight.  Reason for Visit: Subcapsular hematoma involving liver.  Acute blood loss anemia  Consultants: None  Procedures: Blood transfusion  Antibiotics: None  Subjective/Interval History: Patient mentions right-sided abdominal pain and cramps.  Pain is 5 out of 10 in intensity.  Denies any dizziness or lightheadedness.  Does feel fatigued.  No nausea or  vomiting.  ROS: Denies any shortness of breath or chest pain  Objective:  Vital Signs  Vitals:   08/27/18 1826 08/27/18 2022 08/28/18 0500 08/28/18 1127  BP:   113/78 112/66  Pulse:   84 94  Resp:   18   Temp:   98.6 F (37 C)   TempSrc:   Oral   SpO2:  99% 100% 100%  Weight: (!) 167 kg     Height: 5\' 10"  (1.778 m)       Intake/Output Summary (Last 24 hours) at 08/28/2018 1205 Last data filed at 08/28/2018 0037 Gross per 24 hour  Intake 1142.14 ml  Output 500 ml  Net 642.14 ml   Filed Weights   08/27/18 1826  Weight: (!) 167 kg    General appearance: alert, cooperative, appears stated age and no distress Head: Normocephalic, without obvious abnormality, atraumatic Resp: Normal effort at rest.  Diminished air entry at the bases.  No wheezing rales or rhonchi. Cardio: regular rate and rhythm, S1, S2 normal, no murmur, click, rub or gallop GI: Abdomen is soft.  Obese.  Tender in the right upper quadrant.  Dressing noted in the right upper quadrant at the site of recent liver biopsy.  No swelling noted. Extremities: extremities normal, atraumatic, no cyanosis or edema Neurologic: Alert and oriented x3.  No focal neurological deficits.  Lab Results:  Data Reviewed: I have personally reviewed following labs and imaging studies  CBC: Recent Labs  Lab 08/27/18 0951 08/27/18 1557 08/27/18 1958 08/28/18 0329  WBC 5.4 12.3*  --  8.3  NEUTROABS  --  9.8*  --  6.0  HGB 9.3* 8.4* 8.0* 7.4*  HCT 33.1* 29.3* 28.0* 26.3*  MCV 96.8 96.7  --  98.9  PLT 274 278  --  970    Basic Metabolic Panel: Recent Labs  Lab 08/27/18 1557  NA 143  K 3.4*  CL 111  CO2 25  GLUCOSE 131*  BUN 10  CREATININE 0.92  CALCIUM 7.9*    GFR: Estimated Creatinine Clearance: 126 mL/min (by C-G formula based on SCr of 0.92 mg/dL).  Liver Function Tests: Recent Labs  Lab 08/27/18 1557  AST 35  ALT 19  ALKPHOS 77  BILITOT 0.4  PROT 6.0*  ALBUMIN 2.6*     Coagulation  Profile: Recent Labs  Lab 08/27/18 0951 08/27/18 1557  INR 1.01 1.09    CBG: Recent Labs  Lab 08/27/18 1516  GLUCAP 145*      Radiology Studies: Ct Abdomen Wo Contrast  Result Date: 08/27/2018 CLINICAL DATA:  Status post CT-guided biopsy of lesion in segment VII of the right lobe of the liver Faythe Dingwall today under CT guidance. Significant abdominal pain during recovery. EXAM: CT ABDOMEN WITHOUT CONTRAST TECHNIQUE: Multidetector CT imaging of the abdomen was performed following the standard protocol without IV contrast. COMPARISON:  Imaging during liver biopsy earlier today as well as prior CT of the abdomen on 04/16/2018. FINDINGS: Lower chest: Stable scarring in the right lower lung adjacent to the major fissure. Hepatobiliary: Acute subcapsular and perihepatic hemorrhage is present after biopsy, primarily inferior to the right lobe of the liver. Lateral subcapsular component of hemorrhage adjacent to the right lobe measures approximately 1.4-1.5 cm in greatest thickness. Most of the acute hemorrhage is inferior to the right lobe where predominately subcapsular component measures approximately 7 x 10 x 12 cm. There is some extension of blood inferiorly along the right pericolic gutter and into the pelvis. Mixed density is present in the hematoma just posterior to the right lobe inferiorly. Presence of active bleeding can not be determined without administration of IV contrast. No evidence of biliary ductal dilatation. At least 1 gallstone is present in the gallbladder. Pancreas: Unremarkable. No pancreatic ductal dilatation or surrounding inflammatory changes. Spleen: Normal in size without focal abnormality. Adrenals/Urinary Tract: Adrenal glands are unremarkable. Kidneys are normal, without renal calculi, focal lesion, or hydronephrosis. Stomach/Bowel: Visualized bowel is unremarkable. No ileus, obstruction or inflammation. No free air. Vascular/Lymphatic: No enlarged lymph nodes identified.  Other: There is a moderate hiatal hernia. Musculoskeletal: No acute or significant osseous findings. IMPRESSION: Acute hemorrhage after liver biopsy consisting of a thin lateral subcapsular hemorrhage adjacent to the right lobe of the liver and a larger infrahepatic hematoma which is largely subcapsular but with some inferior extension along the right pericolic gutter into the pelvis. Overall amount of hemorrhage is moderate, likely in the 400-450 mL range based on volume estimate. These results were called by telephone at the time of interpretation on 08/27/2018 at 4:15 pm to [facility], PA-C, who verbally acknowledged these results. Electronically Signed   By: Aletta Edouard M.D.   On: 08/27/2018 16:21   Ct Biopsy  Result Date: 08/27/2018 INDICATION: 47 year old female with multifocal hypermetabolic liver lesions and a probable colonic mass concerning for colon cancer metastatic to the liver. Prior ultrasound-guided biopsy to attempt was nondiagnostic. Patient presents for attempted CT-guided biopsy. EXAM: CT BIOPSY MEDICATIONS: None. ANESTHESIA/SEDATION: Moderate (conscious) sedation was employed during this procedure. A total of Versed 2 mg and Fentanyl 100 mcg was administered intravenously. Moderate Sedation Time: 13 minutes. The patient's level of consciousness and vital signs were monitored continuously by radiology nursing throughout the procedure  under my direct supervision. FLUOROSCOPY TIME:  None COMPLICATIONS: None immediate. PROCEDURE: Informed written consent was obtained from the patient after a thorough discussion of the procedural risks, benefits and alternatives. All questions were addressed. A timeout was performed prior to the initiation of the procedure. A planning axial CT scan was performed. Hypoattenuating liver lesion in hepatic segment 7 was successfully identified. A suitable skin entry site was selected and marked. The region was sterilely prepped and draped in standard fashion  using chlorhexidine skin prep. Local anesthesia was attained by infiltration with 1% lidocaine. A small dermatotomy was made. Under intermittent CT guidance, a 20 cm 17 gauge introducer needle was carefully advanced through the liver and positioned at the margin of the hypoechoic mass. Multiple 18 gauge core biopsies were then coaxially obtained using the bio Pince automated biopsy device. Biopsy specimens were placed in formalin and delivered to pathology for further analysis. As the introducer needle was removed, the biopsy tract was embolized with a Gel-Foam slurry. The patient tolerated the procedure well. IMPRESSION: Technically successful CT-guided core biopsy of hepatic lesion. Electronically Signed   By: Jacqulynn Cadet M.D.   On: 08/27/2018 12:41   Ct Angio Abd/pel W/ And/or W/o  Result Date: 08/27/2018 CLINICAL DATA:  47 year old female with perihepatic hemorrhage status post CT-guided liver lesion biopsy. Evaluate for pseudoaneurysm or persistent bleeding. EXAM: CT ANGIOGRAPHY ABDOMEN AND PELVIS WITH CONTRAST AND WITHOUT CONTRAST TECHNIQUE: Multidetector CT imaging of the abdomen and pelvis was performed using the standard protocol during bolus administration of intravenous contrast. Multiplanar reconstructed images and MIPs were obtained and reviewed to evaluate the vascular anatomy. CONTRAST:  139mL ISOVUE-370 IOPAMIDOL (ISOVUE-370) INJECTION 76% COMPARISON:  None. FINDINGS: VASCULAR Aorta: Normal caliber aorta without aneurysm, dissection, vasculitis or significant stenosis. Celiac: Patent without evidence of aneurysm, dissection, vasculitis or significant stenosis. SMA: Patent without evidence of aneurysm, dissection, vasculitis or significant stenosis. Renals: Both renal arteries are patent without evidence of aneurysm, dissection, vasculitis, fibromuscular dysplasia or significant stenosis. IMA: Patent without evidence of aneurysm, dissection, vasculitis or significant stenosis. Inflow:  Patent without evidence of aneurysm, dissection, vasculitis or significant stenosis. Proximal Outflow: Bilateral common femoral and visualized portions of the superficial and profunda femoral arteries are patent without evidence of aneurysm, dissection, vasculitis or significant stenosis. Veins: No obvious venous abnormality within the limitations of this arterial phase study. Review of the MIP images confirms the above findings. NON-VASCULAR Lower chest: No acute abnormality. Hepatobiliary: Multiple hypoechoic hepatic lesions consistent with known/suspected multifocal hepatic metastatic disease. There is a moderate subcapsular hematoma arising from the posteromedial aspect of hepatic segment 6. The volume of the collection has not substantially changed compared to the noncontrast imaging done earlier today. The volume of hemorrhage appears stable. No evidence of active extravasation on arterial or venous phase imaging. Cholelithiasis noted incidentally. Pancreas: Unremarkable. No pancreatic ductal dilatation or surrounding inflammatory changes. Spleen: Normal in size without focal abnormality. Adrenals/Urinary Tract: Adrenal glands are unremarkable. Kidneys are normal, without renal calculi, focal lesion, or hydronephrosis. Bladder is unremarkable. Stomach/Bowel: Diffuse cecal thickening again noted consistent with patient's suspected colon malignancy. No evidence of obstruction. Lymphatic: No suspicious lymphadenopathy. Reproductive: Surgical changes of prior hysterectomy. Other: Small volume hemoperitoneum tracking along the right pericolic gutter and into the pelvic recesses. Musculoskeletal: No acute osseous abnormality. IMPRESSION: VASCULAR 1. No evidence of arterial pseudoaneurysm or active bleeding. NON-VASCULAR 1. Stable subcapsular, perihepatic and intraperitoneal hemorrhage without evidence of continued bleeding. Electronically Signed   By: Jacqulynn Cadet M.D.   On:  08/27/2018 18:27      Medications:  Scheduled: . dicyclomine  20 mg Oral TID AC  . feeding supplement (ENSURE ENLIVE)  237 mL Oral BID BM  . furosemide  20 mg Intravenous Once  . loratadine  10 mg Oral Daily  . mometasone-formoterol  2 puff Inhalation BID  . olopatadine  1 drop Both Eyes BID  . pantoprazole  40 mg Oral QAC breakfast  . vitamin B-12  1,000 mcg Oral Daily   Continuous: . sodium chloride 100 mL/hr at 08/28/18 1173   VAP:OLIDCVUDTHYHO **OR** acetaminophen, albuterol, cyclobenzaprine, diazepam, diclofenac sodium, HYDROmorphone HCl, methocarbamol, ondansetron **OR** ondansetron (ZOFRAN) IV, traMADol, triamcinolone, zolpidem    Assessment/Plan:  Subcapsular liver hematoma along with perihepatic and intraperitoneal bleeding as a result of liver biopsy Patient's hemoglobin noted to be lower this morning compared to last night.  Patient did undergo a CT angiogram yesterday evening which did not show any active bleeding.  This is reassuring.  These results were communicated to the patient.  Pain control.  Interventional radiology following.  Acute blood loss anemia As a result of the above.  Patient appears to have symptomatic anemia.  She will be transfused 2 units of PRBC.  Recheck her blood counts later today and tomorrow morning.  Dizziness with near syncope Most likely due to acute blood loss.  Stable this morning.  Continue to monitor.  Pancreatic and liver lesions suspicious for being metastatic Oncologist suspicious for a primary colonic malignancy. Cecal lesion was noted on PET scan. She also has a thyroid nodule with plans for FNA as an outpatient.    Liver biopsy done yesterday.  Further management deferred to outpatient oncology.  History of asthma Stable.  Chronic back pain and muscle spasm Continue home medications.  Hypokalemia This was repleted.  Check labs tomorrow.  DVT Prophylaxis: SCDs    Code Status: Full code Family Communication: Discussed with the  patient Disposition Plan: Management as outlined above.  Mobilize.    LOS: 1 day   [facility] Pager [phone] 08/28/2018, 12:05 PM  If 7PM-7AM, please contact night-coverage at [email or web address], password [email or web address]

## 2018-08-28 NOTE — Progress Notes (Signed)
Referring Physician(s): [place]  Supervising Physician: Jacqulynn Cadet  Patient Status:  [facility] - In-pt  Chief Complaint: Abdominal pain/bleed post liver biopsy  Subjective: Patient doing okay this morning.  Still having some right upper quadrant to right lower quadrant and right flank discomfort, not as severe as yesterday; has eaten a small amount of breakfast this morning; denies fever, headache, chest pain, or vomiting; she has some dyspnea with exertion   Allergies: Latex; Tape; and Tylenol with codeine #3 [acetaminophen-codeine]  Medications: Prior to Admission medications   Medication Sig Start Date End Date Taking? Authorizing Provider  albuterol (PROAIR HFA) 108 (90 Base) MCG/ACT inhaler INHALE TWO puffs BY MOUTH into the lungs EVERY 4 HOURS AS NEEDED FOR WHEEZING OR shortness of breath 08/19/18  Yes Valentina Shaggy, MD  Boric Acid 4 % SOLN Place 1 capsule vaginally every Friday.    Yes [provider]  cetirizine (ZYRTEC) 10 MG tablet Take 1 tablet (10 mg total) by mouth daily. 07/06/18  Yes Valentina Shaggy, MD  clobetasol ointment (TEMOVATE) 3.66 % Apply 1 application topically 2 (two) times daily. Use for Lichen Sclerosis   Yes Bovard-Stuckert, Jody, MD  Cyanocobalamin (B-12) 1000 MCG SUBL Place 1,000 mcg under the tongue daily. 04/28/18  Yes Brunetta Genera, MD  diazepam (VALIUM) 5 MG tablet Take 5 mg by mouth daily as needed for anxiety (panic attacks).    Yes Nolene Ebbs, MD  diclofenac sodium (VOLTAREN) 1 % GEL Apply 2 g topically 4 (four) times daily as needed (for back/knee pain.).    Yes [provider]  dicyclomine (BENTYL) 20 MG tablet Take 1 tablet (20 mg total) by mouth 2 (two) times daily. Patient taking differently: Take 20 mg by mouth 3 (three) times daily before meals.  04/16/18  Yes Ocie Cornfield T, PA-C  ergocalciferol (VITAMIN D2) 50000 UNITS capsule Take 50,000 Units by mouth every Monday.    Yes [provider]  lisinopril-hydrochlorothiazide (PRINZIDE,ZESTORETIC) 20-12.5 MG tablet Take 1 tablet by mouth daily.   Yes [provider]  montelukast (SINGULAIR) 10 MG tablet Take 1 tablet (10 mg total) by mouth at bedtime. 07/06/18  Yes Valentina Shaggy, MD  nystatin (NYSTATIN) powder Apply 1 g topically 2 (two) times daily as needed (irritation). Use in skin folds and under breast    Yes Bovard-Stuckert, Jody, MD  nystatin-triamcinolone (MYCOLOG II) cream Apply 1 application topically 2 (two) times daily as needed (irritation). Use in skin folds and under breast    Yes Bovard-Stuckert, Jody, MD  pantoprazole (PROTONIX) 40 MG tablet Take 40 mg by mouth daily before breakfast.  06/24/18  Yes [provider]  pimecrolimus (ELIDEL) 1 % cream Apply 1 application topically 2 (two) times daily. 07/06/18  Yes Valentina Shaggy, MD  SYMBICORT 160-4.5 MCG/ACT inhaler Inhale 2 puffs into the lungs 2 (two) times daily. Patient taking differently: Inhale 2 puffs into the lungs 2 (two) times daily.  08/05/18  Yes Valentina Shaggy, MD  tiZANidine (ZANAFLEX) 4 MG tablet Take 4 mg by mouth every 8 (eight) hours as needed for spasms.   Yes [provider]  traMADol (ULTRAM) 50 MG tablet Take 50 mg by mouth every 6 (six) hours as needed for moderate pain (back pain.).    Yes Nolene Ebbs, MD  zolpidem (AMBIEN) 5 MG tablet Take 5 mg by mouth at bedtime as needed for sleep.   Yes [provider]  cyclobenzaprine (FLEXERIL) 10 MG tablet Take 10 mg  by mouth 3 (three) times daily as needed (muscle spasms.).     [provider]  methocarbamol (ROBAXIN) 750 MG tablet Take 750 mg by mouth every 8 (eight) hours as needed (back spasms.).     [provider]  PATADAY 0.2 % SOLN Apply 2 drops to eye 2 (two) times daily as needed (for irritated eyes.). 07/30/18   Valentina Shaggy, MD  triamcinolone (NASACORT) 55 MCG/ACT AERO nasal inhaler Place 2 sprays  into the nose daily as needed (for allergies). 07/06/18   Valentina Shaggy, MD  valACYclovir (VALTREX) 500 MG tablet Take 500 mg by mouth 2 (two) times daily as needed (for cold sores).     [provider]     Vital Signs: BP 113/78 (BP Location: Right Wrist)   Pulse 84   Temp 98.6 F (37 C) (Oral)   Resp 18   Ht 5\' 10"  (1.778 m)   Wt (!) 368 lb 2.7 oz (167 kg)   SpO2 100%   BMI 52.83 kg/m   Physical Exam awake, alert.  Chest clear to auscultation bilaterally anteriorly.  Heart with regular rate and rhythm.  Abdomen obese, soft, mild to moderately tender right upper quadrant, right lower quadrant and right lower abdominal regions; puncture site right upper quadrant clean and dry  Imaging: Ct Abdomen Wo Contrast  Result Date: 08/27/2018 CLINICAL DATA:  Status post CT-guided biopsy of lesion in segment VII of the right lobe of the liver Faythe Dingwall today under CT guidance. Significant abdominal pain during recovery. EXAM: CT ABDOMEN WITHOUT CONTRAST TECHNIQUE: Multidetector CT imaging of the abdomen was performed following the standard protocol without IV contrast. COMPARISON:  Imaging during liver biopsy earlier today as well as prior CT of the abdomen on 04/16/2018. FINDINGS: Lower chest: Stable scarring in the right lower lung adjacent to the major fissure. Hepatobiliary: Acute subcapsular and perihepatic hemorrhage is present after biopsy, primarily inferior to the right lobe of the liver. Lateral subcapsular component of hemorrhage adjacent to the right lobe measures approximately 1.4-1.5 cm in greatest thickness. Most of the acute hemorrhage is inferior to the right lobe where predominately subcapsular component measures approximately 7 x 10 x 12 cm. There is some extension of blood inferiorly along the right pericolic gutter and into the pelvis. Mixed density is present in the hematoma just posterior to the right lobe inferiorly. Presence of active bleeding can not be determined  without administration of IV contrast. No evidence of biliary ductal dilatation. At least 1 gallstone is present in the gallbladder. Pancreas: Unremarkable. No pancreatic ductal dilatation or surrounding inflammatory changes. Spleen: Normal in size without focal abnormality. Adrenals/Urinary Tract: Adrenal glands are unremarkable. Kidneys are normal, without renal calculi, focal lesion, or hydronephrosis. Stomach/Bowel: Visualized bowel is unremarkable. No ileus, obstruction or inflammation. No free air. Vascular/Lymphatic: No enlarged lymph nodes identified. Other: There is a moderate hiatal hernia. Musculoskeletal: No acute or significant osseous findings. IMPRESSION: Acute hemorrhage after liver biopsy consisting of a thin lateral subcapsular hemorrhage adjacent to the right lobe of the liver and a larger infrahepatic hematoma which is largely subcapsular but with some inferior extension along the right pericolic gutter into the pelvis. Overall amount of hemorrhage is moderate, likely in the 400-450 mL range based on volume estimate. These results were called by telephone at the time of interpretation on 08/27/2018 at 4:15 pm to [facility], PA-C, who verbally acknowledged these results. Electronically Signed   By: Aletta Edouard M.D.   On: 08/27/2018 16:21  Ct Biopsy  Result Date: 08/27/2018 INDICATION: 47 year old female with multifocal hypermetabolic liver lesions and a probable colonic mass concerning for colon cancer metastatic to the liver. Prior ultrasound-guided biopsy to attempt was nondiagnostic. Patient presents for attempted CT-guided biopsy. EXAM: CT BIOPSY MEDICATIONS: None. ANESTHESIA/SEDATION: Moderate (conscious) sedation was employed during this procedure. A total of Versed 2 mg and Fentanyl 100 mcg was administered intravenously. Moderate Sedation Time: 13 minutes. The patient's level of consciousness and vital signs were monitored continuously by radiology nursing throughout the  procedure under my direct supervision. FLUOROSCOPY TIME:  None COMPLICATIONS: None immediate. PROCEDURE: Informed written consent was obtained from the patient after a thorough discussion of the procedural risks, benefits and alternatives. All questions were addressed. A timeout was performed prior to the initiation of the procedure. A planning axial CT scan was performed. Hypoattenuating liver lesion in hepatic segment 7 was successfully identified. A suitable skin entry site was selected and marked. The region was sterilely prepped and draped in standard fashion using chlorhexidine skin prep. Local anesthesia was attained by infiltration with 1% lidocaine. A small dermatotomy was made. Under intermittent CT guidance, a 20 cm 17 gauge introducer needle was carefully advanced through the liver and positioned at the margin of the hypoechoic mass. Multiple 18 gauge core biopsies were then coaxially obtained using the bio Pince automated biopsy device. Biopsy specimens were placed in formalin and delivered to pathology for further analysis. As the introducer needle was removed, the biopsy tract was embolized with a Gel-Foam slurry. The patient tolerated the procedure well. IMPRESSION: Technically successful CT-guided core biopsy of hepatic lesion. Electronically Signed   By: Jacqulynn Cadet M.D.   On: 08/27/2018 12:41   Ct Angio Abd/pel W/ And/or W/o  Result Date: 08/27/2018 CLINICAL DATA:  47 year old female with perihepatic hemorrhage status post CT-guided liver lesion biopsy. Evaluate for pseudoaneurysm or persistent bleeding. EXAM: CT ANGIOGRAPHY ABDOMEN AND PELVIS WITH CONTRAST AND WITHOUT CONTRAST TECHNIQUE: Multidetector CT imaging of the abdomen and pelvis was performed using the standard protocol during bolus administration of intravenous contrast. Multiplanar reconstructed images and MIPs were obtained and reviewed to evaluate the vascular anatomy. CONTRAST:  149mL ISOVUE-370 IOPAMIDOL (ISOVUE-370)  INJECTION 76% COMPARISON:  None. FINDINGS: VASCULAR Aorta: Normal caliber aorta without aneurysm, dissection, vasculitis or significant stenosis. Celiac: Patent without evidence of aneurysm, dissection, vasculitis or significant stenosis. SMA: Patent without evidence of aneurysm, dissection, vasculitis or significant stenosis. Renals: Both renal arteries are patent without evidence of aneurysm, dissection, vasculitis, fibromuscular dysplasia or significant stenosis. IMA: Patent without evidence of aneurysm, dissection, vasculitis or significant stenosis. Inflow: Patent without evidence of aneurysm, dissection, vasculitis or significant stenosis. Proximal Outflow: Bilateral common femoral and visualized portions of the superficial and profunda femoral arteries are patent without evidence of aneurysm, dissection, vasculitis or significant stenosis. Veins: No obvious venous abnormality within the limitations of this arterial phase study. Review of the MIP images confirms the above findings. NON-VASCULAR Lower chest: No acute abnormality. Hepatobiliary: Multiple hypoechoic hepatic lesions consistent with known/suspected multifocal hepatic metastatic disease. There is a moderate subcapsular hematoma arising from the posteromedial aspect of hepatic segment 6. The volume of the collection has not substantially changed compared to the noncontrast imaging done earlier today. The volume of hemorrhage appears stable. No evidence of active extravasation on arterial or venous phase imaging. Cholelithiasis noted incidentally. Pancreas: Unremarkable. No pancreatic ductal dilatation or surrounding inflammatory changes. Spleen: Normal in size without focal abnormality. Adrenals/Urinary Tract: Adrenal glands are unremarkable. Kidneys are normal, without renal calculi,  focal lesion, or hydronephrosis. Bladder is unremarkable. Stomach/Bowel: Diffuse cecal thickening again noted consistent with patient's suspected colon malignancy. No  evidence of obstruction. Lymphatic: No suspicious lymphadenopathy. Reproductive: Surgical changes of prior hysterectomy. Other: Small volume hemoperitoneum tracking along the right pericolic gutter and into the pelvic recesses. Musculoskeletal: No acute osseous abnormality. IMPRESSION: VASCULAR 1. No evidence of arterial pseudoaneurysm or active bleeding. NON-VASCULAR 1. Stable subcapsular, perihepatic and intraperitoneal hemorrhage without evidence of continued bleeding. Electronically Signed   By: Jacqulynn Cadet M.D.   On: 08/27/2018 18:27    Labs:  CBC: Recent Labs    08/19/18 1404 08/27/18 0951 08/27/18 1557 08/27/18 1958 08/28/18 0329  WBC 5.8 5.4 12.3*  --  8.3  HGB 10.4* 9.3* 8.4* 8.0* 7.4*  HCT 35.5* 33.1* 29.3* 28.0* 26.3*  PLT 333 274 278  --  234    COAGS: Recent Labs    07/22/18 1139 08/27/18 0951 08/27/18 1557  INR 1.06 1.01 1.09  APTT 39* 38*  --     BMP: Recent Labs    06/09/18 1319 07/06/18 1251 08/19/18 1404 08/27/18 1557  NA 145 139 144 143  K 3.8 3.9 4.2 3.4*  CL 108 108 109 111  CO2 29 24 25 25   GLUCOSE 98 85 86 131*  BUN 11 11 9 10   CALCIUM 8.8* 8.8* 9.1 7.9*  CREATININE 0.79 0.73 0.88 0.92  GFRNONAA >60 >60 >60 >60  GFRAA >60 >60 >60 >60    LIVER FUNCTION TESTS: Recent Labs    04/28/18 1224 06/09/18 1319 08/19/18 1404 08/27/18 1557  BILITOT 0.3 <0.2* 0.2* 0.4  AST 11* 11* 11* 35  ALT 11 10 7 19   ALKPHOS 78 78 91 77  PROT 7.3 7.1 7.5 6.0*  ALBUMIN 3.0* 2.8* 3.0* 2.6*    Assessment and Plan: Pt with hx subcapsular hemorrhage post liver lesion bx 12/13; path pending; afebrile; hgb 7.4(8.0); BP ok; plans are for PRBC transfusion today and additional day of observation per [facility]; appreciate their assistance; serial hgb checks; recheck chemistries in am   Electronically Signed: D. Rowe Robert, PA-C 08/28/2018, 8:55 AM   I spent a total of 15 minutes at the the patient's bedside AND on the patient's hospital floor or unit,  greater than 50% of which was counseling/coordinating care for subcapsular bleed post liver biopsy    Patient ID: Margaret [name], female   DOB: April 13, 1971, 47 y.o.   MRN: [id number]

## 2018-08-29 ENCOUNTER — Observation Stay (HOSPITAL_COMMUNITY): Payer: Medicaid Other

## 2018-08-29 DIAGNOSIS — R300 Dysuria: Secondary | ICD-10-CM | POA: Diagnosis not present

## 2018-08-29 DIAGNOSIS — R05 Cough: Secondary | ICD-10-CM | POA: Diagnosis not present

## 2018-08-29 DIAGNOSIS — K7689 Other specified diseases of liver: Secondary | ICD-10-CM | POA: Diagnosis not present

## 2018-08-29 DIAGNOSIS — D62 Acute posthemorrhagic anemia: Secondary | ICD-10-CM | POA: Diagnosis not present

## 2018-08-29 LAB — COMPREHENSIVE METABOLIC PANEL
ALT: 17 U/L (ref 0–44)
AST: 27 U/L (ref 15–41)
Albumin: 2.8 g/dL — ABNORMAL LOW (ref 3.5–5.0)
Alkaline Phosphatase: 71 U/L (ref 38–126)
Anion gap: 9 (ref 5–15)
BUN: 10 mg/dL (ref 6–20)
CO2: 25 mmol/L (ref 22–32)
Calcium: 8.5 mg/dL — ABNORMAL LOW (ref 8.9–10.3)
Chloride: 109 mmol/L (ref 98–111)
Creatinine, Ser: 1.01 mg/dL — ABNORMAL HIGH (ref 0.44–1.00)
GFR calc Af Amer: >60 mL/min (ref >60)
Glucose, Bld: 109 mg/dL — ABNORMAL HIGH (ref 70–99)
Potassium: 3.9 mmol/L (ref 3.5–5.1)
Sodium: 143 mmol/L (ref 135–145)
Total Bilirubin: 0.6 mg/dL (ref 0.3–1.2)
Total Protein: 6.6 g/dL (ref 6.5–8.1)

## 2018-08-29 LAB — CBC WITH DIFFERENTIAL/PLATELET
Abs Immature Granulocytes: 0.12 10*3/uL — ABNORMAL HIGH (ref 0.00–0.07)
BASOS PCT: 0 %
Basophils Absolute: 0 10*3/uL (ref 0.0–0.1)
Eosinophils Absolute: 0 10*3/uL (ref 0.0–0.5)
Eosinophils Relative: 0 %
HCT: 33.6 % — ABNORMAL LOW (ref 36.0–46.0)
Hemoglobin: 9.8 g/dL — ABNORMAL LOW (ref 12.0–15.0)
Immature Granulocytes: 1 %
Lymphocytes Relative: 14 %
Lymphs Abs: 1.8 10*3/uL (ref 0.7–4.0)
MCH: 28.4 pg (ref 26.0–34.0)
MCHC: 29.2 g/dL — AB (ref 30.0–36.0)
MCV: 97.4 fL (ref 80.0–100.0)
Monocytes Absolute: 1 10*3/uL (ref 0.1–1.0)
Monocytes Relative: 7 %
Neutro Abs: 10.4 10*3/uL — ABNORMAL HIGH (ref 1.7–7.7)
Neutrophils Relative %: 78 %
PLATELETS: 244 10*3/uL (ref 150–400)
RBC: 3.45 MIL/uL — ABNORMAL LOW (ref 3.87–5.11)
RDW: 16 % — ABNORMAL HIGH (ref 11.5–15.5)
WBC: 13.4 10*3/uL — ABNORMAL HIGH (ref 4.0–10.5)
nRBC: 0 % (ref 0.0–0.2)

## 2018-08-29 LAB — HEMOGLOBIN AND HEMATOCRIT, BLOOD
HCT: 32.7 % — ABNORMAL LOW (ref 36.0–46.0)
HCT: 32.8 % — ABNORMAL LOW (ref 36.0–46.0)
Hemoglobin: 9.8 g/dL — ABNORMAL LOW (ref 12.0–15.0)
Hemoglobin: 9.7 g/dL — ABNORMAL LOW (ref 12.0–15.0)

## 2018-08-29 LAB — URINALYSIS, ROUTINE W REFLEX MICROSCOPIC
BILIRUBIN URINE: NEGATIVE
GLUCOSE, UA: NEGATIVE mg/dL
Ketones, ur: NEGATIVE mg/dL
Leukocytes, UA: NEGATIVE
NITRITE: NEGATIVE
Protein, ur: NEGATIVE mg/dL
Specific Gravity, Urine: 1.021 (ref 1.005–1.030)
pH: 5 (ref 5.0–8.0)

## 2018-08-29 MED ORDER — SACCHAROMYCES BOULARDII 250 MG PO CAPS
250.0000 mg | ORAL_CAPSULE | Freq: Two times a day (BID) | ORAL | Status: DC
  Administered 2018-08-29 – 2018-09-03 (×10): 250 mg via ORAL
  Filled 2018-08-29 (×10): qty 1

## 2018-08-29 MED ORDER — SODIUM CHLORIDE 0.9 % IV SOLN
3.0000 g | Freq: Four times a day (QID) | INTRAVENOUS | Status: DC
  Administered 2018-08-29 – 2018-09-03 (×21): 3 g via INTRAVENOUS
  Filled 2018-08-29 (×22): qty 3

## 2018-08-29 NOTE — Progress Notes (Signed)
[facility] PROGRESS NOTE  Margaret [name] BWG:[id number] DOB: [birth year] DOA: 08/27/2018  PCP: Nolene Ebbs, MD  Brief History/Interval Summary: 47 y.o. female, with morbid obesity, asthma, iron  and B12 deficiency, who was evaluated by oncology in August this year for right-sided abdominal pain and symptomatic anemia.  She underwent CT of the abdomen and pelvis showing liver and pancreatic cyst with a 19 mm lesion in the left lobe of liver and recommended for outpatient MRI.  A hepatic protocol MRI was done showing 11 mm pancreatic nodule arising from the tail and multiple hypervascular liver masses suspicious for liver metastasis. Patient underwent PET-CT scan showing hypermetabolic mass involving the cecum concerning for colonic primary neoplasm.  This was followed by liver biopsy 1 month back which showed benign hepatic tissue.  Tumor markers were normal. Patient scheduled for repeat liver biopsy by IR.  Biopsy was done successfully however following procedure patient complained of right upper quadrant pain and when she got up to ambulate she was dizzy and felt like she was going to pass out.  A follow-up CT of the abdomen postprocedure showed acute hemorrhage with a thin lateral subcapsular hemorrhage adjacent to the right lobe of liver and a larger infrahepatic hematoma (subcapsular) but with inferior extension along the right pericolic gutter into the pelvis.  Reported overall amount of hemorrhage to be moderate all 400-450 mL. Patient's vitals were stable.  Started on IV fluids and hospitalist consulted for observation overnight.  Patient did have a drop in her hemoglobin.  PRBCs were ordered.  Reason for Visit: Subcapsular hematoma involving liver.  Acute blood loss anemia  Consultants: None  Procedures: Blood transfusion  Antibiotics: None  Subjective/Interval History: Patient states that she is feeling worse this morning.  She is having nausea shortness of breath.  Dry  cough.  Cramping abdominal pain in the right side.  Some dysuria.    ROS: Denies any headaches  Objective:  Vital Signs  Vitals:   08/28/18 1546 08/28/18 1625 08/28/18 1902 08/28/18 2211  BP: 117/77 127/75 133/83 128/83  Pulse: 93 98 98 98  Resp: 18 18 18 18   Temp: 100 F (37.8 C) 99.9 F (37.7 C) 100.1 F (37.8 C) (!) 100.5 F (38.1 C)  TempSrc: Oral Oral Oral Oral  SpO2: 99% 97% 92% 100%  Weight:      Height:        Intake/Output Summary (Last 24 hours) at 08/29/2018 0931 Last data filed at 08/29/2018 0640 Gross per 24 hour  Intake 3107.84 ml  Output 3400 ml  Net -292.16 ml   Filed Weights   08/27/18 1826  Weight: (!) 167 kg    General appearance: Obese.  Awake alert.  In some discomfort but in no distress. Resp: Tachypneic at rest.  No use of accessory muscles.  Shallow breaths noted.  Diminished air entry at the bases.  No wheezing or rhonchi.   Cardio: S1-S2 is normal regular.  No S3-S4.  No rubs murmurs or bruit GI: Abdomen is soft obese.  Tender in the right upper and lower quadrants.  Dressing noted in the right upper quadrant.  No bleeding is noted externally.   Extremities: No edema Neurologic: Alert and oriented x3.  No focal neurological deficits.  Lab Results:  Data Reviewed: I have personally reviewed following labs and imaging studies  CBC: Recent Labs  Lab 08/27/18 0951 08/27/18 1557 08/27/18 1958 08/28/18 0329 08/28/18 2125 08/29/18 0343  WBC 5.4 12.3*  --  8.3  --  13.4*  NEUTROABS  --  9.8*  --  6.0  --  10.4*  HGB 9.3* 8.4* 8.0* 7.4* 9.8* 9.8*  HCT 33.1* 29.3* 28.0* 26.3* 32.7* 33.6*  MCV 96.8 96.7  --  98.9  --  97.4  PLT 274 278  --  234  --  443    Basic Metabolic Panel: Recent Labs  Lab 08/27/18 1557 08/29/18 0343  NA 143 143  K 3.4* 3.9  CL 111 109  CO2 25 25  GLUCOSE 131* 109*  BUN 10 10  CREATININE 0.92 1.01*  CALCIUM 7.9* 8.5*    GFR: Estimated Creatinine Clearance: 114.8 mL/min (A) (by C-G formula based on  SCr of 1.01 mg/dL (H)).  Liver Function Tests: Recent Labs  Lab 08/27/18 1557 08/29/18 0343  AST 35 27  ALT 19 17  ALKPHOS 77 71  BILITOT 0.4 0.6  PROT 6.0* 6.6  ALBUMIN 2.6* 2.8*     Coagulation Profile: Recent Labs  Lab 08/27/18 0951 08/27/18 1557  INR 1.01 1.09    CBG: Recent Labs  Lab 08/27/18 1516  GLUCAP 145*      Radiology Studies: Ct Abdomen Wo Contrast  Result Date: 08/27/2018 CLINICAL DATA:  Status post CT-guided biopsy of lesion in segment VII of the right lobe of the liver Faythe Dingwall today under CT guidance. Significant abdominal pain during recovery. EXAM: CT ABDOMEN WITHOUT CONTRAST TECHNIQUE: Multidetector CT imaging of the abdomen was performed following the standard protocol without IV contrast. COMPARISON:  Imaging during liver biopsy earlier today as well as prior CT of the abdomen on 04/16/2018. FINDINGS: Lower chest: Stable scarring in the right lower lung adjacent to the major fissure. Hepatobiliary: Acute subcapsular and perihepatic hemorrhage is present after biopsy, primarily inferior to the right lobe of the liver. Lateral subcapsular component of hemorrhage adjacent to the right lobe measures approximately 1.4-1.5 cm in greatest thickness. Most of the acute hemorrhage is inferior to the right lobe where predominately subcapsular component measures approximately 7 x 10 x 12 cm. There is some extension of blood inferiorly along the right pericolic gutter and into the pelvis. Mixed density is present in the hematoma just posterior to the right lobe inferiorly. Presence of active bleeding can not be determined without administration of IV contrast. No evidence of biliary ductal dilatation. At least 1 gallstone is present in the gallbladder. Pancreas: Unremarkable. No pancreatic ductal dilatation or surrounding inflammatory changes. Spleen: Normal in size without focal abnormality. Adrenals/Urinary Tract: Adrenal glands are unremarkable. Kidneys are normal,  without renal calculi, focal lesion, or hydronephrosis. Stomach/Bowel: Visualized bowel is unremarkable. No ileus, obstruction or inflammation. No free air. Vascular/Lymphatic: No enlarged lymph nodes identified. Other: There is a moderate hiatal hernia. Musculoskeletal: No acute or significant osseous findings. IMPRESSION: Acute hemorrhage after liver biopsy consisting of a thin lateral subcapsular hemorrhage adjacent to the right lobe of the liver and a larger infrahepatic hematoma which is largely subcapsular but with some inferior extension along the right pericolic gutter into the pelvis. Overall amount of hemorrhage is moderate, likely in the 400-450 mL range based on volume estimate. These results were called by telephone at the time of interpretation on 08/27/2018 at 4:15 pm to [facility], PA-C, who verbally acknowledged these results. Electronically Signed   By: Aletta Edouard M.D.   On: 08/27/2018 16:21   Ct Biopsy  Result Date: 08/27/2018 INDICATION: 47 year old female with multifocal hypermetabolic liver lesions and a probable colonic mass concerning for colon cancer metastatic to the liver. Prior ultrasound-guided  biopsy to attempt was nondiagnostic. Patient presents for attempted CT-guided biopsy. EXAM: CT BIOPSY MEDICATIONS: None. ANESTHESIA/SEDATION: Moderate (conscious) sedation was employed during this procedure. A total of Versed 2 mg and Fentanyl 100 mcg was administered intravenously. Moderate Sedation Time: 13 minutes. The patient's level of consciousness and vital signs were monitored continuously by radiology nursing throughout the procedure under my direct supervision. FLUOROSCOPY TIME:  None COMPLICATIONS: None immediate. PROCEDURE: Informed written consent was obtained from the patient after a thorough discussion of the procedural risks, benefits and alternatives. All questions were addressed. A timeout was performed prior to the initiation of the procedure. A planning axial CT  scan was performed. Hypoattenuating liver lesion in hepatic segment 7 was successfully identified. A suitable skin entry site was selected and marked. The region was sterilely prepped and draped in standard fashion using chlorhexidine skin prep. Local anesthesia was attained by infiltration with 1% lidocaine. A small dermatotomy was made. Under intermittent CT guidance, a 20 cm 17 gauge introducer needle was carefully advanced through the liver and positioned at the margin of the hypoechoic mass. Multiple 18 gauge core biopsies were then coaxially obtained using the bio Pince automated biopsy device. Biopsy specimens were placed in formalin and delivered to pathology for further analysis. As the introducer needle was removed, the biopsy tract was embolized with a Gel-Foam slurry. The patient tolerated the procedure well. IMPRESSION: Technically successful CT-guided core biopsy of hepatic lesion. Electronically Signed   By: Jacqulynn Cadet M.D.   On: 08/27/2018 12:41   Ct Angio Abd/pel W/ And/or W/o  Result Date: 08/27/2018 CLINICAL DATA:  47 year old female with perihepatic hemorrhage status post CT-guided liver lesion biopsy. Evaluate for pseudoaneurysm or persistent bleeding. EXAM: CT ANGIOGRAPHY ABDOMEN AND PELVIS WITH CONTRAST AND WITHOUT CONTRAST TECHNIQUE: Multidetector CT imaging of the abdomen and pelvis was performed using the standard protocol during bolus administration of intravenous contrast. Multiplanar reconstructed images and MIPs were obtained and reviewed to evaluate the vascular anatomy. CONTRAST:  113mL ISOVUE-370 IOPAMIDOL (ISOVUE-370) INJECTION 76% COMPARISON:  None. FINDINGS: VASCULAR Aorta: Normal caliber aorta without aneurysm, dissection, vasculitis or significant stenosis. Celiac: Patent without evidence of aneurysm, dissection, vasculitis or significant stenosis. SMA: Patent without evidence of aneurysm, dissection, vasculitis or significant stenosis. Renals: Both renal arteries  are patent without evidence of aneurysm, dissection, vasculitis, fibromuscular dysplasia or significant stenosis. IMA: Patent without evidence of aneurysm, dissection, vasculitis or significant stenosis. Inflow: Patent without evidence of aneurysm, dissection, vasculitis or significant stenosis. Proximal Outflow: Bilateral common femoral and visualized portions of the superficial and profunda femoral arteries are patent without evidence of aneurysm, dissection, vasculitis or significant stenosis. Veins: No obvious venous abnormality within the limitations of this arterial phase study. Review of the MIP images confirms the above findings. NON-VASCULAR Lower chest: No acute abnormality. Hepatobiliary: Multiple hypoechoic hepatic lesions consistent with known/suspected multifocal hepatic metastatic disease. There is a moderate subcapsular hematoma arising from the posteromedial aspect of hepatic segment 6. The volume of the collection has not substantially changed compared to the noncontrast imaging done earlier today. The volume of hemorrhage appears stable. No evidence of active extravasation on arterial or venous phase imaging. Cholelithiasis noted incidentally. Pancreas: Unremarkable. No pancreatic ductal dilatation or surrounding inflammatory changes. Spleen: Normal in size without focal abnormality. Adrenals/Urinary Tract: Adrenal glands are unremarkable. Kidneys are normal, without renal calculi, focal lesion, or hydronephrosis. Bladder is unremarkable. Stomach/Bowel: Diffuse cecal thickening again noted consistent with patient's suspected colon malignancy. No evidence of obstruction. Lymphatic: No suspicious lymphadenopathy. Reproductive: Surgical  changes of prior hysterectomy. Other: Small volume hemoperitoneum tracking along the right pericolic gutter and into the pelvic recesses. Musculoskeletal: No acute osseous abnormality. IMPRESSION: VASCULAR 1. No evidence of arterial pseudoaneurysm or active bleeding.  NON-VASCULAR 1. Stable subcapsular, perihepatic and intraperitoneal hemorrhage without evidence of continued bleeding. Electronically Signed   By: Jacqulynn Cadet M.D.   On: 08/27/2018 18:27     Medications:  Scheduled: . dicyclomine  20 mg Oral TID AC  . feeding supplement (ENSURE ENLIVE)  237 mL Oral BID BM  . loratadine  10 mg Oral Daily  . mometasone-formoterol  2 puff Inhalation BID  . olopatadine  1 drop Both Eyes BID  . pantoprazole  40 mg Oral QAC breakfast  . vitamin B-12  1,000 mcg Oral Daily   Continuous: . sodium chloride 10 mL/hr at 08/29/18 0600   LOV:FIEPPIRJJOACZ **OR** acetaminophen, albuterol, cyclobenzaprine, diazepam, diclofenac sodium, HYDROmorphone HCl, methocarbamol, ondansetron **OR** ondansetron (ZOFRAN) IV, traMADol, triamcinolone, zolpidem    Assessment/Plan:  Subcapsular liver hematoma along with perihepatic and intraperitoneal bleeding as a result of liver biopsy Patient's hemoglobin trended down to 7.4.  Patient underwent CT angiogram of the abdomen which did not show any active bleeding.  She was transfused 2 units of blood.  Patient continues to have a lot of discomfort in her abdomen.  Pain control.  IR is following.  Will get a acute abdominal series today.  Hemoglobin is stable this morning after the transfusion yesterday.    Acute blood loss anemia As a result of the above.  Patient hemoglobin dropped to 7.4.  She was transfused 2 units of PRBC on 12/14 with improvement in her hemoglobin.  Continue to monitor.  Stable this morning.   Dyspnea/fever She is likely splinting due to her abdominal issues.  Incentive spirometry.  We will check acute abdominal series with chest x-ray.  Noted to have low-grade fever.  Check a UA as well as chest the patient does report dysuria.  Dizziness with near syncope Most likely due to acute blood loss.  Stable this morning.  Continue to monitor.  Pancreatic and liver lesions suspicious for being  metastatic Oncologist suspicious for a primary colonic malignancy. Cecal lesion was noted on PET scan. She also has a thyroid nodule with plans for FNA as an outpatient.    Liver biopsy done 12/13.  Further management deferred to outpatient oncology.  History of asthma Stable.  Chronic back pain and muscle spasm Continue home medications.  Hypokalemia This was repleted.  Potassium normal this morning.  DVT Prophylaxis: SCDs    Code Status: Full code Family Communication: Discussed with the patient Disposition Plan: Management as outlined above.  Follow-up on acute abdominal series.  Follow-up on UA.    LOS: 1 day   [facility] Pager [phone] 08/29/2018, 9:31 AM  If 7PM-7AM, please contact night-coverage at [email or web address], password [facility]

## 2018-08-29 NOTE — Progress Notes (Signed)
Referring Physician(s): Kale,G  Supervising Physician: Sandi Mariscal  Patient Status:  [facility] - In-pt  Chief Complaint:  Abdominal/pelvic pain/bleed post liver biopsy  Subjective: Pt doesn't feel as well today; has had some low grade temp elevations, HA, nausea, pelvic pain, dyspnea with exertion, occ cough; still felt lightheaded when attempting to get up yesterday; has had blood transfusion   Allergies: Latex; Tape; and Tylenol with codeine #3 [acetaminophen-codeine]  Medications: Prior to Admission medications   Medication Sig Start Date End Date Taking? Authorizing Provider  albuterol (PROAIR HFA) 108 (90 Base) MCG/ACT inhaler INHALE TWO puffs BY MOUTH into the lungs EVERY 4 HOURS AS NEEDED FOR WHEEZING OR shortness of breath 08/19/18  Yes Valentina Shaggy, MD  Boric Acid 4 % SOLN Place 1 capsule vaginally every Friday.    Yes [provider]  cetirizine (ZYRTEC) 10 MG tablet Take 1 tablet (10 mg total) by mouth daily. 07/06/18  Yes Valentina Shaggy, MD  clobetasol ointment (TEMOVATE) 9.83 % Apply 1 application topically 2 (two) times daily. Use for Lichen Sclerosis   Yes Bovard-Stuckert, Jody, MD  Cyanocobalamin (B-12) 1000 MCG SUBL Place 1,000 mcg under the tongue daily. 04/28/18  Yes Brunetta Genera, MD  diazepam (VALIUM) 5 MG tablet Take 5 mg by mouth daily as needed for anxiety (panic attacks).    Yes Nolene Ebbs, MD  diclofenac sodium (VOLTAREN) 1 % GEL Apply 2 g topically 4 (four) times daily as needed (for back/knee pain.).    Yes [provider]  dicyclomine (BENTYL) 20 MG tablet Take 1 tablet (20 mg total) by mouth 2 (two) times daily. Patient taking differently: Take 20 mg by mouth 3 (three) times daily before meals.  04/16/18  Yes Ocie Cornfield T, PA-C  ergocalciferol (VITAMIN D2) 50000 UNITS capsule Take 50,000 Units by mouth every Monday.    Yes [provider]  lisinopril-hydrochlorothiazide (PRINZIDE,ZESTORETIC) 20-12.5  MG tablet Take 1 tablet by mouth daily.   Yes [provider]  montelukast (SINGULAIR) 10 MG tablet Take 1 tablet (10 mg total) by mouth at bedtime. 07/06/18  Yes Valentina Shaggy, MD  nystatin (NYSTATIN) powder Apply 1 g topically 2 (two) times daily as needed (irritation). Use in skin folds and under breast    Yes Bovard-Stuckert, Jody, MD  nystatin-triamcinolone (MYCOLOG II) cream Apply 1 application topically 2 (two) times daily as needed (irritation). Use in skin folds and under breast    Yes Bovard-Stuckert, Jody, MD  pantoprazole (PROTONIX) 40 MG tablet Take 40 mg by mouth daily before breakfast.  06/24/18  Yes [provider]  pimecrolimus (ELIDEL) 1 % cream Apply 1 application topically 2 (two) times daily. 07/06/18  Yes Valentina Shaggy, MD  SYMBICORT 160-4.5 MCG/ACT inhaler Inhale 2 puffs into the lungs 2 (two) times daily. Patient taking differently: Inhale 2 puffs into the lungs 2 (two) times daily.  08/05/18  Yes Valentina Shaggy, MD  tiZANidine (ZANAFLEX) 4 MG tablet Take 4 mg by mouth every 8 (eight) hours as needed for spasms.   Yes [provider]  traMADol (ULTRAM) 50 MG tablet Take 50 mg by mouth every 6 (six) hours as needed for moderate pain (back pain.).    Yes Nolene Ebbs, MD  zolpidem (AMBIEN) 5 MG tablet Take 5 mg by mouth at bedtime as needed for sleep.   Yes [provider]  cyclobenzaprine (FLEXERIL) 10 MG tablet Take 10 mg by mouth 3 (three) times daily as needed (muscle spasms.).  [provider]  methocarbamol (ROBAXIN) 750 MG tablet Take 750 mg by mouth every 8 (eight) hours as needed (back spasms.).     [provider]  PATADAY 0.2 % SOLN Apply 2 drops to eye 2 (two) times daily as needed (for irritated eyes.). 07/30/18   Valentina Shaggy, MD  triamcinolone (NASACORT) 55 MCG/ACT AERO nasal inhaler Place 2 sprays into the nose daily as needed (for allergies). 07/06/18   Valentina Shaggy, MD  valACYclovir (VALTREX) 500 MG tablet Take 500 mg by mouth 2 (two) times daily as needed (for cold sores).     [provider]     Vital Signs: BP 128/83 (BP Location: Right Arm)   Pulse 98   Temp (!) 100.5 F (38.1 C) (Oral)   Resp 18   Ht 5\' 10"  (1.778 m)   Wt (!) 368 lb 2.7 oz (167 kg)   SpO2 100%   BMI 52.83 kg/m   Physical Exam awake/alert; chest- sl dim BS bases; heart- RRR; abd- obese, soft,+BS, notes more tenderness in central pelvic region today, not as sig in RUQ  Imaging: Ct Abdomen Wo Contrast  Result Date: 08/27/2018 CLINICAL DATA:  Status post CT-guided biopsy of lesion in segment VII of the right lobe of the liver Faythe Dingwall today under CT guidance. Significant abdominal pain during recovery. EXAM: CT ABDOMEN WITHOUT CONTRAST TECHNIQUE: Multidetector CT imaging of the abdomen was performed following the standard protocol without IV contrast. COMPARISON:  Imaging during liver biopsy earlier today as well as prior CT of the abdomen on 04/16/2018. FINDINGS: Lower chest: Stable scarring in the right lower lung adjacent to the major fissure. Hepatobiliary: Acute subcapsular and perihepatic hemorrhage is present after biopsy, primarily inferior to the right lobe of the liver. Lateral subcapsular component of hemorrhage adjacent to the right lobe measures approximately 1.4-1.5 cm in greatest thickness. Most of the acute hemorrhage is inferior to the right lobe where predominately subcapsular component measures approximately 7 x 10 x 12 cm. There is some extension of blood inferiorly along the right pericolic gutter and into the pelvis. Mixed density is present in the hematoma just posterior to the right lobe inferiorly. Presence of active bleeding can not be determined without administration of IV contrast. No evidence of biliary ductal dilatation. At least 1 gallstone is present in the gallbladder. Pancreas: Unremarkable. No pancreatic ductal dilatation or surrounding  inflammatory changes. Spleen: Normal in size without focal abnormality. Adrenals/Urinary Tract: Adrenal glands are unremarkable. Kidneys are normal, without renal calculi, focal lesion, or hydronephrosis. Stomach/Bowel: Visualized bowel is unremarkable. No ileus, obstruction or inflammation. No free air. Vascular/Lymphatic: No enlarged lymph nodes identified. Other: There is a moderate hiatal hernia. Musculoskeletal: No acute or significant osseous findings. IMPRESSION: Acute hemorrhage after liver biopsy consisting of a thin lateral subcapsular hemorrhage adjacent to the right lobe of the liver and a larger infrahepatic hematoma which is largely subcapsular but with some inferior extension along the right pericolic gutter into the pelvis. Overall amount of hemorrhage is moderate, likely in the 400-450 mL range based on volume estimate. These results were called by telephone at the time of interpretation on 08/27/2018 at 4:15 pm to [facility], PA-C, who verbally acknowledged these results. Electronically Signed   By: Aletta Edouard M.D.   On: 08/27/2018 16:21   Ct Biopsy  Result Date: 08/27/2018 INDICATION: 47 year old female with multifocal hypermetabolic liver lesions and a probable colonic mass concerning for colon cancer metastatic to the liver. Prior ultrasound-guided biopsy to  attempt was nondiagnostic. Patient presents for attempted CT-guided biopsy. EXAM: CT BIOPSY MEDICATIONS: None. ANESTHESIA/SEDATION: Moderate (conscious) sedation was employed during this procedure. A total of Versed 2 mg and Fentanyl 100 mcg was administered intravenously. Moderate Sedation Time: 13 minutes. The patient's level of consciousness and vital signs were monitored continuously by radiology nursing throughout the procedure under my direct supervision. FLUOROSCOPY TIME:  None COMPLICATIONS: None immediate. PROCEDURE: Informed written consent was obtained from the patient after a thorough discussion of the procedural  risks, benefits and alternatives. All questions were addressed. A timeout was performed prior to the initiation of the procedure. A planning axial CT scan was performed. Hypoattenuating liver lesion in hepatic segment 7 was successfully identified. A suitable skin entry site was selected and marked. The region was sterilely prepped and draped in standard fashion using chlorhexidine skin prep. Local anesthesia was attained by infiltration with 1% lidocaine. A small dermatotomy was made. Under intermittent CT guidance, a 20 cm 17 gauge introducer needle was carefully advanced through the liver and positioned at the margin of the hypoechoic mass. Multiple 18 gauge core biopsies were then coaxially obtained using the bio Pince automated biopsy device. Biopsy specimens were placed in formalin and delivered to pathology for further analysis. As the introducer needle was removed, the biopsy tract was embolized with a Gel-Foam slurry. The patient tolerated the procedure well. IMPRESSION: Technically successful CT-guided core biopsy of hepatic lesion. Electronically Signed   By: Jacqulynn Cadet M.D.   On: 08/27/2018 12:41   Ct Angio Abd/pel W/ And/or W/o  Result Date: 08/27/2018 CLINICAL DATA:  47 year old female with perihepatic hemorrhage status post CT-guided liver lesion biopsy. Evaluate for pseudoaneurysm or persistent bleeding. EXAM: CT ANGIOGRAPHY ABDOMEN AND PELVIS WITH CONTRAST AND WITHOUT CONTRAST TECHNIQUE: Multidetector CT imaging of the abdomen and pelvis was performed using the standard protocol during bolus administration of intravenous contrast. Multiplanar reconstructed images and MIPs were obtained and reviewed to evaluate the vascular anatomy. CONTRAST:  176mL ISOVUE-370 IOPAMIDOL (ISOVUE-370) INJECTION 76% COMPARISON:  None. FINDINGS: VASCULAR Aorta: Normal caliber aorta without aneurysm, dissection, vasculitis or significant stenosis. Celiac: Patent without evidence of aneurysm, dissection,  vasculitis or significant stenosis. SMA: Patent without evidence of aneurysm, dissection, vasculitis or significant stenosis. Renals: Both renal arteries are patent without evidence of aneurysm, dissection, vasculitis, fibromuscular dysplasia or significant stenosis. IMA: Patent without evidence of aneurysm, dissection, vasculitis or significant stenosis. Inflow: Patent without evidence of aneurysm, dissection, vasculitis or significant stenosis. Proximal Outflow: Bilateral common femoral and visualized portions of the superficial and profunda femoral arteries are patent without evidence of aneurysm, dissection, vasculitis or significant stenosis. Veins: No obvious venous abnormality within the limitations of this arterial phase study. Review of the MIP images confirms the above findings. NON-VASCULAR Lower chest: No acute abnormality. Hepatobiliary: Multiple hypoechoic hepatic lesions consistent with known/suspected multifocal hepatic metastatic disease. There is a moderate subcapsular hematoma arising from the posteromedial aspect of hepatic segment 6. The volume of the collection has not substantially changed compared to the noncontrast imaging done earlier today. The volume of hemorrhage appears stable. No evidence of active extravasation on arterial or venous phase imaging. Cholelithiasis noted incidentally. Pancreas: Unremarkable. No pancreatic ductal dilatation or surrounding inflammatory changes. Spleen: Normal in size without focal abnormality. Adrenals/Urinary Tract: Adrenal glands are unremarkable. Kidneys are normal, without renal calculi, focal lesion, or hydronephrosis. Bladder is unremarkable. Stomach/Bowel: Diffuse cecal thickening again noted consistent with patient's suspected colon malignancy. No evidence of obstruction. Lymphatic: No suspicious lymphadenopathy. Reproductive: Surgical changes of  prior hysterectomy. Other: Small volume hemoperitoneum tracking along the right pericolic gutter and  into the pelvic recesses. Musculoskeletal: No acute osseous abnormality. IMPRESSION: VASCULAR 1. No evidence of arterial pseudoaneurysm or active bleeding. NON-VASCULAR 1. Stable subcapsular, perihepatic and intraperitoneal hemorrhage without evidence of continued bleeding. Electronically Signed   By: Jacqulynn Cadet M.D.   On: 08/27/2018 18:27    Labs:  CBC: Recent Labs    08/27/18 0951 08/27/18 1557 08/27/18 1958 08/28/18 0329 08/28/18 2125 08/29/18 0343  WBC 5.4 12.3*  --  8.3  --  13.4*  HGB 9.3* 8.4* 8.0* 7.4* 9.8* 9.8*  HCT 33.1* 29.3* 28.0* 26.3* 32.7* 33.6*  PLT 274 278  --  234  --  244    COAGS: Recent Labs    07/22/18 1139 08/27/18 0951 08/27/18 1557  INR 1.06 1.01 1.09  APTT 39* 38*  --     BMP: Recent Labs    07/06/18 1251 08/19/18 1404 08/27/18 1557 08/29/18 0343  NA 139 144 143 143  K 3.9 4.2 3.4* 3.9  CL 108 109 111 109  CO2 24 25 25 25   GLUCOSE 85 86 131* 109*  BUN 11 9 10 10   CALCIUM 8.8* 9.1 7.9* 8.5*  CREATININE 0.73 0.88 0.92 1.01*  GFRNONAA >60 >60 >60 >60  GFRAA >60 >60 >60 >60    LIVER FUNCTION TESTS: Recent Labs    06/09/18 1319 08/19/18 1404 08/27/18 1557 08/29/18 0343  BILITOT <0.2* 0.2* 0.4 0.6  AST 11* 11* 35 27  ALT 10 7 19 17   ALKPHOS 78 91 77 71  PROT 7.1 7.5 6.0* 6.6  ALBUMIN 2.8* 3.0* 2.6* 2.8*    Assessment and Plan: Pt with hx subcapsular hemorrhage post liver lesion bx 12/13; path pending; CT angio A/P 12/13 neg for acute bleed; Tmax 100.5 at 2200 yesterday; currently 99.1, WBC 13.4 (8.3), hgb 9.8(7.4), creat 1.01; abd/chest film ordered by [facility]; check UA; would have low threshold to repeat CT ; will update IR MD   Electronically Signed: D. Rowe Robert, PA-C 08/29/2018, 9:04 AM   I spent a total of 15 minutes at the the patient's bedside AND on the patient's hospital floor or unit, greater than 50% of which was counseling/coordinating care for subcapsular bleed post liver biopsy    Patient ID: Margaret [name], female   DOB: 27-May-1971, 47 y.o.   MRN: [id number]

## 2018-08-29 NOTE — Progress Notes (Signed)
Initial Nutrition Assessment  DOCUMENTATION CODES:   Morbid obesity  INTERVENTION:   Continue Ensure Enlive po BID, each supplement provides 350 kcal and 20 grams of protein  NUTRITION DIAGNOSIS:   Increased nutrient needs related to chronic illness(Pancreatic and liver lesions ) as evidenced by estimated needs.  GOAL:   Patient will meet greater than or equal to 90% of their needs  MONITOR:   PO intake, Supplement acceptance, Labs, Weight trends, I & O's  REASON FOR ASSESSMENT:   Malnutrition Screening Tool    ASSESSMENT:   47 y.o. female, with morbid obesity, asthma, iron  and B12 deficiency, who was evaluated by oncology in August this year for right-sided abdominal pain and symptomatic anemia.  Admitted for observation following liver biopsy.  Patient not feeling well today with nausea and fever. Pt last ate some breakfast yesterday morning, completing ~30%. Ensure supplements have been ordered but pt not drinking while feeling poorly. Pt began feeling this way following liver biopsy 12/13.   Per weight records, pt has lost 19 lb since 8/2 (5% wt loss x 4.5 months, insignificant for time frame).   Labs reviewed. Medications: Vitamin B-12 tablet daily, Zofran tablet PRN    NUTRITION - FOCUSED PHYSICAL EXAM:  Nutrition focused physical exam shows no sign of depletion of muscle mass or body fat.  Diet Order:   Diet Order            Diet regular Room service appropriate? Yes; Fluid consistency: Thin  Diet effective now              EDUCATION NEEDS:   No education needs have been identified at this time  Skin:  Skin Assessment: Reviewed RN Assessment  Last BM:  12/12  Height:   Ht Readings from Last 1 Encounters:  08/27/18 5\' 10"  (1.778 m)    Weight:   Wt Readings from Last 1 Encounters:  08/27/18 (!) 167 kg    Ideal Body Weight:  68.2 kg  BMI:  Body mass index is 52.83 kg/m.  Estimated Nutritional Needs:   Kcal:  2200-2400  Protein:   90-100g  Fluid:  2L/day  Clayton Bibles, MS, RD, LDN [facility] Pager: [phone] After Hours Pager: [phone]

## 2018-08-30 DIAGNOSIS — Y848 Other medical procedures as the cause of abnormal reaction of the patient, or of later complication, without mention of misadventure at the time of the procedure: Secondary | ICD-10-CM | POA: Diagnosis not present

## 2018-08-30 DIAGNOSIS — E041 Nontoxic single thyroid nodule: Secondary | ICD-10-CM | POA: Diagnosis present

## 2018-08-30 DIAGNOSIS — I10 Essential (primary) hypertension: Secondary | ICD-10-CM | POA: Diagnosis present

## 2018-08-30 DIAGNOSIS — K769 Liver disease, unspecified: Secondary | ICD-10-CM | POA: Diagnosis present

## 2018-08-30 DIAGNOSIS — Z6841 Body Mass Index (BMI) 40.0 and over, adult: Secondary | ICD-10-CM | POA: Diagnosis not present

## 2018-08-30 DIAGNOSIS — Z90710 Acquired absence of both cervix and uterus: Secondary | ICD-10-CM | POA: Diagnosis not present

## 2018-08-30 DIAGNOSIS — I361 Nonrheumatic tricuspid (valve) insufficiency: Secondary | ICD-10-CM | POA: Diagnosis not present

## 2018-08-30 DIAGNOSIS — Z91040 Latex allergy status: Secondary | ICD-10-CM | POA: Diagnosis not present

## 2018-08-30 DIAGNOSIS — C787 Secondary malignant neoplasm of liver and intrahepatic bile duct: Secondary | ICD-10-CM | POA: Diagnosis present

## 2018-08-30 DIAGNOSIS — D62 Acute posthemorrhagic anemia: Secondary | ICD-10-CM | POA: Diagnosis present

## 2018-08-30 DIAGNOSIS — J9601 Acute respiratory failure with hypoxia: Secondary | ICD-10-CM | POA: Diagnosis not present

## 2018-08-30 DIAGNOSIS — M62838 Other muscle spasm: Secondary | ICD-10-CM | POA: Diagnosis present

## 2018-08-30 DIAGNOSIS — E876 Hypokalemia: Secondary | ICD-10-CM | POA: Diagnosis present

## 2018-08-30 DIAGNOSIS — J690 Pneumonitis due to inhalation of food and vomit: Secondary | ICD-10-CM | POA: Diagnosis not present

## 2018-08-30 DIAGNOSIS — C180 Malignant neoplasm of cecum: Secondary | ICD-10-CM | POA: Diagnosis present

## 2018-08-30 DIAGNOSIS — M1990 Unspecified osteoarthritis, unspecified site: Secondary | ICD-10-CM | POA: Diagnosis present

## 2018-08-30 DIAGNOSIS — E538 Deficiency of other specified B group vitamins: Secondary | ICD-10-CM | POA: Diagnosis present

## 2018-08-30 DIAGNOSIS — Z87891 Personal history of nicotine dependence: Secondary | ICD-10-CM | POA: Diagnosis not present

## 2018-08-30 DIAGNOSIS — J45909 Unspecified asthma, uncomplicated: Secondary | ICD-10-CM | POA: Diagnosis present

## 2018-08-30 DIAGNOSIS — G8929 Other chronic pain: Secondary | ICD-10-CM | POA: Diagnosis present

## 2018-08-30 DIAGNOSIS — G4733 Obstructive sleep apnea (adult) (pediatric): Secondary | ICD-10-CM | POA: Diagnosis present

## 2018-08-30 DIAGNOSIS — K91840 Postprocedural hemorrhage and hematoma of a digestive system organ or structure following a digestive system procedure: Secondary | ICD-10-CM | POA: Diagnosis not present

## 2018-08-30 DIAGNOSIS — R55 Syncope and collapse: Secondary | ICD-10-CM | POA: Diagnosis present

## 2018-08-30 DIAGNOSIS — M549 Dorsalgia, unspecified: Secondary | ICD-10-CM | POA: Diagnosis present

## 2018-08-30 DIAGNOSIS — L309 Dermatitis, unspecified: Secondary | ICD-10-CM | POA: Diagnosis present

## 2018-08-30 DIAGNOSIS — K7689 Other specified diseases of liver: Secondary | ICD-10-CM | POA: Diagnosis not present

## 2018-08-30 DIAGNOSIS — F419 Anxiety disorder, unspecified: Secondary | ICD-10-CM | POA: Diagnosis present

## 2018-08-30 DIAGNOSIS — C189 Malignant neoplasm of colon, unspecified: Secondary | ICD-10-CM | POA: Diagnosis not present

## 2018-08-30 LAB — URINE CULTURE: Culture: NO GROWTH

## 2018-08-30 LAB — TYPE AND SCREEN
ABO/RH(D): A POS
Antibody Screen: NEGATIVE
Unit division: 0
Unit division: 0

## 2018-08-30 LAB — BASIC METABOLIC PANEL
Anion gap: 7 (ref 5–15)
BUN: 12 mg/dL (ref 6–20)
CHLORIDE: 108 mmol/L (ref 98–111)
CO2: 26 mmol/L (ref 22–32)
Calcium: 8 mg/dL — ABNORMAL LOW (ref 8.9–10.3)
Creatinine, Ser: 1.01 mg/dL — ABNORMAL HIGH (ref 0.44–1.00)
GFR calc Af Amer: >60 mL/min (ref >60)
GFR calc non Af Amer: >60 mL/min (ref >60)
Glucose, Bld: 100 mg/dL — ABNORMAL HIGH (ref 70–99)
Potassium: 3.7 mmol/L (ref 3.5–5.1)
Sodium: 141 mmol/L (ref 135–145)

## 2018-08-30 LAB — CBC
HCT: 30.4 % — ABNORMAL LOW (ref 36.0–46.0)
Hemoglobin: 9 g/dL — ABNORMAL LOW (ref 12.0–15.0)
MCH: 28.6 pg (ref 26.0–34.0)
MCHC: 29.6 g/dL — ABNORMAL LOW (ref 30.0–36.0)
MCV: 96.5 fL (ref 80.0–100.0)
Platelets: 228 10*3/uL (ref 150–400)
RBC: 3.15 MIL/uL — ABNORMAL LOW (ref 3.87–5.11)
RDW: 16.4 % — ABNORMAL HIGH (ref 11.5–15.5)
WBC: 11.8 10*3/uL — ABNORMAL HIGH (ref 4.0–10.5)
nRBC: 0 % (ref 0.0–0.2)

## 2018-08-30 LAB — BPAM RBC
Blood Product Expiration Date: 202001082359
Blood Product Expiration Date: 202001082359
ISSUE DATE / TIME: 201912141225
ISSUE DATE / TIME: 201912141602
Unit Type and Rh: 6200
Unit Type and Rh: 6200

## 2018-08-30 LAB — LACTIC ACID, PLASMA: Lactic Acid, Venous: 1.4 mmol/L (ref 0.5–1.9)

## 2018-08-30 MED ORDER — GUAIFENESIN ER 600 MG PO TB12
600.0000 mg | ORAL_TABLET | Freq: Two times a day (BID) | ORAL | Status: DC
  Administered 2018-08-30 – 2018-09-03 (×9): 600 mg via ORAL
  Filled 2018-08-30 (×9): qty 1

## 2018-08-30 MED ORDER — GUAIFENESIN 100 MG/5ML PO SOLN
5.0000 mL | ORAL | Status: DC | PRN
  Administered 2018-08-30 – 2018-09-03 (×4): 100 mg via ORAL
  Filled 2018-08-30 (×4): qty 10

## 2018-08-30 NOTE — Progress Notes (Signed)
Referring Physician(s): Avbuere,Edwin  Supervising Physician: Markus Daft  Patient Status:  [facility] - In-pt  Chief Complaint: Follow up subcapsular hematoma post liver biopsy 12/13 by Dr. Laurence Ferrari.  Subjective:  Patient continues to have abdominal pain, mostly in the RUQ. States "I just feel bad." She also c/o continued cough with "brown chunks." She states poor appetite, has been drinking ensure but not really hungry overall. No recent BMs.  Allergies: Latex; Tape; and Tylenol with codeine #3 [acetaminophen-codeine]  Medications: Prior to Admission medications   Medication Sig Start Date End Date Taking? Authorizing Provider  albuterol (PROAIR HFA) 108 (90 Base) MCG/ACT inhaler INHALE TWO puffs BY MOUTH into the lungs EVERY 4 HOURS AS NEEDED FOR WHEEZING OR shortness of breath 08/19/18  Yes Valentina Shaggy, MD  Boric Acid 4 % SOLN Place 1 capsule vaginally every Friday.    Yes [provider]  cetirizine (ZYRTEC) 10 MG tablet Take 1 tablet (10 mg total) by mouth daily. 07/06/18  Yes Valentina Shaggy, MD  clobetasol ointment (TEMOVATE) 3.26 % Apply 1 application topically 2 (two) times daily. Use for Lichen Sclerosis   Yes Bovard-Stuckert, Jody, MD  Cyanocobalamin (B-12) 1000 MCG SUBL Place 1,000 mcg under the tongue daily. 04/28/18  Yes Brunetta Genera, MD  diazepam (VALIUM) 5 MG tablet Take 5 mg by mouth daily as needed for anxiety (panic attacks).    Yes Nolene Ebbs, MD  diclofenac sodium (VOLTAREN) 1 % GEL Apply 2 g topically 4 (four) times daily as needed (for back/knee pain.).    Yes [provider]  dicyclomine (BENTYL) 20 MG tablet Take 1 tablet (20 mg total) by mouth 2 (two) times daily. Patient taking differently: Take 20 mg by mouth 3 (three) times daily before meals.  04/16/18  Yes Ocie Cornfield T, PA-C  ergocalciferol (VITAMIN D2) 50000 UNITS capsule Take 50,000 Units by mouth every Monday.    Yes [provider]    lisinopril-hydrochlorothiazide (PRINZIDE,ZESTORETIC) 20-12.5 MG tablet Take 1 tablet by mouth daily.   Yes [provider]  montelukast (SINGULAIR) 10 MG tablet Take 1 tablet (10 mg total) by mouth at bedtime. 07/06/18  Yes Valentina Shaggy, MD  nystatin (NYSTATIN) powder Apply 1 g topically 2 (two) times daily as needed (irritation). Use in skin folds and under breast    Yes Bovard-Stuckert, Jody, MD  nystatin-triamcinolone (MYCOLOG II) cream Apply 1 application topically 2 (two) times daily as needed (irritation). Use in skin folds and under breast    Yes Bovard-Stuckert, Jody, MD  pantoprazole (PROTONIX) 40 MG tablet Take 40 mg by mouth daily before breakfast.  06/24/18  Yes [provider]  pimecrolimus (ELIDEL) 1 % cream Apply 1 application topically 2 (two) times daily. 07/06/18  Yes Valentina Shaggy, MD  SYMBICORT 160-4.5 MCG/ACT inhaler Inhale 2 puffs into the lungs 2 (two) times daily. Patient taking differently: Inhale 2 puffs into the lungs 2 (two) times daily.  08/05/18  Yes Valentina Shaggy, MD  tiZANidine (ZANAFLEX) 4 MG tablet Take 4 mg by mouth every 8 (eight) hours as needed for spasms.   Yes [provider]  traMADol (ULTRAM) 50 MG tablet Take 50 mg by mouth every 6 (six) hours as needed for moderate pain (back pain.).    Yes Nolene Ebbs, MD  zolpidem (AMBIEN) 5 MG tablet Take 5 mg by mouth at bedtime as needed for sleep.   Yes [provider]  cyclobenzaprine (FLEXERIL) 10 MG tablet Take 10 mg by  mouth 3 (three) times daily as needed (muscle spasms.).     [provider]  methocarbamol (ROBAXIN) 750 MG tablet Take 750 mg by mouth every 8 (eight) hours as needed (back spasms.).     [provider]  PATADAY 0.2 % SOLN Apply 2 drops to eye 2 (two) times daily as needed (for irritated eyes.). 07/30/18   Valentina Shaggy, MD  triamcinolone (NASACORT) 55 MCG/ACT AERO nasal inhaler Place 2 sprays into the nose  daily as needed (for allergies). 07/06/18   Valentina Shaggy, MD  valACYclovir (VALTREX) 500 MG tablet Take 500 mg by mouth 2 (two) times daily as needed (for cold sores).     [provider]     Vital Signs: BP 128/83 (BP Location: Right Arm)   Pulse 98   Temp 100.1 F (37.8 C) (Oral)   Resp 18   Ht 5\' 10"  (1.778 m)   Wt (!) 368 lb 2.7 oz (167 kg)   SpO2 93%   BMI 52.83 kg/m   Physical Exam Vitals signs and nursing note reviewed.  Constitutional:      Appearance: She is ill-appearing. She is not diaphoretic.  HENT:     Head: Normocephalic.  Cardiovascular:     Rate and Rhythm: Normal rate and regular rhythm.  Pulmonary:     Effort: Pulmonary effort is normal.     Breath sounds: Normal breath sounds.  Abdominal:     Tenderness: There is abdominal tenderness (RUQ; epigastric).  Skin:    General: Skin is warm and dry.  Neurological:     Mental Status: She is alert. Mental status is at baseline.     Imaging: Ct Abdomen Wo Contrast  Result Date: 08/27/2018 CLINICAL DATA:  Status post CT-guided biopsy of lesion in segment VII of the right lobe of the liver Faythe Dingwall today under CT guidance. Significant abdominal pain during recovery. EXAM: CT ABDOMEN WITHOUT CONTRAST TECHNIQUE: Multidetector CT imaging of the abdomen was performed following the standard protocol without IV contrast. COMPARISON:  Imaging during liver biopsy earlier today as well as prior CT of the abdomen on 04/16/2018. FINDINGS: Lower chest: Stable scarring in the right lower lung adjacent to the major fissure. Hepatobiliary: Acute subcapsular and perihepatic hemorrhage is present after biopsy, primarily inferior to the right lobe of the liver. Lateral subcapsular component of hemorrhage adjacent to the right lobe measures approximately 1.4-1.5 cm in greatest thickness. Most of the acute hemorrhage is inferior to the right lobe where predominately subcapsular component measures approximately 7 x 10 x 12  cm. There is some extension of blood inferiorly along the right pericolic gutter and into the pelvis. Mixed density is present in the hematoma just posterior to the right lobe inferiorly. Presence of active bleeding can not be determined without administration of IV contrast. No evidence of biliary ductal dilatation. At least 1 gallstone is present in the gallbladder. Pancreas: Unremarkable. No pancreatic ductal dilatation or surrounding inflammatory changes. Spleen: Normal in size without focal abnormality. Adrenals/Urinary Tract: Adrenal glands are unremarkable. Kidneys are normal, without renal calculi, focal lesion, or hydronephrosis. Stomach/Bowel: Visualized bowel is unremarkable. No ileus, obstruction or inflammation. No free air. Vascular/Lymphatic: No enlarged lymph nodes identified. Other: There is a moderate hiatal hernia. Musculoskeletal: No acute or significant osseous findings. IMPRESSION: Acute hemorrhage after liver biopsy consisting of a thin lateral subcapsular hemorrhage adjacent to the right lobe of the liver and a larger infrahepatic hematoma which is largely subcapsular but with some inferior extension along the  right pericolic gutter into the pelvis. Overall amount of hemorrhage is moderate, likely in the 400-450 mL range based on volume estimate. These results were called by telephone at the time of interpretation on 08/27/2018 at 4:15 pm to [facility], PA-C, who verbally acknowledged these results. Electronically Signed   By: Aletta Edouard M.D.   On: 08/27/2018 16:21   Ct Biopsy  Result Date: 08/27/2018 INDICATION: 47 year old female with multifocal hypermetabolic liver lesions and a probable colonic mass concerning for colon cancer metastatic to the liver. Prior ultrasound-guided biopsy to attempt was nondiagnostic. Patient presents for attempted CT-guided biopsy. EXAM: CT BIOPSY MEDICATIONS: None. ANESTHESIA/SEDATION: Moderate (conscious) sedation was employed during this  procedure. A total of Versed 2 mg and Fentanyl 100 mcg was administered intravenously. Moderate Sedation Time: 13 minutes. The patient's level of consciousness and vital signs were monitored continuously by radiology nursing throughout the procedure under my direct supervision. FLUOROSCOPY TIME:  None COMPLICATIONS: None immediate. PROCEDURE: Informed written consent was obtained from the patient after a thorough discussion of the procedural risks, benefits and alternatives. All questions were addressed. A timeout was performed prior to the initiation of the procedure. A planning axial CT scan was performed. Hypoattenuating liver lesion in hepatic segment 7 was successfully identified. A suitable skin entry site was selected and marked. The region was sterilely prepped and draped in standard fashion using chlorhexidine skin prep. Local anesthesia was attained by infiltration with 1% lidocaine. A small dermatotomy was made. Under intermittent CT guidance, a 20 cm 17 gauge introducer needle was carefully advanced through the liver and positioned at the margin of the hypoechoic mass. Multiple 18 gauge core biopsies were then coaxially obtained using the bio Pince automated biopsy device. Biopsy specimens were placed in formalin and delivered to pathology for further analysis. As the introducer needle was removed, the biopsy tract was embolized with a Gel-Foam slurry. The patient tolerated the procedure well. IMPRESSION: Technically successful CT-guided core biopsy of hepatic lesion. Electronically Signed   By: Jacqulynn Cadet M.D.   On: 08/27/2018 12:41   Dg Abd Acute W/chest  Result Date: 08/29/2018 CLINICAL DATA:  Liver biopsy 2 days ago. Right upper quadrant abdominal pain. EXAM: DG ABDOMEN ACUTE W/ 1V CHEST COMPARISON:  CT 08/27/2018.  Chest radiographs 07/06/2018. FINDINGS: 1137 hours. Significantly lower lung volumes with new bibasilar airspace opacities, probably atelectasis. No pneumothorax or large  pleural effusion identified. The heart size and mediastinal contours are stable for technique. No acute osseous findings evident. The bowel gas pattern is normal. There is no free intraperitoneal air. IMPRESSION: New bibasilar airspace opacities likely represent atelectasis given the significantly lower lung volumes. Aspiration and inflammation could have this appearance. No acute abdominal findings identified. Electronically Signed   By: Richardean Sale M.D.   On: 08/29/2018 13:02   Ct Angio Abd/pel W/ And/or W/o  Result Date: 08/27/2018 CLINICAL DATA:  47 year old female with perihepatic hemorrhage status post CT-guided liver lesion biopsy. Evaluate for pseudoaneurysm or persistent bleeding. EXAM: CT ANGIOGRAPHY ABDOMEN AND PELVIS WITH CONTRAST AND WITHOUT CONTRAST TECHNIQUE: Multidetector CT imaging of the abdomen and pelvis was performed using the standard protocol during bolus administration of intravenous contrast. Multiplanar reconstructed images and MIPs were obtained and reviewed to evaluate the vascular anatomy. CONTRAST:  160mL ISOVUE-370 IOPAMIDOL (ISOVUE-370) INJECTION 76% COMPARISON:  None. FINDINGS: VASCULAR Aorta: Normal caliber aorta without aneurysm, dissection, vasculitis or significant stenosis. Celiac: Patent without evidence of aneurysm, dissection, vasculitis or significant stenosis. SMA: Patent without evidence of aneurysm, dissection, vasculitis  or significant stenosis. Renals: Both renal arteries are patent without evidence of aneurysm, dissection, vasculitis, fibromuscular dysplasia or significant stenosis. IMA: Patent without evidence of aneurysm, dissection, vasculitis or significant stenosis. Inflow: Patent without evidence of aneurysm, dissection, vasculitis or significant stenosis. Proximal Outflow: Bilateral common femoral and visualized portions of the superficial and profunda femoral arteries are patent without evidence of aneurysm, dissection, vasculitis or significant  stenosis. Veins: No obvious venous abnormality within the limitations of this arterial phase study. Review of the MIP images confirms the above findings. NON-VASCULAR Lower chest: No acute abnormality. Hepatobiliary: Multiple hypoechoic hepatic lesions consistent with known/suspected multifocal hepatic metastatic disease. There is a moderate subcapsular hematoma arising from the posteromedial aspect of hepatic segment 6. The volume of the collection has not substantially changed compared to the noncontrast imaging done earlier today. The volume of hemorrhage appears stable. No evidence of active extravasation on arterial or venous phase imaging. Cholelithiasis noted incidentally. Pancreas: Unremarkable. No pancreatic ductal dilatation or surrounding inflammatory changes. Spleen: Normal in size without focal abnormality. Adrenals/Urinary Tract: Adrenal glands are unremarkable. Kidneys are normal, without renal calculi, focal lesion, or hydronephrosis. Bladder is unremarkable. Stomach/Bowel: Diffuse cecal thickening again noted consistent with patient's suspected colon malignancy. No evidence of obstruction. Lymphatic: No suspicious lymphadenopathy. Reproductive: Surgical changes of prior hysterectomy. Other: Small volume hemoperitoneum tracking along the right pericolic gutter and into the pelvic recesses. Musculoskeletal: No acute osseous abnormality. IMPRESSION: VASCULAR 1. No evidence of arterial pseudoaneurysm or active bleeding. NON-VASCULAR 1. Stable subcapsular, perihepatic and intraperitoneal hemorrhage without evidence of continued bleeding. Electronically Signed   By: Jacqulynn Cadet M.D.   On: 08/27/2018 18:27    Labs:  CBC: Recent Labs    08/27/18 1557  08/28/18 0329 08/28/18 2125 08/29/18 0343 08/29/18 1334 08/30/18 0344  WBC 12.3*  --  8.3  --  13.4*  --  11.8*  HGB 8.4*   < > 7.4* 9.8* 9.8* 9.7* 9.0*  HCT 29.3*   < > 26.3* 32.7* 33.6* 32.8* 30.4*  PLT 278  --  234  --  244  --  228     < > = values in this interval not displayed.    COAGS: Recent Labs    07/22/18 1139 08/27/18 0951 08/27/18 1557  INR 1.06 1.01 1.09  APTT 39* 38*  --     BMP: Recent Labs    08/19/18 1404 08/27/18 1557 08/29/18 0343 08/30/18 0344  NA 144 143 143 141  K 4.2 3.4* 3.9 3.7  CL 109 111 109 108  CO2 25 25 25 26   GLUCOSE 86 131* 109* 100*  BUN 9 10 10 12   CALCIUM 9.1 7.9* 8.5* 8.0*  CREATININE 0.88 0.92 1.01* 1.01*  GFRNONAA >60 >60 >60 >60  GFRAA >60 >60 >60 >60    LIVER FUNCTION TESTS: Recent Labs    06/09/18 1319 08/19/18 1404 08/27/18 1557 08/29/18 0343  BILITOT <0.2* 0.2* 0.4 0.6  AST 11* 11* 35 27  ALT 10 7 19 17   ALKPHOS 78 91 77 71  PROT 7.1 7.5 6.0* 6.6  ALBUMIN 2.8* 3.0* 2.6* 2.8*    Assessment and Plan:  Patient with subcapsular hematoma s/p CT guided liver biopsy 12/13 with Dr. Laurence Ferrari. Ongoing abdominal pain and new productive cough. CTA abd/pelvis 12/13 did not show evidence of continued bleeding, abdomen/chest x-ray performed yesterday showed no acute abdominal findings however new bibasilar airspace opacities were noted.   Tmax 101.6 today, WBC 11.8, hgb trending down slightly at 9.0 (previous 9.8), plt  228, creatinine stable at 1.01, urine culture negative, lactic acid 1.4 today, blood cultures are pending. She was started on Unasyn per primary team due concern for PNA - they plan to repeat CT scan tomorrow if patient remains febrile.  IR will continue to follow along.   Electronically Signed: Joaquim Nam, PA-C 08/30/2018, 4:48 PM   I spent a total of 15 Minutes at the the patient's bedside AND on the patient's hospital floor or unit, greater than 50% of which was counseling/coordinating care for subcapsular liver hematoma.

## 2018-08-30 NOTE — Progress Notes (Addendum)
[facility] PROGRESS NOTE  Margaret [name] BHA:[id number] DOB: 02-Jul-1971 DOA: 08/27/2018  PCP: Nolene Ebbs, MD  Brief History/Interval Summary: 47 y.o. female, with morbid obesity, asthma, iron  and B12 deficiency, who was evaluated by oncology in August this year for right-sided abdominal pain and symptomatic anemia.  She underwent CT of the abdomen and pelvis showing liver and pancreatic cyst with a 19 mm lesion in the left lobe of liver and recommended for outpatient MRI.  A hepatic protocol MRI was done showing 11 mm pancreatic nodule arising from the tail and multiple hypervascular liver masses suspicious for liver metastasis. Patient underwent PET-CT scan showing hypermetabolic mass involving the cecum concerning for colonic primary neoplasm.  This was followed by liver biopsy 1 month back which showed benign hepatic tissue.  Tumor markers were normal. Patient scheduled for repeat liver biopsy by IR.  Biopsy was done successfully however following procedure patient complained of right upper quadrant pain and when she got up to ambulate she was dizzy and felt like she was going to pass out.  A follow-up CT of the abdomen postprocedure showed acute hemorrhage with a thin lateral subcapsular hemorrhage adjacent to the right lobe of liver and a larger infrahepatic hematoma (subcapsular) but with inferior extension along the right pericolic gutter into the pelvis.  Reported overall amount of hemorrhage to be moderate all 400-450 mL. Patient's vitals were stable.  Started on IV fluids and hospitalist consulted for observation overnight.  Patient did have a drop in her hemoglobin.  PRBCs were ordered.  Reason for Visit: Subcapsular hematoma involving liver.  Acute blood loss anemia  Consultants: None  Procedures: Blood transfusion  Antibiotics: None  Subjective/Interval History: Patient continues to have a cough with brownish expectoration.  Continues to have abdominal pain in  the right side.  Feels poorly.  ROS: Denies any headaches  Objective:  Vital Signs  Vitals:   08/29/18 1928 08/30/18 0030 08/30/18 0150 08/30/18 0839  BP:      Pulse:      Resp:      Temp:  (!) 101.6 F (38.7 C) 99.3 F (37.4 C) 100.1 F (37.8 C)  TempSrc:  Oral Oral Oral  SpO2: 93%     Weight:      Height:       For some reason her last vital signs are not showing up above.  Her temperature at 1:35 PM was 97.5.  Blood pressure 123/80.  Heart rate 107.  Saturations 98% on 2 L.   Intake/Output Summary (Last 24 hours) at 08/30/2018 1403 Last data filed at 08/30/2018 1353 Gross per 24 hour  Intake 810 ml  Output 500 ml  Net 310 ml   Filed Weights   08/27/18 1826  Weight: (!) 167 kg    General appearance: She is obese.  Awake alert.  In no distress Resp: Remains tachypneic.  Shallow breaths.  Diminished air entry at the bases.  No wheezing or rhonchi.  Some splinting.   Crackles at the bases. Cardio: S1-S2 is normal regular.  No S3-S4.  No rubs murmurs or bruit GI: Abdomen is obese.  Soft.  Tender in the right upper and lower quadrants.  No rebound rigidity or guarding. Extremities: No edema Neurologic: Alert and oriented x3.  No focal neurological deficits.  Lab Results:  Data Reviewed: I have personally reviewed following labs and imaging studies  CBC: Recent Labs  Lab 08/27/18 0951 08/27/18 1557  08/28/18 0329 08/28/18 2125 08/29/18 0343 08/29/18 1334 08/30/18  0344  WBC 5.4 12.3*  --  8.3  --  13.4*  --  11.8*  NEUTROABS  --  9.8*  --  6.0  --  10.4*  --   --   HGB 9.3* 8.4*   < > 7.4* 9.8* 9.8* 9.7* 9.0*  HCT 33.1* 29.3*   < > 26.3* 32.7* 33.6* 32.8* 30.4*  MCV 96.8 96.7  --  98.9  --  97.4  --  96.5  PLT 274 278  --  234  --  244  --  228   < > = values in this interval not displayed.    Basic Metabolic Panel: Recent Labs  Lab 08/27/18 1557 08/29/18 0343 08/30/18 0344  NA 143 143 141  K 3.4* 3.9 3.7  CL 111 109 108  CO2 25 25 26   GLUCOSE  131* 109* 100*  BUN 10 10 12   CREATININE 0.92 1.01* 1.01*  CALCIUM 7.9* 8.5* 8.0*    GFR: Estimated Creatinine Clearance: 114.8 mL/min (A) (by C-G formula based on SCr of 1.01 mg/dL (H)).  Liver Function Tests: Recent Labs  Lab 08/27/18 1557 08/29/18 0343  AST 35 27  ALT 19 17  ALKPHOS 77 71  BILITOT 0.4 0.6  PROT 6.0* 6.6  ALBUMIN 2.6* 2.8*     Coagulation Profile: Recent Labs  Lab 08/27/18 0951 08/27/18 1557  INR 1.01 1.09    CBG: Recent Labs  Lab 08/27/18 1516  GLUCAP 145*      Radiology Studies: Dg Abd Acute W/chest  Result Date: 08/29/2018 CLINICAL DATA:  Liver biopsy 2 days ago. Right upper quadrant abdominal pain. EXAM: DG ABDOMEN ACUTE W/ 1V CHEST COMPARISON:  CT 08/27/2018.  Chest radiographs 07/06/2018. FINDINGS: 1137 hours. Significantly lower lung volumes with new bibasilar airspace opacities, probably atelectasis. No pneumothorax or large pleural effusion identified. The heart size and mediastinal contours are stable for technique. No acute osseous findings evident. The bowel gas pattern is normal. There is no free intraperitoneal air. IMPRESSION: New bibasilar airspace opacities likely represent atelectasis given the significantly lower lung volumes. Aspiration and inflammation could have this appearance. No acute abdominal findings identified. Electronically Signed   By: Richardean Sale M.D.   On: 08/29/2018 13:02     Medications:  Scheduled: . dicyclomine  20 mg Oral TID AC  . feeding supplement (ENSURE ENLIVE)  237 mL Oral BID BM  . guaiFENesin  600 mg Oral BID  . loratadine  10 mg Oral Daily  . mometasone-formoterol  2 puff Inhalation BID  . olopatadine  1 drop Both Eyes BID  . pantoprazole  40 mg Oral QAC breakfast  . saccharomyces boulardii  250 mg Oral BID  . vitamin B-12  1,000 mcg Oral Daily   Continuous: . sodium chloride 10 mL/hr at 08/29/18 1247  . ampicillin-sulbactam (UNASYN) IV 3 g (08/30/18 0837)   ZOX:[id number] **OR**  acetaminophen, albuterol, cyclobenzaprine, diazepam, diclofenac sodium, guaiFENesin, HYDROmorphone HCl, methocarbamol, ondansetron **OR** ondansetron (ZOFRAN) IV, traMADol, triamcinolone, zolpidem    Assessment/Plan:  Subcapsular liver hematoma along with perihepatic and intraperitoneal bleeding as a result of liver biopsy Patient's hemoglobin trended down to 7.4.  CT angiogram abdomen did not show any active bleeding.  She was transfused 2 units of blood.  Hemoglobin responded appropriately.  Stable for the most part.  Acute abdominal film done yesterday did not show any acute findings.  Patient noted to have temperature of 101 last night.  Patient was started on Unasyn yesterday due to concern for pneumonia identified  on the imaging studies.  If patient continues to be febrile then may have to repeat a CT scan of her abdomen and pelvis.  We will give it another 24 hours.  We will check a lactic acid level.  Continue pain control.  Acute blood loss anemia As a result of the above.  Patient hemoglobin dropped to 7.4.  She was transfused 2 units of PRBC on 12/14 with improvement in her hemoglobin.  Hemoglobin 9.0 this morning.  Recheck tomorrow.  Fever most likely due to aspiration pneumonia/acute respiratory failure with hypoxia She is likely splinting due to her abdominal issues.  Incentive spirometry.  X-ray done on 12/15 suggested atelectasis versus infection.  Due to fever she was started on Unasyn which will be continued for now.  Blood cultures and lactic acid level to be checked.  UA did not suggest infection.  Continue oxygen.  Dizziness with near syncope Most likely due to acute blood loss.  No further episodes.  Continue to monitor.  Pancreatic and liver lesions suspicious for being metastatic Oncologist suspicious for a primary colonic malignancy. Cecal lesion was noted on PET scan. She also has a thyroid nodule with plans for FNA as an outpatient.    Liver biopsy done 12/13.  Further  management deferred to outpatient oncology.  History of asthma Stable.  Chronic back pain and muscle spasm Continue home medications.  Hypokalemia Repleted  DVT Prophylaxis: SCDs    Code Status: Full code Family Communication: Discussed with the patient Disposition Plan: Management as outlined above.  Blood cultures.  Lactic acid level.  Repeat labs tomorrow.      LOS: 1 day   [facility] Pager [phone] 08/30/2018, 2:03 PM  If 7PM-7AM, please contact night-coverage at [email or web address], password [facility]

## 2018-08-31 ENCOUNTER — Ambulatory Visit (HOSPITAL_COMMUNITY): Admission: RE | Admit: 2018-08-31 | Payer: Medicaid Other | Source: Home / Self Care | Admitting: Gastroenterology

## 2018-08-31 ENCOUNTER — Encounter (HOSPITAL_COMMUNITY): Admission: RE | Payer: Self-pay | Source: Home / Self Care

## 2018-08-31 LAB — BASIC METABOLIC PANEL
Anion gap: 8 (ref 5–15)
BUN: 11 mg/dL (ref 6–20)
CO2: 27 mmol/L (ref 22–32)
Calcium: 8.2 mg/dL — ABNORMAL LOW (ref 8.9–10.3)
Chloride: 106 mmol/L (ref 98–111)
Creatinine, Ser: 0.99 mg/dL (ref 0.44–1.00)
GFR calc Af Amer: >60 mL/min (ref >60)
GFR calc non Af Amer: >60 mL/min (ref >60)
Glucose, Bld: 107 mg/dL — ABNORMAL HIGH (ref 70–99)
Potassium: 3.7 mmol/L (ref 3.5–5.1)
Sodium: 141 mmol/L (ref 135–145)

## 2018-08-31 LAB — CBC
HCT: 31 % — ABNORMAL LOW (ref 36.0–46.0)
Hemoglobin: 9.1 g/dL — ABNORMAL LOW (ref 12.0–15.0)
MCH: 28.7 pg (ref 26.0–34.0)
MCHC: 29.4 g/dL — ABNORMAL LOW (ref 30.0–36.0)
MCV: 97.8 fL (ref 80.0–100.0)
NRBC: 0 % (ref 0.0–0.2)
Platelets: 228 10*3/uL (ref 150–400)
RBC: 3.17 MIL/uL — ABNORMAL LOW (ref 3.87–5.11)
RDW: 16.3 % — ABNORMAL HIGH (ref 11.5–15.5)
WBC: 11.5 10*3/uL — ABNORMAL HIGH (ref 4.0–10.5)

## 2018-08-31 SURGERY — COLONOSCOPY WITH PROPOFOL
Anesthesia: Monitor Anesthesia Care

## 2018-08-31 MED ORDER — SODIUM CHLORIDE 0.9% FLUSH
10.0000 mL | INTRAVENOUS | Status: DC | PRN
  Administered 2018-09-01: 10 mL
  Filled 2018-08-31: qty 40

## 2018-08-31 NOTE — Plan of Care (Signed)
Pt stable at this time though started out anxious and weak overall going to bathroom. No needs at this time. No s/s of distress. Rn medicating for pain.

## 2018-08-31 NOTE — Progress Notes (Signed)
Referring Physician(s): [place]  Supervising Physician: Jacqulynn Cadet  Patient Status:  [facility] - In-pt  Chief Complaint: Abdominal/pelvic pain/bleed post liver biopsy   Subjective: Pt feeling a little bit better today.  She is currently sitting up in the chair.  She does have some dyspnea with exertion while off O2. She also notices some pelvic discomfort when coughing.  Overall abdominal pain is not any worse.  She has some occasional nausea after eating small amounts of food.    Allergies: Latex; Tape; and Tylenol with codeine #3 [acetaminophen-codeine]  Medications: Prior to Admission medications   Medication Sig Start Date End Date Taking? Authorizing Provider  albuterol (PROAIR HFA) 108 (90 Base) MCG/ACT inhaler INHALE TWO puffs BY MOUTH into the lungs EVERY 4 HOURS AS NEEDED FOR WHEEZING OR shortness of breath 08/19/18  Yes Valentina Shaggy, MD  Boric Acid 4 % SOLN Place 1 capsule vaginally every Friday.    Yes [provider]  cetirizine (ZYRTEC) 10 MG tablet Take 1 tablet (10 mg total) by mouth daily. 07/06/18  Yes Valentina Shaggy, MD  clobetasol ointment (TEMOVATE) 1.01 % Apply 1 application topically 2 (two) times daily. Use for Lichen Sclerosis   Yes Bovard-Stuckert, Jody, MD  Cyanocobalamin (B-12) 1000 MCG SUBL Place 1,000 mcg under the tongue daily. 04/28/18  Yes Brunetta Genera, MD  diazepam (VALIUM) 5 MG tablet Take 5 mg by mouth daily as needed for anxiety (panic attacks).    Yes Nolene Ebbs, MD  diclofenac sodium (VOLTAREN) 1 % GEL Apply 2 g topically 4 (four) times daily as needed (for back/knee pain.).    Yes [provider]  dicyclomine (BENTYL) 20 MG tablet Take 1 tablet (20 mg total) by mouth 2 (two) times daily. Patient taking differently: Take 20 mg by mouth 3 (three) times daily before meals.  04/16/18  Yes Ocie Cornfield T, PA-C  ergocalciferol (VITAMIN D2) 50000 UNITS capsule Take 50,000 Units by mouth every Monday.     Yes [provider]  lisinopril-hydrochlorothiazide (PRINZIDE,ZESTORETIC) 20-12.5 MG tablet Take 1 tablet by mouth daily.   Yes [provider]  montelukast (SINGULAIR) 10 MG tablet Take 1 tablet (10 mg total) by mouth at bedtime. 07/06/18  Yes Valentina Shaggy, MD  nystatin (NYSTATIN) powder Apply 1 g topically 2 (two) times daily as needed (irritation). Use in skin folds and under breast    Yes Bovard-Stuckert, Jody, MD  nystatin-triamcinolone (MYCOLOG II) cream Apply 1 application topically 2 (two) times daily as needed (irritation). Use in skin folds and under breast    Yes Bovard-Stuckert, Jody, MD  pantoprazole (PROTONIX) 40 MG tablet Take 40 mg by mouth daily before breakfast.  06/24/18  Yes [provider]  pimecrolimus (ELIDEL) 1 % cream Apply 1 application topically 2 (two) times daily. 07/06/18  Yes Valentina Shaggy, MD  SYMBICORT 160-4.5 MCG/ACT inhaler Inhale 2 puffs into the lungs 2 (two) times daily. Patient taking differently: Inhale 2 puffs into the lungs 2 (two) times daily.  08/05/18  Yes Valentina Shaggy, MD  tiZANidine (ZANAFLEX) 4 MG tablet Take 4 mg by mouth every 8 (eight) hours as needed for spasms.   Yes [provider]  traMADol (ULTRAM) 50 MG tablet Take 50 mg by mouth every 6 (six) hours as needed for moderate pain (back pain.).    Yes Nolene Ebbs, MD  zolpidem (AMBIEN) 5 MG tablet Take 5 mg by mouth at bedtime as needed for sleep.   Yes [provider]  cyclobenzaprine (FLEXERIL) 10 MG tablet Take 10 mg by mouth 3 (three) times daily as needed (muscle spasms.).     [provider]  methocarbamol (ROBAXIN) 750 MG tablet Take 750 mg by mouth every 8 (eight) hours as needed (back spasms.).     [provider]  PATADAY 0.2 % SOLN Apply 2 drops to eye 2 (two) times daily as needed (for irritated eyes.). 07/30/18   Valentina Shaggy, MD  triamcinolone (NASACORT) 55 MCG/ACT AERO nasal inhaler  Place 2 sprays into the nose daily as needed (for allergies). 07/06/18   Valentina Shaggy, MD  valACYclovir (VALTREX) 500 MG tablet Take 500 mg by mouth 2 (two) times daily as needed (for cold sores).     [provider]     Vital Signs: BP 128/83 (BP Location: Right Arm)   Pulse 98   Temp 100.1 F (37.8 C) (Oral)   Resp 18   Ht 5\' 10"  (1.778 m)   Wt (!) 368 lb 2.7 oz (167 kg)   SpO2 (!) 84% Comment: pt had removed nasal cannula  BMI 52.83 kg/m   Physical Exam awake, alert.  Chest with slightly diminished breath sounds bases.  Heart with regular rate and rhythm.  Abdomen obese, soft, positive bowel sounds, minimal tenderness right upper quadrant and central pelvic regions  Imaging: Ct Abdomen Wo Contrast  Result Date: 08/27/2018 CLINICAL DATA:  Status post CT-guided biopsy of lesion in segment VII of the right lobe of the liver Faythe Dingwall today under CT guidance. Significant abdominal pain during recovery. EXAM: CT ABDOMEN WITHOUT CONTRAST TECHNIQUE: Multidetector CT imaging of the abdomen was performed following the standard protocol without IV contrast. COMPARISON:  Imaging during liver biopsy earlier today as well as prior CT of the abdomen on 04/16/2018. FINDINGS: Lower chest: Stable scarring in the right lower lung adjacent to the major fissure. Hepatobiliary: Acute subcapsular and perihepatic hemorrhage is present after biopsy, primarily inferior to the right lobe of the liver. Lateral subcapsular component of hemorrhage adjacent to the right lobe measures approximately 1.4-1.5 cm in greatest thickness. Most of the acute hemorrhage is inferior to the right lobe where predominately subcapsular component measures approximately 7 x 10 x 12 cm. There is some extension of blood inferiorly along the right pericolic gutter and into the pelvis. Mixed density is present in the hematoma just posterior to the right lobe inferiorly. Presence of active bleeding can not be determined without  administration of IV contrast. No evidence of biliary ductal dilatation. At least 1 gallstone is present in the gallbladder. Pancreas: Unremarkable. No pancreatic ductal dilatation or surrounding inflammatory changes. Spleen: Normal in size without focal abnormality. Adrenals/Urinary Tract: Adrenal glands are unremarkable. Kidneys are normal, without renal calculi, focal lesion, or hydronephrosis. Stomach/Bowel: Visualized bowel is unremarkable. No ileus, obstruction or inflammation. No free air. Vascular/Lymphatic: No enlarged lymph nodes identified. Other: There is a moderate hiatal hernia. Musculoskeletal: No acute or significant osseous findings. IMPRESSION: Acute hemorrhage after liver biopsy consisting of a thin lateral subcapsular hemorrhage adjacent to the right lobe of the liver and a larger infrahepatic hematoma which is largely subcapsular but with some inferior extension along the right pericolic gutter into the pelvis. Overall amount of hemorrhage is moderate, likely in the 400-450 mL range based on volume estimate. These results were called by telephone at the time of interpretation on 08/27/2018 at 4:15 pm to [facility], PA-C, who verbally acknowledged these results. Electronically Signed   By: Jenness Corner.D.  On: 08/27/2018 16:21   Dg Abd Acute W/chest  Result Date: 08/29/2018 CLINICAL DATA:  Liver biopsy 2 days ago. Right upper quadrant abdominal pain. EXAM: DG ABDOMEN ACUTE W/ 1V CHEST COMPARISON:  CT 08/27/2018.  Chest radiographs 07/06/2018. FINDINGS: 1137 hours. Significantly lower lung volumes with new bibasilar airspace opacities, probably atelectasis. No pneumothorax or large pleural effusion identified. The heart size and mediastinal contours are stable for technique. No acute osseous findings evident. The bowel gas pattern is normal. There is no free intraperitoneal air. IMPRESSION: New bibasilar airspace opacities likely represent atelectasis given the significantly lower  lung volumes. Aspiration and inflammation could have this appearance. No acute abdominal findings identified. Electronically Signed   By: Richardean Sale M.D.   On: 08/29/2018 13:02   Ct Angio Abd/pel W/ And/or W/o  Result Date: 08/27/2018 CLINICAL DATA:  47 year old female with perihepatic hemorrhage status post CT-guided liver lesion biopsy. Evaluate for pseudoaneurysm or persistent bleeding. EXAM: CT ANGIOGRAPHY ABDOMEN AND PELVIS WITH CONTRAST AND WITHOUT CONTRAST TECHNIQUE: Multidetector CT imaging of the abdomen and pelvis was performed using the standard protocol during bolus administration of intravenous contrast. Multiplanar reconstructed images and MIPs were obtained and reviewed to evaluate the vascular anatomy. CONTRAST:  142mL ISOVUE-370 IOPAMIDOL (ISOVUE-370) INJECTION 76% COMPARISON:  None. FINDINGS: VASCULAR Aorta: Normal caliber aorta without aneurysm, dissection, vasculitis or significant stenosis. Celiac: Patent without evidence of aneurysm, dissection, vasculitis or significant stenosis. SMA: Patent without evidence of aneurysm, dissection, vasculitis or significant stenosis. Renals: Both renal arteries are patent without evidence of aneurysm, dissection, vasculitis, fibromuscular dysplasia or significant stenosis. IMA: Patent without evidence of aneurysm, dissection, vasculitis or significant stenosis. Inflow: Patent without evidence of aneurysm, dissection, vasculitis or significant stenosis. Proximal Outflow: Bilateral common femoral and visualized portions of the superficial and profunda femoral arteries are patent without evidence of aneurysm, dissection, vasculitis or significant stenosis. Veins: No obvious venous abnormality within the limitations of this arterial phase study. Review of the MIP images confirms the above findings. NON-VASCULAR Lower chest: No acute abnormality. Hepatobiliary: Multiple hypoechoic hepatic lesions consistent with known/suspected multifocal hepatic  metastatic disease. There is a moderate subcapsular hematoma arising from the posteromedial aspect of hepatic segment 6. The volume of the collection has not substantially changed compared to the noncontrast imaging done earlier today. The volume of hemorrhage appears stable. No evidence of active extravasation on arterial or venous phase imaging. Cholelithiasis noted incidentally. Pancreas: Unremarkable. No pancreatic ductal dilatation or surrounding inflammatory changes. Spleen: Normal in size without focal abnormality. Adrenals/Urinary Tract: Adrenal glands are unremarkable. Kidneys are normal, without renal calculi, focal lesion, or hydronephrosis. Bladder is unremarkable. Stomach/Bowel: Diffuse cecal thickening again noted consistent with patient's suspected colon malignancy. No evidence of obstruction. Lymphatic: No suspicious lymphadenopathy. Reproductive: Surgical changes of prior hysterectomy. Other: Small volume hemoperitoneum tracking along the right pericolic gutter and into the pelvic recesses. Musculoskeletal: No acute osseous abnormality. IMPRESSION: VASCULAR 1. No evidence of arterial pseudoaneurysm or active bleeding. NON-VASCULAR 1. Stable subcapsular, perihepatic and intraperitoneal hemorrhage without evidence of continued bleeding. Electronically Signed   By: Jacqulynn Cadet M.D.   On: 08/27/2018 18:27    Labs:  CBC: Recent Labs    08/28/18 0329  08/29/18 0343 08/29/18 1334 08/30/18 0344 08/31/18 0357  WBC 8.3  --  13.4*  --  11.8* 11.5*  HGB 7.4*   < > 9.8* 9.7* 9.0* 9.1*  HCT 26.3*   < > 33.6* 32.8* 30.4* 31.0*  PLT 234  --  244  --  228 228   < > =  values in this interval not displayed.    COAGS: Recent Labs    07/22/18 1139 08/27/18 0951 08/27/18 1557  INR 1.06 1.01 1.09  APTT 39* 38*  --     BMP: Recent Labs    08/27/18 1557 08/29/18 0343 08/30/18 0344 08/31/18 0357  NA 143 143 141 141  K 3.4* 3.9 3.7 3.7  CL 111 109 108 106  CO2 25 25 26 27     GLUCOSE 131* 109* 100* 107*  BUN 10 10 12 11   CALCIUM 7.9* 8.5* 8.0* 8.2*  CREATININE 0.92 1.01* 1.01* 0.99  GFRNONAA >60 >60 >60 >60  GFRAA >60 >60 >60 >60    LIVER FUNCTION TESTS: Recent Labs    06/09/18 1319 08/19/18 1404 08/27/18 1557 08/29/18 0343  BILITOT <0.2* 0.2* 0.4 0.6  AST 11* 11* 35 27  ALT 10 7 19 17   ALKPHOS 78 91 77 71  PROT 7.1 7.5 6.0* 6.6  ALBUMIN 2.8* 3.0* 2.6* 2.8*    Assessment and Plan: Pt with hx subcapsular hemorrhage post liver lesion bx 12/13;  CT angio A/P 12/13 neg for acute bleed; path reveals metastatic adenocarcinoma, likely GI in origin; afebrile, BP ok; creatinine normal, hemoglobin 9.1(9.0), WBC 11.5(11.8), blood/urine cultures negative to date; incentive spirometry/OOB; continue with lab checks; if clinically worsens obtain follow-up CT scan.  Colonoscopy once stabilized; appreciate [facility]'s medical care of patient- they will inform Dr. Irene Limbo of pt admission.   Electronically Signed: D. Rowe Robert, PA-C 08/31/2018, 3:22 PM   I spent a total of 15 minutes at the the patient's bedside AND on the patient's hospital floor or unit, greater than 50% of which was counseling/coordinating care for subcapsular bleed post liver biopsy    Patient ID: Margaret [name], female   DOB: 1970-09-16, 47 y.o.   MRN: [id number]

## 2018-08-31 NOTE — Progress Notes (Signed)
[facility] PROGRESS NOTE  KAYLEANA WAITES CBS:[id number] DOB: April 04, 1971 DOA: 08/27/2018  PCP: Nolene Ebbs, MD  Brief History/Interval Summary: 47 y.o. female, with morbid obesity, asthma, iron  and B12 deficiency, who was evaluated by oncology in August this year for right-sided abdominal pain and symptomatic anemia.  She underwent CT of the abdomen and pelvis showing liver and pancreatic cyst with a 19 mm lesion in the left lobe of liver and recommended for outpatient MRI.  A hepatic protocol MRI was done showing 11 mm pancreatic nodule arising from the tail and multiple hypervascular liver masses suspicious for liver metastasis. Patient underwent PET-CT scan showing hypermetabolic mass involving the cecum concerning for colonic primary neoplasm.  This was followed by liver biopsy 1 month back which showed benign hepatic tissue.  Tumor markers were normal. Patient scheduled for repeat liver biopsy by IR.  Biopsy was done successfully however following procedure patient complained of right upper quadrant pain and when she got up to ambulate she was dizzy and felt like she was going to pass out.  A follow-up CT of the abdomen postprocedure showed acute hemorrhage with a thin lateral subcapsular hemorrhage adjacent to the right lobe of liver and a larger infrahepatic hematoma (subcapsular) but with inferior extension along the right pericolic gutter into the pelvis.  Reported overall amount of hemorrhage to be moderate all 400-450 mL. Patient's vitals were stable.  Started on IV fluids and hospitalist consulted for observation overnight.  Patient did have a drop in her hemoglobin.  PRBCs were ordered.  Reason for Visit: Subcapsular hematoma involving liver.  Acute blood loss anemia.  Aspiration pneumonia  Consultants: None  Procedures: Blood transfusion  Antibiotics: None  Subjective/Interval History: Patient continues to have a cough with brownish expectoration.  Still feeling  short of breath.  Abdominal pain is about the same.  Some nausea but no vomiting.  ROS: Denies any headaches  Objective:  Vital Signs  Vitals:   08/30/18 0030 08/30/18 0150 08/30/18 0839 08/31/18 0946  BP:      Pulse:      Resp:      Temp: (!) 101.6 F (38.7 C) 99.3 F (37.4 C) 100.1 F (37.8 C)   TempSrc: Oral Oral Oral   SpO2:    (!) 84%  Weight:      Height:       For unclear reasons the entire set of vital signs and not showing up above.  Temperature was 99.9 earlier this morning.  Blood pressure 150/67.  Heart rate 95.  Saturation 99% on 2 L.  84% on room air.    Intake/Output Summary (Last 24 hours) at 08/31/2018 1307 Last data filed at 08/31/2018 1011 Gross per 24 hour  Intake 1612.92 ml  Output 1150 ml  Net 462.92 ml   Filed Weights   08/27/18 1826  Weight: (!) 167 kg    General appearance: She is obese.  Awake alert.  In no distress Resp: Remains tachypneic.  Shallow respirations.  Diminished air entry at the bases.  Few crackles at the bases.  No wheezing or rhonchi. Cardio: S2 is normal regular.  No S3-S4.  No rubs murmurs or bruit.  No pedal edema GI: Abdomen is obese.  Soft.  Less tender in the right upper and lower quadrant compared to yesterday.  No rebound rigidity or guarding. Extremities: No edema Neurologic: Noted and oriented x3.  No focal neurological deficits  Lab Results:  Data Reviewed: I have personally reviewed following labs and  imaging studies  CBC: Recent Labs  Lab 08/27/18 1557  08/28/18 0329 08/28/18 2125 08/29/18 0343 08/29/18 1334 08/30/18 0344 08/31/18 0357  WBC 12.3*  --  8.3  --  13.4*  --  11.8* 11.5*  NEUTROABS 9.8*  --  6.0  --  10.4*  --   --   --   HGB 8.4*   < > 7.4* 9.8* 9.8* 9.7* 9.0* 9.1*  HCT 29.3*   < > 26.3* 32.7* 33.6* 32.8* 30.4* 31.0*  MCV 96.7  --  98.9  --  97.4  --  96.5 97.8  PLT 278  --  234  --  244  --  228 228   < > = values in this interval not displayed.    Basic Metabolic Panel: Recent  Labs  Lab 08/27/18 1557 08/29/18 0343 08/30/18 0344 08/31/18 0357  NA 143 143 141 141  K 3.4* 3.9 3.7 3.7  CL 111 109 108 106  CO2 25 25 26 27   GLUCOSE 131* 109* 100* 107*  BUN 10 10 12 11   CREATININE 0.92 1.01* 1.01* 0.99  CALCIUM 7.9* 8.5* 8.0* 8.2*    GFR: Estimated Creatinine Clearance: 117.1 mL/min (by C-G formula based on SCr of 0.99 mg/dL).  Liver Function Tests: Recent Labs  Lab 08/27/18 1557 08/29/18 0343  AST 35 27  ALT 19 17  ALKPHOS 77 71  BILITOT 0.4 0.6  PROT 6.0* 6.6  ALBUMIN 2.6* 2.8*     Coagulation Profile: Recent Labs  Lab 08/27/18 0951 08/27/18 1557  INR 1.01 1.09    CBG: Recent Labs  Lab 08/27/18 1516  GLUCAP 145*      Radiology Studies: No results found.   Medications:  Scheduled: . dicyclomine  20 mg Oral TID AC  . feeding supplement (ENSURE ENLIVE)  237 mL Oral BID BM  . guaiFENesin  600 mg Oral BID  . loratadine  10 mg Oral Daily  . mometasone-formoterol  2 puff Inhalation BID  . olopatadine  1 drop Both Eyes BID  . pantoprazole  40 mg Oral QAC breakfast  . saccharomyces boulardii  250 mg Oral BID  . vitamin B-12  1,000 mcg Oral Daily   Continuous: . sodium chloride 10 mL/hr at 08/29/18 1247  . ampicillin-sulbactam (UNASYN) IV 3 g (08/31/18 0732)   DZH:[id number] **OR** acetaminophen, albuterol, cyclobenzaprine, diazepam, diclofenac sodium, guaiFENesin, HYDROmorphone HCl, methocarbamol, ondansetron **OR** ondansetron (ZOFRAN) IV, traMADol, triamcinolone, zolpidem    Assessment/Plan:  Subcapsular liver hematoma along with perihepatic and intraperitoneal bleeding as a result of liver biopsy Patient's hemoglobin trended down to 7.4.  She was transfused 2 units of blood.  Hemoglobin responded appropriately.  She continues to have abdominal pain and tenderness although she appears to be more comfortable today compared to yesterday.  Acute abdominal series on 12/15 did not show any acute findings.  Fever appears to be  subsiding of the fever is thought to be secondary to aspiration pneumonia.  Hemoglobin is stable for the most part.  Lactic acid level was normal.  If she does not improve in the next 24 to 48 hours or if she again starts spiking fever we will need to repeat the CT scan of her abdomen and pelvis.    Acute blood loss anemia As a result of the above.  Patient hemoglobin dropped to 7.4.  She was transfused 2 units of PRBC on 12/14 with improvement in her hemoglobin.  Globin is stable.  Continue to monitor.  Fever due to aspiration pneumonia/acute  respiratory failure with hypoxia She is likely splinting due to her abdominal issues.  Incentive spirometry.  X-ray done on 12/15 suggested atelectasis versus infection.  Due to fever she was started on Unasyn which will be continued for now.  Cultures pending.  Lactic acid level normal.  UA did not suggest infection.  She continues to require oxygen.  She drops to 84% on room air.  WBC is stable.  Dizziness with near syncope Most likely due to acute blood loss.  No further episodes.  Continue to monitor.  Pancreatic and liver lesions suspicious for being metastatic Oncologist suspicious for a primary colonic malignancy. Cecal lesion was noted on PET scan. She also has a thyroid nodule with plans for FNA as an outpatient.    Liver biopsy done 12/13.  Further management deferred to outpatient oncology.  Will let Dr. Irene Limbo know that she is in the hospital.  History of asthma Stable.  Chronic back pain and muscle spasm Continue home medications.  Hypokalemia Repleted  DVT Prophylaxis: SCDs    Code Status: Full code Family Communication: Discussed with the patient Disposition Plan:  Start mobilizing.  Continue treatment as outlined.  Incentive spirometry.    LOS: 2 days   [facility] Pager [phone] 08/31/2018, 1:07 PM  If 7PM-7AM, please contact night-coverage at [email or web address], password [facility]

## 2018-09-01 ENCOUNTER — Encounter (HOSPITAL_COMMUNITY): Payer: Medicaid Other

## 2018-09-01 DIAGNOSIS — E876 Hypokalemia: Secondary | ICD-10-CM

## 2018-09-01 LAB — COMPREHENSIVE METABOLIC PANEL
ALBUMIN: 2.2 g/dL — AB (ref 3.5–5.0)
ALT: 20 U/L (ref 0–44)
AST: 24 U/L (ref 15–41)
Alkaline Phosphatase: 76 U/L (ref 38–126)
Anion gap: 9 (ref 5–15)
BUN: 13 mg/dL (ref 6–20)
CALCIUM: 7.9 mg/dL — AB (ref 8.9–10.3)
CO2: 27 mmol/L (ref 22–32)
Chloride: 105 mmol/L (ref 98–111)
Creatinine, Ser: 0.91 mg/dL (ref 0.44–1.00)
GFR calc Af Amer: >60 mL/min (ref >60)
GFR calc non Af Amer: >60 mL/min (ref >60)
Glucose, Bld: 95 mg/dL (ref 70–99)
Potassium: 3.4 mmol/L — ABNORMAL LOW (ref 3.5–5.1)
Sodium: 141 mmol/L (ref 135–145)
Total Bilirubin: 0.7 mg/dL (ref 0.3–1.2)
Total Protein: 6.1 g/dL — ABNORMAL LOW (ref 6.5–8.1)

## 2018-09-01 LAB — CBC
HCT: 26.9 % — ABNORMAL LOW (ref 36.0–46.0)
HEMOGLOBIN: 8 g/dL — AB (ref 12.0–15.0)
MCH: 29.3 pg (ref 26.0–34.0)
MCHC: 29.7 g/dL — ABNORMAL LOW (ref 30.0–36.0)
MCV: 98.5 fL (ref 80.0–100.0)
Platelets: 241 10*3/uL (ref 150–400)
RBC: 2.73 MIL/uL — ABNORMAL LOW (ref 3.87–5.11)
RDW: 16.7 % — ABNORMAL HIGH (ref 11.5–15.5)
WBC: 9.7 10*3/uL (ref 4.0–10.5)
nRBC: 0 % (ref 0.0–0.2)

## 2018-09-01 NOTE — Progress Notes (Signed)
Referring Physician(s): Kale,G  Supervising Physician: Marybelle Killings  Patient Status:  [facility] - In-pt  Chief Complaint: Abdominal/pelvic pain/bleed post liver biopsy   Subjective: Patient feeling a little better today.  She is currently sitting up in chair.  Tolerating Ensure okay but not eating much in the way of solid food; she does have some mid pelvic discomfort when coughing.  She also still has some dyspnea when ambulating; denies chest pain, hemoptysis.  Some occasional nausea, no vomiting.   Allergies: Latex; Tape; and Tylenol with codeine #3 [acetaminophen-codeine]  Medications: Prior to Admission medications   Medication Sig Start Date End Date Taking? Authorizing Provider  albuterol (PROAIR HFA) 108 (90 Base) MCG/ACT inhaler INHALE TWO puffs BY MOUTH into the lungs EVERY 4 HOURS AS NEEDED FOR WHEEZING OR shortness of breath 08/19/18  Yes Valentina Shaggy, MD  Boric Acid 4 % SOLN Place 1 capsule vaginally every Friday.    Yes [provider]  cetirizine (ZYRTEC) 10 MG tablet Take 1 tablet (10 mg total) by mouth daily. 07/06/18  Yes Valentina Shaggy, MD  clobetasol ointment (TEMOVATE) 6.33 % Apply 1 application topically 2 (two) times daily. Use for Lichen Sclerosis   Yes Bovard-Stuckert, Jody, MD  Cyanocobalamin (B-12) 1000 MCG SUBL Place 1,000 mcg under the tongue daily. 04/28/18  Yes Brunetta Genera, MD  diazepam (VALIUM) 5 MG tablet Take 5 mg by mouth daily as needed for anxiety (panic attacks).    Yes Nolene Ebbs, MD  diclofenac sodium (VOLTAREN) 1 % GEL Apply 2 g topically 4 (four) times daily as needed (for back/knee pain.).    Yes [provider]  dicyclomine (BENTYL) 20 MG tablet Take 1 tablet (20 mg total) by mouth 2 (two) times daily. Patient taking differently: Take 20 mg by mouth 3 (three) times daily before meals.  04/16/18  Yes Ocie Cornfield T, PA-C  ergocalciferol (VITAMIN D2) 50000 UNITS capsule Take 50,000 Units by mouth  every Monday.    Yes [provider]  lisinopril-hydrochlorothiazide (PRINZIDE,ZESTORETIC) 20-12.5 MG tablet Take 1 tablet by mouth daily.   Yes [provider]  montelukast (SINGULAIR) 10 MG tablet Take 1 tablet (10 mg total) by mouth at bedtime. 07/06/18  Yes Valentina Shaggy, MD  nystatin (NYSTATIN) powder Apply 1 g topically 2 (two) times daily as needed (irritation). Use in skin folds and under breast    Yes Bovard-Stuckert, Jody, MD  nystatin-triamcinolone (MYCOLOG II) cream Apply 1 application topically 2 (two) times daily as needed (irritation). Use in skin folds and under breast    Yes Bovard-Stuckert, Jody, MD  pantoprazole (PROTONIX) 40 MG tablet Take 40 mg by mouth daily before breakfast.  06/24/18  Yes [provider]  pimecrolimus (ELIDEL) 1 % cream Apply 1 application topically 2 (two) times daily. 07/06/18  Yes Valentina Shaggy, MD  SYMBICORT 160-4.5 MCG/ACT inhaler Inhale 2 puffs into the lungs 2 (two) times daily. Patient taking differently: Inhale 2 puffs into the lungs 2 (two) times daily.  08/05/18  Yes Valentina Shaggy, MD  tiZANidine (ZANAFLEX) 4 MG tablet Take 4 mg by mouth every 8 (eight) hours as needed for spasms.   Yes [provider]  traMADol (ULTRAM) 50 MG tablet Take 50 mg by mouth every 6 (six) hours as needed for moderate pain (back pain.).    Yes Nolene Ebbs, MD  zolpidem (AMBIEN) 5 MG tablet Take 5 mg by mouth at bedtime as needed for sleep.   Yes [provider]  cyclobenzaprine (FLEXERIL) 10 MG tablet Take 10 mg by mouth 3 (three) times daily as needed (muscle spasms.).     [provider]  methocarbamol (ROBAXIN) 750 MG tablet Take 750 mg by mouth every 8 (eight) hours as needed (back spasms.).     [provider]  PATADAY 0.2 % SOLN Apply 2 drops to eye 2 (two) times daily as needed (for irritated eyes.). 07/30/18   Valentina Shaggy, MD  triamcinolone (NASACORT) 55 MCG/ACT AERO  nasal inhaler Place 2 sprays into the nose daily as needed (for allergies). 07/06/18   Valentina Shaggy, MD  valACYclovir (VALTREX) 500 MG tablet Take 500 mg by mouth 2 (two) times daily as needed (for cold sores).     [provider]     Vital Signs: BP 128/83 (BP Location: Right Arm)   Pulse 98   Temp 98.3 F (36.8 C) (Oral)   Resp 18   Ht 5\' 10"  (1.778 m)   Wt (!) 368 lb 2.7 oz (167 kg)   SpO2 (!) 84% Comment: pt had removed nasal cannula  BMI 52.83 kg/m   Physical Exam awake, alert.  Chest with slightly diminished breath sounds bases.  Heart with regular rate and rhythm.  Abdomen obese, soft, positive bowel sounds, minimal tenderness right upper quadrant and central pelvic regions  Imaging: Dg Abd Acute W/chest  Result Date: 08/29/2018 CLINICAL DATA:  Liver biopsy 2 days ago. Right upper quadrant abdominal pain. EXAM: DG ABDOMEN ACUTE W/ 1V CHEST COMPARISON:  CT 08/27/2018.  Chest radiographs 07/06/2018. FINDINGS: 1137 hours. Significantly lower lung volumes with new bibasilar airspace opacities, probably atelectasis. No pneumothorax or large pleural effusion identified. The heart size and mediastinal contours are stable for technique. No acute osseous findings evident. The bowel gas pattern is normal. There is no free intraperitoneal air. IMPRESSION: New bibasilar airspace opacities likely represent atelectasis given the significantly lower lung volumes. Aspiration and inflammation could have this appearance. No acute abdominal findings identified. Electronically Signed   By: Richardean Sale M.D.   On: 08/29/2018 13:02    Labs:  CBC: Recent Labs    08/29/18 0343 08/29/18 1334 08/30/18 0344 08/31/18 0357 09/01/18 0413  WBC 13.4*  --  11.8* 11.5* 9.7  HGB 9.8* 9.7* 9.0* 9.1* 8.0*  HCT 33.6* 32.8* 30.4* 31.0* 26.9*  PLT 244  --  228 228 241    COAGS: Recent Labs    07/22/18 1139 08/27/18 0951 08/27/18 1557  INR 1.06 1.01 1.09  APTT 39* 38*  --      BMP: Recent Labs    08/29/18 0343 08/30/18 0344 08/31/18 0357 09/01/18 0413  NA 143 141 141 141  K 3.9 3.7 3.7 3.4*  CL 109 108 106 105  CO2 25 26 27 27   GLUCOSE 109* 100* 107* 95  BUN 10 12 11 13   CALCIUM 8.5* 8.0* 8.2* 7.9*  CREATININE 1.01* 1.01* 0.99 0.91  GFRNONAA >60 >60 >60 >60  GFRAA >60 >60 >60 >60    LIVER FUNCTION TESTS: Recent Labs    08/19/18 1404 08/27/18 1557 08/29/18 0343 09/01/18 0413  BILITOT 0.2* 0.4 0.6 0.7  AST 11* 35 27 24  ALT 7 19 17 20   ALKPHOS 91 77 71 76  PROT 7.5 6.0* 6.6 6.1*  ALBUMIN 3.0* 2.6* 2.8* 2.2*    Assessment and Plan: Pt with hx subcapsular hemorrhage post liver lesion bx 12/13; CT angio A/P 12/13 neg for acute bleed; path reveals metastatic adenocarcinoma, likely GI in  origin; afebrile, BP ok; creatinine normal;WBC nl; hgb 8(9.1), creat nl; would have low threshold to repeat CT esp if hgb cont to drop; cont IS; SCD's as pt at risk for VTE; awaiting oncology f/u; colonoscopy once stabilized; appreciate TRH care of pt   Electronically Signed: D. Rowe Robert, PA-C 09/01/2018, 2:07 PM   I spent a total of 15 minutes at the the patient's bedside AND on the patient's hospital floor or unit, greater than 50% of which was counseling/coordinating care for subcapsular bleed post liver biopsy    Patient ID: Margaret [name], female   DOB: Dec 24, 1970, 47 y.o.   MRN: [id number]

## 2018-09-01 NOTE — Progress Notes (Signed)
Patient ID: Margaret [name], female   DOB: 1971-04-11, 47 y.o.   MRN: [id number]  PROGRESS NOTE    Margaret [name]  SJG:[id number] DOB: 21-May-1971 DOA: 08/27/2018 PCP: Nolene Ebbs, MD    Brief Narrative:   47 y.o.female,with morbid obesity, asthma, iron and B12 deficiency, who was evaluated by oncology in August this year for right-sided abdominal pain and symptomatic anemia. She underwent CT of the abdomen and pelvis showing liver and pancreatic cyst with a 19 mm lesion in the left lobe of liver and recommended for outpatient MRI. A hepatic protocol MRI was done showing 11 mm pancreatic nodule arising from the tail and multiple hypervascular liver masses suspicious for liver metastasis. Patient underwent PET-CT scan showing hypermetabolic mass involving the cecum concerning for colonic primary neoplasm. This was followed by liver biopsy 1 month back which showed benign hepatic tissue. Tumor markers were normal. Patient scheduled for repeat liver biopsy by IR. Biopsy was done successfully however following procedure patient complained of right upper quadrant pain and when she got up to ambulate she was dizzy and felt like she was going to pass out. A follow-up CT of the abdomen postprocedure showed acute hemorrhage with a thin lateral subcapsular hemorrhage adjacent to the right lobe of liver and a larger infrahepatic hematoma (subcapsular) but with inferior extension along the right pericolic gutter into the pelvis. Reported overall amount of hemorrhage to be moderate all 400-450 mL. Patient's vitals were stable. Started on IV fluids and hospitalist consulted for observation overnight.  Patient did have a drop in her hemoglobin.   Assessment & Plan:   Principal Problem:   Acute blood loss anemia Active Problems:   Subcapsular hematoma of liver   Metastatic carcinoma to liver (HCC)   Aspiration pneumonia (HCC)   Hypokalemia   #1 acute blood loss anemia: Secondary to  subcapsular hematoma of the liver.  Patient has received 2 units of packed red blood cells and currently hemoglobin is still down to 8 g from 9.1 g yesterday.  We will observe patient overnight and recheck her hemoglobin.  We need to see is stabilized at least 24 hours prior to discharge.  #2 metastatic adenocarcinoma of the liver: Presumed secondary.  GI presumed to be primary.  Discussed with patient today about her diagnosis.  I have also consulted her oncologist to discuss options with her.  Dr. Irene Limbo will see patient  #3 hypokalemia: Potassium repleted.  #4 aspiration pneumonia: Continue empiric antibiotics  #5 subcapsular hematoma of the liver: Following liver biopsy.  Continue aspirin No. 1   DVT prophylaxis: SCD Code Status: Full Family Communication: Discuss with patient. Disposition Plan: To be determined  Consultants:   Dr Irene Limbo, Oncology  Procedures:   Blood transfusion   Antimicrobials:  -Unasyn    Subjective: Patient   Objective: Vitals:   08/30/18 0030 08/30/18 0150 08/30/18 0839 08/31/18 0946  BP:      Pulse:      Resp:      Temp: (!) 101.6 F (38.7 C) 99.3 F (37.4 C) 100.1 F (37.8 C)   TempSrc: Oral Oral Oral   SpO2:    (!) 84%  Weight:      Height:        Intake/Output Summary (Last 24 hours) at 09/01/2018 0850 Last data filed at 09/01/2018 0528 Gross per 24 hour  Intake 1262.41 ml  Output 350 ml  Net 912.41 ml   Filed Weights   08/27/18 1826  Weight: (!) 167 kg  Examination:  General exam: Patient anxious and in tears Respiratory system: Clear to auscultation. Respiratory effort normal. Cardiovascular system: S1 & S2 heard, RRR. No JVD, murmurs, rubs, gallops or clicks. No pedal edema. Gastrointestinal system: Abdomen is nondistended, soft and nontender. No organomegaly or masses felt. Normal bowel sounds heard. Central nervous system: Alert and oriented. No focal neurological deficits. Extremities: Symmetric 5 x 5 power. Skin:  No rashes, lesions or ulcers Psychiatry: Judgement and insight appear normal. Mood & affect appropriate.     Data Reviewed: I have personally reviewed following labs and imaging studies  CBC: Recent Labs  Lab 08/27/18 1557  08/28/18 0329  08/29/18 0343 08/29/18 1334 08/30/18 0344 08/31/18 0357 09/01/18 0413  WBC 12.3*  --  8.3  --  13.4*  --  11.8* 11.5* 9.7  NEUTROABS 9.8*  --  6.0  --  10.4*  --   --   --   --   HGB 8.4*   < > 7.4*   < > 9.8* 9.7* 9.0* 9.1* 8.0*  HCT 29.3*   < > 26.3*   < > 33.6* 32.8* 30.4* 31.0* 26.9*  MCV 96.7  --  98.9  --  97.4  --  96.5 97.8 98.5  PLT 278  --  234  --  244  --  228 228 241   < > = values in this interval not displayed.   Basic Metabolic Panel: Recent Labs  Lab 08/27/18 1557 08/29/18 0343 08/30/18 0344 08/31/18 0357 09/01/18 0413  NA 143 143 141 141 141  K 3.4* 3.9 3.7 3.7 3.4*  CL 111 109 108 106 105  CO2 25 25 26 27 27   GLUCOSE 131* 109* 100* 107* 95  BUN 10 10 12 11 13   CREATININE 0.92 1.01* 1.01* 0.99 0.91  CALCIUM 7.9* 8.5* 8.0* 8.2* 7.9*   GFR: Estimated Creatinine Clearance: 127.4 mL/min (by C-G formula based on SCr of 0.91 mg/dL). Liver Function Tests: Recent Labs  Lab 08/27/18 1557 08/29/18 0343 09/01/18 0413  AST 35 27 24  ALT 19 17 20   ALKPHOS 77 71 76  BILITOT 0.4 0.6 0.7  PROT 6.0* 6.6 6.1*  ALBUMIN 2.6* 2.8* 2.2*   No results for input(s): LIPASE, AMYLASE in the last 168 hours. No results for input(s): AMMONIA in the last 168 hours. Coagulation Profile: Recent Labs  Lab 08/27/18 0951 08/27/18 1557  INR 1.01 1.09   Cardiac Enzymes: No results for input(s): CKTOTAL, CKMB, CKMBINDEX, TROPONINI in the last 168 hours. BNP (last 3 results) No results for input(s): PROBNP in the last 8760 hours. HbA1C: No results for input(s): HGBA1C in the last 72 hours. CBG: Recent Labs  Lab 08/27/18 1516  GLUCAP 145*   Lipid Profile: No results for input(s): CHOL, HDL, LDLCALC, TRIG, CHOLHDL, LDLDIRECT in  the last 72 hours. Thyroid Function Tests: No results for input(s): TSH, T4TOTAL, FREET4, T3FREE, THYROIDAB in the last 72 hours. Anemia Panel: No results for input(s): VITAMINB12, FOLATE, FERRITIN, TIBC, IRON, RETICCTPCT in the last 72 hours. Sepsis Labs: Recent Labs  Lab 08/30/18 1432  LATICACIDVEN 1.4    Recent Results (from the past 240 hour(s))  Urine Culture     Status: None   Collection Time: 08/29/18  9:19 AM  Result Value Ref Range Status   Specimen Description   Final    URINE, CATHETERIZED Performed at [facility] [address], [place], [place] [address]    Special Requests   Final    NONE Performed  at [facility], [place] [address] [place], [place] [address]    Culture   Final    NO GROWTH Performed at [facility], [place] [address], [place], [place] [address]    Report Status 08/30/2018 FINAL  Final  Culture, blood (routine x 2)     Status: None (Preliminary result)   Collection Time: 08/30/18  2:32 PM  Result Value Ref Range Status   Specimen Description   Final    BLOOD LEFT HAND Performed at [facility] [address], [place], [place] [address]    Special Requests   Final    BOTTLES DRAWN AEROBIC ONLY Blood Culture adequate volume Performed at [facility] [address], [place], [place] [address]    Culture   Final    NO GROWTH 2 DAYS Performed at [facility] [address], [place], [place] [address]    Report Status PENDING  Incomplete  Culture, blood (routine x 2)     Status: None (Preliminary result)   Collection Time: 08/30/18  2:36 PM  Result Value Ref Range Status   Specimen Description   Final    RIGHT ANTECUBITAL Performed at [facility] [address] [place], [place] [address]    Special Requests   Final    BOTTLES DRAWN AEROBIC AND ANAEROBIC Blood Culture adequate volume Performed at [facility] [address], [place], [place] [address]    Culture   Final    NO GROWTH 2 DAYS Performed at [facility] [address] [place],  [address]    Report Status PENDING  Incomplete         Radiology Studies: No results found.      Scheduled Meds: . dicyclomine  20 mg Oral TID AC  . feeding supplement (ENSURE ENLIVE)  237 mL Oral BID BM  . guaiFENesin  600 mg Oral BID  . loratadine  10 mg Oral Daily  . mometasone-formoterol  2 puff Inhalation BID  . olopatadine  1 drop Both Eyes BID  . pantoprazole  40 mg Oral QAC breakfast  . saccharomyces boulardii  250 mg Oral BID  . vitamin B-12  1,000 mcg Oral Daily   Continuous Infusions: . sodium chloride 10 mL/hr at 08/29/18 1247  . ampicillin-sulbactam (UNASYN) IV 3 g (09/01/18 0723)     LOS: 3 days    Time spent: 77 minutes    Nashea Chumney,LAWAL, MD [facility] Pager [phone]  If 7PM-7AM, please contact night-coverage [email or web address] Password [email or web address] 09/01/2018, 8:50 AM

## 2018-09-01 NOTE — Progress Notes (Signed)
   09/01/18 1203  Clinical Encounter Type  Visited With Patient  Visit Type Initial  Referral From Nurse  Consult/Referral To Chaplain  Spiritual Encounters  Spiritual Needs Prayer  The chaplain responded to spiritual care consult for Pt. Prayer.  The RN-Cristy shared with the chaplain the Pt.'s recent liver cancer diagnosis and tearful presence. The chaplain was welcomed into the room by the Pt.  The Pt. Is confident God has a plan for her.  The Pt. connects her tears with God's presence.  The chaplain opened the door for future spiritual care visits with a hospital chaplain after praying with the Pt.

## 2018-09-02 ENCOUNTER — Telehealth: Payer: Self-pay | Admitting: *Deleted

## 2018-09-02 ENCOUNTER — Other Ambulatory Visit (HOSPITAL_COMMUNITY): Payer: Medicaid Other

## 2018-09-02 DIAGNOSIS — D62 Acute posthemorrhagic anemia: Secondary | ICD-10-CM

## 2018-09-02 DIAGNOSIS — J690 Pneumonitis due to inhalation of food and vomit: Secondary | ICD-10-CM

## 2018-09-02 DIAGNOSIS — C189 Malignant neoplasm of colon, unspecified: Secondary | ICD-10-CM

## 2018-09-02 DIAGNOSIS — C787 Secondary malignant neoplasm of liver and intrahepatic bile duct: Principal | ICD-10-CM

## 2018-09-02 LAB — CBC
HCT: 29.6 % — ABNORMAL LOW (ref 36.0–46.0)
Hemoglobin: 8.6 g/dL — ABNORMAL LOW (ref 12.0–15.0)
MCH: 28.7 pg (ref 26.0–34.0)
MCHC: 29.1 g/dL — ABNORMAL LOW (ref 30.0–36.0)
MCV: 98.7 fL (ref 80.0–100.0)
Platelets: 270 10*3/uL (ref 150–400)
RBC: 3 MIL/uL — ABNORMAL LOW (ref 3.87–5.11)
RDW: 16.6 % — AB (ref 11.5–15.5)
WBC: 9 10*3/uL (ref 4.0–10.5)
nRBC: 0 % (ref 0.0–0.2)

## 2018-09-02 MED ORDER — BOOST / RESOURCE BREEZE PO LIQD CUSTOM
1.0000 | ORAL | Status: DC
  Administered 2018-09-02 – 2018-09-03 (×2): 1 via ORAL

## 2018-09-02 MED ORDER — POTASSIUM CHLORIDE CRYS ER 20 MEQ PO TBCR
20.0000 meq | EXTENDED_RELEASE_TABLET | Freq: Every day | ORAL | Status: DC
  Administered 2018-09-02 – 2018-09-03 (×2): 20 meq via ORAL
  Filled 2018-09-02 (×2): qty 1

## 2018-09-02 NOTE — Telephone Encounter (Signed)
Patient in hospital - called to say she will not be here tomorrow for appointments. Appts cancelled. Will be rescheduled per Dr. Irene Limbo discharge orders. Rescheduling message sent

## 2018-09-02 NOTE — Progress Notes (Signed)
HEMATOLOGY/ONCOLOGY INPATIENT PROGRESS NOTE  Date of Service: 09/02/2018  Inpatient Attending: .Kinnie Feil, MD   SUBJECTIVE:   Margaret [name] reports that she is doing well overall.   The pt reports that she has been feeling SOB since her liver biopsy and she is currently on Oxygen. She notes that she is having some leg swelling and is anticipating an ECHO. She notes that her temperature has increased as high as 101 and has been receiving Tylenol as needed. She is also experiencing nausea, but denies abnormal bowel movements. She notes that some element of nausea could be related to anxiety.   The pt notes that she is not having abdominal pain any longer. She has a cough every now and then. She denies any blood in the stools or black stools. She is moving her bowels well.   Lab results today (09/02/18) of CBC is as follows: all values are WNL except for RBC at 3.00, HGB at 8.6, HCT at 29.6, MCHC at 29.1, RDW at 16.6.  On review of systems, pt reports SOB, some leg swelling, recent anxiety/nausea, moving her bowels well, and denies blood in the stools, black stools, abdominal pain, and any other symptoms.    OBJECTIVE:  NAD at rest  PHYSICAL EXAMINATION: . Vitals:   08/30/18 0839 08/31/18 0946 09/01/18 1203 09/01/18 1924  BP:      Pulse:      Resp:      Temp: 100.1 F (37.8 C)  98.3 F (36.8 C)   TempSrc: Oral  Oral   SpO2:  (!) 84%  98%  Weight:      Height:       Filed Weights   08/27/18 1826  Weight: (!) 368 lb 2.7 oz (167 kg)   .Body mass index is 52.83 kg/m.  GENERAL:alert, in no acute distress and comfortable SKIN: skin color, texture, turgor are normal, no rashes or significant lesions EYES: normal, conjunctiva are pink and non-injected, sclera clear OROPHARYNX:no exudate, no erythema and lips, buccal mucosa, and tongue normal  NECK: supple, no JVD, thyroid normal size, non-tender, without nodularity LYMPH:  no palpable lymphadenopathy in the  cervical, axillary or inguinal LUNGS: clear to auscultation with normal respiratory effort HEART: regular rate & rhythm,  no murmurs and no lower extremity edema ABDOMEN: abdomen soft, non-tender, normoactive bowel sounds  Musculoskeletal: 2+ pedal edema  PSYCH: alert & oriented x 3 with fluent speech NEURO: no focal motor/sensory deficits  MEDICAL HISTORY:  Past Medical History:  Diagnosis Date  . Allergic rhinitis   . Anxiety diagnosed in 1990  . Arthritis   . Asthma   . Dyspnea    with low blood count hx of anemia  . Eczema   . History of blood transfusion   . Hypertension   . Lower back pain   . Migraine   . Sleep apnea    uses CPAP    SURGICAL HISTORY: Past Surgical History:  Procedure Laterality Date  . ABDOMINAL HYSTERECTOMY    . cortisone injections     knees bilat and back   . iron infusion    . LIVER BIOPSY      SOCIAL HISTORY: Social History   Socioeconomic History  . Marital status: Single    Spouse name: Not on file  . Number of children: Not on file  . Years of education: Not on file  . Highest education level: Not on file  Occupational History  . Not on file  Social Needs  .  Financial resource strain: Not on file  . Food insecurity:    Worry: Not on file    Inability: Not on file  . Transportation needs:    Medical: Not on file    Non-medical: Not on file  Tobacco Use  . Smoking status: Former Smoker    Last attempt to quit: 07/30/1999    Years since quitting: 19.1  . Smokeless tobacco: Never Used  Substance and Sexual Activity  . Alcohol use: No  . Drug use: No  . Sexual activity: Not on file  Lifestyle  . Physical activity:    Days per week: Not on file    Minutes per session: Not on file  . Stress: Not on file  Relationships  . Social connections:    Talks on phone: Not on file    Gets together: Not on file    Attends religious service: Not on file    Active member of club or organization: Not on file    Attends meetings of  clubs or organizations: Not on file    Relationship status: Not on file  . Intimate partner violence:    Fear of current or ex partner: Not on file    Emotionally abused: Not on file    Physically abused: Not on file    Forced sexual activity: Not on file  Other Topics Concern  . Not on file  Social History Narrative  . Not on file    FAMILY HISTORY: History reviewed. No pertinent family history.  ALLERGIES:  is allergic to latex; tape; and tylenol with codeine #3 [acetaminophen-codeine].  MEDICATIONS:  Scheduled Meds: . dicyclomine  20 mg Oral TID AC  . feeding supplement  1 Container Oral Q24H  . guaiFENesin  600 mg Oral BID  . loratadine  10 mg Oral Daily  . mometasone-formoterol  2 puff Inhalation BID  . olopatadine  1 drop Both Eyes BID  . pantoprazole  40 mg Oral QAC breakfast  . potassium chloride  20 mEq Oral Daily  . saccharomyces boulardii  250 mg Oral BID  . vitamin B-12  1,000 mcg Oral Daily   Continuous Infusions: . sodium chloride 10 mL/hr at 08/29/18 1247  . ampicillin-sulbactam (UNASYN) IV Stopped (09/02/18 1348)   PRN Meds:.acetaminophen **OR** acetaminophen, albuterol, cyclobenzaprine, diazepam, diclofenac sodium, guaiFENesin, HYDROmorphone HCl, methocarbamol, ondansetron **OR** ondansetron (ZOFRAN) IV, sodium chloride flush, traMADol, triamcinolone, zolpidem  REVIEW OF SYSTEMS:    A 10+ POINT REVIEW OF SYSTEMS WAS OBTAINED including neurology, dermatology, psychiatry, cardiac, respiratory, lymph, extremities, GI, GU, Musculoskeletal, constitutional, breasts, reproductive, HEENT.  All pertinent positives are noted in the HPI.  All others are negative.    LABORATORY DATA:  I have reviewed the data as listed  . CBC Latest Ref Rng & Units 09/02/2018 09/01/2018 08/31/2018  WBC 4.0 - 10.5 K/uL 9.0 9.7 11.5(H)  Hemoglobin 12.0 - 15.0 g/dL 8.6(L) 8.0(L) 9.1(L)  Hematocrit 36.0 - 46.0 % 29.6(L) 26.9(L) 31.0(L)  Platelets 150 - 400 K/uL 270 241 228     . CMP Latest Ref Rng & Units 09/01/2018 08/31/2018 08/30/2018  Glucose 70 - 99 mg/dL 95 107(H) 100(H)  BUN 6 - 20 mg/dL 13 11 12   Creatinine 0.44 - 1.00 mg/dL 0.91 0.99 1.01(H)  Sodium 135 - 145 mmol/L 141 141 141  Potassium 3.5 - 5.1 mmol/L 3.4(L) 3.7 3.7  Chloride 98 - 111 mmol/L 105 106 108  CO2 22 - 32 mmol/L 27 27 26   Calcium 8.9 - 10.3 mg/dL 7.9(L) 8.2(L)  8.0(L)  Total Protein 6.5 - 8.1 g/dL 6.1(L) - -  Total Bilirubin 0.3 - 1.2 mg/dL 0.7 - -  Alkaline Phos 38 - 126 U/L 76 - -  AST 15 - 41 U/L 24 - -  ALT 0 - 44 U/L 20 - -     RADIOGRAPHIC STUDIES: I have personally reviewed the radiological images as listed and agreed with the findings in the report. Ct Abdomen Wo Contrast  Result Date: 08/27/2018 CLINICAL DATA:  Status post CT-guided biopsy of lesion in segment VII of the right lobe of the liver Faythe Dingwall today under CT guidance. Significant abdominal pain during recovery. EXAM: CT ABDOMEN WITHOUT CONTRAST TECHNIQUE: Multidetector CT imaging of the abdomen was performed following the standard protocol without IV contrast. COMPARISON:  Imaging during liver biopsy earlier today as well as prior CT of the abdomen on 04/16/2018. FINDINGS: Lower chest: Stable scarring in the right lower lung adjacent to the major fissure. Hepatobiliary: Acute subcapsular and perihepatic hemorrhage is present after biopsy, primarily inferior to the right lobe of the liver. Lateral subcapsular component of hemorrhage adjacent to the right lobe measures approximately 1.4-1.5 cm in greatest thickness. Most of the acute hemorrhage is inferior to the right lobe where predominately subcapsular component measures approximately 7 x 10 x 12 cm. There is some extension of blood inferiorly along the right pericolic gutter and into the pelvis. Mixed density is present in the hematoma just posterior to the right lobe inferiorly. Presence of active bleeding can not be determined without administration of IV contrast. No  evidence of biliary ductal dilatation. At least 1 gallstone is present in the gallbladder. Pancreas: Unremarkable. No pancreatic ductal dilatation or surrounding inflammatory changes. Spleen: Normal in size without focal abnormality. Adrenals/Urinary Tract: Adrenal glands are unremarkable. Kidneys are normal, without renal calculi, focal lesion, or hydronephrosis. Stomach/Bowel: Visualized bowel is unremarkable. No ileus, obstruction or inflammation. No free air. Vascular/Lymphatic: No enlarged lymph nodes identified. Other: There is a moderate hiatal hernia. Musculoskeletal: No acute or significant osseous findings. IMPRESSION: Acute hemorrhage after liver biopsy consisting of a thin lateral subcapsular hemorrhage adjacent to the right lobe of the liver and a larger infrahepatic hematoma which is largely subcapsular but with some inferior extension along the right pericolic gutter into the pelvis. Overall amount of hemorrhage is moderate, likely in the 400-450 mL range based on volume estimate. These results were called by telephone at the time of interpretation on 08/27/2018 at 4:15 pm to [facility], PA-C, who verbally acknowledged these results. Electronically Signed   By: Aletta Edouard M.D.   On: 08/27/2018 16:21   Ct Biopsy  Result Date: 08/27/2018 INDICATION: 47 year old female with multifocal hypermetabolic liver lesions and a probable colonic mass concerning for colon cancer metastatic to the liver. Prior ultrasound-guided biopsy to attempt was nondiagnostic. Patient presents for attempted CT-guided biopsy. EXAM: CT BIOPSY MEDICATIONS: None. ANESTHESIA/SEDATION: Moderate (conscious) sedation was employed during this procedure. A total of Versed 2 mg and Fentanyl 100 mcg was administered intravenously. Moderate Sedation Time: 13 minutes. The patient's level of consciousness and vital signs were monitored continuously by radiology nursing throughout the procedure under my direct supervision.  FLUOROSCOPY TIME:  None COMPLICATIONS: None immediate. PROCEDURE: Informed written consent was obtained from the patient after a thorough discussion of the procedural risks, benefits and alternatives. All questions were addressed. A timeout was performed prior to the initiation of the procedure. A planning axial CT scan was performed. Hypoattenuating liver lesion in hepatic segment 7 was successfully identified.  A suitable skin entry site was selected and marked. The region was sterilely prepped and draped in standard fashion using chlorhexidine skin prep. Local anesthesia was attained by infiltration with 1% lidocaine. A small dermatotomy was made. Under intermittent CT guidance, a 20 cm 17 gauge introducer needle was carefully advanced through the liver and positioned at the margin of the hypoechoic mass. Multiple 18 gauge core biopsies were then coaxially obtained using the bio Pince automated biopsy device. Biopsy specimens were placed in formalin and delivered to pathology for further analysis. As the introducer needle was removed, the biopsy tract was embolized with a Gel-Foam slurry. The patient tolerated the procedure well. IMPRESSION: Technically successful CT-guided core biopsy of hepatic lesion. Electronically Signed   By: Jacqulynn Cadet M.D.   On: 08/27/2018 12:41   Dg Abd Acute W/chest  Result Date: 08/29/2018 CLINICAL DATA:  Liver biopsy 2 days ago. Right upper quadrant abdominal pain. EXAM: DG ABDOMEN ACUTE W/ 1V CHEST COMPARISON:  CT 08/27/2018.  Chest radiographs 07/06/2018. FINDINGS: 1137 hours. Significantly lower lung volumes with new bibasilar airspace opacities, probably atelectasis. No pneumothorax or large pleural effusion identified. The heart size and mediastinal contours are stable for technique. No acute osseous findings evident. The bowel gas pattern is normal. There is no free intraperitoneal air. IMPRESSION: New bibasilar airspace opacities likely represent atelectasis given  the significantly lower lung volumes. Aspiration and inflammation could have this appearance. No acute abdominal findings identified. Electronically Signed   By: Richardean Sale M.D.   On: 08/29/2018 13:02   US Thyroid  Result Date: 08/20/2018 CLINICAL DATA:  Incidental on PET. EXAM: THYROID ULTRASOUND TECHNIQUE: Ultrasound examination of the thyroid gland and adjacent soft tissues was performed. COMPARISON:  None. FINDINGS: Parenchymal Echotexture: Moderately heterogenous Isthmus: 0.4 cm Right lobe: 6.6 x 3.9 x 4.4 cm Left lobe: 5.5 x 1.6 x 2.5 cm _________________________________________________________ Estimated total number of nodules >/= 1 cm: 4 Number of spongiform nodules >/=  2 cm not described below (TR1): 0 Number of mixed cystic and solid nodules >/= 1.5 cm not described below (TR2): 0 _________________________________________________________ Nodule # 1: Location: Right; Mid Maximum size: 5.6 cm; Other 2 dimensions: 3.6 x 3.4 cm Composition: mixed cystic and solid (1) Echogenicity: isoechoic (1) Shape: not taller-than-wide (0) Margins: smooth (0) Echogenic foci: none (0) ACR TI-RADS total points: 2. ACR TI-RADS risk category: TR2 (2 points). ACR TI-RADS recommendations: This nodule does NOT meet TI-RADS criteria for biopsy or dedicated follow-up. _________________________________________________________ Nodule # 4: Location: Left; Inferior Maximum size: 2.1 cm; Other 2 dimensions: 1.9 x 1.7 cm Composition: solid/almost completely solid (2) Echogenicity: hypoechoic (2) Shape: not taller-than-wide (0) Margins: ill-defined (0) Echogenic foci: macrocalcifications (1) ACR TI-RADS total points: 5. ACR TI-RADS risk category: TR4 (4-6 points). ACR TI-RADS recommendations: **Given size (>/= 1.5 cm) and appearance, fine needle aspiration of this moderately suspicious nodule should be considered based on TI-RADS criteria. _________________________________________________________ Left nodules 2 and 3 are  isoechoic and do not meet criteria for biopsy nor follow-up. IMPRESSION: Large complex solid and cystic nodule in the right lobe does not meet criteria for biopsy nor follow-up. Left nodule 4 meets criteria for fine needle aspiration biopsy. The above is in keeping with the ACR TI-RADS recommendations - J Am Coll Radiol 2017;14:587-595. Electronically Signed   By: Marybelle Killings M.D.   On: 08/20/2018 14:32   Ct Angio Abd/pel W/ And/or W/o  Result Date: 08/27/2018 CLINICAL DATA:  47 year old female with perihepatic hemorrhage status post CT-guided liver lesion biopsy.  Evaluate for pseudoaneurysm or persistent bleeding. EXAM: CT ANGIOGRAPHY ABDOMEN AND PELVIS WITH CONTRAST AND WITHOUT CONTRAST TECHNIQUE: Multidetector CT imaging of the abdomen and pelvis was performed using the standard protocol during bolus administration of intravenous contrast. Multiplanar reconstructed images and MIPs were obtained and reviewed to evaluate the vascular anatomy. CONTRAST:  198mL ISOVUE-370 IOPAMIDOL (ISOVUE-370) INJECTION 76% COMPARISON:  None. FINDINGS: VASCULAR Aorta: Normal caliber aorta without aneurysm, dissection, vasculitis or significant stenosis. Celiac: Patent without evidence of aneurysm, dissection, vasculitis or significant stenosis. SMA: Patent without evidence of aneurysm, dissection, vasculitis or significant stenosis. Renals: Both renal arteries are patent without evidence of aneurysm, dissection, vasculitis, fibromuscular dysplasia or significant stenosis. IMA: Patent without evidence of aneurysm, dissection, vasculitis or significant stenosis. Inflow: Patent without evidence of aneurysm, dissection, vasculitis or significant stenosis. Proximal Outflow: Bilateral common femoral and visualized portions of the superficial and profunda femoral arteries are patent without evidence of aneurysm, dissection, vasculitis or significant stenosis. Veins: No obvious venous abnormality within the limitations of this  arterial phase study. Review of the MIP images confirms the above findings. NON-VASCULAR Lower chest: No acute abnormality. Hepatobiliary: Multiple hypoechoic hepatic lesions consistent with known/suspected multifocal hepatic metastatic disease. There is a moderate subcapsular hematoma arising from the posteromedial aspect of hepatic segment 6. The volume of the collection has not substantially changed compared to the noncontrast imaging done earlier today. The volume of hemorrhage appears stable. No evidence of active extravasation on arterial or venous phase imaging. Cholelithiasis noted incidentally. Pancreas: Unremarkable. No pancreatic ductal dilatation or surrounding inflammatory changes. Spleen: Normal in size without focal abnormality. Adrenals/Urinary Tract: Adrenal glands are unremarkable. Kidneys are normal, without renal calculi, focal lesion, or hydronephrosis. Bladder is unremarkable. Stomach/Bowel: Diffuse cecal thickening again noted consistent with patient's suspected colon malignancy. No evidence of obstruction. Lymphatic: No suspicious lymphadenopathy. Reproductive: Surgical changes of prior hysterectomy. Other: Small volume hemoperitoneum tracking along the right pericolic gutter and into the pelvic recesses. Musculoskeletal: No acute osseous abnormality. IMPRESSION: VASCULAR 1. No evidence of arterial pseudoaneurysm or active bleeding. NON-VASCULAR 1. Stable subcapsular, perihepatic and intraperitoneal hemorrhage without evidence of continued bleeding. Electronically Signed   By: Jacqulynn Cadet M.D.   On: 08/27/2018 18:27    ASSESSMENT & PLAN:  47 y.o. female with  1. Iron Deficiency Anemia - Likely from GI bleeding related to newly diagnosed colon cancer.  2. B12 deficiency Labs upon initial presentation from 04/16/18, HGB at 9.5. The 04/09/18 Ferritin was low at 6 and Vitamin B12 was at 196 -No antiparietal antibodies, no intrinsic factor antibodies, normal TSH all from  04/28/18  PLAN: -Continue NSAID avoidance  -continue 1075mcg Vitamin B12 sublingually daily - B12 improved from 196 to 584  3. Metastatic Liver lesions and pancreatic lesions  04/16/18 CT A/P with pt revealed No acute findings in the abdomen/pelvis.  Least 4 low-density hepatic lesions, some of which may represent small cysts or hemangiomas, however there is a 19 mm lesion in the left lobe of the liver that is incompletely characterized. Recommend nonemergent hepatic protocol MRI for further evaluation.Marland Kitchen Possible 11 mm pancreatic nodule rising from the tail, alternatively this may represent a splenule. This can also be assessed at time of hepatic MRI. Gallstone without gallbladder inflammation. Colonic diverticulosis without diverticulitis.   07/22/18 Liver biopsy results revealed benign hepatic tissue, and was not diagnostic   07/16/18 PET/CT revealed There is a solid hypermetabolic mass involving the cecum, worrisome for colonic primary neoplasm. Correlation with colonoscopy Findings. 2. Multiple hypermetabolic liver lesions compatible with  metastatic disease. 3. Large low-density lesion in right lobe of thyroid gland. Advise further evaluation with thyroid sonography. 4. Aortic Atherosclerosis. 5. Small hiatal hernia.   PLAN: -Discussed pt labwork today, 09/02/18; HGB slightly improving to 8.6, WBC normal at 9.0k, PLT at 270k -Continue eating well, staying hydrated -Proceed with ECHO and evaluations of new SOB -Could be fluid overloaded and may need diuresis  -IV Injectafer inpatient vs outpatient to maintenance ferritin>100 -Discussed the 08/27/18 Liver biopsy which revealed Metastatic adenocarcinoma, consistent with gastrointestinal primary  -Discussed that the Staging is Stage IV-colon cancer -Discussed the diagnosis, natural history, and options of treatment with FOLFOX -Have sent genetic testing to evaluate for targeted and immunotherapy options , which is pending  -Will refer the pt  to IR for port placement -Will set the pt up for chemotherapy counseling -07/09/18 Tumor marker work up: CEA normal at 1.32, AFP normal at 1.5, LDH normal at 138, CA 125 normal at 5.9, CA 19-9 normal at <2 -Missed the 08/31/18 Colonoscopy with Dr. Alessandra Bevels for evaluation of PET/CT findings and tissue sample, due to admission in hospital  -Recommend saline sprays OTC to prevent further epistaxis  -Recommend soft foods that are easy to digest   The total time spent in the appt was 30 minutes and more than 50% was on counseling and direct patient cares.    Sullivan Lone MD MS AAHIVMS [facility] Hematology/Oncology Physician [facility]  (Office):       [phone] (Work cell):  [phone] (Fax):           [phone]  09/02/2018 5:01 PM   I, Baldwin [place], am acting as a scribe for Dr. Sullivan Lone.   .I have reviewed the above documentation for accuracy and completeness, and I agree with the above. Sullivan Lone MD MS

## 2018-09-02 NOTE — Progress Notes (Signed)
Referring Physician(s): Avbuere,Edwin  Supervising Physician: Daryll Brod  Patient Status:  [facility] - In-pt  Chief Complaint: "Trouble breathing" and "Ankle swelling"  Subjective:  Subcapsular liver hematoma s/p liver biopsy 08/27/2018 by Dr. Laurence Ferrari. Patient awake and alert sitting in chair. Complains of dyspnea. States it has improved since she has been on O2. Complains of lower extremity edema. States they are planning to do ultrasound to R/O DVT. Denies abdominal pain.   Allergies: Latex; Tape; and Tylenol with codeine #3 [acetaminophen-codeine]  Medications: Prior to Admission medications   Medication Sig Start Date End Date Taking? Authorizing Provider  albuterol (PROAIR HFA) 108 (90 Base) MCG/ACT inhaler INHALE TWO puffs BY MOUTH into the lungs EVERY 4 HOURS AS NEEDED FOR WHEEZING OR shortness of breath 08/19/18  Yes Valentina Shaggy, MD  Boric Acid 4 % SOLN Place 1 capsule vaginally every Friday.    Yes [provider]  cetirizine (ZYRTEC) 10 MG tablet Take 1 tablet (10 mg total) by mouth daily. 07/06/18  Yes Valentina Shaggy, MD  clobetasol ointment (TEMOVATE) 6.94 % Apply 1 application topically 2 (two) times daily. Use for Lichen Sclerosis   Yes Bovard-Stuckert, Jody, MD  Cyanocobalamin (B-12) 1000 MCG SUBL Place 1,000 mcg under the tongue daily. 04/28/18  Yes Brunetta Genera, MD  diazepam (VALIUM) 5 MG tablet Take 5 mg by mouth daily as needed for anxiety (panic attacks).    Yes Nolene Ebbs, MD  diclofenac sodium (VOLTAREN) 1 % GEL Apply 2 g topically 4 (four) times daily as needed (for back/knee pain.).    Yes [provider]  dicyclomine (BENTYL) 20 MG tablet Take 1 tablet (20 mg total) by mouth 2 (two) times daily. Patient taking differently: Take 20 mg by mouth 3 (three) times daily before meals.  04/16/18  Yes Ocie Cornfield T, PA-C  ergocalciferol (VITAMIN D2) 50000 UNITS capsule Take 50,000 Units by mouth every Monday.     Yes [provider]  lisinopril-hydrochlorothiazide (PRINZIDE,ZESTORETIC) 20-12.5 MG tablet Take 1 tablet by mouth daily.   Yes [provider]  montelukast (SINGULAIR) 10 MG tablet Take 1 tablet (10 mg total) by mouth at bedtime. 07/06/18  Yes Valentina Shaggy, MD  nystatin (NYSTATIN) powder Apply 1 g topically 2 (two) times daily as needed (irritation). Use in skin folds and under breast    Yes Bovard-Stuckert, Jody, MD  nystatin-triamcinolone (MYCOLOG II) cream Apply 1 application topically 2 (two) times daily as needed (irritation). Use in skin folds and under breast    Yes Bovard-Stuckert, Jody, MD  pantoprazole (PROTONIX) 40 MG tablet Take 40 mg by mouth daily before breakfast.  06/24/18  Yes [provider]  pimecrolimus (ELIDEL) 1 % cream Apply 1 application topically 2 (two) times daily. 07/06/18  Yes Valentina Shaggy, MD  SYMBICORT 160-4.5 MCG/ACT inhaler Inhale 2 puffs into the lungs 2 (two) times daily. Patient taking differently: Inhale 2 puffs into the lungs 2 (two) times daily.  08/05/18  Yes Valentina Shaggy, MD  tiZANidine (ZANAFLEX) 4 MG tablet Take 4 mg by mouth every 8 (eight) hours as needed for spasms.   Yes [provider]  traMADol (ULTRAM) 50 MG tablet Take 50 mg by mouth every 6 (six) hours as needed for moderate pain (back pain.).    Yes Nolene Ebbs, MD  zolpidem (AMBIEN) 5 MG tablet Take 5 mg by mouth at bedtime as needed for sleep.   Yes [provider]  cyclobenzaprine (FLEXERIL) 10 MG tablet Take  10 mg by mouth 3 (three) times daily as needed (muscle spasms.).     [provider]  methocarbamol (ROBAXIN) 750 MG tablet Take 750 mg by mouth every 8 (eight) hours as needed (back spasms.).     [provider]  PATADAY 0.2 % SOLN Apply 2 drops to eye 2 (two) times daily as needed (for irritated eyes.). 07/30/18   Valentina Shaggy, MD  triamcinolone (NASACORT) 55 MCG/ACT AERO nasal inhaler  Place 2 sprays into the nose daily as needed (for allergies). 07/06/18   Valentina Shaggy, MD  valACYclovir (VALTREX) 500 MG tablet Take 500 mg by mouth 2 (two) times daily as needed (for cold sores).     [provider]     Vital Signs: BP 128/83 (BP Location: Right Arm)   Pulse 98   Temp 98.3 F (36.8 C) (Oral)   Resp 18   Ht 5\' 10"  (1.778 m)   Wt (!) 368 lb 2.7 oz (167 kg)   SpO2 98%   BMI 52.83 kg/m   Physical Exam Vitals signs and nursing note reviewed.  Constitutional:      General: She is not in acute distress.    Appearance: Normal appearance.  Pulmonary:     Effort: Pulmonary effort is normal. No respiratory distress.  Abdominal:     Palpations: Abdomen is soft.     Tenderness: There is no abdominal tenderness.  Skin:    General: Skin is warm and dry.  Neurological:     Mental Status: She is alert and oriented to person, place, and time.  Psychiatric:        Mood and Affect: Mood normal.        Behavior: Behavior normal.        Thought Content: Thought content normal.        Judgment: Judgment normal.     Imaging: No results found.  Labs:  CBC: Recent Labs    08/30/18 0344 08/31/18 0357 09/01/18 0413 09/02/18 1039  WBC 11.8* 11.5* 9.7 9.0  HGB 9.0* 9.1* 8.0* 8.6*  HCT 30.4* 31.0* 26.9* 29.6*  PLT 228 228 241 270    COAGS: Recent Labs    07/22/18 1139 08/27/18 0951 08/27/18 1557  INR 1.06 1.01 1.09  APTT 39* 38*  --     BMP: Recent Labs    08/29/18 0343 08/30/18 0344 08/31/18 0357 09/01/18 0413  NA 143 141 141 141  K 3.9 3.7 3.7 3.4*  CL 109 108 106 105  CO2 25 26 27 27   GLUCOSE 109* 100* 107* 95  BUN 10 12 11 13   CALCIUM 8.5* 8.0* 8.2* 7.9*  CREATININE 1.01* 1.01* 0.99 0.91  GFRNONAA >60 >60 >60 >60  GFRAA >60 >60 >60 >60    LIVER FUNCTION TESTS: Recent Labs    08/19/18 1404 08/27/18 1557 08/29/18 0343 09/01/18 0413  BILITOT 0.2* 0.4 0.6 0.7  AST 11* 35 27 24  ALT 7 19 17 20   ALKPHOS 91 77 71 76    PROT 7.5 6.0* 6.6 6.1*  ALBUMIN 3.0* 2.6* 2.8* 2.2*    Assessment and Plan:  Subcapsular liver hematoma s/p liver biopsy 08/27/2018 by Dr. Laurence Ferrari. Patient stable, denies abdominal pain/tenderness, hgb up to 8.6 from 8.0. Appreciate and agree with [facility] management. IR to follow.   Electronically Signed: Earley Abide, PA-C 09/02/2018, 2:05 PM   I spent a total of 25 Minutes at the the patient's bedside AND on the patient's hospital floor or unit, greater than 50%  of which was counseling/coordinating care for subcapsular liver hematoma s/p liver biopsy.

## 2018-09-02 NOTE — Progress Notes (Signed)
Oncology consult recevied. Patient will be seen this AM . .Sullivan Lone MD MS

## 2018-09-02 NOTE — Progress Notes (Signed)
Nutrition Follow-up  DOCUMENTATION CODES:   Morbid obesity  INTERVENTION:    Try Boost Breeze po once daily, each supplement provides 250 kcal and 9 grams of protein  D/C Ensure Enlive po BID, each supplement provides 350 kcal and 20 grams of protein  Provide snacks daily  NUTRITION DIAGNOSIS:   Increased nutrient needs related to chronic illness(Pancreatic and liver lesions ) as evidenced by estimated needs.  Ongoing  GOAL:   Patient will meet greater than or equal to 90% of their needs  Progressing  MONITOR:   PO intake, Supplement acceptance, Labs, Weight trends, I & O's  REASON FOR ASSESSMENT:   Malnutrition Screening Tool    ASSESSMENT:   47 y.o. female, with morbid obesity, asthma, iron  and B12 deficiency, who was evaluated by oncology in August this year for right-sided abdominal pain and symptomatic anemia.  Admitted for observation following liver biopsy.   Pt found to have metastatic adenocarcinoma of the liver with presumed GI to be primary.   Pt tearful upon RD visit. States her appetite is progressing slowly. Meal completions charted as 0-100% for pt's last eight meals (5 meals out of 8 charted as 0%). Suspect family is bringing pt food as well. She consumed an entire chef salad yesterday and attempted to drink Ensure. After the Ensure she had an episode of nausea with a small amount of resulting vomit. Pt does not wish to have Ensure after this (thinks vomit is due to her lactose intolerance). RD provided snack options and listed different supplements pt could try.   A recent wt has not been obtained. Recommend checking weekly weight to monitor trends.   Medications reviewed and include: Vit B12 Labs reviewed: K 3.4 (L)   Diet Order:   Diet Order            Diet regular Room service appropriate? Yes; Fluid consistency: Thin  Diet effective now              EDUCATION NEEDS:   No education needs have been identified at this time  Skin:  Skin  Assessment: Reviewed RN Assessment  Last BM:  09/01/18  Height:   Ht Readings from Last 1 Encounters:  08/27/18 5\' 10"  (1.778 m)    Weight:   Wt Readings from Last 1 Encounters:  08/27/18 (!) 167 kg    Ideal Body Weight:  68.2 kg  BMI:  Body mass index is 52.83 kg/m.  Estimated Nutritional Needs:   Kcal:  2200-2400  Protein:  90-100g  Fluid:  2L/day   Mariana Single RD, LDN Clinical Nutrition Pager # - [phone]

## 2018-09-02 NOTE — Progress Notes (Signed)
[facility] PROGRESS NOTE  Margaret [name] VEH:[id number] DOB: 06-30-1971 DOA: 08/27/2018 PCP: Nolene Ebbs, MD  Brief summary   47 y.o.female,with morbid obesity, asthma, iron and B12 deficiency, who was evaluated by oncology in August this year for right-sided abdominal pain and symptomatic anemia. She underwent CT of the abdomen and pelvis showing liver and pancreatic cyst with a 19 mm lesion in the left lobe of liver and recommended for outpatient MRI. A hepatic protocol MRI was done showing 11 mm pancreatic nodule arising from the tail and multiple hypervascular liver masses suspicious for liver metastasis. Patient underwent PET-CT scan showing hypermetabolic mass involving the cecum concerning for colonic primary neoplasm. This was followed by liver biopsy 1 month back which showed benign hepatic tissue. Tumor markers were normal. Patient scheduled for repeat liver biopsy by IR. Biopsy was done successfully however following procedure patient complained of right upper quadrant pain and when she got up to ambulate she was dizzy and felt like she was going to pass out. A follow-up CT of the abdomen postprocedure showed acute hemorrhage with a thin lateral subcapsular hemorrhage adjacent to the right lobe of liver and a larger infrahepatic hematoma (subcapsular) but with inferior extension along the right pericolic gutter into the pelvis. Reported overall amount of hemorrhage to be moderate all 400-450 mL. Patient's vitals were stable. Started on IV fluids and hospitalist consulted for observation overnight. Patient did have a drop in her hemoglobin. Hospitalization is complicated with fevers, aspiration pneumonia   Assessment/Plan:  Subcapsular liver hematoma along with perihepatic and intraperitoneal bleeding, post liver biopsy. Acute blood loss anemia. CT angiogram abdomen did not show any active bleeding.   Tfsed 2 unit. Today Hg is trending 9.1->8.0. will recheck Tf as  needed. No s/s of acute bleeding at this time.  Fever, aspiration pneumonia/acute respiratory failure with hypoxia. Started on iv antibiotic treatment. Blood culture: NGTD. Continue oxygen, wean as able. Deescalate iv antibiotic in AM  Dizziness with near syncope. Most likely due to acute blood loss.  No further episodes.   Metastatic adenocarcinoma. Pancreatic and liver lesions. Cecal lesion was noted on PET scan. Awaiting oncology eval. Postponed colonoscopy due to acute hypoxia, blood loss anemia, unable to bowel prep at this time. Needs colonoscopy in next 1-2 weeks.   History of asthma Stable.  Dyspnea on exertion, leg edema. ? Related to OSA. Will obtain echo.    Chronic back pain and muscle spasm. Continue home medications.  Code Status: full Family Communication: d/w patient (indicate person spoken with, relationship, and if by phone, the number) Disposition Plan: possible 24-48 hrs. Home    Consultants:  Oncology   Procedures:  Blood TF.   Antibiotics: Anti-infectives (From admission, onward)   Start     Dose/Rate Route Frequency Ordered Stop   08/29/18 1400  Ampicillin-Sulbactam (UNASYN) 3 g in sodium chloride 0.9 % 100 mL IVPB     3 g 200 mL/hr over 30 Minutes Intravenous Every 6 hours 08/29/18 1339          (indicate start date, and stop date if known)  HPI/Subjective: Reports dyspnea on exertion, chronic leg edema. Remains on low flow oxygen. No acute chest pains. Awaiting oncology eval   Objective: Vitals:   09/01/18 1203 09/01/18 1924  BP:    Pulse:    Resp:    Temp: 98.3 F (36.8 C)   SpO2:  98%    Intake/Output Summary (Last 24 hours) at 09/02/2018 1029 Last data filed at 09/02/2018 1000 Gross  per 24 hour  Intake 1640 ml  Output 800 ml  Net 840 ml   Filed Weights   08/27/18 1826  Weight: (!) 167 kg    Exam:   General:  No acute distress.   Cardiovascular: s1,s2 rrr  Respiratory: LL diminished BL  Abdomen: soft, obese,  nt   Musculoskeletal: mild edema    Data Reviewed: Basic Metabolic Panel: Recent Labs  Lab 08/27/18 1557 08/29/18 0343 08/30/18 0344 08/31/18 0357 09/01/18 0413  NA 143 143 141 141 141  K 3.4* 3.9 3.7 3.7 3.4*  CL 111 109 108 106 105  CO2 25 25 26 27 27   GLUCOSE 131* 109* 100* 107* 95  BUN 10 10 12 11 13   CREATININE 0.92 1.01* 1.01* 0.99 0.91  CALCIUM 7.9* 8.5* 8.0* 8.2* 7.9*   Liver Function Tests: Recent Labs  Lab 08/27/18 1557 08/29/18 0343 09/01/18 0413  AST 35 27 24  ALT 19 17 20   ALKPHOS 77 71 76  BILITOT 0.4 0.6 0.7  PROT 6.0* 6.6 6.1*  ALBUMIN 2.6* 2.8* 2.2*   No results for input(s): LIPASE, AMYLASE in the last 168 hours. No results for input(s): AMMONIA in the last 168 hours. CBC: Recent Labs  Lab 08/27/18 1557  08/28/18 0329  08/29/18 0343 08/29/18 1334 08/30/18 0344 08/31/18 0357 09/01/18 0413  WBC 12.3*  --  8.3  --  13.4*  --  11.8* 11.5* 9.7  NEUTROABS 9.8*  --  6.0  --  10.4*  --   --   --   --   HGB 8.4*   < > 7.4*   < > 9.8* 9.7* 9.0* 9.1* 8.0*  HCT 29.3*   < > 26.3*   < > 33.6* 32.8* 30.4* 31.0* 26.9*  MCV 96.7  --  98.9  --  97.4  --  96.5 97.8 98.5  PLT 278  --  234  --  244  --  228 228 241   < > = values in this interval not displayed.   Cardiac Enzymes: No results for input(s): CKTOTAL, CKMB, CKMBINDEX, TROPONINI in the last 168 hours. BNP (last 3 results) No results for input(s): BNP in the last 8760 hours.  ProBNP (last 3 results) No results for input(s): PROBNP in the last 8760 hours.  CBG: Recent Labs  Lab 08/27/18 1516  GLUCAP 145*    Recent Results (from the past 240 hour(s))  Urine Culture     Status: None   Collection Time: 08/29/18  9:19 AM  Result Value Ref Range Status   Specimen Description   Final    URINE, CATHETERIZED Performed at [facility] [address], [place], [place] [address]    Special Requests   Final    NONE Performed at [facility], [place]  [address], [place], [place] [address]    Culture   Final    NO GROWTH Performed at [facility], [place] [address], [place], [place] [address]    Report Status 08/30/2018 FINAL  Final  Culture, blood (routine x 2)     Status: None (Preliminary result)   Collection Time: 08/30/18  2:32 PM  Result Value Ref Range Status   Specimen Description   Final    BLOOD LEFT HAND Performed at [facility] [address] [place],  [address]    Special Requests   Final    BOTTLES DRAWN AEROBIC ONLY Blood Culture adequate volume Performed at [facility],  [place] [address] [place], [place] [address]    Culture   Final    NO GROWTH 3 DAYS Performed at [facility], [place] [address], [place], [place] [address]    Report Status PENDING  Incomplete  Culture, blood (routine x 2)     Status: None (Preliminary result)   Collection Time: 08/30/18  2:36 PM  Result Value Ref Range Status   Specimen Description   Final    RIGHT ANTECUBITAL Performed at [facility] [address] [place], [place] [address]    Special Requests   Final    BOTTLES DRAWN AEROBIC AND ANAEROBIC Blood Culture adequate volume Performed at [facility] [address] [place], [place] [address]    Culture   Final    NO GROWTH 3 DAYS Performed at [facility], [place] [address] [place], [place] [address]    Report Status PENDING  Incomplete     Studies: No results found.  Scheduled Meds: . dicyclomine  20 mg Oral TID AC  . feeding supplement (ENSURE ENLIVE)  237 mL Oral BID BM  . guaiFENesin  600 mg Oral BID  . loratadine  10 mg Oral Daily  . mometasone-formoterol  2 puff Inhalation BID  . olopatadine  1 drop Both Eyes BID  . pantoprazole  40 mg Oral QAC breakfast  . saccharomyces boulardii  250 mg Oral BID  . vitamin B-12  1,000 mcg Oral Daily   Continuous Infusions: . sodium chloride 10 mL/hr at 08/29/18 1247  .  ampicillin-sulbactam (UNASYN) IV Stopped (09/02/18 0844)    Principal Problem:   Acute blood loss anemia Active Problems:   Subcapsular hematoma of liver   Metastatic carcinoma to liver (HCC)   Aspiration pneumonia ([place])   Hypokalemia    Time spent: >35 minutes     Kinnie Feil  [facility] Pager [phone]. If 7PM-7AM, please contact night-coverage at [email or web address], password [facility] 09/02/2018, 10:29 AM  LOS: 4 days

## 2018-09-03 ENCOUNTER — Inpatient Hospital Stay: Payer: Medicaid Other | Admitting: Hematology

## 2018-09-03 ENCOUNTER — Ambulatory Visit: Payer: Medicaid Other

## 2018-09-03 ENCOUNTER — Telehealth: Payer: Self-pay | Admitting: *Deleted

## 2018-09-03 ENCOUNTER — Inpatient Hospital Stay: Payer: Medicaid Other

## 2018-09-03 ENCOUNTER — Encounter (HOSPITAL_COMMUNITY): Payer: Medicaid Other

## 2018-09-03 ENCOUNTER — Inpatient Hospital Stay (HOSPITAL_COMMUNITY): Payer: Medicaid Other

## 2018-09-03 DIAGNOSIS — I361 Nonrheumatic tricuspid (valve) insufficiency: Secondary | ICD-10-CM

## 2018-09-03 LAB — CBC
HCT: 32.4 % — ABNORMAL LOW (ref 36.0–46.0)
Hemoglobin: 9.4 g/dL — ABNORMAL LOW (ref 12.0–15.0)
MCH: 28.2 pg (ref 26.0–34.0)
MCHC: 29 g/dL — AB (ref 30.0–36.0)
MCV: 97.3 fL (ref 80.0–100.0)
Platelets: 337 10*3/uL (ref 150–400)
RBC: 3.33 MIL/uL — ABNORMAL LOW (ref 3.87–5.11)
RDW: 16.3 % — ABNORMAL HIGH (ref 11.5–15.5)
WBC: 10.9 10*3/uL — ABNORMAL HIGH (ref 4.0–10.5)
nRBC: 0 % (ref 0.0–0.2)

## 2018-09-03 LAB — ECHOCARDIOGRAM COMPLETE
Height: 70 in
Weight: 5890.69 oz

## 2018-09-03 MED ORDER — CYANOCOBALAMIN 1000 MCG PO TABS: 1000.0000 ug | ORAL_TABLET | Freq: Every day | ORAL | Status: DC

## 2018-09-03 MED ORDER — SACCHAROMYCES BOULARDII 250 MG PO CAPS: 250.0000 mg | ORAL_CAPSULE | Freq: Two times a day (BID) | ORAL | Status: AC

## 2018-09-03 MED ORDER — HYDROMORPHONE HCL 1 MG/ML PO LIQD: 0.5000 mg | Freq: Four times a day (QID) | ORAL | Status: DC | PRN

## 2018-09-03 MED ORDER — AMOXICILLIN-POT CLAVULANATE 875-125 MG PO TABS: 1.0000 | ORAL_TABLET | Freq: Two times a day (BID) | ORAL | Status: DC

## 2018-09-03 MED ORDER — AMOXICILLIN-POT CLAVULANATE 875-125 MG PO TABS: 1.0000 | ORAL_TABLET | Freq: Two times a day (BID) | ORAL | 0 refills | Status: DC

## 2018-09-03 MED ORDER — TRAMADOL HCL 50 MG PO TABS: 50.0000 mg | ORAL_TABLET | Freq: Four times a day (QID) | ORAL | Status: DC | PRN

## 2018-09-03 NOTE — Progress Notes (Signed)
  Echocardiogram 2D Echocardiogram has been performed.  Madelaine Etienne 09/03/2018, 9:27 AM

## 2018-09-03 NOTE — Care Management Note (Signed)
Case Management Note  Patient Details  Name: Margaret [name] MRN: [id number] Date of Birth: [birth year]  Subjective/Objective:  D/c home. No orders or CM needs.                  Action/Plan:dc home.   Expected Discharge Date:  09/03/18               Expected Discharge Plan:  Home/Self Care  In-House Referral:     Discharge planning Services  CM Consult  Post Acute Care Choice:    Choice offered to:     DME Arranged:    DME Agency:     HH Arranged:    HH Agency:     Status of Service:  Completed, signed off  If discussed at [employer] of Stay Meetings, dates discussed:    Additional Comments:  Dessa Phi, RN 09/03/2018, 11:59 AM

## 2018-09-03 NOTE — Telephone Encounter (Signed)
Dr. Irene Limbo asked if patient could come at 2:40 on Monday (with labs at 2:15)? Another patient cancelled and Dr. Irene Limbo wants to see her. Contacted patient, she will accept appointment. Contacted Allie M to facilitate transportation for patient.

## 2018-09-03 NOTE — Discharge Summary (Signed)
Physician Discharge Summary  Margaret [name] XVQ:[id number] DOB: 1970-10-12 DOA: 08/27/2018  PCP: Nolene Ebbs, MD  Admit date: 08/27/2018 Discharge date: 09/03/2018  Admitted From home Disposition: home Recommendations for Outpatient Follow-up:  1. Follow up with PCP in 1-2 weeks 2. Please obtain BMP/CBC in one week 3. Follow-up with Dr. Zacarias Pontes none Equipment/Devices: None Discharge Condition stable CODE STATUS full code Diet recommendation: Cardiac Brief/Interim Summary:47 y.o.female,with morbid obesity, asthma, iron and B12 deficiency, who was evaluated by oncology in August this year for right-sided abdominal pain and symptomatic anemia. She underwent CT of the abdomen and pelvis showing liver and pancreatic cyst with a 19 mm lesion in the left lobe of liver and recommended for outpatient MRI. A hepatic protocol MRI was done showing 11 mm pancreatic nodule arising from the tail and multiple hypervascular liver masses suspicious for liver metastasis. Patient underwent PET-CT scan showing hypermetabolic mass involving the cecum concerning for colonic primary neoplasm. This was followed by liver biopsy 1 month back which showed benign hepatic tissue. Tumor markers were normal. Patient scheduled for repeat liver biopsy by IR. Biopsy was done successfully however following procedure patient complained of right upper quadrant pain and when she got up to ambulate she was dizzy and felt like she was going to pass out. A follow-up CT of the abdomen postprocedure showed acute hemorrhage with a thin lateral subcapsular hemorrhage adjacent to the right lobe of liver and a larger infrahepatic hematoma (subcapsular) but with inferior extension along the right pericolic gutter into the pelvis. Reported overall amount of hemorrhage to be moderate all 400-450 mL. Patient's vitals were stable. Started on IV fluids and hospitalist consulted for observation overnight. Patient did have  a drop in her hemoglobin. Hospitalization is complicated with fevers, aspiration pneumonia   09/03/2018 patient seen and examined she is denies any new complaints anxious to go home breathing better she is on room air saturation above 95%.  Denies any chest pain. Discharge Diagnoses:  Principal Problem:   Acute blood loss anemia Active Problems:   Subcapsular hematoma of liver   Metastatic carcinoma to liver (HCC)   Aspiration pneumonia (HCC)   Hypokalemia  Subcapsular liver hematoma along with perihepatic and intraperitoneal bleeding, post liver biopsy. Acute blood loss anemia. CT angiogram abdomen did not show any active bleeding.  Tfsed 2 unit.  Hemoglobin on the day of discharge 9.4. No s/s of acute bleeding at this time.  Fever, aspiration pneumonia/acute respiratory failure with hypoxia.  Treated with IV Unasyn changed to Augmentin on discharge.   Dizziness with near syncope. Most likely due to acute blood loss.No further episodes.   Metastatic adenocarcinoma. Pancreatic and liver lesions. Cecal lesion was noted on PET scan. Awaiting oncology eval. Postponed colonoscopy due to acute hypoxia, blood loss anemia, unable to bowel prep at this time. Needs colonoscopy in next 1-2 weeks.  Follow-up with GI.  History of asthma Stable.  Dyspnea on exertion, leg edema-echo done results pending Chronic back pain and muscle spasm. Continue home medications.      Malnutrition Type:  Nutrition Problem: Increased nutrient needs Etiology: chronic illness(Pancreatic and liver lesions )   Malnutrition Characteristics:  Signs/Symptoms: estimated needs   Nutrition Interventions:  Interventions: Ensure Enlive (each supplement provides 350kcal and 20 grams of protein)  Estimated body mass index is 52.83 kg/m as calculated from the following:   Height as of this encounter: 5\' 10"  (1.778 m).   Weight as of this encounter: 167 kg.  Discharge Instructions  Discharge  Instructions    Call MD for:  difficulty breathing, headache or visual disturbances   Complete by:  As directed    Call MD for:  persistant nausea and vomiting   Complete by:  As directed    Call MD for:  temperature >100.4   Complete by:  As directed    Diet - low sodium heart healthy   Complete by:  As directed    Increase activity slowly   Complete by:  As directed      Allergies as of 09/03/2018      Reactions   Latex Hives, Shortness Of Breath   Tape    Tylenol With Codeine #3 [acetaminophen-codeine] Itching, Rash      Medication List    STOP taking these medications   zolpidem 5 MG tablet Commonly known as:  AMBIEN     TAKE these medications   albuterol 108 (90 Base) MCG/ACT inhaler Commonly known as:  PROAIR HFA INHALE TWO puffs BY MOUTH into the lungs EVERY 4 HOURS AS NEEDED FOR WHEEZING OR shortness of breath   amoxicillin-clavulanate 875-125 MG tablet Commonly known as:  AUGMENTIN Take 1 tablet by mouth every 12 (twelve) hours.   B-12 1000 MCG Subl Place 1,000 mcg under the tongue daily. What changed:  Another medication with the same name was added. Make sure you understand how and when to take each.   cyanocobalamin 1000 MCG tablet Take 1 tablet (1,000 mcg total) by mouth daily. Start taking on:  September 04, 2018 What changed:  You were already taking a medication with the same name, and this prescription was added. Make sure you understand how and when to take each.   Boric Acid 4 % Soln Place 1 capsule vaginally every Friday.   cetirizine 10 MG tablet Commonly known as:  ZYRTEC Take 1 tablet (10 mg total) by mouth daily.   clobetasol ointment 0.05 % Commonly known as:  TEMOVATE Apply 1 application topically 2 (two) times daily. Use for Lichen Sclerosis   cyclobenzaprine 10 MG tablet Commonly known as:  FLEXERIL Take 10 mg by mouth 3 (three) times daily as needed (muscle spasms.).   diazepam 5 MG tablet Commonly known as:  VALIUM Take 5 mg  by mouth daily as needed for anxiety (panic attacks).   diclofenac sodium 1 % Gel Commonly known as:  VOLTAREN Apply 2 g topically 4 (four) times daily as needed (for back/knee pain.).   dicyclomine 20 MG tablet Commonly known as:  BENTYL Take 1 tablet (20 mg total) by mouth 2 (two) times daily. What changed:  when to take this   ergocalciferol 1.25 MG (50000 UT) capsule Commonly known as:  VITAMIN D2 Take 50,000 Units by mouth every Monday.   lisinopril-hydrochlorothiazide 20-12.5 MG tablet Commonly known as:  PRINZIDE,ZESTORETIC Take 1 tablet by mouth daily.   methocarbamol 750 MG tablet Commonly known as:  ROBAXIN Take 750 mg by mouth every 8 (eight) hours as needed (back spasms.).   montelukast 10 MG tablet Commonly known as:  SINGULAIR Take 1 tablet (10 mg total) by mouth at bedtime.   nystatin powder Generic drug:  nystatin Apply 1 g topically 2 (two) times daily as needed (irritation). Use in skin folds and under breast   nystatin-triamcinolone cream Commonly known as:  MYCOLOG II Apply 1 application topically 2 (two) times daily as needed (irritation). Use in skin folds and under breast   pantoprazole 40 MG tablet Commonly known as:  PROTONIX Take 40 mg  by mouth daily before breakfast.   PATADAY 0.2 % Soln Generic drug:  Olopatadine HCl Apply 2 drops to eye 2 (two) times daily as needed (for irritated eyes.).   pimecrolimus 1 % cream Commonly known as:  ELIDEL Apply 1 application topically 2 (two) times daily.   saccharomyces boulardii 250 MG capsule Commonly known as:  FLORASTOR Take 1 capsule (250 mg total) by mouth 2 (two) times daily.   SYMBICORT 160-4.5 MCG/ACT inhaler Generic drug:  budesonide-formoterol Inhale 2 puffs into the lungs 2 (two) times daily. What changed:  See the new instructions.   tiZANidine 4 MG tablet Commonly known as:  ZANAFLEX Take 4 mg by mouth every 8 (eight) hours as needed for spasms.   traMADol 50 MG tablet Commonly  known as:  ULTRAM Take 50 mg by mouth every 6 (six) hours as needed for moderate pain (back pain.).   triamcinolone 55 MCG/ACT Aero nasal inhaler Commonly known as:  NASACORT Place 2 sprays into the nose daily as needed (for allergies).   valACYclovir 500 MG tablet Commonly known as:  VALTREX Take 500 mg by mouth 2 (two) times daily as needed (for cold sores).      Follow-up Information    Nolene Ebbs, MD Follow up.   Specialty:  Internal Medicine Contact information: [place] [address] [phone]        Brunetta Genera, MD Follow up.   Specialties:  Hematology, Oncology Contact information: [address] [place] [address] [phone]          Allergies  Allergen Reactions  . Latex Hives and Shortness Of Breath  . Tape   . Tylenol With Codeine #3 [Acetaminophen-Codeine] Itching and Rash    Consultations:  Oncology and IR   Procedures/Studies: Ct Abdomen Wo Contrast  Result Date: 08/27/2018 CLINICAL DATA:  Status post CT-guided biopsy of lesion in segment VII of the right lobe of the liver Faythe Ding[place] today under CT guidance. Significant abdominal pain during recovery. EXAM: CT ABDOMEN WITHOUT CONTRAST TECHNIQUE: Multidetector CT imaging of the abdomen was performed following the standard protocol without IV contrast. COMPARISON:  Imaging during liver biopsy earlier today as well as prior CT of the abdomen on 04/16/2018. FINDINGS: Lower chest: Stable scarring in the right lower lung adjacent to the major fissure. Hepatobiliary: Acute subcapsular and perihepatic hemorrhage is present after biopsy, primarily inferior to the right lobe of the liver. Lateral subcapsular component of hemorrhage adjacent to the right lobe measures approximately 1.4-1.5 cm in greatest thickness. Most of the acute hemorrhage is inferior to the right lobe where predominately subcapsular component measures approximately 7 x 10 x 12 cm. There is some  extension of blood inferiorly along the right pericolic gutter and into the pelvis. Mixed density is present in the hematoma just posterior to the right lobe inferiorly. Presence of active bleeding can not be determined without administration of IV contrast. No evidence of biliary ductal dilatation. At least 1 gallstone is present in the gallbladder. Pancreas: Unremarkable. No pancreatic ductal dilatation or surrounding inflammatory changes. Spleen: Normal in size without focal abnormality. Adrenals/Urinary Tract: Adrenal glands are unremarkable. Kidneys are normal, without renal calculi, focal lesion, or hydronephrosis. Stomach/Bowel: Visualized bowel is unremarkable. No ileus, obstruction or inflammation. No free air. Vascular/Lymphatic: No enlarged lymph nodes identified. Other: There is a moderate hiatal hernia. Musculoskeletal: No acute or significant osseous findings. IMPRESSION: Acute hemorrhage after liver biopsy consisting of a thin lateral subcapsular hemorrhage adjacent to the right lobe of  the liver and a larger infrahepatic hematoma which is largely subcapsular but with some inferior extension along the right pericolic gutter into the pelvis. Overall amount of hemorrhage is moderate, likely in the 400-450 mL range based on volume estimate. These results were called by telephone at the time of interpretation on 08/27/2018 at 4:15 pm to [facility], PA-C, who verbally acknowledged these results. Electronically Signed   By: Aletta Edouard M.D.   On: 08/27/2018 16:21   Ct Biopsy  Result Date: 08/27/2018 INDICATION: 47 year old female with multifocal hypermetabolic liver lesions and a probable colonic mass concerning for colon cancer metastatic to the liver. Prior ultrasound-guided biopsy to attempt was nondiagnostic. Patient presents for attempted CT-guided biopsy. EXAM: CT BIOPSY MEDICATIONS: None. ANESTHESIA/SEDATION: Moderate (conscious) sedation was employed during this procedure. A total of  Versed 2 mg and Fentanyl 100 mcg was administered intravenously. Moderate Sedation Time: 13 minutes. The patient's level of consciousness and vital signs were monitored continuously by radiology nursing throughout the procedure under my direct supervision. FLUOROSCOPY TIME:  None COMPLICATIONS: None immediate. PROCEDURE: Informed written consent was obtained from the patient after a thorough discussion of the procedural risks, benefits and alternatives. All questions were addressed. A timeout was performed prior to the initiation of the procedure. A planning axial CT scan was performed. Hypoattenuating liver lesion in hepatic segment 7 was successfully identified. A suitable skin entry site was selected and marked. The region was sterilely prepped and draped in standard fashion using chlorhexidine skin prep. Local anesthesia was attained by infiltration with 1% lidocaine. A small dermatotomy was made. Under intermittent CT guidance, a 20 cm 17 gauge introducer needle was carefully advanced through the liver and positioned at the margin of the hypoechoic mass. Multiple 18 gauge core biopsies were then coaxially obtained using the bio Pince automated biopsy device. Biopsy specimens were placed in formalin and delivered to pathology for further analysis. As the introducer needle was removed, the biopsy tract was embolized with a Gel-Foam slurry. The patient tolerated the procedure well. IMPRESSION: Technically successful CT-guided core biopsy of hepatic lesion. Electronically Signed   By: Jacqulynn Cadet M.D.   On: 08/27/2018 12:41   Dg Abd Acute W/chest  Result Date: 08/29/2018 CLINICAL DATA:  Liver biopsy 2 days ago. Right upper quadrant abdominal pain. EXAM: DG ABDOMEN ACUTE W/ 1V CHEST COMPARISON:  CT 08/27/2018.  Chest radiographs 07/06/2018. FINDINGS: 1137 hours. Significantly lower lung volumes with new bibasilar airspace opacities, probably atelectasis. No pneumothorax or large pleural effusion  identified. The heart size and mediastinal contours are stable for technique. No acute osseous findings evident. The bowel gas pattern is normal. There is no free intraperitoneal air. IMPRESSION: New bibasilar airspace opacities likely represent atelectasis given the significantly lower lung volumes. Aspiration and inflammation could have this appearance. No acute abdominal findings identified. Electronically Signed   By: Richardean Sale M.D.   On: 08/29/2018 13:02   US Thyroid  Result Date: 08/20/2018 CLINICAL DATA:  Incidental on PET. EXAM: THYROID ULTRASOUND TECHNIQUE: Ultrasound examination of the thyroid gland and adjacent soft tissues was performed. COMPARISON:  None. FINDINGS: Parenchymal Echotexture: Moderately heterogenous Isthmus: 0.4 cm Right lobe: 6.6 x 3.9 x 4.4 cm Left lobe: 5.5 x 1.6 x 2.5 cm _________________________________________________________ Estimated total number of nodules >/= 1 cm: 4 Number of spongiform nodules >/=  2 cm not described below (TR1): 0 Number of mixed cystic and solid nodules >/= 1.5 cm not described below (TR2): 0 _________________________________________________________ Nodule # 1: Location: Right; Mid Maximum  size: 5.6 cm; Other 2 dimensions: 3.6 x 3.4 cm Composition: mixed cystic and solid (1) Echogenicity: isoechoic (1) Shape: not taller-than-wide (0) Margins: smooth (0) Echogenic foci: none (0) ACR TI-RADS total points: 2. ACR TI-RADS risk category: TR2 (2 points). ACR TI-RADS recommendations: This nodule does NOT meet TI-RADS criteria for biopsy or dedicated follow-up. _________________________________________________________ Nodule # 4: Location: Left; Inferior Maximum size: 2.1 cm; Other 2 dimensions: 1.9 x 1.7 cm Composition: solid/almost completely solid (2) Echogenicity: hypoechoic (2) Shape: not taller-than-wide (0) Margins: ill-defined (0) Echogenic foci: macrocalcifications (1) ACR TI-RADS total points: 5. ACR TI-RADS risk category: TR4 (4-6 points). ACR  TI-RADS recommendations: **Given size (>/= 1.5 cm) and appearance, fine needle aspiration of this moderately suspicious nodule should be considered based on TI-RADS criteria. _________________________________________________________ Left nodules 2 and 3 are isoechoic and do not meet criteria for biopsy nor follow-up. IMPRESSION: Large complex solid and cystic nodule in the right lobe does not meet criteria for biopsy nor follow-up. Left nodule 4 meets criteria for fine needle aspiration biopsy. The above is in keeping with the ACR TI-RADS recommendations - J Am Coll Radiol 2017;14:587-595. Electronically Signed   By: Marybelle Killings M.D.   On: 08/20/2018 14:32   Ct Angio Abd/pel W/ And/or W/o  Result Date: 08/27/2018 CLINICAL DATA:  47 year old female with perihepatic hemorrhage status post CT-guided liver lesion biopsy. Evaluate for pseudoaneurysm or persistent bleeding. EXAM: CT ANGIOGRAPHY ABDOMEN AND PELVIS WITH CONTRAST AND WITHOUT CONTRAST TECHNIQUE: Multidetector CT imaging of the abdomen and pelvis was performed using the standard protocol during bolus administration of intravenous contrast. Multiplanar reconstructed images and MIPs were obtained and reviewed to evaluate the vascular anatomy. CONTRAST:  177mL ISOVUE-370 IOPAMIDOL (ISOVUE-370) INJECTION 76% COMPARISON:  None. FINDINGS: VASCULAR Aorta: Normal caliber aorta without aneurysm, dissection, vasculitis or significant stenosis. Celiac: Patent without evidence of aneurysm, dissection, vasculitis or significant stenosis. SMA: Patent without evidence of aneurysm, dissection, vasculitis or significant stenosis. Renals: Both renal arteries are patent without evidence of aneurysm, dissection, vasculitis, fibromuscular dysplasia or significant stenosis. IMA: Patent without evidence of aneurysm, dissection, vasculitis or significant stenosis. Inflow: Patent without evidence of aneurysm, dissection, vasculitis or significant stenosis. Proximal Outflow:  Bilateral common femoral and visualized portions of the superficial and profunda femoral arteries are patent without evidence of aneurysm, dissection, vasculitis or significant stenosis. Veins: No obvious venous abnormality within the limitations of this arterial phase study. Review of the MIP images confirms the above findings. NON-VASCULAR Lower chest: No acute abnormality. Hepatobiliary: Multiple hypoechoic hepatic lesions consistent with known/suspected multifocal hepatic metastatic disease. There is a moderate subcapsular hematoma arising from the posteromedial aspect of hepatic segment 6. The volume of the collection has not substantially changed compared to the noncontrast imaging done earlier today. The volume of hemorrhage appears stable. No evidence of active extravasation on arterial or venous phase imaging. Cholelithiasis noted incidentally. Pancreas: Unremarkable. No pancreatic ductal dilatation or surrounding inflammatory changes. Spleen: Normal in size without focal abnormality. Adrenals/Urinary Tract: Adrenal glands are unremarkable. Kidneys are normal, without renal calculi, focal lesion, or hydronephrosis. Bladder is unremarkable. Stomach/Bowel: Diffuse cecal thickening again noted consistent with patient's suspected colon malignancy. No evidence of obstruction. Lymphatic: No suspicious lymphadenopathy. Reproductive: Surgical changes of prior hysterectomy. Other: Small volume hemoperitoneum tracking along the right pericolic gutter and into the pelvic recesses. Musculoskeletal: No acute osseous abnormality. IMPRESSION: VASCULAR 1. No evidence of arterial pseudoaneurysm or active bleeding. NON-VASCULAR 1. Stable subcapsular, perihepatic and intraperitoneal hemorrhage without evidence of continued bleeding. Electronically Signed  By: Jacqulynn Cadet M.D.   On: 08/27/2018 18:27    (Echo, Carotid, EGD, Colonoscopy, ERCP)    Subjective:   Discharge Exam: Vitals:   09/01/18 1924 09/02/18  2059  BP:    Pulse:    Resp:    Temp:    SpO2: 98% 99%   Vitals:   08/31/18 0946 09/01/18 1203 09/01/18 1924 09/02/18 2059  BP:      Pulse:      Resp:      Temp:  98.3 F (36.8 C)    TempSrc:  Oral    SpO2: (!) 84%  98% 99%  Weight:      Height:        General: Pt is alert, awake, not in acute distress Cardiovascular: RRR, S1/S2 +, no rubs, no gallops Respiratory: CTA bilaterally, no wheezing, no rhonchi Abdominal: Soft, NT, ND, bowel sounds + Extremities: Trace edema  The results of significant diagnostics from this hospitalization (including imaging, microbiology, ancillary and laboratory) are listed below for reference.     Microbiology: Recent Results (from the past 240 hour(s))  Urine Culture     Status: None   Collection Time: 08/29/18  9:19 AM  Result Value Ref Range Status   Specimen Description   Final    URINE, CATHETERIZED Performed at [facility] [address], [place], [place] [address]    Special Requests   Final    NONE Performed at [facility], [place] [address] [place], [place] [address]    Culture   Final    NO GROWTH Performed at [facility], [place] [address] [place], [place] [address]    Report Status 08/30/2018 FINAL  Final  Culture, blood (routine x 2)     Status: None (Preliminary result)   Collection Time: 08/30/18  2:32 PM  Result Value Ref Range Status   Specimen Description   Final    BLOOD LEFT HAND Performed at [facility] [address], [place], [place] [address]    Special Requests   Final    BOTTLES DRAWN AEROBIC ONLY Blood Culture adequate volume Performed at [facility] [address], [place], [place] [address]    Culture   Final    NO GROWTH 4 DAYS Performed at [facility], [place] [address] [place], [place] [address]    Report Status PENDING  Incomplete  Culture, blood (routine x 2)     Status: None (Preliminary result)    Collection Time: 08/30/18  2:36 PM  Result Value Ref Range Status   Specimen Description   Final    RIGHT ANTECUBITAL Performed at [facility] [address] [place], [place] [address]    Special Requests   Final    BOTTLES DRAWN AEROBIC AND ANAEROBIC Blood Culture adequate volume Performed at [facility] [address], [place], [place] [address]    Culture   Final    NO GROWTH 4 DAYS Performed at [facility], [place] [address], [place], [place] [address]    Report Status PENDING  Incomplete     Labs: BNP (last 3 results) No results for input(s): BNP in the last 8760 hours. Basic Metabolic Panel: Recent Labs  Lab 08/27/18 1557 08/29/18 0343 08/30/18 0344 08/31/18 0357 09/01/18 0413  NA 143 143 141 141 141  K 3.4* 3.9 3.7 3.7 3.4*  CL 111 109 108 106 105  CO2 25 25 26  27 27  GLUCOSE 131* 109* 100* 107* 95  BUN 10 10 12 11 13   CREATININE 0.92 1.01* 1.01* 0.99 0.91  CALCIUM 7.9* 8.5* 8.0* 8.2* 7.9*   Liver Function Tests: Recent Labs  Lab 08/27/18 1557 08/29/18 0343 09/01/18 0413  AST 35 27 24  ALT 19 17 20   ALKPHOS 77 71 76  BILITOT 0.4 0.6 0.7  PROT 6.0* 6.6 6.1*  ALBUMIN 2.6* 2.8* 2.2*   No results for input(s): LIPASE, AMYLASE in the last 168 hours. No results for input(s): AMMONIA in the last 168 hours. CBC: Recent Labs  Lab 08/27/18 1557  08/28/18 0329  08/29/18 0343  08/30/18 0344 08/31/18 0357 09/01/18 0413 09/02/18 1039 09/03/18 0425  WBC 12.3*  --  8.3  --  13.4*  --  11.8* 11.5* 9.7 9.0 10.9*  NEUTROABS 9.8*  --  6.0  --  10.4*  --   --   --   --   --   --   HGB 8.4*   < > 7.4*   < > 9.8*   < > 9.0* 9.1* 8.0* 8.6* 9.4*  HCT 29.3*   < > 26.3*   < > 33.6*   < > 30.4* 31.0* 26.9* 29.6* 32.4*  MCV 96.7  --  98.9  --  97.4  --  96.5 97.8 98.5 98.7 97.3  PLT 278  --  234  --  244  --  228 228 241 270 337   < > = values in this interval not displayed.   Cardiac Enzymes: No results for input(s):  CKTOTAL, CKMB, CKMBINDEX, TROPONINI in the last 168 hours. BNP: Invalid input(s): POCBNP CBG: Recent Labs  Lab 08/27/18 1516  GLUCAP 145*   D-Dimer No results for input(s): DDIMER in the last 72 hours. Hgb A1c No results for input(s): HGBA1C in the last 72 hours. Lipid Profile No results for input(s): CHOL, HDL, LDLCALC, TRIG, CHOLHDL, LDLDIRECT in the last 72 hours. Thyroid function studies No results for input(s): TSH, T4TOTAL, T3FREE, THYROIDAB in the last 72 hours.  Invalid input(s): FREET3 Anemia work up No results for input(s): VITAMINB12, FOLATE, FERRITIN, TIBC, IRON, RETICCTPCT in the last 72 hours. Urinalysis    Component Value Date/Time   COLORURINE YELLOW 08/29/2018 0919   APPEARANCEUR CLEAR 08/29/2018 0919   LABSPEC 1.021 08/29/2018 0919   PHURINE 5.0 08/29/2018 0919   GLUCOSEU NEGATIVE 08/29/2018 0919   HGBUR LARGE (A) 08/29/2018 0919   BILIRUBINUR NEGATIVE 08/29/2018 0919   KETONESUR NEGATIVE 08/29/2018 0919   PROTEINUR NEGATIVE 08/29/2018 0919   UROBILINOGEN 0.2 01/06/2011 1755   NITRITE NEGATIVE 08/29/2018 0919   LEUKOCYTESUR NEGATIVE 08/29/2018 0919   [facility] Invalid input(s): PROCALCITONIN,  WBC,  LACTICIDVEN Microbiology Recent Results (from the past 240 hour(s))  Urine Culture     Status: None   Collection Time: 08/29/18  9:19 AM  Result Value Ref Range Status   Specimen Description   Final    URINE, CATHETERIZED Performed at [facility], [place] [address] [place], [place] [address]    Special Requests   Final    NONE Performed at [facility], [place] [address], [place], [place] [address]    Culture   Final    NO GROWTH Performed at [facility], [place] [address] [place], [place] [address]    Report Status 08/30/2018 FINAL  Final  Culture, blood (routine x 2)     Status: None (Preliminary result)   Collection Time: 08/30/18  2:32 PM  Result Value Ref Range Status   Specimen Description   Final     BLOOD LEFT HAND Performed at [place] [address] [place], [place] [address]    Special Requests   Final    BOTTLES DRAWN AEROBIC ONLY Blood Culture adequate volume Performed at [place] [address], [place], [place] [address]    Culture   Final    NO GROWTH 4 DAYS Performed at [facility], [place] [address] [place], [place] [address]    Report Status PENDING  Incomplete  Culture, blood (routine x 2)     Status: None (Preliminary result)   Collection Time: 08/30/18  2:36 PM  Result Value Ref Range Status   Specimen Description   Final    RIGHT ANTECUBITAL Performed at [place] [address] [place], [place] [address]    Special Requests   Final    BOTTLES DRAWN AEROBIC AND ANAEROBIC Blood Culture adequate volume Performed at [place] [address], [place], [place] [address]    Culture   Final    NO GROWTH 4 DAYS Performed at [facility], [place] [address] [place],  [address]    Report Status PENDING  Incomplete     Time coordinating discharge: 33  minutes  SIGNED:   Georgette Shell, MD  [facility] 09/03/2018, 11:40 AM Pager   If 7PM-7AM, please contact night-coverage [email or web address] Password [email or web address]

## 2018-09-03 NOTE — Progress Notes (Signed)
Pt was discharged home today. Instructions were reviewed with patient, and questions were answered. Pt was taken to main entrance via wheelchair by NT.  

## 2018-09-04 LAB — CULTURE, BLOOD (ROUTINE X 2)
Culture: NO GROWTH
Culture: NO GROWTH
Special Requests: ADEQUATE
Special Requests: ADEQUATE

## 2018-09-06 ENCOUNTER — Telehealth: Payer: Self-pay | Admitting: Hematology

## 2018-09-06 ENCOUNTER — Inpatient Hospital Stay (HOSPITAL_BASED_OUTPATIENT_CLINIC_OR_DEPARTMENT_OTHER): Payer: Medicaid Other | Admitting: Hematology

## 2018-09-06 ENCOUNTER — Inpatient Hospital Stay: Payer: Medicaid Other

## 2018-09-06 VITALS — BP 176/91 | HR 89 | Temp 98.4°F | Resp 18 | Ht 70.0 in | Wt 372.9 lb

## 2018-09-06 DIAGNOSIS — C180 Malignant neoplasm of cecum: Secondary | ICD-10-CM

## 2018-09-06 DIAGNOSIS — D509 Iron deficiency anemia, unspecified: Secondary | ICD-10-CM

## 2018-09-06 DIAGNOSIS — C787 Secondary malignant neoplasm of liver and intrahepatic bile duct: Secondary | ICD-10-CM | POA: Diagnosis not present

## 2018-09-06 DIAGNOSIS — I10 Essential (primary) hypertension: Secondary | ICD-10-CM

## 2018-09-06 DIAGNOSIS — C189 Malignant neoplasm of colon, unspecified: Secondary | ICD-10-CM | POA: Diagnosis not present

## 2018-09-06 DIAGNOSIS — E538 Deficiency of other specified B group vitamins: Secondary | ICD-10-CM | POA: Diagnosis not present

## 2018-09-06 DIAGNOSIS — Z87891 Personal history of nicotine dependence: Secondary | ICD-10-CM | POA: Diagnosis not present

## 2018-09-06 DIAGNOSIS — R1031 Right lower quadrant pain: Secondary | ICD-10-CM | POA: Diagnosis not present

## 2018-09-06 DIAGNOSIS — R42 Dizziness and giddiness: Secondary | ICD-10-CM | POA: Diagnosis not present

## 2018-09-06 DIAGNOSIS — Z7189 Other specified counseling: Secondary | ICD-10-CM

## 2018-09-06 DIAGNOSIS — D490 Neoplasm of unspecified behavior of digestive system: Secondary | ICD-10-CM | POA: Diagnosis not present

## 2018-09-06 LAB — FERRITIN: Ferritin: 658 ng/mL — ABNORMAL HIGH (ref 11–307)

## 2018-09-06 LAB — CMP (CANCER CENTER ONLY)
ALT: 17 U/L (ref 0–44)
AST: 15 U/L (ref 15–41)
Albumin: 2.6 g/dL — ABNORMAL LOW (ref 3.5–5.0)
Alkaline Phosphatase: 90 U/L (ref 38–126)
Anion gap: 8 (ref 5–15)
BUN: 6 mg/dL (ref 6–20)
CO2: 28 mmol/L (ref 22–32)
Calcium: 8.8 mg/dL — ABNORMAL LOW (ref 8.9–10.3)
Chloride: 109 mmol/L (ref 98–111)
Creatinine: 0.88 mg/dL (ref 0.44–1.00)
GFR, Est AFR Am: >60 mL/min (ref >60)
Glucose, Bld: 89 mg/dL (ref 70–99)
Potassium: 3.4 mmol/L — ABNORMAL LOW (ref 3.5–5.1)
Sodium: 145 mmol/L (ref 135–145)
Total Bilirubin: 0.5 mg/dL (ref 0.3–1.2)
Total Protein: 7.3 g/dL (ref 6.5–8.1)

## 2018-09-06 LAB — CBC WITH DIFFERENTIAL/PLATELET
Abs Immature Granulocytes: 0.04 10*3/uL (ref 0.00–0.07)
Basophils Absolute: 0 10*3/uL (ref 0.0–0.1)
Basophils Relative: 0 %
EOS PCT: 1 %
Eosinophils Absolute: 0.1 10*3/uL (ref 0.0–0.5)
HCT: 30.4 % — ABNORMAL LOW (ref 36.0–46.0)
Hemoglobin: 9.1 g/dL — ABNORMAL LOW (ref 12.0–15.0)
Immature Granulocytes: 0 %
Lymphocytes Relative: 21 %
Lymphs Abs: 1.9 10*3/uL (ref 0.7–4.0)
MCH: 28.3 pg (ref 26.0–34.0)
MCHC: 29.9 g/dL — ABNORMAL LOW (ref 30.0–36.0)
MCV: 94.7 fL (ref 80.0–100.0)
MONO ABS: 0.6 10*3/uL (ref 0.1–1.0)
Monocytes Relative: 6 %
Neutro Abs: 6.7 10*3/uL (ref 1.7–7.7)
Neutrophils Relative %: 72 %
Platelets: 434 10*3/uL — ABNORMAL HIGH (ref 150–400)
RBC: 3.21 MIL/uL — ABNORMAL LOW (ref 3.87–5.11)
RDW: 16.4 % — ABNORMAL HIGH (ref 11.5–15.5)
WBC: 9.3 10*3/uL (ref 4.0–10.5)
nRBC: 0 % (ref 0.0–0.2)

## 2018-09-06 LAB — IRON AND TIBC
IRON: 24 ug/dL — AB (ref 41–142)
Saturation Ratios: 15 % — ABNORMAL LOW (ref 21–57)
TIBC: 156 ug/dL — ABNORMAL LOW (ref 236–444)
UIBC: 133 ug/dL (ref 120–384)

## 2018-09-06 MED ORDER — LIDOCAINE-PRILOCAINE 2.5-2.5 % EX CREA: TOPICAL_CREAM | CUTANEOUS | 3 refills | Status: DC

## 2018-09-06 MED ORDER — DEXAMETHASONE 4 MG PO TABS: 8.0000 mg | ORAL_TABLET | Freq: Every day | ORAL | 1 refills | Status: DC

## 2018-09-06 MED ORDER — ONDANSETRON HCL 8 MG PO TABS: 8.0000 mg | ORAL_TABLET | Freq: Two times a day (BID) | ORAL | 1 refills | Status: DC | PRN

## 2018-09-06 MED ORDER — PROCHLORPERAZINE MALEATE 10 MG PO TABS: 10.0000 mg | ORAL_TABLET | Freq: Four times a day (QID) | ORAL | 1 refills | Status: DC | PRN

## 2018-09-06 NOTE — Progress Notes (Signed)
HEMATOLOGY/ONCOLOGY CONSULTATION NOTE  Date of Service: 09/06/2018  Patient Care Team: Nolene Ebbs, MD as PCP - General (Internal Medicine) Jackelyn Knife, MD as Rounding Team (Internal Medicine)  CHIEF COMPLAINTS/PURPOSE OF CONSULTATION:  Iron Deficiency Anemia  HISTORY OF PRESENTING ILLNESS:   Margaret [name] is a wonderful 47 y.o. female who has been referred to [place] by Dr. Nolene Ebbs for evaluation and management of Iron deficiency anemia. The pt reports that she is doing well overall.   The pt recently presented to the ED on 04/16/18 for right sided abdominal pain that was evaluated with a CT A/P which revealed liver and pancreatic cyst, recommended for outpatient MRI follow up. The pt was found to have a UTI, was treated with Rocephin and discharged with Keflex. Prior to this the pt was admitted on 04/09/18 for symptomatic anemia, presenting with HGB at 6.2, received 2 units PRBCs, one IV Iron infusion, and was encouraged to seek out GI as outpatient.  She has an appointment with [facility] on 05/19/18.   The pt reports that she is still feeling dizzy and light headed and denies feeling better after her recent blood transfusion. She notes that she dizziness presents with a feeling of warmness and that she begins hyperventilating, feeling nervous about her dizziness.  She notes that prior to her recent hospital stay, she never required a blood transfusion or IV iron replacement. She notes that as a child she had frequent nose bleeds, and was diagnosed with anemia. She also notes a history of ice picca.   She had a hysterectomy in 2012, and prior to this she had very heavy periods that began at age 5 and occurred almost constantly. She was on Depo and Mirena which did not help slow menstrual losses. Besides taking iron supplements while pregnant she has not taken PO iron replacement as she reports not tolerating it very well while pregnant.   She denies concern for black stools  or blood in the stools and has not had a colonoscopy or endoscopy before. The pt notes that she has not previously had a concern for stomach ulcers, but has acid reflux and takes Prevacid as needed for 7-8 years. She notes that she has not recently used Prevacid, for the last 3 months.  She is not taking vitamin replacements and denies any dietary restrictions.  The pt notes that she has been continuing to have lower right quadrant abdominal pain that radiates across her abdomen, presents for up to 45 minutes and occurs intermittently. She notes that her abdominal pain presented on 04/13/18.   The pt notes that was taking Ibuprofen 800mg  BID for 8 years to address her back pain related to her herniated disk. She stopped taking this 4 months ago.   Of note prior to the patient's visit today, pt has had CT A/P completed on 04/16/18 with results revealing No acute findings in the abdomen/pelvis. Least 4 low-density hepatic lesions, some of which may represent small cysts or hemangiomas, however there is a 19 mm lesion in the left lobe of the liver that is incompletely characterized. Recommend nonemergent hepatic protocol MRI for further evaluation.Marland Kitchen Possible 11 mm pancreatic nodule rising from the tail, alternatively this may represent a splenule. This can also be assessed at time of hepatic MRI. Gallstone without gallbladder inflammation. Colonic diverticulosis without diverticulitis.   Most recent lab results (04/16/18) of CBC is as follows: all values are WNL except for HGB at 9.5, HCT at 33.6, MCH at 23.8,  MCHC at 28.3, RDW at 23.2, PLT at 435k. Ferritin 04/09/18 was low at 6 Vitamin B12 on 04/09/18 was at 196  On review of systems, pt reports light headedness, dizziness, lower back pain, intermittent abdominal pain, and denies black stools, blood in the stools, tingling or numbness in her legs or arms, vaginal bleeding, and any other symptoms.  Interval History:   Margaret [name] returns today for  management and evaluation of her concerning metastatic liver lesions, Iron deficiency anemia and B12 deficiency. I last saw the pt as an inpatient on 09/02/18. She is accompanied today by her daughter. The pt reports that she is doing well overall.   The pt reports that she has a "little skip and SOB," when she is walking or standing. She notes that she gets SOB when she walks a couple feet. The pt notes that her leg swelling has resolved. She is not on Oxygen since she left the hospital.   Denies abdominal pains, blood in the stools. The pt is having looser stools and is moving her bowels 2-3 times a day, denies diarrhea. She also notes that she is feeling nauseous and hasn't used anything for this. She has tried to eat but finds her nausea limiting.  The pt notes that she has had sleep apnea for several years, and has used her CPAP "off and on." She notes that she has been using her CPAP since discharge a few days ago.   Lab results today (09/06/18) of CBC w/diff and CMP is as follows: all values are WNL except for RBC at 3.21, HGB at 9.1, HCT at 30.4, MCHC at 29.9, RDW at 16.4, PLT at 434k, Potassium at 3.4, Calcium at 8.8, Albumin at 2.6. 12/2/319 Ferritin is pending 09/06/18 Iron and TIBC revealed Iron at 24, TIBC at 156, anda 15% Saturation ratio.   On review of systems, pt reports SOB, nausea, moving her bowels well, resolved leg swelling, and denies abdominal pains, blood in the stools, diarrhea, and any other symptoms.    MEDICAL HISTORY:  Past Medical History:  Diagnosis Date  . Allergic rhinitis   . Anxiety diagnosed in 1990  . Arthritis   . Asthma   . Dyspnea    with low blood count hx of anemia  . Eczema   . History of blood transfusion   . Hypertension   . Lower back pain   . Migraine   . Sleep apnea    uses CPAP    SURGICAL HISTORY: Past Surgical History:  Procedure Laterality Date  . ABDOMINAL HYSTERECTOMY    . cortisone injections     knees bilat and back   .  iron infusion    . LIVER BIOPSY      SOCIAL HISTORY: Social History   Socioeconomic History  . Marital status: Single    Spouse name: Not on file  . Number of children: Not on file  . Years of education: Not on file  . Highest education level: Not on file  Occupational History  . Not on file  Social Needs  . Financial resource strain: Not on file  . Food insecurity:    Worry: Not on file    Inability: Not on file  . Transportation needs:    Medical: Not on file    Non-medical: Not on file  Tobacco Use  . Smoking status: Former Smoker    Last attempt to quit: 07/30/1999    Years since quitting: 19.1  . Smokeless tobacco: Never Used  Substance and Sexual Activity  . Alcohol use: No  . Drug use: No  . Sexual activity: Not on file  Lifestyle  . Physical activity:    Days per week: Not on file    Minutes per session: Not on file  . Stress: Not on file  Relationships  . Social connections:    Talks on phone: Not on file    Gets together: Not on file    Attends religious service: Not on file    Active member of club or organization: Not on file    Attends meetings of clubs or organizations: Not on file    Relationship status: Not on file  . Intimate partner violence:    Fear of current or ex partner: Not on file    Emotionally abused: Not on file    Physically abused: Not on file    Forced sexual activity: Not on file  Other Topics Concern  . Not on file  Social History Narrative  . Not on file    FAMILY HISTORY: No family history on file.  ALLERGIES:  is allergic to latex; tape; and tylenol with codeine #3 [acetaminophen-codeine].  MEDICATIONS:  Current Outpatient Medications  Medication Sig Dispense Refill  . albuterol (PROAIR HFA) 108 (90 Base) MCG/ACT inhaler INHALE TWO puffs BY MOUTH into the lungs EVERY 4 HOURS AS NEEDED FOR WHEEZING OR shortness of breath 18 g 1  . amoxicillin-clavulanate (AUGMENTIN) 875-125 MG tablet Take 1 tablet by mouth every 12  (twelve) hours. 10 tablet 0  . Boric Acid 4 % SOLN Place 1 capsule vaginally every Friday.     . cetirizine (ZYRTEC) 10 MG tablet Take 1 tablet (10 mg total) by mouth daily. 30 tablet 6  . clobetasol ointment (TEMOVATE) 8.46 % Apply 1 application topically 2 (two) times daily. Use for Lichen Sclerosis    . Cyanocobalamin (B-12) 1000 MCG SUBL Place 1,000 mcg under the tongue daily. 30 each 3  . cyclobenzaprine (FLEXERIL) 10 MG tablet Take 10 mg by mouth 3 (three) times daily as needed (muscle spasms.).     Marland Kitchen diazepam (VALIUM) 5 MG tablet Take 5 mg by mouth daily as needed for anxiety (panic attacks).     . diclofenac sodium (VOLTAREN) 1 % GEL Apply 2 g topically 4 (four) times daily as needed (for back/knee pain.).     Marland Kitchen dicyclomine (BENTYL) 20 MG tablet Take 1 tablet (20 mg total) by mouth 2 (two) times daily. (Patient taking differently: Take 20 mg by mouth 3 (three) times daily before meals. ) 20 tablet 0  . ergocalciferol (VITAMIN D2) 50000 UNITS capsule Take 50,000 Units by mouth every Monday.     Marland Kitchen lisinopril-hydrochlorothiazide (PRINZIDE,ZESTORETIC) 20-12.5 MG tablet Take 1 tablet by mouth daily.    . methocarbamol (ROBAXIN) 750 MG tablet Take 750 mg by mouth every 8 (eight) hours as needed (back spasms.).     Marland Kitchen montelukast (SINGULAIR) 10 MG tablet Take 1 tablet (10 mg total) by mouth at bedtime. 30 tablet 6  . nystatin (NYSTATIN) powder Apply 1 g topically 2 (two) times daily as needed (irritation). Use in skin folds and under breast     . nystatin-triamcinolone (MYCOLOG II) cream Apply 1 application topically 2 (two) times daily as needed (irritation). Use in skin folds and under breast     . pantoprazole (PROTONIX) 40 MG tablet Take 40 mg by mouth daily before breakfast.   1  . PATADAY 0.2 % SOLN Apply 2 drops to eye 2 (  two) times daily as needed (for irritated eyes.). 2.5 mL 5  . pimecrolimus (ELIDEL) 1 % cream Apply 1 application topically 2 (two) times daily. 30 g 6  . saccharomyces  boulardii (FLORASTOR) 250 MG capsule Take 1 capsule (250 mg total) by mouth 2 (two) times daily.    . SYMBICORT 160-4.5 MCG/ACT inhaler Inhale 2 puffs into the lungs 2 (two) times daily. (Patient taking differently: Inhale 2 puffs into the lungs 2 (two) times daily. ) 10.2 g 3  . tiZANidine (ZANAFLEX) 4 MG tablet Take 4 mg by mouth every 8 (eight) hours as needed for spasms.    . traMADol (ULTRAM) 50 MG tablet Take 50 mg by mouth every 6 (six) hours as needed for moderate pain (back pain.).     Marland Kitchen triamcinolone (NASACORT) 55 MCG/ACT AERO nasal inhaler Place 2 sprays into the nose daily as needed (for allergies). 1 Inhaler 3  . valACYclovir (VALTREX) 500 MG tablet Take 500 mg by mouth 2 (two) times daily as needed (for cold sores).     . vitamin B-12 1000 MCG tablet Take 1 tablet (1,000 mcg total) by mouth daily.     No current facility-administered medications for this visit.     REVIEW OF SYSTEMS:    A 10+ POINT REVIEW OF SYSTEMS WAS OBTAINED including neurology, dermatology, psychiatry, cardiac, respiratory, lymph, extremities, GI, GU, Musculoskeletal, constitutional, breasts, reproductive, HEENT.  All pertinent positives are noted in the HPI.  All others are negative.   PHYSICAL EXAMINATION:  . Vitals:   09/06/18 1440  BP: (!) 176/91  Pulse: 89  Resp: 18  Temp: 98.4 F (36.9 C)  SpO2: 100%   Filed Weights   09/06/18 1440  Weight: (!) 372 lb 14.4 oz (169.1 kg)   .Body mass index is 53.51 kg/m.  GENERAL:alert, in no acute distress and comfortable SKIN: no acute rashes, no significant lesions EYES: conjunctiva are pink and non-injected, sclera anicteric OROPHARYNX: MMM, no exudates, no oropharyngeal erythema or ulceration NECK: supple, no JVD LYMPH:  no palpable lymphadenopathy in the cervical, axillary or inguinal regions LUNGS: clear to auscultation b/l with normal respiratory effort HEART: regular rate & rhythm ABDOMEN:  normoactive bowel sounds , non tender, not distended.  No palpable hepatosplenomegaly.  Extremity: no pedal edema PSYCH: alert & oriented x 3 with fluent speech NEURO: no focal motor/sensory deficits    LABORATORY DATA:  I have reviewed the data as listed  . CBC Latest Ref Rng & Units 09/06/2018 09/03/2018 09/02/2018  WBC 4.0 - 10.5 K/uL 9.3 10.9(H) 9.0  Hemoglobin 12.0 - 15.0 g/dL 9.1(L) 9.4(L) 8.6(L)  Hematocrit 36.0 - 46.0 % 30.4(L) 32.4(L) 29.6(L)  Platelets 150 - 400 K/uL 434(H) 337 270    . CMP Latest Ref Rng & Units 09/06/2018 09/01/2018 08/31/2018  Glucose 70 - 99 mg/dL 89 95 107(H)  BUN 6 - 20 mg/dL 6 13 11   Creatinine 0.44 - 1.00 mg/dL 0.88 0.91 0.99  Sodium 135 - 145 mmol/L 145 141 141  Potassium 3.5 - 5.1 mmol/L 3.4(L) 3.4(L) 3.7  Chloride 98 - 111 mmol/L 109 105 106  CO2 22 - 32 mmol/L 28 27 27   Calcium 8.9 - 10.3 mg/dL 8.8(L) 7.9(L) 8.2(L)  Total Protein 6.5 - 8.1 g/dL 7.3 6.1(L) -  Total Bilirubin 0.3 - 1.2 mg/dL 0.5 0.7 -  Alkaline Phos 38 - 126 U/L 90 76 -  AST 15 - 41 U/L 15 24 -  ALT 0 - 44 U/L 17 20 -   .  Lab Results  Component Value Date   IRON 24 (L) 09/06/2018   TIBC 156 (L) 09/06/2018   IRONPCTSAT 15 (L) 09/06/2018   (Iron and TIBC)  Lab Results  Component Value Date   FERRITIN 94 08/19/2018   B12  - 196--> 584  07/22/18 Liver Biopsy:   08/27/18 Repeat Liver Biopsy:     RADIOGRAPHIC STUDIES: I have personally reviewed the radiological images as listed and agreed with the findings in the report. Ct Abdomen Wo Contrast  Result Date: 08/27/2018 CLINICAL DATA:  Status post CT-guided biopsy of lesion in segment VII of the right lobe of the liver Faythe Dingwall today under CT guidance. Significant abdominal pain during recovery. EXAM: CT ABDOMEN WITHOUT CONTRAST TECHNIQUE: Multidetector CT imaging of the abdomen was performed following the standard protocol without IV contrast. COMPARISON:  Imaging during liver biopsy earlier today as well as prior CT of the abdomen on 04/16/2018. FINDINGS: Lower chest:  Stable scarring in the right lower lung adjacent to the major fissure. Hepatobiliary: Acute subcapsular and perihepatic hemorrhage is present after biopsy, primarily inferior to the right lobe of the liver. Lateral subcapsular component of hemorrhage adjacent to the right lobe measures approximately 1.4-1.5 cm in greatest thickness. Most of the acute hemorrhage is inferior to the right lobe where predominately subcapsular component measures approximately 7 x 10 x 12 cm. There is some extension of blood inferiorly along the right pericolic gutter and into the pelvis. Mixed density is present in the hematoma just posterior to the right lobe inferiorly. Presence of active bleeding can not be determined without administration of IV contrast. No evidence of biliary ductal dilatation. At least 1 gallstone is present in the gallbladder. Pancreas: Unremarkable. No pancreatic ductal dilatation or surrounding inflammatory changes. Spleen: Normal in size without focal abnormality. Adrenals/Urinary Tract: Adrenal glands are unremarkable. Kidneys are normal, without renal calculi, focal lesion, or hydronephrosis. Stomach/Bowel: Visualized bowel is unremarkable. No ileus, obstruction or inflammation. No free air. Vascular/Lymphatic: No enlarged lymph nodes identified. Other: There is a moderate hiatal hernia. Musculoskeletal: No acute or significant osseous findings. IMPRESSION: Acute hemorrhage after liver biopsy consisting of a thin lateral subcapsular hemorrhage adjacent to the right lobe of the liver and a larger infrahepatic hematoma which is largely subcapsular but with some inferior extension along the right pericolic gutter into the pelvis. Overall amount of hemorrhage is moderate, likely in the 400-450 mL range based on volume estimate. These results were called by telephone at the time of interpretation on 08/27/2018 at 4:15 pm to [facility], PA-C, who verbally acknowledged these results. Electronically Signed   By:  Aletta Edouard M.D.   On: 08/27/2018 16:21   Ct Biopsy  Result Date: 08/27/2018 INDICATION: 47 year old female with multifocal hypermetabolic liver lesions and a probable colonic mass concerning for colon cancer metastatic to the liver. Prior ultrasound-guided biopsy to attempt was nondiagnostic. Patient presents for attempted CT-guided biopsy. EXAM: CT BIOPSY MEDICATIONS: None. ANESTHESIA/SEDATION: Moderate (conscious) sedation was employed during this procedure. A total of Versed 2 mg and Fentanyl 100 mcg was administered intravenously. Moderate Sedation Time: 13 minutes. The patient's level of consciousness and vital signs were monitored continuously by radiology nursing throughout the procedure under my direct supervision. FLUOROSCOPY TIME:  None COMPLICATIONS: None immediate. PROCEDURE: Informed written consent was obtained from the patient after a thorough discussion of the procedural risks, benefits and alternatives. All questions were addressed. A timeout was performed prior to the initiation of the procedure. A planning axial CT scan was performed. Hypoattenuating  liver lesion in hepatic segment 7 was successfully identified. A suitable skin entry site was selected and marked. The region was sterilely prepped and draped in standard fashion using chlorhexidine skin prep. Local anesthesia was attained by infiltration with 1% lidocaine. A small dermatotomy was made. Under intermittent CT guidance, a 20 cm 17 gauge introducer needle was carefully advanced through the liver and positioned at the margin of the hypoechoic mass. Multiple 18 gauge core biopsies were then coaxially obtained using the bio Pince automated biopsy device. Biopsy specimens were placed in formalin and delivered to pathology for further analysis. As the introducer needle was removed, the biopsy tract was embolized with a Gel-Foam slurry. The patient tolerated the procedure well. IMPRESSION: Technically successful CT-guided core biopsy  of hepatic lesion. Electronically Signed   By: Jacqulynn Cadet M.D.   On: 08/27/2018 12:41   Dg Abd Acute W/chest  Result Date: 08/29/2018 CLINICAL DATA:  Liver biopsy 2 days ago. Right upper quadrant abdominal pain. EXAM: DG ABDOMEN ACUTE W/ 1V CHEST COMPARISON:  CT 08/27/2018.  Chest radiographs 07/06/2018. FINDINGS: 1137 hours. Significantly lower lung volumes with new bibasilar airspace opacities, probably atelectasis. No pneumothorax or large pleural effusion identified. The heart size and mediastinal contours are stable for technique. No acute osseous findings evident. The bowel gas pattern is normal. There is no free intraperitoneal air. IMPRESSION: New bibasilar airspace opacities likely represent atelectasis given the significantly lower lung volumes. Aspiration and inflammation could have this appearance. No acute abdominal findings identified. Electronically Signed   By: Richardean Sale M.D.   On: 08/29/2018 13:02   Ir Imaging Guided Port Insertion  Result Date: 09/14/2018 CLINICAL DATA:  Cecal carcinoma. Needs durable venous access for chemotherapy regimen. EXAM: TUNNELED PORT CATHETER PLACEMENT WITH ULTRASOUND AND FLUOROSCOPIC GUIDANCE FLUOROSCOPY TIME:  0.2 minute; 167  uGym2 DAP ANESTHESIA/SEDATION: Intravenous Fentanyl and Versed were administered as conscious sedation during continuous monitoring of the patient's level of consciousness and physiological / cardiorespiratory status by the radiology RN, with a total moderate sedation time of 17 minutes. TECHNIQUE: The procedure, risks, benefits, and alternatives were explained to the patient. Questions regarding the procedure were encouraged and answered. The patient understands and consents to the procedure. As antibiotic prophylaxis, cefazolin 2 g was ordered pre-procedure and administered intravenously within one hour of incision. Patency of the right IJ vein was confirmed with ultrasound with image documentation. An appropriate skin  site was determined. Skin site was marked. Region was prepped using maximum barrier technique including cap and mask, sterile gown, sterile gloves, large sterile sheet, and Chlorhexidine as cutaneous antisepsis. The region was infiltrated locally with 1% lidocaine. Under real-time ultrasound guidance, the right IJ vein was accessed with a 21 gauge micropuncture needle; the needle tip within the vein was confirmed with ultrasound image documentation. Needle was exchanged over a 018 guidewire for transitional dilator which allowed passage of the [facility] wire into the IVC. Over this, the transitional dilator was exchanged for a 5 [place] MPA catheter. A small incision was made on the right anterior chest wall and a subcutaneous pocket fashioned. The power-injectable port was positioned and its catheter tunneled to the right IJ dermatotomy site. The MPA catheter was exchanged over an Amplatz wire for a peel-away sheath, through which the port catheter, which had been trimmed to the appropriate length, was advanced and positioned under fluoroscopy with its tip at the cavoatrial junction. Spot chest radiograph confirms good catheter position and no pneumothorax. The pocket was closed with deep interrupted and  subcuticular continuous 3-0 Monocryl sutures. The port was flushed per protocol. The incisions were covered with Dermabond then covered with a sterile dressing. COMPLICATIONS: COMPLICATIONS None immediate IMPRESSION: Technically successful right IJ power-injectable port catheter placement. Ready for routine use. Electronically Signed   By: Lucrezia Europe M.D.   On: 09/14/2018 16:55   Ct Angio Abd/pel W/ And/or W/o  Result Date: 08/27/2018 CLINICAL DATA:  47 year old female with perihepatic hemorrhage status post CT-guided liver lesion biopsy. Evaluate for pseudoaneurysm or persistent bleeding. EXAM: CT ANGIOGRAPHY ABDOMEN AND PELVIS WITH CONTRAST AND WITHOUT CONTRAST TECHNIQUE: Multidetector CT imaging of the abdomen  and pelvis was performed using the standard protocol during bolus administration of intravenous contrast. Multiplanar reconstructed images and MIPs were obtained and reviewed to evaluate the vascular anatomy. CONTRAST:  117mL ISOVUE-370 IOPAMIDOL (ISOVUE-370) INJECTION 76% COMPARISON:  None. FINDINGS: VASCULAR Aorta: Normal caliber aorta without aneurysm, dissection, vasculitis or significant stenosis. Celiac: Patent without evidence of aneurysm, dissection, vasculitis or significant stenosis. SMA: Patent without evidence of aneurysm, dissection, vasculitis or significant stenosis. Renals: Both renal arteries are patent without evidence of aneurysm, dissection, vasculitis, fibromuscular dysplasia or significant stenosis. IMA: Patent without evidence of aneurysm, dissection, vasculitis or significant stenosis. Inflow: Patent without evidence of aneurysm, dissection, vasculitis or significant stenosis. Proximal Outflow: Bilateral common femoral and visualized portions of the superficial and profunda femoral arteries are patent without evidence of aneurysm, dissection, vasculitis or significant stenosis. Veins: No obvious venous abnormality within the limitations of this arterial phase study. Review of the MIP images confirms the above findings. NON-VASCULAR Lower chest: No acute abnormality. Hepatobiliary: Multiple hypoechoic hepatic lesions consistent with known/suspected multifocal hepatic metastatic disease. There is a moderate subcapsular hematoma arising from the posteromedial aspect of hepatic segment 6. The volume of the collection has not substantially changed compared to the noncontrast imaging done earlier today. The volume of hemorrhage appears stable. No evidence of active extravasation on arterial or venous phase imaging. Cholelithiasis noted incidentally. Pancreas: Unremarkable. No pancreatic ductal dilatation or surrounding inflammatory changes. Spleen: Normal in size without focal abnormality.  Adrenals/Urinary Tract: Adrenal glands are unremarkable. Kidneys are normal, without renal calculi, focal lesion, or hydronephrosis. Bladder is unremarkable. Stomach/Bowel: Diffuse cecal thickening again noted consistent with patient's suspected colon malignancy. No evidence of obstruction. Lymphatic: No suspicious lymphadenopathy. Reproductive: Surgical changes of prior hysterectomy. Other: Small volume hemoperitoneum tracking along the right pericolic gutter and into the pelvic recesses. Musculoskeletal: No acute osseous abnormality. IMPRESSION: VASCULAR 1. No evidence of arterial pseudoaneurysm or active bleeding. NON-VASCULAR 1. Stable subcapsular, perihepatic and intraperitoneal hemorrhage without evidence of continued bleeding. Electronically Signed   By: Jacqulynn Cadet M.D.   On: 08/27/2018 18:27   PET/CT 07/16/2018: PRESSION: 1. There is a solid hypermetabolic mass involving the cecum, worrisome for colonic primary neoplasm. Correlation with colonoscopy findings. 2. Multiple hypermetabolic liver lesions compatible with metastatic disease. 3. Large low-density lesion in right lobe of thyroid gland. Advise further evaluation with thyroid sonography. 4.  Aortic Atherosclerosis (ICD10-I70.0). 5. Small hiatal hernia.   Electronically Signed   By: Kerby Moors M.D.   On: 07/16/2018 20:33   ASSESSMENT & PLAN:   47 y.o. female with  1. Iron Deficiency Anemia -  Due to colon cancer  2. B12 deficiency Labs upon initial presentation from 04/16/18, HGB at 9.5. The 04/09/18 Ferritin was low at 6 and Vitamin B12 was at 196 -No antiparietal antibodies, no intrinsic factor antibodies, normal TSH all from 04/28/18  PLAN: -Continue NSAID avoidance  -continue 1054mcg Vitamin  B12 sublingually daily - B12 improved from 196 to 584 -Rx of cecal cancer.  3.MetastaticAdenocarcinoma of colon.  04/16/18 CT A/P with pt revealed No acute findings in the abdomen/pelvis. Least 4 low-density  hepatic lesions, some of which may represent small cysts or hemangiomas, however there is a 19 mm lesion in the left lobe of the liver that is incompletely characterized. Recommend nonemergent hepatic protocol MRI for further evaluation.Marland Kitchen Possible 11 mm pancreatic nodule rising from the tail, alternatively this may represent a splenule. This can also be assessed at time of hepatic MRI. Gallstone without gallbladder inflammation. Colonic diverticulosis without diverticulitis.   07/09/18 Tumor marker work up: CEA normal at 1.32, AFP normal at 1.5, LDH normal at 138, CA 125 normal at 5.9, CA 19-9 normal at <2   07/22/18 Liver biopsy results revealed benign hepatic tissue, and was not diagnostic   07/16/18 PET/CT revealedThere is a solid hypermetabolic mass involving the cecum, worrisome for colonic primary neoplasm. Correlation withcolonoscopy Findings. 2. Multiple hypermetabolic liver lesions compatible with metastaticdisease. 3. Large low-density lesion in right lobe of thyroid gland. Advise further evaluation with thyroid sonography. 4. Aortic Atherosclerosis. 5. Small hiatal hernia.   08/27/18 Liver biopsy revealed Metastatic adenocarcinoma, consistent with gastrointestinal primary   4. Thyroid FDG avid lesion -will later need US thyroid and possible biopsy.  PLAN: -Discussed pt labwork today, 09/06/18; HGB stable at 9.1, WBC normal, PLT at 434k. -09/06/18 Ferritin is pending  -Discussed the 09/03/18 ECHO which revealed an LV EF of 34-19%, mild diastolic dysfunction; mild RVE; dilated PA; mild TR; moderate to severe pulmonary hypertension -Discussed the necessity to use the patient's CPAP every time she sleeps, whether at night or for a nap -Reschedule Colonoscopy as soon as possible -Again discussed the first line treatment option and recommendation, FOLFOX and discussed the recommendation to avoid cold objects and fluids during treatment  -Will evaluate further treatment options as the  pending genetic testing results  -Will send supportive medications to the patient's pharmacy  -Will set pt up for chemotherapy counseling -Will refer the pt to IR for port placement -Will set the pt up to tentatively begin C1 FOLFOX  -Continue eating well, staying hydrated -Recommend saline sprays OTC to prevent further epistaxis  -Recommend soft foods that are easy to digest  -Will see the pt back in 2 weeks    Port a cath placement by IR in 3-4 days Chemo-counseling for FOLFOX for metastatic colon cancer Please schedule patient to start FOLFOX on 1/2 or 1/3 with labs and MD visit   All of the patients questions were answered with apparent satisfaction. The patient knows to call the clinic with any problems, questions or concerns.  . The total time spent in the appointment was 25 minutes and more than 50% was on counseling and direct patient cares.      Sullivan Lone MD MS AAHIVMS [facility] Hematology/Oncology Physician [facility]  (Office):       [phone] (Work cell):  [phone] (Fax):           [phone]  09/06/2018 3:22 PM  I, Baldwin Jamaica, am acting as a scribe for Dr. Sullivan Lone.   .I have reviewed the above documentation for accuracy and completeness, and I agree with the above. Brunetta Genera MD

## 2018-09-06 NOTE — Progress Notes (Signed)
START OFF PATHWAY REGIMEN - Colorectal   [id number]:FOLFOX (q14d):   A cycle is every 14 days:     Oxaliplatin      Leucovorin      5-Fluorouracil      5-Fluorouracil   **Always confirm dose/schedule in your pharmacy ordering system**  Patient Characteristics: Distant Metastases, First Line, Nonsurgical Candidate, KRAS Mutation Positive/Unknown, BRAF Wild-Type/Unknown, PS > 1; Bevacizumab Ineligible Therapeutic Status: Distant Metastases BRAF Mutation Status: Awaiting Test Results KRAS/NRAS Mutation Status: Awaiting Test Results Line of Therapy: First Line Performance Status: PS > 1 Bevacizumab Eligibility: Ineligible Intent of Therapy: Non-Curative / Palliative Intent, Discussed with Patient

## 2018-09-06 NOTE — Telephone Encounter (Signed)
Per 12/23 los scheduled chemo class.  Printed calendar and avs.

## 2018-09-07 ENCOUNTER — Telehealth: Payer: Self-pay | Admitting: Hematology

## 2018-09-07 ENCOUNTER — Telehealth: Payer: Self-pay | Admitting: *Deleted

## 2018-09-07 NOTE — Telephone Encounter (Signed)
Patient called. IR appt for port placement is 12/31. She is not sure she will have transportation for this. Patient thinks her daughter can bring her. Also reached out to SW to see if transportation voucher was possible. Will reconnect with patient on 12/26

## 2018-09-07 NOTE — Telephone Encounter (Signed)
Spoke with patient about scheduled appointment.  Printed and mailed calendar per patient request.

## 2018-09-09 ENCOUNTER — Telehealth: Payer: Self-pay | Admitting: General Practice

## 2018-09-09 ENCOUNTER — Inpatient Hospital Stay: Payer: Medicaid Other

## 2018-09-09 NOTE — Telephone Encounter (Signed)
[place] CSW Progress Notes  Request received from Dr Grier Mitts desk nurse to reach out to patient to discuss alternative arrangements for transportation to non [facility] appointments.  Referral made to [facility] ([phone])- will ask them to transport patient and caregiver to upcoming port placement appt if needed .  Patient applied for SSA disability in August 2019, is awaiting determination.  Has asked for additional information on cancer diagnosis to be faxed to [facility] to put w current application, patient states this was done by last week by [facility] staff.  Also discussed how to best support patient's 49 year old son as mother is undergoing treatment for cancer.  Will put together information packet for her to review and provide to chemo ed nurse.  Encouraged patient to review and then call to set up time w CSW [place] or Candis Schatz to discuss strategy for parenting in the context of cancer treatment.  Patient agreeable to all the above.  Edwyna Shell, LCSW Clinical Social Worker Phone:  [phone]  .

## 2018-09-10 ENCOUNTER — Telehealth: Payer: Self-pay | Admitting: *Deleted

## 2018-09-10 ENCOUNTER — Encounter (HOSPITAL_COMMUNITY): Payer: Medicaid Other

## 2018-09-10 NOTE — Telephone Encounter (Signed)
At patient's request (ROI completed), faxed copy of pathology report and last OV to [facility] Disability Determination, attn Jesse Sans. Fax confirmation received

## 2018-09-13 ENCOUNTER — Other Ambulatory Visit: Payer: Self-pay | Admitting: Radiology

## 2018-09-14 ENCOUNTER — Ambulatory Visit (HOSPITAL_COMMUNITY)
Admission: RE | Admit: 2018-09-14 | Discharge: 2018-09-14 | Disposition: A | Discharge status: Home / Self Care | Payer: Medicaid Other | Source: Ambulatory Visit | Attending: Hematology | Admitting: Hematology

## 2018-09-14 ENCOUNTER — Encounter (HOSPITAL_COMMUNITY): Payer: Self-pay

## 2018-09-14 ENCOUNTER — Other Ambulatory Visit: Payer: Self-pay

## 2018-09-14 ENCOUNTER — Ambulatory Visit (HOSPITAL_COMMUNITY): Payer: Medicaid Other

## 2018-09-14 DIAGNOSIS — C180 Malignant neoplasm of cecum: Secondary | ICD-10-CM | POA: Diagnosis not present

## 2018-09-14 DIAGNOSIS — I10 Essential (primary) hypertension: Secondary | ICD-10-CM | POA: Diagnosis not present

## 2018-09-14 DIAGNOSIS — Z91040 Latex allergy status: Secondary | ICD-10-CM | POA: Diagnosis not present

## 2018-09-14 DIAGNOSIS — Z90710 Acquired absence of both cervix and uterus: Secondary | ICD-10-CM | POA: Insufficient documentation

## 2018-09-14 DIAGNOSIS — G4730 Sleep apnea, unspecified: Secondary | ICD-10-CM | POA: Diagnosis not present

## 2018-09-14 DIAGNOSIS — M1990 Unspecified osteoarthritis, unspecified site: Secondary | ICD-10-CM | POA: Insufficient documentation

## 2018-09-14 DIAGNOSIS — Z888 Allergy status to other drugs, medicaments and biological substances status: Secondary | ICD-10-CM | POA: Insufficient documentation

## 2018-09-14 DIAGNOSIS — Z885 Allergy status to narcotic agent status: Secondary | ICD-10-CM | POA: Insufficient documentation

## 2018-09-14 DIAGNOSIS — Z7951 Long term (current) use of inhaled steroids: Secondary | ICD-10-CM | POA: Insufficient documentation

## 2018-09-14 DIAGNOSIS — Z79899 Other long term (current) drug therapy: Secondary | ICD-10-CM | POA: Insufficient documentation

## 2018-09-14 DIAGNOSIS — C787 Secondary malignant neoplasm of liver and intrahepatic bile duct: Secondary | ICD-10-CM | POA: Diagnosis not present

## 2018-09-14 HISTORY — PX: IR IMAGING GUIDED PORT INSERTION: IMG5740

## 2018-09-14 LAB — CBC WITH DIFFERENTIAL/PLATELET
Abs Immature Granulocytes: 0.04 10*3/uL (ref 0.00–0.07)
Basophils Absolute: 0 10*3/uL (ref 0.0–0.1)
Basophils Relative: 0 %
Eosinophils Absolute: 0.1 10*3/uL (ref 0.0–0.5)
Eosinophils Relative: 1 %
HCT: 35.7 % — ABNORMAL LOW (ref 36.0–46.0)
Hemoglobin: 10.3 g/dL — ABNORMAL LOW (ref 12.0–15.0)
IMMATURE GRANULOCYTES: 1 %
Lymphocytes Relative: 24 %
Lymphs Abs: 2 10*3/uL (ref 0.7–4.0)
MCH: 28.3 pg (ref 26.0–34.0)
MCHC: 28.9 g/dL — ABNORMAL LOW (ref 30.0–36.0)
MCV: 98.1 fL (ref 80.0–100.0)
Monocytes Absolute: 0.5 10*3/uL (ref 0.1–1.0)
Monocytes Relative: 6 %
Neutro Abs: 5.8 10*3/uL (ref 1.7–7.7)
Neutrophils Relative %: 68 %
Platelets: 483 10*3/uL — ABNORMAL HIGH (ref 150–400)
RBC: 3.64 MIL/uL — ABNORMAL LOW (ref 3.87–5.11)
RDW: 16.1 % — ABNORMAL HIGH (ref 11.5–15.5)
WBC: 8.4 10*3/uL (ref 4.0–10.5)
nRBC: 0 % (ref 0.0–0.2)

## 2018-09-14 LAB — PROTIME-INR
INR: 1.06
Prothrombin Time: 13.7 seconds (ref 11.4–15.2)

## 2018-09-14 MED ORDER — CEFAZOLIN SODIUM-DEXTROSE 2-4 GM/100ML-% IV SOLN
INTRAVENOUS | Status: AC
  Administered 2018-09-14: 2 g via INTRAVENOUS
  Filled 2018-09-14: qty 100

## 2018-09-14 MED ORDER — MIDAZOLAM HCL 2 MG/2ML IJ SOLN
INTRAMUSCULAR | Status: AC | PRN
  Administered 2018-09-14 (×2): 1 mg via INTRAVENOUS

## 2018-09-14 MED ORDER — CEFAZOLIN SODIUM-DEXTROSE 2-4 GM/100ML-% IV SOLN
2.0000 g | INTRAVENOUS | Status: AC
  Administered 2018-09-14: 2 g via INTRAVENOUS

## 2018-09-14 MED ORDER — SODIUM CHLORIDE 0.9 % IV SOLN
INTRAVENOUS | Status: DC
  Administered 2018-09-14: 09:00:00 via INTRAVENOUS

## 2018-09-14 MED ORDER — LIDOCAINE-EPINEPHRINE (PF) 1 %-1:200000 IJ SOLN
INTRAMUSCULAR | Status: AC | PRN
  Administered 2018-09-14 (×2): 10 mL

## 2018-09-14 MED ORDER — MIDAZOLAM HCL 2 MG/2ML IJ SOLN
INTRAMUSCULAR | Status: AC
  Filled 2018-09-14: qty 2

## 2018-09-14 MED ORDER — LIDOCAINE-EPINEPHRINE (PF) 2 %-1:200000 IJ SOLN
INTRAMUSCULAR | Status: AC
  Filled 2018-09-14: qty 20

## 2018-09-14 MED ORDER — HEPARIN SOD (PORK) LOCK FLUSH 100 UNIT/ML IV SOLN
INTRAVENOUS | Status: AC | PRN
  Administered 2018-09-14: 500 [IU] via INTRAVENOUS

## 2018-09-14 MED ORDER — FENTANYL CITRATE (PF) 100 MCG/2ML IJ SOLN
INTRAMUSCULAR | Status: AC
  Filled 2018-09-14: qty 2

## 2018-09-14 MED ORDER — HEPARIN SOD (PORK) LOCK FLUSH 100 UNIT/ML IV SOLN
INTRAVENOUS | Status: AC
  Filled 2018-09-14: qty 5

## 2018-09-14 MED ORDER — FENTANYL CITRATE (PF) 100 MCG/2ML IJ SOLN
INTRAMUSCULAR | Status: AC | PRN
  Administered 2018-09-14 (×2): 50 ug via INTRAVENOUS

## 2018-09-14 NOTE — Discharge Instructions (Signed)
DO NOT USE EMLA CREAM OVER PORT UNTIL IT HAS HEALED. EMLA CREAM WILL DISSOLVE THE SKIN GLUE AND THE SKIN WILL PULL APART RESULTING IN INFECTION.    Implanted Port Insertion, Care After This sheet gives you information about how to care for yourself after your procedure. Your health care provider may also give you more specific instructions. If you have problems or questions, contact your health care provider. What can I expect after the procedure? After the procedure, it is common to have:  Discomfort at the port insertion site.  Bruising on the skin over the port. This should improve over 3-4 days. Follow these instructions at home: [facility] care  After your port is placed, you will get a manufacturer's information card. The card has information about your port. Keep this card with you at all times.  Take care of the port as told by your health care provider. Ask your health care provider if you or a family member can get training for taking care of the port at home. A home health care nurse may also take care of the port.  Make sure to remember what type of port you have. Incision care      Follow instructions from your health care provider about how to take care of your port insertion site. Make sure you: ? Wash your hands with soap and water before and after you change your bandage (dressing). If soap and water are not available, use hand sanitizer. ? Change your dressing as told by your health care provider. ? Leave stitches (sutures), skin glue, or adhesive strips in place. These skin closures may need to stay in place for 2 weeks or longer. If adhesive strip edges start to loosen and curl up, you may trim the loose edges. Do not remove adhesive strips completely unless your health care provider tells you to do that.  Check your port insertion site every day for signs of infection. Check for: ? Redness, swelling, or pain. ? Fluid or blood. ? Warmth. ? Pus or a bad  smell. Activity  Return to your normal activities as told by your health care provider. Ask your health care provider what activities are safe for you.  Do not lift anything that is heavier than 10 lb (4.5 kg), or the limit that you are told, until your health care provider says that it is safe. General instructions  Take over-the-counter and prescription medicines only as told by your health care provider.  Do not take baths, swim, or use a hot tub until your health care provider approves. Ask your health care provider if you may take showers. You may only be allowed to take sponge baths.  Do not drive for 24 hours if you were given a sedative during your procedure.  Wear a medical alert bracelet in case of an emergency. This will tell any health care providers that you have a port.  Keep all follow-up visits as told by your health care provider. This is important. Contact a health care provider if:  You cannot flush your port with saline as directed, or you cannot draw blood from the port.  You have a fever or chills.  You have redness, swelling, or pain around your port insertion site.  You have fluid or blood coming from your port insertion site.  Your port insertion site feels warm to the touch.  You have pus or a bad smell coming from the port insertion site. Get help right away if:  You  have chest pain or shortness of breath.  You have bleeding from your port that you cannot control. Summary  Take care of the port as told by your health care provider. Keep the manufacturer's information card with you at all times.  Change your dressing as told by your health care provider.  Contact a health care provider if you have a fever or chills or if you have redness, swelling, or pain around your port insertion site.  Keep all follow-up visits as told by your health care provider. This information is not intended to replace advice given to you by your health care provider.  Make sure you discuss any questions you have with your health care provider. Document Released: 06/22/2013 Document Revised: 03/30/2018 Document Reviewed: 03/30/2018 Elsevier Interactive Patient Education  2019 [employer].   Moderate Conscious Sedation, Adult, Care After These instructions provide you with information about caring for yourself after your procedure. Your health care provider may also give you more specific instructions. Your treatment has been planned according to current medical practices, but problems sometimes occur. Call your health care provider if you have any problems or questions after your procedure. What can I expect after the procedure? After your procedure, it is common:  To feel sleepy for several hours.  To feel clumsy and have poor balance for several hours.  To have poor judgment for several hours.  To vomit if you eat too soon. Follow these instructions at home: For at least 24 hours after the procedure:   Do not: ? Participate in activities where you could fall or become injured. ? Drive. ? Use heavy machinery. ? Drink alcohol. ? Take sleeping pills or medicines that cause drowsiness. ? Make important decisions or sign legal documents. ? Take care of children on your own.  Rest. Eating and drinking  Follow the diet recommended by your health care provider.  If you vomit: ? Drink water, juice, or soup when you can drink without vomiting. ? Make sure you have little or no nausea before eating solid foods. General instructions  Have a responsible adult stay with you until you are awake and alert.  Take over-the-counter and prescription medicines only as told by your health care provider.  If you smoke, do not smoke without supervision.  Keep all follow-up visits as told by your health care provider. This is important. Contact a health care provider if:  You keep feeling nauseous or you keep vomiting.  You feel light-headed.  You  develop a rash.  You have a fever. Get help right away if:  You have trouble breathing. This information is not intended to replace advice given to you by your health care provider. Make sure you discuss any questions you have with your health care provider. Document Released: 06/22/2013 Document Revised: 02/04/2016 Document Reviewed: 12/22/2015 Elsevier Interactive Patient Education  2019 [employer].

## 2018-09-14 NOTE — Procedures (Signed)
  Procedure: R IJ Port catheter   EBL:   minimal Complications:  none immediate  See full dictation in [facility] PACS.  D. Lejon Afzal MD Main # [phone] Pager  [phone]    

## 2018-09-14 NOTE — H&P (Signed)
Referring Physician(s): Brunetta Genera  Supervising Physician: Arne Cleveland  Patient Status:  [name] OP  Chief Complaint:  "I'm getting a port a cath"  Subjective: Patient familiar to IR service from prior liver lesion biopsies on 07/22/2018 and 08/27/2018.  Biopsy was complicated by subcapsular hematoma/hemorrhage requiring admission to the hospital for several days.  She has a history of recently diagnosed metastatic adenocarcinoma of probable GI origin and presents again today for Port-A-Cath placement for planned chemotherapy.  She currently denies fever, headache, chest pain, dyspnea, cough, back pain, nausea, vomiting or bleeding.  She does have some intermittent abdominal discomfort.  Past Medical History:  Diagnosis Date  . Allergic rhinitis   . Anxiety diagnosed in 1990  . Arthritis   . Asthma   . Dyspnea    with low blood count hx of anemia  . Eczema   . History of blood transfusion   . Hypertension   . Lower back pain   . Migraine   . Sleep apnea    uses CPAP   Past Surgical History:  Procedure Laterality Date  . ABDOMINAL HYSTERECTOMY    . cortisone injections     knees bilat and back   . iron infusion    . LIVER BIOPSY        Allergies: Latex; Tape; and Tylenol with codeine #3 [acetaminophen-codeine]  Medications: Prior to Admission medications   Medication Sig Start Date End Date Taking? Authorizing Provider  albuterol (PROAIR HFA) 108 (90 Base) MCG/ACT inhaler INHALE TWO puffs BY MOUTH into the lungs EVERY 4 HOURS AS NEEDED FOR WHEEZING OR shortness of breath 08/19/18  Yes Valentina Shaggy, MD  amoxicillin-clavulanate (AUGMENTIN) 875-125 MG tablet Take 1 tablet by mouth every 12 (twelve) hours. 09/03/18  Yes Georgette Shell, MD  Boric Acid 4 % SOLN Place 1 capsule vaginally every Friday.    Yes [provider]  cetirizine (ZYRTEC) 10 MG tablet Take 1 tablet (10 mg total) by mouth daily. 07/06/18  Yes Valentina Shaggy, MD   clobetasol ointment (TEMOVATE) 4.01 % Apply 1 application topically 2 (two) times daily. Use for Lichen Sclerosis   Yes Bovard-Stuckert, Jody, MD  Cyanocobalamin (B-12) 1000 MCG SUBL Place 1,000 mcg under the tongue daily. 04/28/18  Yes Brunetta Genera, MD  dicyclomine (BENTYL) 20 MG tablet Take 1 tablet (20 mg total) by mouth 2 (two) times daily. Patient taking differently: Take 20 mg by mouth 3 (three) times daily before meals.  04/16/18  Yes Ocie Cornfield T, PA-C  ergocalciferol (VITAMIN D2) 50000 UNITS capsule Take 50,000 Units by mouth every Monday.    Yes [provider]  lisinopril-hydrochlorothiazide (PRINZIDE,ZESTORETIC) 20-12.5 MG tablet Take 1 tablet by mouth daily.   Yes [provider]  montelukast (SINGULAIR) 10 MG tablet Take 1 tablet (10 mg total) by mouth at bedtime. 07/06/18  Yes Valentina Shaggy, MD  pantoprazole (PROTONIX) 40 MG tablet Take 40 mg by mouth daily before breakfast.  06/24/18  Yes [provider]  PATADAY 0.2 % SOLN Apply 2 drops to eye 2 (two) times daily as needed (for irritated eyes.). 07/30/18  Yes Valentina Shaggy, MD  pimecrolimus (ELIDEL) 1 % cream Apply 1 application topically 2 (two) times daily. 07/06/18  Yes Valentina Shaggy, MD  prochlorperazine (COMPAZINE) 10 MG tablet Take 1 tablet (10 mg total) by mouth every 6 (six) hours as needed (Nausea or vomiting). 09/06/18  Yes Brunetta Genera, MD  saccharomyces boulardii (FLORASTOR) 250 MG capsule  Take 1 capsule (250 mg total) by mouth 2 (two) times daily. 09/03/18  Yes Georgette Shell, MD  SYMBICORT 160-4.5 MCG/ACT inhaler Inhale 2 puffs into the lungs 2 (two) times daily. Patient taking differently: Inhale 2 puffs into the lungs 2 (two) times daily.  08/05/18  Yes Valentina Shaggy, MD  vitamin B-12 1000 MCG tablet Take 1 tablet (1,000 mcg total) by mouth daily. 09/04/18  Yes Georgette Shell, MD  cyclobenzaprine (FLEXERIL) 10 MG tablet Take 10  mg by mouth 3 (three) times daily as needed (muscle spasms.).     [provider]  dexamethasone (DECADRON) 4 MG tablet Take 2 tablets (8 mg total) by mouth daily. Start the day after chemotherapy for 2 days. Take with food. 09/06/18   Brunetta Genera, MD  diazepam (VALIUM) 5 MG tablet Take 5 mg by mouth daily as needed for anxiety (panic attacks).     Nolene Ebbs, MD  diclofenac sodium (VOLTAREN) 1 % GEL Apply 2 g topically 4 (four) times daily as needed (for back/knee pain.).     [provider]  lidocaine-prilocaine (EMLA) cream Apply to affected area once 09/06/18   Brunetta Genera, MD  methocarbamol (ROBAXIN) 750 MG tablet Take 750 mg by mouth every 8 (eight) hours as needed (back spasms.).     [provider]  nystatin (NYSTATIN) powder Apply 1 g topically 2 (two) times daily as needed (irritation). Use in skin folds and under breast     Bovard-Stuckert, Jody, MD  nystatin-triamcinolone (MYCOLOG II) cream Apply 1 application topically 2 (two) times daily as needed (irritation). Use in skin folds and under breast     Bovard-Stuckert, Jody, MD  ondansetron (ZOFRAN) 8 MG tablet Take 1 tablet (8 mg total) by mouth 2 (two) times daily as needed for refractory nausea / vomiting. Start on day 3 after chemotherapy. 09/06/18   Brunetta Genera, MD  tiZANidine (ZANAFLEX) 4 MG tablet Take 4 mg by mouth every 8 (eight) hours as needed for spasms.    [provider]  traMADol (ULTRAM) 50 MG tablet Take 50 mg by mouth every 6 (six) hours as needed for moderate pain (back pain.).     Nolene Ebbs, MD  triamcinolone (NASACORT) 55 MCG/ACT AERO nasal inhaler Place 2 sprays into the nose daily as needed (for allergies). 07/06/18   Valentina Shaggy, MD  valACYclovir (VALTREX) 500 MG tablet Take 500 mg by mouth 2 (two) times daily as needed (for cold sores).     [provider]     Vital Signs: BP (!) 176/94 (BP Location: Left Arm)   Pulse 88    Temp 97.9 F (36.6 C) (Oral)   Resp 18   SpO2 100%   Physical Exam awake, alert.  Chest clear to auscultation bilaterally.  Heart with regular rate and rhythm.  Abdomen soft, obese, positive bowel sounds, currently nontender.  Extremities with full range of motion.  Imaging: No results found.  Labs:  CBC: Recent Labs    09/02/18 1039 09/03/18 0425 09/06/18 1414 09/14/18 0900  WBC 9.0 10.9* 9.3 8.4  HGB 8.6* 9.4* 9.1* 10.3*  HCT 29.6* 32.4* 30.4* 35.7*  PLT 270 337 434* 483*    COAGS: Recent Labs    07/22/18 1139 08/27/18 0951 08/27/18 1557 09/14/18 0900  INR 1.06 1.01 1.09 1.06  APTT 39* 38*  --   --     BMP: Recent Labs    08/30/18 0344 08/31/18 0357 09/01/18 0413 09/06/18 1414  NA 141 141 141 145  K 3.7 3.7 3.4* 3.4*  CL 108 106 105 109  CO2 26 27 27 28   GLUCOSE 100* 107* 95 89  BUN 12 11 13 6   CALCIUM 8.0* 8.2* 7.9* 8.8*  CREATININE 1.01* 0.99 0.91 0.88  GFRNONAA >60 >60 >60 >60  GFRAA >60 >60 >60 >60    LIVER FUNCTION TESTS: Recent Labs    08/27/18 1557 08/29/18 0343 09/01/18 0413 09/06/18 1414  BILITOT 0.4 0.6 0.7 0.5  AST 35 27 24 15   ALT 19 17 20 17   ALKPHOS 77 71 76 90  PROT 6.0* 6.6 6.1* 7.3  ALBUMIN 2.6* 2.8* 2.2* 2.6*    Assessment and Plan: Patient with history of newly diagnosed metastatic adenocarcinoma of probable GI origin.  Recently underwent liver lesion biopsy which was complicated by subcapsular hematoma/hemorrhage and subsequent hospital admission; now presents  for outpatient Port-A-Cath placement for planned chemotherapy.Risks and benefits of image guided port-a-catheter placement was discussed with the patient/daughter including, but not limited to bleeding, infection, pneumothorax, or fibrin sheath development and need for additional procedures.  All of the patient's questions were answered, patient is agreeable to proceed. Consent signed and in chart.     Electronically Signed: D. Rowe Robert, PA-C 09/14/2018,  10:00 AM   I spent a total of 20 minutes at the the patient's bedside AND on the patient's hospital floor or unit, greater than 50% of which was counseling/coordinating care for Port-A-Cath placement

## 2018-09-17 ENCOUNTER — Inpatient Hospital Stay: Payer: Medicaid Other | Attending: Hematology

## 2018-09-17 ENCOUNTER — Inpatient Hospital Stay (HOSPITAL_BASED_OUTPATIENT_CLINIC_OR_DEPARTMENT_OTHER): Payer: Medicaid Other | Admitting: Hematology

## 2018-09-17 ENCOUNTER — Inpatient Hospital Stay: Payer: Medicaid Other

## 2018-09-17 ENCOUNTER — Encounter: Payer: Self-pay | Admitting: Hematology

## 2018-09-17 ENCOUNTER — Telehealth: Payer: Self-pay

## 2018-09-17 VITALS — BP 189/103 | HR 91 | Temp 97.9°F | Resp 18 | Ht 70.0 in | Wt 359.0 lb

## 2018-09-17 DIAGNOSIS — I10 Essential (primary) hypertension: Secondary | ICD-10-CM | POA: Diagnosis not present

## 2018-09-17 DIAGNOSIS — E538 Deficiency of other specified B group vitamins: Secondary | ICD-10-CM

## 2018-09-17 DIAGNOSIS — Z5111 Encounter for antineoplastic chemotherapy: Secondary | ICD-10-CM | POA: Insufficient documentation

## 2018-09-17 DIAGNOSIS — I272 Pulmonary hypertension, unspecified: Secondary | ICD-10-CM | POA: Insufficient documentation

## 2018-09-17 DIAGNOSIS — C787 Secondary malignant neoplasm of liver and intrahepatic bile duct: Secondary | ICD-10-CM

## 2018-09-17 DIAGNOSIS — D509 Iron deficiency anemia, unspecified: Secondary | ICD-10-CM | POA: Insufficient documentation

## 2018-09-17 DIAGNOSIS — C189 Malignant neoplasm of colon, unspecified: Secondary | ICD-10-CM | POA: Diagnosis not present

## 2018-09-17 DIAGNOSIS — I2510 Atherosclerotic heart disease of native coronary artery without angina pectoris: Secondary | ICD-10-CM

## 2018-09-17 DIAGNOSIS — Z87891 Personal history of nicotine dependence: Secondary | ICD-10-CM

## 2018-09-17 DIAGNOSIS — D709 Neutropenia, unspecified: Secondary | ICD-10-CM | POA: Insufficient documentation

## 2018-09-17 DIAGNOSIS — C180 Malignant neoplasm of cecum: Secondary | ICD-10-CM

## 2018-09-17 DIAGNOSIS — C269 Malignant neoplasm of ill-defined sites within the digestive system: Secondary | ICD-10-CM | POA: Diagnosis not present

## 2018-09-17 LAB — URINALYSIS, COMPLETE (UACMP) WITH MICROSCOPIC
BILIRUBIN URINE: NEGATIVE
Glucose, UA: NEGATIVE mg/dL
KETONES UR: NEGATIVE mg/dL
Leukocytes, UA: NEGATIVE
Nitrite: NEGATIVE
Protein, ur: NEGATIVE mg/dL
Specific Gravity, Urine: 1.015 (ref 1.005–1.030)
pH: 5 (ref 5.0–8.0)

## 2018-09-17 LAB — CBC WITH DIFFERENTIAL/PLATELET
ABS IMMATURE GRANULOCYTES: 0.02 10*3/uL (ref 0.00–0.07)
Basophils Absolute: 0 10*3/uL (ref 0.0–0.1)
Basophils Relative: 0 %
Eosinophils Absolute: 0.1 10*3/uL (ref 0.0–0.5)
Eosinophils Relative: 1 %
HCT: 37.5 % (ref 36.0–46.0)
Hemoglobin: 10.7 g/dL — ABNORMAL LOW (ref 12.0–15.0)
IMMATURE GRANULOCYTES: 0 %
Lymphocytes Relative: 22 %
Lymphs Abs: 1.5 10*3/uL (ref 0.7–4.0)
MCH: 27.5 pg (ref 26.0–34.0)
MCHC: 28.5 g/dL — ABNORMAL LOW (ref 30.0–36.0)
MCV: 96.4 fL (ref 80.0–100.0)
MONO ABS: 0.4 10*3/uL (ref 0.1–1.0)
MONOS PCT: 6 %
NEUTROS PCT: 71 %
Neutro Abs: 4.7 10*3/uL (ref 1.7–7.7)
Platelets: 430 10*3/uL — ABNORMAL HIGH (ref 150–400)
RBC: 3.89 MIL/uL (ref 3.87–5.11)
RDW: 15.9 % — ABNORMAL HIGH (ref 11.5–15.5)
WBC: 6.7 10*3/uL (ref 4.0–10.5)
nRBC: 0 % (ref 0.0–0.2)

## 2018-09-17 LAB — CMP (CANCER CENTER ONLY)
ALT: 8 U/L (ref 0–44)
AST: 12 U/L — AB (ref 15–41)
Albumin: 3 g/dL — ABNORMAL LOW (ref 3.5–5.0)
Alkaline Phosphatase: 88 U/L (ref 38–126)
Anion gap: 8 (ref 5–15)
BUN: 10 mg/dL (ref 6–20)
CO2: 28 mmol/L (ref 22–32)
Calcium: 9.3 mg/dL (ref 8.9–10.3)
Chloride: 108 mmol/L (ref 98–111)
Creatinine: 0.96 mg/dL (ref 0.44–1.00)
GFR, Est AFR Am: >60 mL/min (ref >60)
GFR, Estimated: >60 mL/min (ref >60)
Glucose, Bld: 93 mg/dL (ref 70–99)
Potassium: 4.1 mmol/L (ref 3.5–5.1)
Sodium: 144 mmol/L (ref 135–145)
TOTAL PROTEIN: 7.8 g/dL (ref 6.5–8.1)
Total Bilirubin: 0.4 mg/dL (ref 0.3–1.2)

## 2018-09-17 LAB — CEA (IN HOUSE-CHCC): CEA (CHCC-In House): 2.39 ng/mL (ref 0.00–5.00)

## 2018-09-17 NOTE — Patient Instructions (Signed)
Thank you for choosing [facility] to provide your oncology and hematology care.  To afford each patient quality time with our providers, please arrive 30 minutes before your scheduled appointment time.  If you arrive late for your appointment, you may be asked to reschedule.  We strive to give you quality time with our providers, and arriving late affects you and other patients whose appointments are after yours.    If you are a no show for multiple scheduled visits, you may be dismissed from the clinic at the providers discretion.     Again, thank you for choosing [facility], our hope is that these requests will decrease the amount of time that you wait before being seen by our physicians.  ______________________________________________________________________   Should you have questions after your visit to the  Cancer Center, please contact our office at [phone] between the hours of 8:30 and 4:30 p.m.    Voicemails left after 4:30p.m will not be returned until the following business day.     For prescription refill requests, please have your pharmacy contact us directly.  Please also try to allow 48 hours for prescription requests.     Please contact the scheduling department for questions regarding scheduling.  For scheduling of procedures such as PET scans, CT scans, MRI, Ultrasound, etc please contact central scheduling at [phone].     Resources For Cancer Patients and Caregivers:    Oncolink.org:  A wonderful resource for patients and healthcare providers for information regarding your disease, ways to tract your treatment, what to expect, etc.      American Cancer Society:  800-227-2345  Can help patients locate various types of support and financial assistance   Cancer Care: 1-800-813-HOPE (4673) Provides financial assistance, online support groups, medication/co-pay assistance.     [place] DSS:  336-641-3447 Where to apply  for food stamps, Medicaid, and utility assistance   Medicare Rights Center: 800-333-4114 Helps people with Medicare understand their rights and benefits, navigate the Medicare system, and secure the quality healthcare they deserve   SCAT: 336-333-6589 [place] [employer]'s shared-ride transportation service for eligible riders who have a disability that prevents them from riding the fixed route bus.     For additional information on assistance programs please contact our social worker:   Abigail Elmore:  [phone]  

## 2018-09-17 NOTE — Progress Notes (Signed)
Met with patient to introduce myself as Arboriculturist and to offer available resources. Patient has Medicaid therefore no copay assistance is needed.  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 She was able to email proof of her income.  Approved patient for the grant. Gave her a copy of the approval letter as well as the expense sheet and Outpatient pharmacy information. She verbalized understanding. She received a gift card today from grant.  She has my card for any additional financial questions or concerns.

## 2018-09-17 NOTE — Progress Notes (Signed)
HEMATOLOGY/ONCOLOGY CONSULTATION NOTE  Date of Service: 09/17/2018  Patient Care Team: Nolene Ebbs, MD as PCP - General (Internal Medicine) Jackelyn Knife, MD as Rounding Team (Internal Medicine)  CHIEF COMPLAINTS/PURPOSE OF CONSULTATION:  Newly diagnosed Metastatic Adenocarcinoma of GI Primary  HISTORY OF PRESENTING ILLNESS:   Margaret [name] is a wonderful 48 y.o. female who has been referred to [place] by Dr. Nolene Ebbs for evaluation and management of Iron deficiency anemia. The pt reports that she is doing well overall.   The pt recently presented to the ED on 04/16/18 for right sided abdominal pain that was evaluated with a CT A/P which revealed liver and pancreatic cyst, recommended for outpatient MRI follow up. The pt was found to have a UTI, was treated with Rocephin and discharged with Keflex. Prior to this the pt was admitted on 04/09/18 for symptomatic anemia, presenting with HGB at 6.2, received 2 units PRBCs, one IV Iron infusion, and was encouraged to seek out GI as outpatient.  She has an appointment with [facility] on 05/19/18.   The pt reports that she is still feeling dizzy and light headed and denies feeling better after her recent blood transfusion. She notes that she dizziness presents with a feeling of warmness and that she begins hyperventilating, feeling nervous about her dizziness.  She notes that prior to her recent hospital stay, she never required a blood transfusion or IV iron replacement. She notes that as a child she had frequent nose bleeds, and was diagnosed with anemia. She also notes a history of ice picca.   She had a hysterectomy in 2012, and prior to this she had very heavy periods that began at age 50 and occurred almost constantly. She was on Depo and Mirena which did not help slow menstrual losses. Besides taking iron supplements while pregnant she has not taken PO iron replacement as she reports not tolerating it very well while pregnant.   She  denies concern for black stools or blood in the stools and has not had a colonoscopy or endoscopy before. The pt notes that she has not previously had a concern for stomach ulcers, but has acid reflux and takes Prevacid as needed for 7-8 years. She notes that she has not recently used Prevacid, for the last 3 months.  She is not taking vitamin replacements and denies any dietary restrictions.  The pt notes that she has been continuing to have lower right quadrant abdominal pain that radiates across her abdomen, presents for up to 45 minutes and occurs intermittently. She notes that her abdominal pain presented on 04/13/18.   The pt notes that was taking Ibuprofen 800mg  BID for 8 years to address her back pain related to her herniated disk. She stopped taking this 4 months ago.   Of note prior to the patient's visit today, pt has had CT A/P completed on 04/16/18 with results revealing No acute findings in the abdomen/pelvis. Least 4 low-density hepatic lesions, some of which may represent small cysts or hemangiomas, however there is a 19 mm lesion in the left lobe of the liver that is incompletely characterized. Recommend nonemergent hepatic protocol MRI for further evaluation.Marland Kitchen Possible 11 mm pancreatic nodule rising from the tail, alternatively this may represent a splenule. This can also be assessed at time of hepatic MRI. Gallstone without gallbladder inflammation. Colonic diverticulosis without diverticulitis.   Most recent lab results (04/16/18) of CBC is as follows: all values are WNL except for HGB at 9.5, HCT at  33.6, MCH at 23.8, MCHC at 28.3, RDW at 23.2, PLT at 435k. Ferritin 04/09/18 was low at 6 Vitamin B12 on 04/09/18 was at 196  On review of systems, pt reports light headedness, dizziness, lower back pain, intermittent abdominal pain, and denies black stools, blood in the stools, tingling or numbness in her legs or arms, vaginal bleeding, and any other symptoms.  Interval History:   Margaret [name] returns today for management and evaluation and treatment of her newly diagnosed Metastatic Adenocarcinoma of GI primary. The patient's last visit with [place] was on 09/06/18. The pt reports that she is doing well overall.   The pt reports that her port was placed without difficulty and denies any complaints related to this. She denies any new abdominal symptoms, but notes that her previous intermittent abdominal discomfort has continued. She notes that she has had some difficulty controlling her bladder, urinating when she stands up to try to walk to the restroom. She denies discomfort while urinating or pain.   The pt notes that she is trying to eat well and denies blood in the stools.   Lab results today (09/17/18) of CBC w/diff and CMP is as follows: all values are WNL except for HGB at 10.7, MCHC at 28.5, RDW at 15.9, PLT at 430k, Albumin at 3.0, AST at 12.  On review of systems, pt reports frequent urination, clear urine, some bladder incontinence, trying to eat well, intermittent abdominal discomfort, resolved ankle swelling, and denies problems with the port, painful urination, fevers, chills, changes in bowel habits, blood in the stools, new abdominal pains, and any other symptoms.   MEDICAL HISTORY:  Past Medical History:  Diagnosis Date   Allergic rhinitis    Anxiety diagnosed in 1990   Arthritis    Asthma    Dyspnea    with low blood count hx of anemia   Eczema    History of blood transfusion    Hypertension    Lower back pain    Migraine    Sleep apnea    uses CPAP    SURGICAL HISTORY: Past Surgical History:  Procedure Laterality Date   ABDOMINAL HYSTERECTOMY     cortisone injections     knees bilat and back    IR IMAGING GUIDED PORT INSERTION  09/14/2018   iron infusion     LIVER BIOPSY      SOCIAL HISTORY: Social History   Socioeconomic History   Marital status: Single    Spouse name: Not on file   Number of children: Not on file    Years of education: Not on file   Highest education level: Not on file  Occupational History   Not on file  Social Needs   Financial resource strain: Not on file   Food insecurity:    Worry: Not on file    Inability: Not on file   Transportation needs:    Medical: Not on file    Non-medical: Not on file  Tobacco Use   Smoking status: Former Smoker    Last attempt to quit: 07/30/1999    Years since quitting: 19.1   Smokeless tobacco: Never Used  Substance and Sexual Activity   Alcohol use: No   Drug use: No   Sexual activity: Not on file  Lifestyle   Physical activity:    Days per week: Not on file    Minutes per session: Not on file   Stress: Not on file  Relationships   Social connections:  Talks on phone: Not on file    Gets together: Not on file    Attends religious service: Not on file    Active member of club or organization: Not on file    Attends meetings of clubs or organizations: Not on file    Relationship status: Not on file   Intimate partner violence:    Fear of current or ex partner: Not on file    Emotionally abused: Not on file    Physically abused: Not on file    Forced sexual activity: Not on file  Other Topics Concern   Not on file  Social History Narrative   Not on file    FAMILY HISTORY: History reviewed. No pertinent family history.  ALLERGIES:  is allergic to latex; tape; and tylenol with codeine #3 [acetaminophen-codeine].  MEDICATIONS:  Current Outpatient Medications  Medication Sig Dispense Refill   albuterol (PROAIR HFA) 108 (90 Base) MCG/ACT inhaler INHALE TWO puffs BY MOUTH into the lungs EVERY 4 HOURS AS NEEDED FOR WHEEZING OR shortness of breath 18 g 1   amoxicillin-clavulanate (AUGMENTIN) 875-125 MG tablet Take 1 tablet by mouth every 12 (twelve) hours. 10 tablet 0   Boric Acid 4 % SOLN Place 1 capsule vaginally every Friday.      cetirizine (ZYRTEC) 10 MG tablet Take 1 tablet (10 mg total) by mouth daily.  30 tablet 6   clobetasol ointment (TEMOVATE) 3.71 % Apply 1 application topically 2 (two) times daily. Use for Lichen Sclerosis     Cyanocobalamin (B-12) 1000 MCG SUBL Place 1,000 mcg under the tongue daily. 30 each 3   cyclobenzaprine (FLEXERIL) 10 MG tablet Take 10 mg by mouth 3 (three) times daily as needed (muscle spasms.).      dexamethasone (DECADRON) 4 MG tablet Take 2 tablets (8 mg total) by mouth daily. Start the day after chemotherapy for 2 days. Take with food. 30 tablet 1   diazepam (VALIUM) 5 MG tablet Take 5 mg by mouth daily as needed for anxiety (panic attacks).      diclofenac sodium (VOLTAREN) 1 % GEL Apply 2 g topically 4 (four) times daily as needed (for back/knee pain.).      dicyclomine (BENTYL) 20 MG tablet Take 1 tablet (20 mg total) by mouth 2 (two) times daily. (Patient taking differently: Take 20 mg by mouth 3 (three) times daily before meals. ) 20 tablet 0   ergocalciferol (VITAMIN D2) 50000 UNITS capsule Take 50,000 Units by mouth every Monday.      lidocaine-prilocaine (EMLA) cream Apply to affected area once 30 g 3   lisinopril-hydrochlorothiazide (PRINZIDE,ZESTORETIC) 20-12.5 MG tablet Take 1 tablet by mouth daily.     methocarbamol (ROBAXIN) 750 MG tablet Take 750 mg by mouth every 8 (eight) hours as needed (back spasms.).      montelukast (SINGULAIR) 10 MG tablet Take 1 tablet (10 mg total) by mouth at bedtime. 30 tablet 6   nystatin (NYSTATIN) powder Apply 1 g topically 2 (two) times daily as needed (irritation). Use in skin folds and under breast      nystatin-triamcinolone (MYCOLOG II) cream Apply 1 application topically 2 (two) times daily as needed (irritation). Use in skin folds and under breast      ondansetron (ZOFRAN) 8 MG tablet Take 1 tablet (8 mg total) by mouth 2 (two) times daily as needed for refractory nausea / vomiting. Start on day 3 after chemotherapy. 30 tablet 1   pantoprazole (PROTONIX) 40 MG tablet Take 40 mg by  mouth daily  before breakfast.   1   PATADAY 0.2 % SOLN Apply 2 drops to eye 2 (two) times daily as needed (for irritated eyes.). 2.5 mL 5   pimecrolimus (ELIDEL) 1 % cream Apply 1 application topically 2 (two) times daily. 30 g 6   prochlorperazine (COMPAZINE) 10 MG tablet Take 1 tablet (10 mg total) by mouth every 6 (six) hours as needed (Nausea or vomiting). 30 tablet 1   saccharomyces boulardii (FLORASTOR) 250 MG capsule Take 1 capsule (250 mg total) by mouth 2 (two) times daily.     SYMBICORT 160-4.5 MCG/ACT inhaler Inhale 2 puffs into the lungs 2 (two) times daily. (Patient taking differently: Inhale 2 puffs into the lungs 2 (two) times daily. ) 10.2 g 3   tiZANidine (ZANAFLEX) 4 MG tablet Take 4 mg by mouth every 8 (eight) hours as needed for spasms.     traMADol (ULTRAM) 50 MG tablet Take 50 mg by mouth every 6 (six) hours as needed for moderate pain (back pain.).      triamcinolone (NASACORT) 55 MCG/ACT AERO nasal inhaler Place 2 sprays into the nose daily as needed (for allergies). 1 Inhaler 3   valACYclovir (VALTREX) 500 MG tablet Take 500 mg by mouth 2 (two) times daily as needed (for cold sores).      vitamin B-12 1000 MCG tablet Take 1 tablet (1,000 mcg total) by mouth daily.     No current facility-administered medications for this visit.     REVIEW OF SYSTEMS:    A 10+ POINT REVIEW OF SYSTEMS WAS OBTAINED including neurology, dermatology, psychiatry, cardiac, respiratory, lymph, extremities, GI, GU, Musculoskeletal, constitutional, breasts, reproductive, HEENT.  All pertinent positives are noted in the HPI.  All others are negative.   PHYSICAL EXAMINATION:  . Vitals:   09/17/18 1058  BP: (!) 189/103  Pulse: 91  Resp: 18  Temp: 97.9 F (36.6 C)  SpO2: 100%   Filed Weights   09/17/18 1058  Weight: (!) 359 lb (162.8 kg)   .Body mass index is 51.51 kg/m.  GENERAL:alert, in no acute distress and comfortable SKIN: no acute rashes, no significant lesions EYES: conjunctiva  are pink and non-injected, sclera anicteric OROPHARYNX: MMM, no exudates, no oropharyngeal erythema or ulceration NECK: supple, no JVD LYMPH:  no palpable lymphadenopathy in the cervical, axillary or inguinal regions LUNGS: clear to auscultation b/l with normal respiratory effort HEART: regular rate & rhythm ABDOMEN:  normoactive bowel sounds , non tender, not distended. No palpable hepatosplenomegaly.  Extremity: no pedal edema PSYCH: alert & oriented x 3 with fluent speech NEURO: no focal motor/sensory deficits   LABORATORY DATA:  I have reviewed the data as listed  . CBC Latest Ref Rng & Units 09/17/2018 09/14/2018 09/06/2018  WBC 4.0 - 10.5 K/uL 6.7 8.4 9.3  Hemoglobin 12.0 - 15.0 g/dL 10.7(L) 10.3(L) 9.1(L)  Hematocrit 36.0 - 46.0 % 37.5 35.7(L) 30.4(L)  Platelets 150 - 400 K/uL 430(H) 483(H) 434(H)    . CMP Latest Ref Rng & Units 09/17/2018 09/06/2018 09/01/2018  Glucose 70 - 99 mg/dL 93 89 95  BUN 6 - 20 mg/dL 10 6 13   Creatinine 0.44 - 1.00 mg/dL 0.96 0.88 0.91  Sodium 135 - 145 mmol/L 144 145 141  Potassium 3.5 - 5.1 mmol/L 4.1 3.4(L) 3.4(L)  Chloride 98 - 111 mmol/L 108 109 105  CO2 22 - 32 mmol/L 28 28 27   Calcium 8.9 - 10.3 mg/dL 9.3 8.8(L) 7.9(L)  Total Protein 6.5 - 8.1 g/dL 7.8  7.3 6.1(L)  Total Bilirubin 0.3 - 1.2 mg/dL 0.4 0.5 0.7  Alkaline Phos 38 - 126 U/L 88 90 76  AST 15 - 41 U/L 12(L) 15 24  ALT 0 - 44 U/L 8 17 20    . Lab Results  Component Value Date   IRON 24 (L) 09/06/2018   TIBC 156 (L) 09/06/2018   IRONPCTSAT 15 (L) 09/06/2018   (Iron and TIBC)  Lab Results  Component Value Date   FERRITIN 658 (H) 09/06/2018   B12  - 196--> 584  07/22/18 Liver Biopsy:   08/27/18 Repeat Liver Biopsy:     RADIOGRAPHIC STUDIES: I have personally reviewed the radiological images as listed and agreed with the findings in the report. Ct Abdomen Wo Contrast  Result Date: 08/27/2018 CLINICAL DATA:  Status post CT-guided biopsy of lesion in segment VII of  the right lobe of the liver Faythe Dingwall today under CT guidance. Significant abdominal pain during recovery. EXAM: CT ABDOMEN WITHOUT CONTRAST TECHNIQUE: Multidetector CT imaging of the abdomen was performed following the standard protocol without IV contrast. COMPARISON:  Imaging during liver biopsy earlier today as well as prior CT of the abdomen on 04/16/2018. FINDINGS: Lower chest: Stable scarring in the right lower lung adjacent to the major fissure. Hepatobiliary: Acute subcapsular and perihepatic hemorrhage is present after biopsy, primarily inferior to the right lobe of the liver. Lateral subcapsular component of hemorrhage adjacent to the right lobe measures approximately 1.4-1.5 cm in greatest thickness. Most of the acute hemorrhage is inferior to the right lobe where predominately subcapsular component measures approximately 7 x 10 x 12 cm. There is some extension of blood inferiorly along the right pericolic gutter and into the pelvis. Mixed density is present in the hematoma just posterior to the right lobe inferiorly. Presence of active bleeding can not be determined without administration of IV contrast. No evidence of biliary ductal dilatation. At least 1 gallstone is present in the gallbladder. Pancreas: Unremarkable. No pancreatic ductal dilatation or surrounding inflammatory changes. Spleen: Normal in size without focal abnormality. Adrenals/Urinary Tract: Adrenal glands are unremarkable. Kidneys are normal, without renal calculi, focal lesion, or hydronephrosis. Stomach/Bowel: Visualized bowel is unremarkable. No ileus, obstruction or inflammation. No free air. Vascular/Lymphatic: No enlarged lymph nodes identified. Other: There is a moderate hiatal hernia. Musculoskeletal: No acute or significant osseous findings. IMPRESSION: Acute hemorrhage after liver biopsy consisting of a thin lateral subcapsular hemorrhage adjacent to the right lobe of the liver and a larger infrahepatic hematoma which is largely  subcapsular but with some inferior extension along the right pericolic gutter into the pelvis. Overall amount of hemorrhage is moderate, likely in the 400-450 mL range based on volume estimate. These results were called by telephone at the time of interpretation on 08/27/2018 at 4:15 pm to [facility], PA-C, who verbally acknowledged these results. Electronically Signed   By: Aletta Edouard M.D.   On: 08/27/2018 16:21   Ct Biopsy  Result Date: 08/27/2018 INDICATION: 48 year old female with multifocal hypermetabolic liver lesions and a probable colonic mass concerning for colon cancer metastatic to the liver. Prior ultrasound-guided biopsy to attempt was nondiagnostic. Patient presents for attempted CT-guided biopsy. EXAM: CT BIOPSY MEDICATIONS: None. ANESTHESIA/SEDATION: Moderate (conscious) sedation was employed during this procedure. A total of Versed 2 mg and Fentanyl 100 mcg was administered intravenously. Moderate Sedation Time: 13 minutes. The patient's level of consciousness and vital signs were monitored continuously by radiology nursing throughout the procedure under my direct supervision. FLUOROSCOPY TIME:  None COMPLICATIONS: None  immediate. PROCEDURE: Informed written consent was obtained from the patient after a thorough discussion of the procedural risks, benefits and alternatives. All questions were addressed. A timeout was performed prior to the initiation of the procedure. A planning axial CT scan was performed. Hypoattenuating liver lesion in hepatic segment 7 was successfully identified. A suitable skin entry site was selected and marked. The region was sterilely prepped and draped in standard fashion using chlorhexidine skin prep. Local anesthesia was attained by infiltration with 1% lidocaine. A small dermatotomy was made. Under intermittent CT guidance, a 20 cm 17 gauge introducer needle was carefully advanced through the liver and positioned at the margin of the hypoechoic mass.  Multiple 18 gauge core biopsies were then coaxially obtained using the bio Pince automated biopsy device. Biopsy specimens were placed in formalin and delivered to pathology for further analysis. As the introducer needle was removed, the biopsy tract was embolized with a Gel-Foam slurry. The patient tolerated the procedure well. IMPRESSION: Technically successful CT-guided core biopsy of hepatic lesion. Electronically Signed   By: Jacqulynn Cadet M.D.   On: 08/27/2018 12:41   Dg Abd Acute W/chest  Result Date: 08/29/2018 CLINICAL DATA:  Liver biopsy 2 days ago. Right upper quadrant abdominal pain. EXAM: DG ABDOMEN ACUTE W/ 1V CHEST COMPARISON:  CT 08/27/2018.  Chest radiographs 07/06/2018. FINDINGS: 1137 hours. Significantly lower lung volumes with new bibasilar airspace opacities, probably atelectasis. No pneumothorax or large pleural effusion identified. The heart size and mediastinal contours are stable for technique. No acute osseous findings evident. The bowel gas pattern is normal. There is no free intraperitoneal air. IMPRESSION: New bibasilar airspace opacities likely represent atelectasis given the significantly lower lung volumes. Aspiration and inflammation could have this appearance. No acute abdominal findings identified. Electronically Signed   By: Richardean Sale M.D.   On: 08/29/2018 13:02   US Thyroid  Result Date: 08/20/2018 CLINICAL DATA:  Incidental on PET. EXAM: THYROID ULTRASOUND TECHNIQUE: Ultrasound examination of the thyroid gland and adjacent soft tissues was performed. COMPARISON:  None. FINDINGS: Parenchymal Echotexture: Moderately heterogenous Isthmus: 0.4 cm Right lobe: 6.6 x 3.9 x 4.4 cm Left lobe: 5.5 x 1.6 x 2.5 cm _________________________________________________________ Estimated total number of nodules >/= 1 cm: 4 Number of spongiform nodules >/=  2 cm not described below (TR1): 0 Number of mixed cystic and solid nodules >/= 1.5 cm not described below (TR2): 0  _________________________________________________________ Nodule # 1: Location: Right; Mid Maximum size: 5.6 cm; Other 2 dimensions: 3.6 x 3.4 cm Composition: mixed cystic and solid (1) Echogenicity: isoechoic (1) Shape: not taller-than-wide (0) Margins: smooth (0) Echogenic foci: none (0) ACR TI-RADS total points: 2. ACR TI-RADS risk category: TR2 (2 points). ACR TI-RADS recommendations: This nodule does NOT meet TI-RADS criteria for biopsy or dedicated follow-up. _________________________________________________________ Nodule # 4: Location: Left; Inferior Maximum size: 2.1 cm; Other 2 dimensions: 1.9 x 1.7 cm Composition: solid/almost completely solid (2) Echogenicity: hypoechoic (2) Shape: not taller-than-wide (0) Margins: ill-defined (0) Echogenic foci: macrocalcifications (1) ACR TI-RADS total points: 5. ACR TI-RADS risk category: TR4 (4-6 points). ACR TI-RADS recommendations: **Given size (>/= 1.5 cm) and appearance, fine needle aspiration of this moderately suspicious nodule should be considered based on TI-RADS criteria. _________________________________________________________ Left nodules 2 and 3 are isoechoic and do not meet criteria for biopsy nor follow-up. IMPRESSION: Large complex solid and cystic nodule in the right lobe does not meet criteria for biopsy nor follow-up. Left nodule 4 meets criteria for fine needle aspiration biopsy. The above is  in keeping with the ACR TI-RADS recommendations - J Am Coll Radiol 2017;14:587-595. Electronically Signed   By: Marybelle Killings M.D.   On: 08/20/2018 14:32   Ir Imaging Guided Port Insertion  Result Date: 09/14/2018 CLINICAL DATA:  Cecal carcinoma. Needs durable venous access for chemotherapy regimen. EXAM: TUNNELED PORT CATHETER PLACEMENT WITH ULTRASOUND AND FLUOROSCOPIC GUIDANCE FLUOROSCOPY TIME:  0.2 minute; 167  uGym2 DAP ANESTHESIA/SEDATION: Intravenous Fentanyl and Versed were administered as conscious sedation during continuous monitoring of the  patient's level of consciousness and physiological / cardiorespiratory status by the radiology RN, with a total moderate sedation time of 17 minutes. TECHNIQUE: The procedure, risks, benefits, and alternatives were explained to the patient. Questions regarding the procedure were encouraged and answered. The patient understands and consents to the procedure. As antibiotic prophylaxis, cefazolin 2 g was ordered pre-procedure and administered intravenously within one hour of incision. Patency of the right IJ vein was confirmed with ultrasound with image documentation. An appropriate skin site was determined. Skin site was marked. Region was prepped using maximum barrier technique including cap and mask, sterile gown, sterile gloves, large sterile sheet, and Chlorhexidine as cutaneous antisepsis. The region was infiltrated locally with 1% lidocaine. Under real-time ultrasound guidance, the right IJ vein was accessed with a 21 gauge micropuncture needle; the needle tip within the vein was confirmed with ultrasound image documentation. Needle was exchanged over a 018 guidewire for transitional dilator which allowed passage of the [facility] wire into the IVC. Over this, the transitional dilator was exchanged for a 5 [place] MPA catheter. A small incision was made on the right anterior chest wall and a subcutaneous pocket fashioned. The power-injectable port was positioned and its catheter tunneled to the right IJ dermatotomy site. The MPA catheter was exchanged over an Amplatz wire for a peel-away sheath, through which the port catheter, which had been trimmed to the appropriate length, was advanced and positioned under fluoroscopy with its tip at the cavoatrial junction. Spot chest radiograph confirms good catheter position and no pneumothorax. The pocket was closed with deep interrupted and subcuticular continuous 3-0 Monocryl sutures. The port was flushed per protocol. The incisions were covered with Dermabond then covered  with a sterile dressing. COMPLICATIONS: COMPLICATIONS None immediate IMPRESSION: Technically successful right IJ power-injectable port catheter placement. Ready for routine use. Electronically Signed   By: Lucrezia Europe M.D.   On: 09/14/2018 16:55   Ct Angio Abd/pel W/ And/or W/o  Result Date: 08/27/2018 CLINICAL DATA:  48 year old female with perihepatic hemorrhage status post CT-guided liver lesion biopsy. Evaluate for pseudoaneurysm or persistent bleeding. EXAM: CT ANGIOGRAPHY ABDOMEN AND PELVIS WITH CONTRAST AND WITHOUT CONTRAST TECHNIQUE: Multidetector CT imaging of the abdomen and pelvis was performed using the standard protocol during bolus administration of intravenous contrast. Multiplanar reconstructed images and MIPs were obtained and reviewed to evaluate the vascular anatomy. CONTRAST:  174mL [id number] IOPAMIDOL ([id number]) INJECTION 76% COMPARISON:  None. FINDINGS: VASCULAR Aorta: Normal caliber aorta without aneurysm, dissection, vasculitis or significant stenosis. Celiac: Patent without evidence of aneurysm, dissection, vasculitis or significant stenosis. SMA: Patent without evidence of aneurysm, dissection, vasculitis or significant stenosis. Renals: Both renal arteries are patent without evidence of aneurysm, dissection, vasculitis, fibromuscular dysplasia or significant stenosis. IMA: Patent without evidence of aneurysm, dissection, vasculitis or significant stenosis. Inflow: Patent without evidence of aneurysm, dissection, vasculitis or significant stenosis. Proximal Outflow: Bilateral common femoral and visualized portions of the superficial and profunda femoral arteries are patent without evidence of aneurysm, dissection, vasculitis or significant stenosis. Veins:  No obvious venous abnormality within the limitations of this arterial phase study. Review of the MIP images confirms the above findings. NON-VASCULAR Lower chest: No acute abnormality. Hepatobiliary: Multiple hypoechoic hepatic  lesions consistent with known/suspected multifocal hepatic metastatic disease. There is a moderate subcapsular hematoma arising from the posteromedial aspect of hepatic segment 6. The volume of the collection has not substantially changed compared to the noncontrast imaging done earlier today. The volume of hemorrhage appears stable. No evidence of active extravasation on arterial or venous phase imaging. Cholelithiasis noted incidentally. Pancreas: Unremarkable. No pancreatic ductal dilatation or surrounding inflammatory changes. Spleen: Normal in size without focal abnormality. Adrenals/Urinary Tract: Adrenal glands are unremarkable. Kidneys are normal, without renal calculi, focal lesion, or hydronephrosis. Bladder is unremarkable. Stomach/Bowel: Diffuse cecal thickening again noted consistent with patient's suspected colon malignancy. No evidence of obstruction. Lymphatic: No suspicious lymphadenopathy. Reproductive: Surgical changes of prior hysterectomy. Other: Small volume hemoperitoneum tracking along the right pericolic gutter and into the pelvic recesses. Musculoskeletal: No acute osseous abnormality. IMPRESSION: VASCULAR 1. No evidence of arterial pseudoaneurysm or active bleeding. NON-VASCULAR 1. Stable subcapsular, perihepatic and intraperitoneal hemorrhage without evidence of continued bleeding. Electronically Signed   By: Jacqulynn Cadet M.D.   On: 08/27/2018 18:27    ASSESSMENT & PLAN:   48 y.o. female with  1. Iron Deficiency Anemia - ? Previous heavy periods vs GI losses  Recent heavy NSAID use ? Ulcer  2. B12 deficiency Labs upon initial presentation from 04/16/18, HGB at 9.5. The 04/09/18 Ferritin was low at 6 and Vitamin B12 was at 196 -No antiparietal antibodies, no intrinsic factor antibodies, normal TSH all from 04/28/18  PLAN: -Continue NSAID avoidance  -continue 1035mcg Vitamin B12 sublingually daily - B12 improved from 196 to 584  3.MetastaticAdenocarcinoma of GI  primary  04/16/18 CT A/P with pt revealed No acute findings in the abdomen/pelvis. Least 4 low-density hepatic lesions, some of which may represent small cysts or hemangiomas, however there is a 19 mm lesion in the left lobe of the liver that is incompletely characterized. Recommend nonemergent hepatic protocol MRI for further evaluation.Marland Kitchen Possible 11 mm pancreatic nodule rising from the tail, alternatively this may represent a splenule. This can also be assessed at time of hepatic MRI. Gallstone without gallbladder inflammation. Colonic diverticulosis without diverticulitis.   07/09/18 Tumor marker work up: CEA normal at 1.32, AFP normal at 1.5, LDH normal at 138, CA 125 normal at 5.9, CA 19-9 normal at <2   07/22/18 Liver biopsy results revealed benign hepatic tissue, and was not diagnostic   07/16/18 PET/CT revealedThere is a solid hypermetabolic mass involving the cecum, worrisome for colonic primary neoplasm. Correlation withcolonoscopy Findings. 2. Multiple hypermetabolic liver lesions compatible with metastaticdisease. 3. Large low-density lesion in right lobe of thyroid gland. Advise further evaluation with thyroid sonography. 4. Aortic Atherosclerosis. 5. Small hiatal hernia.   08/27/18 Liver biopsy revealed Metastatic adenocarcinoma, consistent with gastrointestinal primary   09/03/18 ECHO revealed an LV EF of 31-51%, mild diastolic dysfunction; mild RVE; dilated PA; mild TR; moderate to severe pulmonary hypertension   PLAN: -Discussed pt labwork today, 09/17/18; HGB improved to 10.7, Albumin improved to 3.0 -Will collect urinalysis today to rule out UTI - UCx neg -09/17/18 CEA is pending, will be the last CEA pre-treatment value  -The pt has no prohibitive toxicities from beginning C1 FOLFOX tomorrow, 09/18/18. -Reschedule Colonoscopy as soon as possible -Discussed that pt should avoid cold objects and fluids during treatment with FOLFOX -Will evaluate further treatment options as  the  pending genetic testing results  -Recommend saline sprays OTC to prevent further epistaxis  -Recommend soft foods that are easy to digest  -Recommended that the pt continue to eat well, drink at least 48-64 oz of water each day, and walk 20-30 minutes each day.  -Will see the pt back in 9 days for toxicity check   Return to lab today for urine sample F/u for C1 of FOLFOX as scheduled tomorrow RTC with Dr Irene Limbo with labs in 9 days for toxicity check Plz schedule C2 of FOLFOX as per orders with labs   All of the patients questions were answered with apparent satisfaction. The patient knows to call the clinic with any problems, questions or concerns.  The total time spent in the appt was 25 minutes and more than 50% was on counseling and direct patient cares.    Sullivan Lone MD MS [facility] Hematology/Oncology Physician [facility]  (Office):       [phone] (Work cell):  [phone] (Fax):           [phone]  09/17/2018 11:25 AM  I, Baldwin Jamaica, am acting as a scribe for Dr. Sullivan Lone.   .I have reviewed the above documentation for accuracy and completeness, and I agree with the above. Brunetta Genera MD

## 2018-09-17 NOTE — Telephone Encounter (Signed)
Printed avs and calender per 1/3 los. Irene Limbo said to use the appointments that are already scheduled for [facility]. He is awware of current care plan has treatments scheduled on Saturday's.

## 2018-09-18 ENCOUNTER — Inpatient Hospital Stay: Payer: Medicaid Other

## 2018-09-18 VITALS — BP 142/104 | HR 94 | Temp 98.2°F | Resp 16

## 2018-09-18 DIAGNOSIS — Z7189 Other specified counseling: Secondary | ICD-10-CM

## 2018-09-18 DIAGNOSIS — C189 Malignant neoplasm of colon, unspecified: Secondary | ICD-10-CM

## 2018-09-18 DIAGNOSIS — Z5111 Encounter for antineoplastic chemotherapy: Secondary | ICD-10-CM | POA: Diagnosis not present

## 2018-09-18 DIAGNOSIS — C787 Secondary malignant neoplasm of liver and intrahepatic bile duct: Principal | ICD-10-CM

## 2018-09-18 LAB — URINE CULTURE: Culture: <10000 — AB

## 2018-09-18 MED ORDER — OXALIPLATIN CHEMO INJECTION 100 MG/20ML
86.0000 mg/m2 | Freq: Once | INTRAVENOUS | Status: AC
  Administered 2018-09-18: 250 mg via INTRAVENOUS
  Filled 2018-09-18: qty 40

## 2018-09-18 MED ORDER — SODIUM CHLORIDE 0.9 % IV SOLN
2400.0000 mg/m2 | INTRAVENOUS | Status: DC
  Administered 2018-09-18: 6950 mg via INTRAVENOUS
  Filled 2018-09-18: qty 139

## 2018-09-18 MED ORDER — DEXAMETHASONE SODIUM PHOSPHATE 10 MG/ML IJ SOLN
INTRAMUSCULAR | Status: AC
  Filled 2018-09-18: qty 1

## 2018-09-18 MED ORDER — DEXTROSE 5 % IV SOLN
Freq: Once | INTRAVENOUS | Status: DC
  Filled 2018-09-18: qty 250

## 2018-09-18 MED ORDER — PALONOSETRON HCL INJECTION 0.25 MG/5ML
0.2500 mg | Freq: Once | INTRAVENOUS | Status: AC
  Administered 2018-09-18: 0.25 mg via INTRAVENOUS

## 2018-09-18 MED ORDER — FLUOROURACIL CHEMO INJECTION 2.5 GM/50ML
400.0000 mg/m2 | Freq: Once | INTRAVENOUS | Status: AC
  Administered 2018-09-18: 1150 mg via INTRAVENOUS
  Filled 2018-09-18: qty 23

## 2018-09-18 MED ORDER — LEUCOVORIN CALCIUM INJECTION 350 MG
400.0000 mg/m2 | Freq: Once | INTRAVENOUS | Status: AC
  Administered 2018-09-18: 1156 mg via INTRAVENOUS
  Filled 2018-09-18: qty 57.8

## 2018-09-18 MED ORDER — PALONOSETRON HCL INJECTION 0.25 MG/5ML
INTRAVENOUS | Status: AC
  Filled 2018-09-18: qty 5

## 2018-09-18 MED ORDER — DEXTROSE 5 % IV SOLN
Freq: Once | INTRAVENOUS | Status: AC
  Administered 2018-09-18: 08:00:00 via INTRAVENOUS
  Filled 2018-09-18: qty 250

## 2018-09-18 MED ORDER — DEXAMETHASONE SODIUM PHOSPHATE 10 MG/ML IJ SOLN
10.0000 mg | Freq: Once | INTRAMUSCULAR | Status: AC
  Administered 2018-09-18: 10 mg via INTRAVENOUS

## 2018-09-18 NOTE — Patient Instructions (Signed)
[facility] Discharge Instructions for Patients Receiving Chemotherapy  Today you received the following chemotherapy agents Oxaliplatin, Leucovorin, 5-FU. To help prevent nausea and vomiting after your treatment, we encourage you to take your nausea medication. DO NOT TAKE ZOFRAN FOR THREE DAYS AFTER TREATMENT.   If you develop nausea and vomiting that is not controlled by your nausea medication, call the clinic.   BELOW ARE SYMPTOMS THAT SHOULD BE REPORTED IMMEDIATELY:  *FEVER GREATER THAN 100.5 F  *CHILLS WITH OR WITHOUT FEVER  NAUSEA AND VOMITING THAT IS NOT CONTROLLED WITH YOUR NAUSEA MEDICATION  *UNUSUAL SHORTNESS OF BREATH  *UNUSUAL BRUISING OR BLEEDING  TENDERNESS IN MOUTH AND THROAT WITH OR WITHOUT PRESENCE OF ULCERS  *URINARY PROBLEMS  *BOWEL PROBLEMS  UNUSUAL RASH Items with * indicate a potential emergency and should be followed up as soon as possible.  Feel free to call the clinic should you have any questions or concerns. The clinic phone number is [phone].  Please show the CHEMO ALERT CARD at check-in to the Emergency Department and triage nurse.   

## 2018-09-18 NOTE — Progress Notes (Signed)
First time Oxaliplatin/ Leucovorin, 5FU treatment. Pt. Tolerated well. Returning on Monday.

## 2018-09-19 ENCOUNTER — Encounter: Payer: Self-pay | Admitting: Hematology

## 2018-09-20 ENCOUNTER — Inpatient Hospital Stay: Payer: Medicaid Other

## 2018-09-20 ENCOUNTER — Telehealth: Payer: Self-pay | Admitting: *Deleted

## 2018-09-20 VITALS — BP 172/95 | HR 72 | Temp 98.3°F | Resp 16

## 2018-09-20 DIAGNOSIS — C189 Malignant neoplasm of colon, unspecified: Secondary | ICD-10-CM

## 2018-09-20 DIAGNOSIS — Z7189 Other specified counseling: Secondary | ICD-10-CM

## 2018-09-20 DIAGNOSIS — Z5111 Encounter for antineoplastic chemotherapy: Secondary | ICD-10-CM | POA: Diagnosis not present

## 2018-09-20 DIAGNOSIS — C787 Secondary malignant neoplasm of liver and intrahepatic bile duct: Principal | ICD-10-CM

## 2018-09-20 MED ORDER — SODIUM CHLORIDE 0.9% FLUSH
10.0000 mL | INTRAVENOUS | Status: DC | PRN
  Administered 2018-09-20: 10 mL
  Filled 2018-09-20: qty 10

## 2018-09-20 MED ORDER — HEPARIN SOD (PORK) LOCK FLUSH 100 UNIT/ML IV SOLN
500.0000 [IU] | Freq: Once | INTRAVENOUS | Status: AC | PRN
  Administered 2018-09-20: 500 [IU]
  Filled 2018-09-20: qty 5

## 2018-09-20 NOTE — Telephone Encounter (Signed)
Patient called earlier, had noticed a small amount of blood under round spot Band-Aid over port (had pump d/c'd and port de-accessed today. Consulted Sandi Mealy, PA in [facility]. Mr. Satira Sark reviewed patient's chart/and recent labs. Per Mr. Tanner, patient was advised to fold a gauze pad and place over port/bandaid. Patient could also apply ice intermittently to site. Patient verbalized understanding. Contacted patient after 3 hours  to follow up. Patient stated she was applying ice intermittently as directed and had not observed any blood on gauze since application. Instructed to continue ice intermittently until bedtime and that she should remove bandage tomorrow. If bleeding of any small amount happens again, she is to contact office. In the event bleeding increases, go to ED. Patient verbalized understanding.

## 2018-09-23 ENCOUNTER — Telehealth: Payer: Self-pay | Admitting: *Deleted

## 2018-09-23 NOTE — Telephone Encounter (Signed)
Contacted by Koleen Nimrod from [facility] called. States patient is requesting personal care services. Needs a form filled out and signed by MD. Will fax form to office. States form to be returned by fax to [employer]. Form received.

## 2018-09-27 ENCOUNTER — Ambulatory Visit: Payer: Medicaid Other | Admitting: Hematology

## 2018-09-27 ENCOUNTER — Other Ambulatory Visit: Payer: Medicaid Other

## 2018-09-28 ENCOUNTER — Other Ambulatory Visit: Payer: Self-pay | Admitting: *Deleted

## 2018-09-28 DIAGNOSIS — C787 Secondary malignant neoplasm of liver and intrahepatic bile duct: Principal | ICD-10-CM

## 2018-09-28 DIAGNOSIS — C189 Malignant neoplasm of colon, unspecified: Secondary | ICD-10-CM

## 2018-09-28 NOTE — Progress Notes (Signed)
HEMATOLOGY/ONCOLOGY CONSULTATION NOTE  Date of Service: 09/29/2018  Patient Care Team: Nolene Ebbs, MD as PCP - General (Internal Medicine) Jackelyn Knife, MD as Rounding Team (Internal Medicine)  CHIEF COMPLAINTS/PURPOSE OF CONSULTATION:  Recently diagnosed Metastatic Adenocarcinoma of colon  HISTORY OF PRESENTING ILLNESS:   Margaret [name] is a wonderful 48 y.o. female who has been referred to [place] by Dr. Nolene Ebbs for evaluation and management of Iron deficiency anemia. The pt reports that she is doing well overall.   The pt recently presented to the ED on 04/16/18 for right sided abdominal pain that was evaluated with a CT A/P which revealed liver and pancreatic cyst, recommended for outpatient MRI follow up. The pt was found to have a UTI, was treated with Rocephin and discharged with Keflex. Prior to this the pt was admitted on 04/09/18 for symptomatic anemia, presenting with HGB at 6.2, received 2 units PRBCs, one IV Iron infusion, and was encouraged to seek out GI as outpatient.  She has an appointment with [facility] on 05/19/18.   The pt reports that she is still feeling dizzy and light headed and denies feeling better after her recent blood transfusion. She notes that she dizziness presents with a feeling of warmness and that she begins hyperventilating, feeling nervous about her dizziness.  She notes that prior to her recent hospital stay, she never required a blood transfusion or IV iron replacement. She notes that as a child she had frequent nose bleeds, and was diagnosed with anemia. She also notes a history of ice picca.   She had a hysterectomy in 2012, and prior to this she had very heavy periods that began at age 25 and occurred almost constantly. She was on Depo and Mirena which did not help slow menstrual losses. Besides taking iron supplements while pregnant she has not taken PO iron replacement as she reports not tolerating it very well while pregnant.   She  denies concern for black stools or blood in the stools and has not had a colonoscopy or endoscopy before. The pt notes that she has not previously had a concern for stomach ulcers, but has acid reflux and takes Prevacid as needed for 7-8 years. She notes that she has not recently used Prevacid, for the last 3 months.  She is not taking vitamin replacements and denies any dietary restrictions.  The pt notes that she has been continuing to have lower right quadrant abdominal pain that radiates across her abdomen, presents for up to 45 minutes and occurs intermittently. She notes that her abdominal pain presented on 04/13/18.   The pt notes that was taking Ibuprofen 800mg  BID for 8 years to address her back pain related to her herniated disk. She stopped taking this 4 months ago.   Of note prior to the patient's visit today, pt has had CT A/P completed on 04/16/18 with results revealing No acute findings in the abdomen/pelvis. Least 4 low-density hepatic lesions, some of which may represent small cysts or hemangiomas, however there is a 19 mm lesion in the left lobe of the liver that is incompletely characterized. Recommend nonemergent hepatic protocol MRI for further evaluation.Marland Kitchen Possible 11 mm pancreatic nodule rising from the tail, alternatively this may represent a splenule. This can also be assessed at time of hepatic MRI. Gallstone without gallbladder inflammation. Colonic diverticulosis without diverticulitis.   Most recent lab results (04/16/18) of CBC is as follows: all values are WNL except for HGB at 9.5, HCT at 33.6,  MCH at 23.8, MCHC at 28.3, RDW at 23.2, PLT at 435k. Ferritin 04/09/18 was low at 6 Vitamin B12 on 04/09/18 was at 196  On review of systems, pt reports light headedness, dizziness, lower back pain, intermittent abdominal pain, and denies black stools, blood in the stools, tingling or numbness in her legs or arms, vaginal bleeding, and any other symptoms.  Interval History:   Margaret [name] returns today for management and evaluation and treatment of her newly diagnosed Metastatic Adenocarcinoma of GI primary. The patient's last visit with [place] was on 09/17/18. The pt reports that she is doing well overall.   The pt reports that she has had some nausea in the last few days and has used her supportive medications to alleviate this, and has been eating better with each day after treatment. She notes that her abdominal pain has completely resolved. She notes that she has continued to move her bowels. The pt notes that she has had some pain in her upper jaw/ears when biting down and chewing. She has avoided cold objects, liquids, and foods. She denies any tingling or numbness in her hands or feet.   Lab results today (09/29/18) of CBC w/diff and CMP is as follows: all values are WNL except for RBC at 3.40, HGB at 9.5, HCT at 31.5, Calcium at 8.7, Albumin at 2.8, AST at 8. 09/29/18 Magnesium at 1.9  On review of systems, pt reports moving her bowels well, some nausea, eating well, resolved abdominal pain, staying well hydrated, and denies black stools, blood in the stools, peripheral neuropathy, constipation, leg swelling, and any other symptoms.   MEDICAL HISTORY:  Past Medical History:  Diagnosis Date  . Allergic rhinitis   . Anxiety diagnosed in 1990  . Arthritis   . Asthma   . Dyspnea    with low blood count hx of anemia  . Eczema   . History of blood transfusion   . Hypertension   . Lower back pain   . Migraine   . Sleep apnea    uses CPAP    SURGICAL HISTORY: Past Surgical History:  Procedure Laterality Date  . ABDOMINAL HYSTERECTOMY    . cortisone injections     knees bilat and back   . IR IMAGING GUIDED PORT INSERTION  09/14/2018  . iron infusion    . LIVER BIOPSY      SOCIAL HISTORY: Social History   Socioeconomic History  . Marital status: Single    Spouse name: Not on file  . Number of children: Not on file  . Years of education: Not on file  .  Highest education level: Not on file  Occupational History  . Not on file  Social Needs  . Financial resource strain: Not on file  . Food insecurity:    Worry: Not on file    Inability: Not on file  . Transportation needs:    Medical: Not on file    Non-medical: Not on file  Tobacco Use  . Smoking status: Former Smoker    Last attempt to quit: 07/30/1999    Years since quitting: 19.1  . Smokeless tobacco: Never Used  Substance and Sexual Activity  . Alcohol use: No  . Drug use: No  . Sexual activity: Not on file  Lifestyle  . Physical activity:    Days per week: Not on file    Minutes per session: Not on file  . Stress: Not on file  Relationships  . Social connections:  Talks on phone: Not on file    Gets together: Not on file    Attends religious service: Not on file    Active member of club or organization: Not on file    Attends meetings of clubs or organizations: Not on file    Relationship status: Not on file  . Intimate partner violence:    Fear of current or ex partner: Not on file    Emotionally abused: Not on file    Physically abused: Not on file    Forced sexual activity: Not on file  Other Topics Concern  . Not on file  Social History Narrative  . Not on file    FAMILY HISTORY: No family history on file.  ALLERGIES:  is allergic to latex; tape; and tylenol with codeine #3 [acetaminophen-codeine].  MEDICATIONS:  Current Outpatient Medications  Medication Sig Dispense Refill  . albuterol (PROAIR HFA) 108 (90 Base) MCG/ACT inhaler INHALE TWO puffs BY MOUTH into the lungs EVERY 4 HOURS AS NEEDED FOR WHEEZING OR shortness of breath 18 g 1  . amoxicillin-clavulanate (AUGMENTIN) 875-125 MG tablet Take 1 tablet by mouth every 12 (twelve) hours. 10 tablet 0  . Boric Acid 4 % SOLN Place 1 capsule vaginally every Friday.     . cetirizine (ZYRTEC) 10 MG tablet Take 1 tablet (10 mg total) by mouth daily. 30 tablet 6  . clobetasol ointment (TEMOVATE) 5.05 %  Apply 1 application topically 2 (two) times daily. Use for Lichen Sclerosis    . Cyanocobalamin (B-12) 1000 MCG SUBL Place 1,000 mcg under the tongue daily. 30 each 3  . cyclobenzaprine (FLEXERIL) 10 MG tablet Take 10 mg by mouth 3 (three) times daily as needed (muscle spasms.).     Marland Kitchen dexamethasone (DECADRON) 4 MG tablet Take 2 tablets (8 mg total) by mouth daily. Start the day after chemotherapy for 2 days. Take with food. 30 tablet 1  . diazepam (VALIUM) 5 MG tablet Take 5 mg by mouth daily as needed for anxiety (panic attacks).     . diclofenac sodium (VOLTAREN) 1 % GEL Apply 2 g topically 4 (four) times daily as needed (for back/knee pain.).     Marland Kitchen dicyclomine (BENTYL) 20 MG tablet Take 1 tablet (20 mg total) by mouth 2 (two) times daily. (Patient taking differently: Take 20 mg by mouth 3 (three) times daily before meals. ) 20 tablet 0  . ergocalciferol (VITAMIN D2) 50000 UNITS capsule Take 50,000 Units by mouth every Monday.     . lidocaine-prilocaine (EMLA) cream Apply to affected area once 30 g 3  . lisinopril-hydrochlorothiazide (PRINZIDE,ZESTORETIC) 20-12.5 MG tablet Take 1 tablet by mouth daily.    . methocarbamol (ROBAXIN) 750 MG tablet Take 750 mg by mouth every 8 (eight) hours as needed (back spasms.).     Marland Kitchen montelukast (SINGULAIR) 10 MG tablet Take 1 tablet (10 mg total) by mouth at bedtime. 30 tablet 6  . nystatin (NYSTATIN) powder Apply 1 g topically 2 (two) times daily as needed (irritation). Use in skin folds and under breast     . nystatin-triamcinolone (MYCOLOG II) cream Apply 1 application topically 2 (two) times daily as needed (irritation). Use in skin folds and under breast     . ondansetron (ZOFRAN) 8 MG tablet Take 1 tablet (8 mg total) by mouth 2 (two) times daily as needed for refractory nausea / vomiting. Start on day 3 after chemotherapy. 30 tablet 1  . pantoprazole (PROTONIX) 40 MG tablet Take 40 mg by mouth  daily before breakfast.   1  . PATADAY 0.2 % SOLN Apply 2 drops  to eye 2 (two) times daily as needed (for irritated eyes.). 2.5 mL 5  . pimecrolimus (ELIDEL) 1 % cream Apply 1 application topically 2 (two) times daily. 30 g 6  . prochlorperazine (COMPAZINE) 10 MG tablet Take 1 tablet (10 mg total) by mouth every 6 (six) hours as needed (Nausea or vomiting). 30 tablet 1  . saccharomyces boulardii (FLORASTOR) 250 MG capsule Take 1 capsule (250 mg total) by mouth 2 (two) times daily.    . SYMBICORT 160-4.5 MCG/ACT inhaler Inhale 2 puffs into the lungs 2 (two) times daily. (Patient taking differently: Inhale 2 puffs into the lungs 2 (two) times daily. ) 10.2 g 3  . tiZANidine (ZANAFLEX) 4 MG tablet Take 4 mg by mouth every 8 (eight) hours as needed for spasms.    . traMADol (ULTRAM) 50 MG tablet Take 50 mg by mouth every 6 (six) hours as needed for moderate pain (back pain.).     Marland Kitchen triamcinolone (NASACORT) 55 MCG/ACT AERO nasal inhaler Place 2 sprays into the nose daily as needed (for allergies). 1 Inhaler 3  . valACYclovir (VALTREX) 500 MG tablet Take 500 mg by mouth 2 (two) times daily as needed (for cold sores).     . vitamin B-12 1000 MCG tablet Take 1 tablet (1,000 mcg total) by mouth daily.     No current facility-administered medications for this visit.     REVIEW OF SYSTEMS:    A 10+ POINT REVIEW OF SYSTEMS WAS OBTAINED including neurology, dermatology, psychiatry, cardiac, respiratory, lymph, extremities, GI, GU, Musculoskeletal, constitutional, breasts, reproductive, HEENT.  All pertinent positives are noted in the HPI.  All others are negative.   PHYSICAL EXAMINATION:  . Vitals:   09/29/18 0930  BP: (!) 162/90  Pulse: 91  Resp: 18  Temp: 98 F (36.7 C)  SpO2: 100%   Filed Weights   09/29/18 0930  Weight: (!) 358 lb 8 oz (162.6 kg)   .Body mass index is 51.44 kg/m.  GENERAL:alert, in no acute distress and comfortable SKIN: no acute rashes, no significant lesions EYES: conjunctiva are pink and non-injected, sclera anicteric OROPHARYNX:  MMM, no exudates, no oropharyngeal erythema or ulceration NECK: supple, no JVD LYMPH:  no palpable lymphadenopathy in the cervical, axillary or inguinal regions LUNGS: clear to auscultation b/l with normal respiratory effort HEART: regular rate & rhythm ABDOMEN:  normoactive bowel sounds , non tender, not distended. No palpable hepatosplenomegaly.  Extremity: no pedal edema PSYCH: alert & oriented x 3 with fluent speech NEURO: no focal motor/sensory deficits   LABORATORY DATA:  I have reviewed the data as listed  . CBC Latest Ref Rng & Units 09/29/2018 09/17/2018 09/14/2018  WBC 4.0 - 10.5 K/uL 4.3 6.7 8.4  Hemoglobin 12.0 - 15.0 g/dL 9.5(L) 10.7(L) 10.3(L)  Hematocrit 36.0 - 46.0 % 31.5(L) 37.5 35.7(L)  Platelets 150 - 400 K/uL 190 430(H) 483(H)    . CMP Latest Ref Rng & Units 09/29/2018 09/17/2018 09/06/2018  Glucose 70 - 99 mg/dL 93 93 89  BUN 6 - 20 mg/dL 12 10 6   Creatinine 0.44 - 1.00 mg/dL 0.81 0.96 0.88  Sodium 135 - 145 mmol/L 143 144 145  Potassium 3.5 - 5.1 mmol/L 4.1 4.1 3.4(L)  Chloride 98 - 111 mmol/L 110 108 109  CO2 22 - 32 mmol/L 26 28 28   Calcium 8.9 - 10.3 mg/dL 8.7(L) 9.3 8.8(L)  Total Protein 6.5 - 8.1 g/dL  6.9 7.8 7.3  Total Bilirubin 0.3 - 1.2 mg/dL 0.4 0.4 0.5  Alkaline Phos 38 - 126 U/L 83 88 90  AST 15 - 41 U/L 8(L) 12(L) 15  ALT 0 - 44 U/L 8 8 17    . Lab Results  Component Value Date   IRON 24 (L) 09/06/2018   TIBC 156 (L) 09/06/2018   IRONPCTSAT 15 (L) 09/06/2018   (Iron and TIBC)  Lab Results  Component Value Date   FERRITIN 658 (H) 09/06/2018   B12  - 196--> 584  07/22/18 Liver Biopsy:   08/27/18 Repeat Liver Biopsy:    RADIOGRAPHIC STUDIES: I have personally reviewed the radiological images as listed and agreed with the findings in the report. Ir Imaging Guided Port Insertion  Result Date: 09/14/2018 CLINICAL DATA:  Cecal carcinoma. Needs durable venous access for chemotherapy regimen. EXAM: TUNNELED PORT CATHETER PLACEMENT WITH  ULTRASOUND AND FLUOROSCOPIC GUIDANCE FLUOROSCOPY TIME:  0.2 minute; 167  uGym2 DAP ANESTHESIA/SEDATION: Intravenous Fentanyl and Versed were administered as conscious sedation during continuous monitoring of the patient's level of consciousness and physiological / cardiorespiratory status by the radiology RN, with a total moderate sedation time of 17 minutes. TECHNIQUE: The procedure, risks, benefits, and alternatives were explained to the patient. Questions regarding the procedure were encouraged and answered. The patient understands and consents to the procedure. As antibiotic prophylaxis, cefazolin 2 g was ordered pre-procedure and administered intravenously within one hour of incision. Patency of the right IJ vein was confirmed with ultrasound with image documentation. An appropriate skin site was determined. Skin site was marked. Region was prepped using maximum barrier technique including cap and mask, sterile gown, sterile gloves, large sterile sheet, and Chlorhexidine as cutaneous antisepsis. The region was infiltrated locally with 1% lidocaine. Under real-time ultrasound guidance, the right IJ vein was accessed with a 21 gauge micropuncture needle; the needle tip within the vein was confirmed with ultrasound image documentation. Needle was exchanged over a 018 guidewire for transitional dilator which allowed passage of the [facility] wire into the IVC. Over this, the transitional dilator was exchanged for a 5 [place] MPA catheter. A small incision was made on the right anterior chest wall and a subcutaneous pocket fashioned. The power-injectable port was positioned and its catheter tunneled to the right IJ dermatotomy site. The MPA catheter was exchanged over an Amplatz wire for a peel-away sheath, through which the port catheter, which had been trimmed to the appropriate length, was advanced and positioned under fluoroscopy with its tip at the cavoatrial junction. Spot chest radiograph confirms good catheter  position and no pneumothorax. The pocket was closed with deep interrupted and subcuticular continuous 3-0 Monocryl sutures. The port was flushed per protocol. The incisions were covered with Dermabond then covered with a sterile dressing. COMPLICATIONS: COMPLICATIONS None immediate IMPRESSION: Technically successful right IJ power-injectable port catheter placement. Ready for routine use. Electronically Signed   By: Lucrezia Europe M.D.   On: 09/14/2018 16:55    ASSESSMENT & PLAN:   48 y.o. female with  1. Iron Deficiency Anemia - ? Previous heavy periods vs GI losses  Recent heavy NSAID use ? Ulcer  2. B12 deficiency Labs upon initial presentation from 04/16/18, HGB at 9.5. The 04/09/18 Ferritin was low at 6 and Vitamin B12 was at 196 -No antiparietal antibodies, no intrinsic factor antibodies, normal TSH all from 04/28/18  PLAN: -Continue NSAID avoidance  -continue 1028mcg Vitamin B12 sublingually daily - B12 improved from 196 to 584  3.MetastaticAdenocarcinoma of GI primary  04/16/18 CT A/P with pt revealed No acute findings in the abdomen/pelvis. Least 4 low-density hepatic lesions, some of which may represent small cysts or hemangiomas, however there is a 19 mm lesion in the left lobe of the liver that is incompletely characterized. Recommend nonemergent hepatic protocol MRI for further evaluation.Marland Kitchen Possible 11 mm pancreatic nodule rising from the tail, alternatively this may represent a splenule. This can also be assessed at time of hepatic MRI. Gallstone without gallbladder inflammation. Colonic diverticulosis without diverticulitis.   07/09/18 Tumor marker work up: CEA normal at 1.32, AFP normal at 1.5, LDH normal at 138, CA 125 normal at 5.9, CA 19-9 normal at <2   07/22/18 Liver biopsy results revealed benign hepatic tissue, and was not diagnostic   07/16/18 PET/CT revealedThere is a solid hypermetabolic mass involving the cecum, worrisome for colonic primary neoplasm. Correlation  withcolonoscopy Findings. 2. Multiple hypermetabolic liver lesions compatible with metastaticdisease. 3. Large low-density lesion in right lobe of thyroid gland. Advise further evaluation with thyroid sonography. 4. Aortic Atherosclerosis. 5. Small hiatal hernia.   08/27/18 Liver biopsy revealed Metastatic adenocarcinoma, consistent with gastrointestinal primary   09/03/18 ECHO revealed an LV EF of 22-02%, mild diastolic dysfunction; mild RVE; dilated PA; mild TR; moderate to severe pulmonary hypertension  PLAN: -Discussed pt labwork today, 09/29/18; mild anemia with HGB at 9.5, other blood counts and chemistries are stable. Will continue to watch HGB. Magnesium is normal.  -The pt has no prohibitive toxicities from continuing C2 FOLFOX at this time. -09/17/18 CEA at 2.39, which was the last CEA pre-treatment value  -Reschedule Colonoscopy as soon as possible -Discussed again that pt should avoid cold objects and fluids during treatment with FOLFOX, which she has done.  -given neurotoxicity from Oxaloplatin - grade 1-2 --- would give oxaloplatin over 4 hours instead of 2hours -Will evaluate further treatment options as the pending genetic testing results  -Recommend saline sprays OTC to prevent further epistaxis  -Recommend soft foods that are easy to digest  -Recommended that the pt continue to eat well, drink at least 48-64 oz of water each day, and walk 20-30 minutes each day.  -Will see the pt back in 4 weeks   Please schedule next 2 cycles of FOLFOX with labs as per orders RTC with Dr Irene Limbo in 4 weeks   All of the patients questions were answered with apparent satisfaction. The patient knows to call the clinic with any problems, questions or concerns.  The total time spent in the appt was 25 minutes and more than 50% was on counseling and direct patient cares.    Sullivan Lone MD MS AAHIVMS [facility] Hematology/Oncology Physician [facility]  (Office):        [phone] (Work cell):  [phone] (Fax):           [phone]  09/29/2018 10:16 AM  I, Baldwin Jamaica, am acting as a scribe for Dr. Sullivan Lone.   .I have reviewed the above documentation for accuracy and completeness, and I agree with the above. Brunetta Genera MD

## 2018-09-29 ENCOUNTER — Inpatient Hospital Stay (HOSPITAL_BASED_OUTPATIENT_CLINIC_OR_DEPARTMENT_OTHER): Payer: Medicaid Other | Admitting: Hematology

## 2018-09-29 ENCOUNTER — Inpatient Hospital Stay: Payer: Medicaid Other

## 2018-09-29 ENCOUNTER — Telehealth: Payer: Self-pay | Admitting: Hematology

## 2018-09-29 ENCOUNTER — Telehealth: Payer: Self-pay | Admitting: *Deleted

## 2018-09-29 VITALS — BP 162/90 | HR 91 | Temp 98.0°F | Resp 18 | Ht 70.0 in | Wt 358.5 lb

## 2018-09-29 VITALS — BP 162/101 | HR 88 | Temp 98.1°F | Resp 20

## 2018-09-29 DIAGNOSIS — C787 Secondary malignant neoplasm of liver and intrahepatic bile duct: Secondary | ICD-10-CM

## 2018-09-29 DIAGNOSIS — E538 Deficiency of other specified B group vitamins: Secondary | ICD-10-CM

## 2018-09-29 DIAGNOSIS — D509 Iron deficiency anemia, unspecified: Secondary | ICD-10-CM

## 2018-09-29 DIAGNOSIS — Z87891 Personal history of nicotine dependence: Secondary | ICD-10-CM

## 2018-09-29 DIAGNOSIS — C189 Malignant neoplasm of colon, unspecified: Secondary | ICD-10-CM

## 2018-09-29 DIAGNOSIS — I10 Essential (primary) hypertension: Secondary | ICD-10-CM

## 2018-09-29 DIAGNOSIS — Z5111 Encounter for antineoplastic chemotherapy: Secondary | ICD-10-CM | POA: Diagnosis not present

## 2018-09-29 DIAGNOSIS — Z7189 Other specified counseling: Secondary | ICD-10-CM

## 2018-09-29 DIAGNOSIS — C269 Malignant neoplasm of ill-defined sites within the digestive system: Secondary | ICD-10-CM | POA: Diagnosis not present

## 2018-09-29 LAB — CMP (CANCER CENTER ONLY)
ALT: 8 U/L (ref 0–44)
AST: 8 U/L — ABNORMAL LOW (ref 15–41)
Albumin: 2.8 g/dL — ABNORMAL LOW (ref 3.5–5.0)
Alkaline Phosphatase: 83 U/L (ref 38–126)
Anion gap: 7 (ref 5–15)
BUN: 12 mg/dL (ref 6–20)
CALCIUM: 8.7 mg/dL — AB (ref 8.9–10.3)
CO2: 26 mmol/L (ref 22–32)
Chloride: 110 mmol/L (ref 98–111)
Creatinine: 0.81 mg/dL (ref 0.44–1.00)
GFR, Est AFR Am: >60 mL/min (ref >60)
GFR, Estimated: >60 mL/min (ref >60)
Glucose, Bld: 93 mg/dL (ref 70–99)
Potassium: 4.1 mmol/L (ref 3.5–5.1)
Sodium: 143 mmol/L (ref 135–145)
Total Bilirubin: 0.4 mg/dL (ref 0.3–1.2)
Total Protein: 6.9 g/dL (ref 6.5–8.1)

## 2018-09-29 LAB — CBC WITH DIFFERENTIAL (CANCER CENTER ONLY)
Abs Immature Granulocytes: 0.01 10*3/uL (ref 0.00–0.07)
Basophils Absolute: 0 10*3/uL (ref 0.0–0.1)
Basophils Relative: 0 %
Eosinophils Absolute: 0 10*3/uL (ref 0.0–0.5)
Eosinophils Relative: 1 %
HCT: 31.5 % — ABNORMAL LOW (ref 36.0–46.0)
Hemoglobin: 9.5 g/dL — ABNORMAL LOW (ref 12.0–15.0)
Immature Granulocytes: 0 %
LYMPHS PCT: 32 %
Lymphs Abs: 1.4 10*3/uL (ref 0.7–4.0)
MCH: 27.9 pg (ref 26.0–34.0)
MCHC: 30.2 g/dL (ref 30.0–36.0)
MCV: 92.6 fL (ref 80.0–100.0)
Monocytes Absolute: 0.2 10*3/uL (ref 0.1–1.0)
Monocytes Relative: 6 %
Neutro Abs: 2.7 10*3/uL (ref 1.7–7.7)
Neutrophils Relative %: 61 %
Platelet Count: 190 10*3/uL (ref 150–400)
RBC: 3.4 MIL/uL — AB (ref 3.87–5.11)
RDW: 15.5 % (ref 11.5–15.5)
WBC: 4.3 10*3/uL (ref 4.0–10.5)
nRBC: 0 % (ref 0.0–0.2)

## 2018-09-29 LAB — MAGNESIUM: Magnesium: 1.9 mg/dL (ref 1.7–2.4)

## 2018-09-29 MED ORDER — DEXAMETHASONE SODIUM PHOSPHATE 10 MG/ML IJ SOLN
INTRAMUSCULAR | Status: AC
  Filled 2018-09-29: qty 1

## 2018-09-29 MED ORDER — FLUOROURACIL CHEMO INJECTION 2.5 GM/50ML
400.0000 mg/m2 | Freq: Once | INTRAVENOUS | Status: AC
  Administered 2018-09-29: 1150 mg via INTRAVENOUS
  Filled 2018-09-29: qty 23

## 2018-09-29 MED ORDER — SODIUM CHLORIDE 0.9 % IV SOLN
2400.0000 mg/m2 | INTRAVENOUS | Status: DC
  Administered 2018-09-29: 6950 mg via INTRAVENOUS
  Filled 2018-09-29: qty 139

## 2018-09-29 MED ORDER — PALONOSETRON HCL INJECTION 0.25 MG/5ML
0.2500 mg | Freq: Once | INTRAVENOUS | Status: AC
  Administered 2018-09-29: 0.25 mg via INTRAVENOUS

## 2018-09-29 MED ORDER — DEXAMETHASONE SODIUM PHOSPHATE 10 MG/ML IJ SOLN
10.0000 mg | Freq: Once | INTRAMUSCULAR | Status: AC
  Administered 2018-09-29: 10 mg via INTRAVENOUS

## 2018-09-29 MED ORDER — OXALIPLATIN CHEMO INJECTION 100 MG/20ML
250.0000 mg | Freq: Once | INTRAVENOUS | Status: AC
  Administered 2018-09-29: 250 mg via INTRAVENOUS
  Filled 2018-09-29: qty 40

## 2018-09-29 MED ORDER — LEUCOVORIN CALCIUM INJECTION 350 MG
400.0000 mg/m2 | Freq: Once | INTRAVENOUS | Status: AC
  Administered 2018-09-29: 1156 mg via INTRAVENOUS
  Filled 2018-09-29: qty 57.8

## 2018-09-29 MED ORDER — DEXTROSE 5 % IV SOLN
Freq: Once | INTRAVENOUS | Status: AC
  Administered 2018-09-29: 11:00:00 via INTRAVENOUS
  Filled 2018-09-29: qty 250

## 2018-09-29 MED ORDER — PALONOSETRON HCL INJECTION 0.25 MG/5ML
INTRAVENOUS | Status: AC
  Filled 2018-09-29: qty 5

## 2018-09-29 NOTE — Telephone Encounter (Signed)
Contacted [facility] [phone] at Dr. Grier Mitts request to request foundation one testing on sample received in December. Following information given to [facility]:  Metastatic colon cancer, ICD 10 C18.9, C78.7  Accession number: [id number]  Received 08/28/2019

## 2018-09-29 NOTE — Progress Notes (Signed)
As patient was preparing to leave infusion suite, she began complaining of cramping in her hands, sweating, and difficulty lifting her left leg. Denied any chest pain or shortness of breath. Vitals taken and stable for patient. Dr. Irene Limbo updated and came to assess patient in treatment area. Dr. Irene Limbo performed a neuro-assessment and deemed that pt's symptoms were related to Oxaliplatin infusion. He also mentioned increasing Oxaliplatin infusion time to 3-4 hours for future infusions to decrease chance of symptoms reoccurring. Hot pack applied to hands, warm fluids provided, and pt wrapped in warm blankets. Pt observed for another 20 minutes and stated that her symptoms had almost completely resolved. Encouraged pt to call clinic should these symptoms return or new ones arise. Pt verbalized understanding and agreement, and able to ambulate out of clinic without incident.   Vitals:   09/29/18 1416  BP: (!) 162/101  Pulse: 88  Resp: 20  Temp: 98.1 F (36.7 C)  TempSrc: Oral  SpO2: 100%

## 2018-09-29 NOTE — Telephone Encounter (Signed)
Completed ppw for patient;s student loan deferment and ppwk for patient to receive assistance with ADLs, through [employer]. Student loan ppwk taken by patient and ADL ppwk faxed to [employer] at [phone], fax confirmation received.

## 2018-09-29 NOTE — Patient Instructions (Signed)
Thank you for choosing [facility] to provide your oncology and hematology care.  To afford each patient quality time with our providers, please arrive 30 minutes before your scheduled appointment time.  If you arrive late for your appointment, you may be asked to reschedule.  We strive to give you quality time with our providers, and arriving late affects you and other patients whose appointments are after yours.    If you are a no show for multiple scheduled visits, you may be dismissed from the clinic at the providers discretion.     Again, thank you for choosing [facility], our hope is that these requests will decrease the amount of time that you wait before being seen by our physicians.  ______________________________________________________________________   Should you have questions after your visit to the  Cancer Center, please contact our office at [phone] between the hours of 8:30 and 4:30 p.m.    Voicemails left after 4:30p.m will not be returned until the following business day.     For prescription refill requests, please have your pharmacy contact us directly.  Please also try to allow 48 hours for prescription requests.     Please contact the scheduling department for questions regarding scheduling.  For scheduling of procedures such as PET scans, CT scans, MRI, Ultrasound, etc please contact central scheduling at [phone].     Resources For Cancer Patients and Caregivers:    Oncolink.org:  A wonderful resource for patients and healthcare providers for information regarding your disease, ways to tract your treatment, what to expect, etc.      American Cancer Society:  800-227-2345  Can help patients locate various types of support and financial assistance   Cancer Care: 1-800-813-HOPE (4673) Provides financial assistance, online support groups, medication/co-pay assistance.     [place] DSS:  336-641-3447 Where to apply  for food stamps, Medicaid, and utility assistance   Medicare Rights Center: 800-333-4114 Helps people with Medicare understand their rights and benefits, navigate the Medicare system, and secure the quality healthcare they deserve   SCAT: 336-333-6589 [place] [employer]'s shared-ride transportation service for eligible riders who have a disability that prevents them from riding the fixed route bus.     For additional information on assistance programs please contact our social worker:   Abigail Elmore:  [phone]  

## 2018-09-29 NOTE — Patient Instructions (Addendum)
[place] Discharge Instructions for Patients Receiving Chemotherapy  Today you received the following chemotherapy agents: Oxaliplatin (Eloxatin), Leucovorin, and Fluorouracil (Adrucil, 5-FU)  To help prevent nausea and vomiting after your treatment, we encourage you to take your nausea medication as directed. Received Aloxi during treatment today-->Take Compazine (not Zofran) for the next 3 days as needed.    If you develop nausea and vomiting that is not controlled by your nausea medication, call the clinic.   BELOW ARE SYMPTOMS THAT SHOULD BE REPORTED IMMEDIATELY:  *FEVER GREATER THAN 100.5 F  *CHILLS WITH OR WITHOUT FEVER  NAUSEA AND VOMITING THAT IS NOT CONTROLLED WITH YOUR NAUSEA MEDICATION  *UNUSUAL SHORTNESS OF BREATH  *UNUSUAL BRUISING OR BLEEDING  TENDERNESS IN MOUTH AND THROAT WITH OR WITHOUT PRESENCE OF ULCERS  *URINARY PROBLEMS  *BOWEL PROBLEMS  UNUSUAL RASH Items with * indicate a potential emergency and should be followed up as soon as possible.  Feel free to call the clinic should you have any questions or concerns. The clinic phone number is [phone].  Please show the [place] at check-in to the Emergency Department and triage nurse.  [place] Discharge Instructions for Patients receiving Home Portable Chemo Pump   **The bag should finish at 46 hours, 96 hours or 7 days. For example, if your pump is scheduled for 46 hours and it was put on at 4pm, it should finish at 2 pm the day it is scheduled to come off regardless of your appointment time.    Estimated time to finish   _________________________ (Have your nurse fill in)     ** if the display on your pump reads "Low Volume" and it is beeping, take the batteries out of the pump and come to the cancer center for it to be taken off.   **If the pump alarms go off prior to the pump reading "Low Volume" then call the [phone] and someone can assist  you.  **If the plunger comes out and the bag fluid is running out, please use your chemo spill kit to clean up the spill. Do not use paper towels or other house hold products.  ** If you have problems or questions regarding your pump, please call either the [phone] or the cancer center Monday-Friday 8:00am-4:30pm at [phone] and we will assist you.  If you are unable to get assistance then go to [facility], ask the staff to contact the IV team for assistance.

## 2018-09-29 NOTE — Telephone Encounter (Signed)
Printed calendar and avs. °

## 2018-09-30 ENCOUNTER — Telehealth: Payer: Self-pay | Admitting: *Deleted

## 2018-09-30 ENCOUNTER — Other Ambulatory Visit: Payer: Self-pay | Admitting: Hematology

## 2018-09-30 MED ORDER — DIAZEPAM 5 MG PO TABS: 5.0000 mg | ORAL_TABLET | Freq: Every day | ORAL | 0 refills | Status: DC | PRN

## 2018-09-30 NOTE — Telephone Encounter (Signed)
Patient called - she has migraine and usually takes Valium 5 mg for it. She has 2 left, will take 1. Has been prescribed by PCP in past, but they won't prescribe today because she has not been in office in last 90 days. Patient states she's been coming here to [facility] and has not seen PCP in last 90 days . She is wondering if Dr. Irene Limbo will prescribe Valium. Information given to Dr. Irene Limbo. He agreed to prescribe this time. Contactacted patient to inform her. She verbalized understanding.

## 2018-10-01 ENCOUNTER — Inpatient Hospital Stay: Payer: Medicaid Other

## 2018-10-01 VITALS — BP 150/68 | HR 80 | Temp 98.2°F | Resp 20

## 2018-10-01 DIAGNOSIS — C189 Malignant neoplasm of colon, unspecified: Secondary | ICD-10-CM

## 2018-10-01 DIAGNOSIS — C787 Secondary malignant neoplasm of liver and intrahepatic bile duct: Principal | ICD-10-CM

## 2018-10-01 DIAGNOSIS — Z7189 Other specified counseling: Secondary | ICD-10-CM

## 2018-10-01 DIAGNOSIS — Z5111 Encounter for antineoplastic chemotherapy: Secondary | ICD-10-CM | POA: Diagnosis not present

## 2018-10-01 MED ORDER — HEPARIN SOD (PORK) LOCK FLUSH 100 UNIT/ML IV SOLN
500.0000 [IU] | Freq: Once | INTRAVENOUS | Status: AC | PRN
  Administered 2018-10-01: 500 [IU]
  Filled 2018-10-01: qty 5

## 2018-10-01 MED ORDER — SODIUM CHLORIDE 0.9% FLUSH
10.0000 mL | INTRAVENOUS | Status: DC | PRN
  Administered 2018-10-01: 10 mL
  Filled 2018-10-01: qty 10

## 2018-10-05 ENCOUNTER — Encounter: Payer: Self-pay | Admitting: Pharmacist

## 2018-10-06 ENCOUNTER — Telehealth: Payer: Self-pay | Admitting: *Deleted

## 2018-10-06 NOTE — Telephone Encounter (Signed)
Faxed order to [facility] (incontinence team) [phone] for supply of incontinence briefs. Fax confirmation received.  Briefs requested by Rayburn Ma, RN [facility] RN, who is providing patient care management, [phone].

## 2018-10-13 NOTE — Progress Notes (Signed)
HEMATOLOGY/ONCOLOGY CLINIC NOTE  Date of Service: 10/14/2018  Patient Care Team: Nolene Ebbs, MD as PCP - General (Internal Medicine) Jackelyn Knife, MD as Rounding Team (Internal Medicine)  CHIEF COMPLAINTS/PURPOSE OF CONSULTATION:  Recently diagnosed Metastatic Adenocarcinoma of colon  HISTORY OF PRESENTING ILLNESS:   Margaret [name] is a wonderful 48 y.o. female who has been referred to [place] by Dr. Nolene Ebbs for evaluation and management of Iron deficiency anemia. The pt reports that she is doing well overall.   The pt recently presented to the ED on 04/16/18 for right sided abdominal pain that was evaluated with a CT A/P which revealed liver and pancreatic cyst, recommended for outpatient MRI follow up. The pt was found to have a UTI, was treated with Rocephin and discharged with Keflex. Prior to this the pt was admitted on 04/09/18 for symptomatic anemia, presenting with HGB at 6.2, received 2 units PRBCs, one IV Iron infusion, and was encouraged to seek out GI as outpatient.  She has an appointment with [facility] on 05/19/18.   The pt reports that she is still feeling dizzy and light headed and denies feeling better after her recent blood transfusion. She notes that she dizziness presents with a feeling of warmness and that she begins hyperventilating, feeling nervous about her dizziness.  She notes that prior to her recent hospital stay, she never required a blood transfusion or IV iron replacement. She notes that as a child she had frequent nose bleeds, and was diagnosed with anemia. She also notes a history of ice picca.   She had a hysterectomy in 2012, and prior to this she had very heavy periods that began at age 89 and occurred almost constantly. She was on Depo and Mirena which did not help slow menstrual losses. Besides taking iron supplements while pregnant she has not taken PO iron replacement as she reports not tolerating it very well while pregnant.   She denies  concern for black stools or blood in the stools and has not had a colonoscopy or endoscopy before. The pt notes that she has not previously had a concern for stomach ulcers, but has acid reflux and takes Prevacid as needed for 7-8 years. She notes that she has not recently used Prevacid, for the last 3 months.  She is not taking vitamin replacements and denies any dietary restrictions.  The pt notes that she has been continuing to have lower right quadrant abdominal pain that radiates across her abdomen, presents for up to 45 minutes and occurs intermittently. She notes that her abdominal pain presented on 04/13/18.   The pt notes that was taking Ibuprofen 800mg  BID for 8 years to address her back pain related to her herniated disk. She stopped taking this 4 months ago.   Of note prior to the patient's visit today, pt has had CT A/P completed on 04/16/18 with results revealing No acute findings in the abdomen/pelvis. Least 4 low-density hepatic lesions, some of which may represent small cysts or hemangiomas, however there is a 19 mm lesion in the left lobe of the liver that is incompletely characterized. Recommend nonemergent hepatic protocol MRI for further evaluation.Marland Kitchen Possible 11 mm pancreatic nodule rising from the tail, alternatively this may represent a splenule. This can also be assessed at time of hepatic MRI. Gallstone without gallbladder inflammation. Colonic diverticulosis without diverticulitis.   Most recent lab results (04/16/18) of CBC is as follows: all values are WNL except for HGB at 9.5, HCT at 33.6,  MCH at 23.8, MCHC at 28.3, RDW at 23.2, PLT at 435k. Ferritin 04/09/18 was low at 6 Vitamin B12 on 04/09/18 was at 196  On review of systems, pt reports light headedness, dizziness, lower back pain, intermittent abdominal pain, and denies black stools, blood in the stools, tingling or numbness in her legs or arms, vaginal bleeding, and any other symptoms.  Interval History:   Margaret [name] returns today for management, evaluation, and treatment of her newly diagnosed Metastatic Adenocarcinoma of GI primary. The patient's last visit with [place] was on 09/29/2018. She is on C3 of FOLFOX. The pt reports that she is doing well overall and she has still had intermittent headaches that she attributes to zofran use. She notes that spacing out the treatment didn't bother her.   The pt reports that with dexamethasone, when the pump is out, her appetite increases. Her compazine helps with nausea. She has been consuming a lot of water.   Lab results today (10/14/18) of CBC w/diff and CMP is as follows: all values are WNL except for WBC at 3.2, RBC at 3.65, and Hgb at 10.2.  On review of systems, pt reports mouth sore, right lower abdominal pain only with bowel movement and resolved following, sharp pain with chewing food, increasing appetite on the 3rd day of treatment and denies fever, chills, leg swelling, blood in stool, and any other symptoms. She denies taking any other medications at this time. She is only taking her vitamin D and B12 medication.    MEDICAL HISTORY:  Past Medical History:  Diagnosis Date  . Allergic rhinitis   . Anxiety diagnosed in 1990  . Arthritis   . Asthma   . Dyspnea    with low blood count hx of anemia  . Eczema   . History of blood transfusion   . Hypertension   . Lower back pain   . Migraine   . Sleep apnea    uses CPAP    SURGICAL HISTORY: Past Surgical History:  Procedure Laterality Date  . ABDOMINAL HYSTERECTOMY    . cortisone injections     knees bilat and back   . IR IMAGING GUIDED PORT INSERTION  09/14/2018  . iron infusion    . LIVER BIOPSY      SOCIAL HISTORY: Social History   Socioeconomic History  . Marital status: Single    Spouse name: Not on file  . Number of children: Not on file  . Years of education: Not on file  . Highest education level: Not on file  Occupational History  . Not on file  Social Needs  .  Financial resource strain: Not on file  . Food insecurity:    Worry: Not on file    Inability: Not on file  . Transportation needs:    Medical: Not on file    Non-medical: Not on file  Tobacco Use  . Smoking status: Former Smoker    Last attempt to quit: 07/30/1999    Years since quitting: 19.2  . Smokeless tobacco: Never Used  Substance and Sexual Activity  . Alcohol use: No  . Drug use: No  . Sexual activity: Not on file  Lifestyle  . Physical activity:    Days per week: Not on file    Minutes per session: Not on file  . Stress: Not on file  Relationships  . Social connections:    Talks on phone: Not on file    Gets together: Not on file  Attends religious service: Not on file    Active member of club or organization: Not on file    Attends meetings of clubs or organizations: Not on file    Relationship status: Not on file  . Intimate partner violence:    Fear of current or ex partner: Not on file    Emotionally abused: Not on file    Physically abused: Not on file    Forced sexual activity: Not on file  Other Topics Concern  . Not on file  Social History Narrative  . Not on file    FAMILY HISTORY: No family history on file.  ALLERGIES:  is allergic to latex; tape; and tylenol with codeine #3 [acetaminophen-codeine].  MEDICATIONS:  Current Outpatient Medications  Medication Sig Dispense Refill  . albuterol (PROAIR HFA) 108 (90 Base) MCG/ACT inhaler INHALE TWO puffs BY MOUTH into the lungs EVERY 4 HOURS AS NEEDED FOR WHEEZING OR shortness of breath 18 g 1  . amoxicillin-clavulanate (AUGMENTIN) 875-125 MG tablet Take 1 tablet by mouth every 12 (twelve) hours. 10 tablet 0  . Boric Acid 4 % SOLN Place 1 capsule vaginally every Friday.     . cetirizine (ZYRTEC) 10 MG tablet Take 1 tablet (10 mg total) by mouth daily. 30 tablet 6  . clobetasol ointment (TEMOVATE) 8.11 % Apply 1 application topically 2 (two) times daily. Use for Lichen Sclerosis    . Cyanocobalamin  (B-12) 1000 MCG SUBL Place 1,000 mcg under the tongue daily. 30 each 3  . cyclobenzaprine (FLEXERIL) 10 MG tablet Take 10 mg by mouth 3 (three) times daily as needed (muscle spasms.).     Marland Kitchen dexamethasone (DECADRON) 4 MG tablet Take 2 tablets (8 mg total) by mouth daily. Start the day after chemotherapy for 2 days. Take with food. 30 tablet 1  . diazepam (VALIUM) 5 MG tablet Take 1 tablet (5 mg total) by mouth daily as needed for anxiety (panic attacks). 30 tablet 0  . diclofenac sodium (VOLTAREN) 1 % GEL Apply 2 g topically 4 (four) times daily as needed (for back/knee pain.).     Marland Kitchen dicyclomine (BENTYL) 20 MG tablet Take 1 tablet (20 mg total) by mouth 2 (two) times daily. (Patient taking differently: Take 20 mg by mouth 3 (three) times daily before meals. ) 20 tablet 0  . ergocalciferol (VITAMIN D2) 50000 UNITS capsule Take 50,000 Units by mouth every Monday.     . lidocaine-prilocaine (EMLA) cream Apply to affected area once 30 g 3  . lisinopril-hydrochlorothiazide (PRINZIDE,ZESTORETIC) 20-12.5 MG tablet Take 1 tablet by mouth daily.    . methocarbamol (ROBAXIN) 750 MG tablet Take 750 mg by mouth every 8 (eight) hours as needed (back spasms.).     Marland Kitchen montelukast (SINGULAIR) 10 MG tablet Take 1 tablet (10 mg total) by mouth at bedtime. 30 tablet 6  . nystatin (NYSTATIN) powder Apply 1 g topically 2 (two) times daily as needed (irritation). Use in skin folds and under breast     . nystatin-triamcinolone (MYCOLOG II) cream Apply 1 application topically 2 (two) times daily as needed (irritation). Use in skin folds and under breast     . ondansetron (ZOFRAN) 8 MG tablet Take 1 tablet (8 mg total) by mouth 2 (two) times daily as needed for refractory nausea / vomiting. Start on day 3 after chemotherapy. 30 tablet 1  . pantoprazole (PROTONIX) 40 MG tablet Take 40 mg by mouth daily before breakfast.   1  . PATADAY 0.2 % SOLN Apply 2  drops to eye 2 (two) times daily as needed (for irritated eyes.). 2.5 mL 5    . pimecrolimus (ELIDEL) 1 % cream Apply 1 application topically 2 (two) times daily. 30 g 6  . prochlorperazine (COMPAZINE) 10 MG tablet Take 1 tablet (10 mg total) by mouth every 6 (six) hours as needed (Nausea or vomiting). 30 tablet 1  . saccharomyces boulardii (FLORASTOR) 250 MG capsule Take 1 capsule (250 mg total) by mouth 2 (two) times daily.    . SYMBICORT 160-4.5 MCG/ACT inhaler Inhale 2 puffs into the lungs 2 (two) times daily. (Patient taking differently: Inhale 2 puffs into the lungs 2 (two) times daily. ) 10.2 g 3  . tiZANidine (ZANAFLEX) 4 MG tablet Take 4 mg by mouth every 8 (eight) hours as needed for spasms.    . traMADol (ULTRAM) 50 MG tablet Take 50 mg by mouth every 6 (six) hours as needed for moderate pain (back pain.).     Marland Kitchen triamcinolone (NASACORT) 55 MCG/ACT AERO nasal inhaler Place 2 sprays into the nose daily as needed (for allergies). 1 Inhaler 3  . valACYclovir (VALTREX) 500 MG tablet Take 500 mg by mouth 2 (two) times daily as needed (for cold sores).     . vitamin B-12 1000 MCG tablet Take 1 tablet (1,000 mcg total) by mouth daily.     No current facility-administered medications for this visit.     REVIEW OF SYSTEMS:    A 10+ POINT REVIEW OF SYSTEMS WAS OBTAINED including neurology, dermatology, psychiatry, cardiac, respiratory, lymph, extremities, GI, GU, Musculoskeletal, constitutional, breasts, reproductive, HEENT.  All pertinent positives are noted in the HPI.  All others are negative.   PHYSICAL EXAMINATION:  . Vitals:   10/14/18 1125  BP: (!) 159/95  Pulse: 100  Resp: 18  Temp: 97.9 F (36.6 C)  SpO2: 99%   Filed Weights   10/14/18 1125  Weight: (!) 361 lb 1.6 oz (163.8 kg)   .Body mass index is 51.81 kg/m.  GENERAL:alert, in no acute distress and comfortable SKIN: no acute rashes, no significant lesions EYES: conjunctiva are pink and non-injected, sclera anicteric OROPHARYNX: MMM, no exudates, no oropharyngeal erythema or ulceration NECK:  supple, no JVD LYMPH:  no palpable lymphadenopathy in the cervical, axillary or inguinal regions LUNGS: clear to auscultation b/l with normal respiratory effort HEART: regular rate & rhythm ABDOMEN:  normoactive bowel sounds , non tender, not distended. No palpable hepatosplenomegaly.  Extremity: no pedal edema PSYCH: alert & oriented x 3 with fluent speech NEURO: no focal motor/sensory deficits   LABORATORY DATA:  I have reviewed the data as listed  . CBC Latest Ref Rng & Units 10/14/2018 09/29/2018 09/17/2018  WBC 4.0 - 10.5 K/uL 3.2(L) 4.3 6.7  Hemoglobin 12.0 - 15.0 g/dL 10.2(L) 9.5(L) 10.7(L)  Hematocrit 36.0 - 46.0 % 33.7(L) 31.5(L) 37.5  Platelets 150 - 400 K/uL 208 190 430(H)   ANC 900 . CMP Latest Ref Rng & Units 09/29/2018 09/17/2018 09/06/2018  Glucose 70 - 99 mg/dL 93 93 89  BUN 6 - 20 mg/dL 12 10 6   Creatinine 0.44 - 1.00 mg/dL 0.81 0.96 0.88  Sodium 135 - 145 mmol/L 143 144 145  Potassium 3.5 - 5.1 mmol/L 4.1 4.1 3.4(L)  Chloride 98 - 111 mmol/L 110 108 109  CO2 22 - 32 mmol/L 26 28 28   Calcium 8.9 - 10.3 mg/dL 8.7(L) 9.3 8.8(L)  Total Protein 6.5 - 8.1 g/dL 6.9 7.8 7.3  Total Bilirubin 0.3 - 1.2 mg/dL 0.4 0.4  0.5  Alkaline Phos 38 - 126 U/L 83 88 90  AST 15 - 41 U/L 8(L) 12(L) 15  ALT 0 - 44 U/L 8 8 17    . Lab Results  Component Value Date   IRON 24 (L) 09/06/2018   TIBC 156 (L) 09/06/2018   IRONPCTSAT 15 (L) 09/06/2018   (Iron and TIBC)  Lab Results  Component Value Date   FERRITIN 658 (H) 09/06/2018   B12  - 196--> 584  07/22/18 Liver Biopsy:   08/27/18 Repeat Liver Biopsy:    RADIOGRAPHIC STUDIES: I have personally reviewed the radiological images as listed and agreed with the findings in the report. No results found.  ASSESSMENT & PLAN:   48 y.o. female with  1. Iron Deficiency Anemia - ? Previous heavy periods vs GI losses  Recent heavy NSAID use ? Ulcer  2. B12 deficiency Labs upon initial presentation from 04/16/18, HGB at 9.5. The  04/09/18 Ferritin was low at 6 and Vitamin B12 was at 196 -No antiparietal antibodies, no intrinsic factor antibodies, normal TSH all from 04/28/18  PLAN: -Continue NSAID avoidance  -continue 1070mcg Vitamin B12 sublingually daily - B12 improved from 196 to 584  3.MetastaticAdenocarcinoma of GI primary  04/16/18 CT A/P with pt revealed No acute findings in the abdomen/pelvis. Least 4 low-density hepatic lesions, some of which may represent small cysts or hemangiomas, however there is a 19 mm lesion in the left lobe of the liver that is incompletely characterized. Recommend nonemergent hepatic protocol MRI for further evaluation.Marland Kitchen Possible 11 mm pancreatic nodule rising from the tail, alternatively this may represent a splenule. This can also be assessed at time of hepatic MRI. Gallstone without gallbladder inflammation. Colonic diverticulosis without diverticulitis.   07/09/18 Tumor marker work up: CEA normal at 1.32, AFP normal at 1.5, LDH normal at 138, CA 125 normal at 5.9, CA 19-9 normal at <2   07/22/18 Liver biopsy results revealed benign hepatic tissue, and was not diagnostic   07/16/18 PET/CT revealedThere is a solid hypermetabolic mass involving the cecum, worrisome for colonic primary neoplasm. Correlation withcolonoscopy Findings. 2. Multiple hypermetabolic liver lesions compatible with metastaticdisease. 3. Large low-density lesion in right lobe of thyroid gland. Advise further evaluation with thyroid sonography. 4. Aortic Atherosclerosis. 5. Small hiatal hernia.   08/27/18 Liver biopsy revealed Metastatic adenocarcinoma, consistent with gastrointestinal primary   09/03/18 ECHO revealed an LV EF of 39-76%, mild diastolic dysfunction; mild RVE; dilated PA; mild TR; moderate to severe pulmonary hypertension  09/17/18 CEA at 2.39, which was the last CEA pre-treatment value   4. Neutropenia PLAN: -Discussed pt labwork today, 10/14/18; WBC at 3.2 ANC 900, RBC at 3.65, and Hgb at  10.2. -Due to the decreasing WBC at 3.2, we will post-pone FOLFOX C3 by 5-7 days. If it persists, then we will drop the bolus of 5-FU.  -She has been using 16 ounces of water, a teaspoon of salt, a teaspoon of baking soda and gargling as needed.  -Discussed again that pt should avoid cold objects and fluids during treatment with FOLFOX, which she has done.  -given neurotoxicity from Oxaloplatin - grade 1-2 --- would give oxaloplatin over 4 hours instead of 2hours -Will evaluate further treatment options as the pending genetic testing results  -Recommend saline sprays OTC to prevent further epistaxis  -Recommend soft foods that are easy to digest  -Recommended that the pt continue to eat well, drink at least 48-64 oz of water each day, and walk 20-30 minutes each day.  -  Follow up for following cycle.   Please reschedule C3 FOLFOX from today 1/30 to 2/6 with labs Please adjust subsequent treatment cycles/labs and MD visits accordings every 2 weeks  All of the patients questions were answered with apparent satisfaction. The patient knows to call the clinic with any problems, questions or concerns.  The total time spent in the appt was 25 minutes and more than 50% was on counseling and direct patient cares.    Sullivan Lone MD MS AAHIVMS [facility] Hematology/Oncology Physician [facility]  (Office):       [phone] (Work cell):  [phone] (Fax):           [phone]  10/14/2018 11:31 AM  I, Soijett Blue am acting as scribe for Dr. Sullivan Lone.  .I have reviewed the above documentation for accuracy and completeness, and I agree with the above. Brunetta Genera MD

## 2018-10-14 ENCOUNTER — Telehealth: Payer: Self-pay | Admitting: Hematology

## 2018-10-14 ENCOUNTER — Inpatient Hospital Stay: Payer: Medicaid Other

## 2018-10-14 ENCOUNTER — Inpatient Hospital Stay (HOSPITAL_BASED_OUTPATIENT_CLINIC_OR_DEPARTMENT_OTHER): Payer: Medicaid Other | Admitting: Hematology

## 2018-10-14 VITALS — BP 159/95 | HR 100 | Temp 97.9°F | Resp 18 | Ht 70.0 in | Wt 361.1 lb

## 2018-10-14 DIAGNOSIS — Z87891 Personal history of nicotine dependence: Secondary | ICD-10-CM

## 2018-10-14 DIAGNOSIS — C787 Secondary malignant neoplasm of liver and intrahepatic bile duct: Principal | ICD-10-CM

## 2018-10-14 DIAGNOSIS — Z5111 Encounter for antineoplastic chemotherapy: Secondary | ICD-10-CM | POA: Diagnosis not present

## 2018-10-14 DIAGNOSIS — E538 Deficiency of other specified B group vitamins: Secondary | ICD-10-CM

## 2018-10-14 DIAGNOSIS — D709 Neutropenia, unspecified: Secondary | ICD-10-CM

## 2018-10-14 DIAGNOSIS — I272 Pulmonary hypertension, unspecified: Secondary | ICD-10-CM

## 2018-10-14 DIAGNOSIS — D509 Iron deficiency anemia, unspecified: Secondary | ICD-10-CM

## 2018-10-14 DIAGNOSIS — C269 Malignant neoplasm of ill-defined sites within the digestive system: Secondary | ICD-10-CM

## 2018-10-14 DIAGNOSIS — C189 Malignant neoplasm of colon, unspecified: Secondary | ICD-10-CM

## 2018-10-14 DIAGNOSIS — Z7189 Other specified counseling: Secondary | ICD-10-CM

## 2018-10-14 LAB — CMP (CANCER CENTER ONLY)
ALT: 9 U/L (ref 0–44)
ANION GAP: 8 (ref 5–15)
AST: 11 U/L — ABNORMAL LOW (ref 15–41)
Albumin: 2.9 g/dL — ABNORMAL LOW (ref 3.5–5.0)
Alkaline Phosphatase: 84 U/L (ref 38–126)
BUN: 13 mg/dL (ref 6–20)
CO2: 28 mmol/L (ref 22–32)
Calcium: 9.1 mg/dL (ref 8.9–10.3)
Chloride: 107 mmol/L (ref 98–111)
Creatinine: 0.96 mg/dL (ref 0.44–1.00)
GFR, Est AFR Am: >60 mL/min (ref >60)
GFR, Estimated: >60 mL/min (ref >60)
Glucose, Bld: 86 mg/dL (ref 70–99)
POTASSIUM: 4 mmol/L (ref 3.5–5.1)
Sodium: 143 mmol/L (ref 135–145)
Total Bilirubin: 0.3 mg/dL (ref 0.3–1.2)
Total Protein: 7.2 g/dL (ref 6.5–8.1)

## 2018-10-14 LAB — CBC WITH DIFFERENTIAL/PLATELET
Abs Immature Granulocytes: 0.03 10*3/uL (ref 0.00–0.07)
BASOS PCT: 0 %
Basophils Absolute: 0 10*3/uL (ref 0.0–0.1)
Eosinophils Absolute: 0 10*3/uL (ref 0.0–0.5)
Eosinophils Relative: 1 %
HCT: 33.7 % — ABNORMAL LOW (ref 36.0–46.0)
Hemoglobin: 10.2 g/dL — ABNORMAL LOW (ref 12.0–15.0)
Immature Granulocytes: 1 %
Lymphocytes Relative: 58 %
Lymphs Abs: 1.8 10*3/uL (ref 0.7–4.0)
MCH: 27.9 pg (ref 26.0–34.0)
MCHC: 30.3 g/dL (ref 30.0–36.0)
MCV: 92.3 fL (ref 80.0–100.0)
Monocytes Absolute: 0.4 10*3/uL (ref 0.1–1.0)
Monocytes Relative: 12 %
NEUTROS ABS: 0.9 10*3/uL — AB (ref 1.7–7.7)
Neutrophils Relative %: 28 %
PLATELETS: 208 10*3/uL (ref 150–400)
RBC: 3.65 MIL/uL — ABNORMAL LOW (ref 3.87–5.11)
RDW: 16.2 % — ABNORMAL HIGH (ref 11.5–15.5)
WBC: 3.2 10*3/uL — ABNORMAL LOW (ref 4.0–10.5)
nRBC: 0 % (ref 0.0–0.2)

## 2018-10-14 MED ORDER — PROCHLORPERAZINE MALEATE 10 MG PO TABS: 10.0000 mg | ORAL_TABLET | Freq: Four times a day (QID) | ORAL | 1 refills | Status: DC | PRN

## 2018-10-14 NOTE — Telephone Encounter (Signed)
Scheduled appt per 01/30 los. ° °Printed calendar and avs. °

## 2018-10-14 NOTE — Patient Instructions (Signed)
Thank you for choosing [facility] to provide your oncology and hematology care.  To afford each patient quality time with our providers, please arrive 30 minutes before your scheduled appointment time.  If you arrive late for your appointment, you may be asked to reschedule.  We strive to give you quality time with our providers, and arriving late affects you and other patients whose appointments are after yours.    If you are a no show for multiple scheduled visits, you may be dismissed from the clinic at the providers discretion.     Again, thank you for choosing [facility], our hope is that these requests will decrease the amount of time that you wait before being seen by our physicians.  ______________________________________________________________________   Should you have questions after your visit to the [facility], please contact our office at [phone] between the hours of 8:30 and 4:30 p.m.    Voicemails left after 4:30p.m will not be returned until the following business day.     For prescription refill requests, please have your pharmacy contact us directly.  Please also try to allow 48 hours for prescription requests.     Please contact the scheduling department for questions regarding scheduling.  For scheduling of procedures such as PET scans, CT scans, MRI, Ultrasound, etc please contact central scheduling at [phone].     Resources For Cancer Patients and Caregivers:    Oncolink.org:  A wonderful resource for patients and healthcare providers for information regarding your disease, ways to tract your treatment, what to expect, etc.      American Cancer Society:  800-227-2345  Can help patients locate various types of support and financial assistance   Cancer Care: 1-800-813-HOPE (4673) Provides financial assistance, online support groups, medication/co-pay assistance.     [place] DSS:  336-641-3447 Where to apply  for food stamps, Medicaid, and utility assistance   Medicare Rights Center: 800-333-4114 Helps people with Medicare understand their rights and benefits, navigate the Medicare system, and secure the quality healthcare they deserve   SCAT: 336-333-6589 [place] [employer]'s shared-ride transportation service for eligible riders who have a disability that prevents them from riding the fixed route bus.     For additional information on assistance programs please contact our social worker:   Margaret [name]:  [phone]  

## 2018-10-18 ENCOUNTER — Telehealth: Payer: Self-pay | Admitting: Hematology

## 2018-10-18 NOTE — Telephone Encounter (Signed)
Called patient to inform the patient of her treatment being added for 02/06.  Moved the patient lab appt for an earlier time and patient was okay with it.  Patient aware of time and date of appts.

## 2018-10-19 ENCOUNTER — Other Ambulatory Visit: Payer: Self-pay | Admitting: *Deleted

## 2018-10-20 ENCOUNTER — Other Ambulatory Visit: Payer: Self-pay | Admitting: *Deleted

## 2018-10-20 DIAGNOSIS — C787 Secondary malignant neoplasm of liver and intrahepatic bile duct: Principal | ICD-10-CM

## 2018-10-20 DIAGNOSIS — C189 Malignant neoplasm of colon, unspecified: Secondary | ICD-10-CM

## 2018-10-20 DIAGNOSIS — Z7189 Other specified counseling: Secondary | ICD-10-CM

## 2018-10-20 MED ORDER — ONDANSETRON HCL 8 MG PO TABS: 8.0000 mg | ORAL_TABLET | Freq: Two times a day (BID) | ORAL | 0 refills | Status: DC | PRN

## 2018-10-20 MED ORDER — DIAZEPAM 5 MG PO TABS: 5.0000 mg | ORAL_TABLET | Freq: Every day | ORAL | 0 refills | Status: DC | PRN

## 2018-10-20 NOTE — Telephone Encounter (Signed)
Refill request received from pharmacy for zofran

## 2018-10-21 ENCOUNTER — Inpatient Hospital Stay: Payer: Medicaid Other | Attending: Hematology

## 2018-10-21 ENCOUNTER — Ambulatory Visit: Payer: Medicaid Other

## 2018-10-21 ENCOUNTER — Other Ambulatory Visit: Payer: Medicaid Other

## 2018-10-21 ENCOUNTER — Encounter: Payer: Self-pay | Admitting: Hematology

## 2018-10-21 ENCOUNTER — Inpatient Hospital Stay: Payer: Medicaid Other

## 2018-10-21 VITALS — BP 123/77 | HR 79 | Temp 99.1°F | Resp 18

## 2018-10-21 DIAGNOSIS — C787 Secondary malignant neoplasm of liver and intrahepatic bile duct: Principal | ICD-10-CM

## 2018-10-21 DIAGNOSIS — D508 Other iron deficiency anemias: Secondary | ICD-10-CM | POA: Insufficient documentation

## 2018-10-21 DIAGNOSIS — E538 Deficiency of other specified B group vitamins: Secondary | ICD-10-CM | POA: Insufficient documentation

## 2018-10-21 DIAGNOSIS — I272 Pulmonary hypertension, unspecified: Secondary | ICD-10-CM | POA: Insufficient documentation

## 2018-10-21 DIAGNOSIS — D709 Neutropenia, unspecified: Secondary | ICD-10-CM | POA: Diagnosis not present

## 2018-10-21 DIAGNOSIS — C189 Malignant neoplasm of colon, unspecified: Secondary | ICD-10-CM

## 2018-10-21 DIAGNOSIS — Z7189 Other specified counseling: Secondary | ICD-10-CM

## 2018-10-21 DIAGNOSIS — Z5111 Encounter for antineoplastic chemotherapy: Secondary | ICD-10-CM | POA: Diagnosis present

## 2018-10-21 DIAGNOSIS — C180 Malignant neoplasm of cecum: Secondary | ICD-10-CM | POA: Diagnosis present

## 2018-10-21 LAB — CMP (CANCER CENTER ONLY)
ALK PHOS: 88 U/L (ref 38–126)
ALT: 10 U/L (ref 0–44)
AST: 15 U/L (ref 15–41)
Albumin: 2.8 g/dL — ABNORMAL LOW (ref 3.5–5.0)
Anion gap: 9 (ref 5–15)
BUN: 10 mg/dL (ref 6–20)
CALCIUM: 9.1 mg/dL (ref 8.9–10.3)
CO2: 26 mmol/L (ref 22–32)
Chloride: 110 mmol/L (ref 98–111)
Creatinine: 0.91 mg/dL (ref 0.44–1.00)
GFR, Est AFR Am: >60 mL/min (ref >60)
GFR, Estimated: >60 mL/min (ref >60)
Glucose, Bld: 74 mg/dL (ref 70–99)
Potassium: 4.1 mmol/L (ref 3.5–5.1)
Sodium: 145 mmol/L (ref 135–145)
TOTAL PROTEIN: 7.2 g/dL (ref 6.5–8.1)
Total Bilirubin: 0.5 mg/dL (ref 0.3–1.2)

## 2018-10-21 LAB — CBC WITH DIFFERENTIAL/PLATELET
Abs Immature Granulocytes: 0.16 10*3/uL — ABNORMAL HIGH (ref 0.00–0.07)
Basophils Absolute: 0 10*3/uL (ref 0.0–0.1)
Basophils Relative: 0 %
EOS ABS: 0 10*3/uL (ref 0.0–0.5)
Eosinophils Relative: 1 %
HCT: 33.7 % — ABNORMAL LOW (ref 36.0–46.0)
Hemoglobin: 10.3 g/dL — ABNORMAL LOW (ref 12.0–15.0)
Immature Granulocytes: 2 %
Lymphocytes Relative: 31 %
Lymphs Abs: 2.3 10*3/uL (ref 0.7–4.0)
MCH: 28.2 pg (ref 26.0–34.0)
MCHC: 30.6 g/dL (ref 30.0–36.0)
MCV: 92.3 fL (ref 80.0–100.0)
Monocytes Absolute: 0.5 10*3/uL (ref 0.1–1.0)
Monocytes Relative: 6 %
Neutro Abs: 4.4 10*3/uL (ref 1.7–7.7)
Neutrophils Relative %: 60 %
PLATELETS: 222 10*3/uL (ref 150–400)
RBC: 3.65 MIL/uL — ABNORMAL LOW (ref 3.87–5.11)
RDW: 16.7 % — AB (ref 11.5–15.5)
WBC: 7.3 10*3/uL (ref 4.0–10.5)
nRBC: 0 % (ref 0.0–0.2)

## 2018-10-21 MED ORDER — PALONOSETRON HCL INJECTION 0.25 MG/5ML
0.2500 mg | Freq: Once | INTRAVENOUS | Status: AC
  Administered 2018-10-21: 0.25 mg via INTRAVENOUS

## 2018-10-21 MED ORDER — DEXAMETHASONE SODIUM PHOSPHATE 10 MG/ML IJ SOLN
INTRAMUSCULAR | Status: AC
  Filled 2018-10-21: qty 1

## 2018-10-21 MED ORDER — DEXAMETHASONE SODIUM PHOSPHATE 10 MG/ML IJ SOLN
10.0000 mg | Freq: Once | INTRAMUSCULAR | Status: AC
  Administered 2018-10-21: 10 mg via INTRAVENOUS

## 2018-10-21 MED ORDER — FLUOROURACIL CHEMO INJECTION 2.5 GM/50ML
400.0000 mg/m2 | Freq: Once | INTRAVENOUS | Status: AC
  Administered 2018-10-21: 1150 mg via INTRAVENOUS
  Filled 2018-10-21: qty 23

## 2018-10-21 MED ORDER — OXALIPLATIN CHEMO INJECTION 100 MG/20ML
86.0000 mg/m2 | Freq: Once | INTRAVENOUS | Status: AC
  Administered 2018-10-21: 250 mg via INTRAVENOUS
  Filled 2018-10-21: qty 40

## 2018-10-21 MED ORDER — LEUCOVORIN CALCIUM INJECTION 350 MG
400.0000 mg/m2 | Freq: Once | INTRAVENOUS | Status: AC
  Administered 2018-10-21: 1156 mg via INTRAVENOUS
  Filled 2018-10-21: qty 40.3

## 2018-10-21 MED ORDER — DEXTROSE 5 % IV SOLN
Freq: Once | INTRAVENOUS | Status: AC
  Administered 2018-10-21: 14:00:00 via INTRAVENOUS
  Filled 2018-10-21: qty 250

## 2018-10-21 MED ORDER — SODIUM CHLORIDE 0.9 % IV SOLN
2400.0000 mg/m2 | INTRAVENOUS | Status: DC
  Administered 2018-10-21: 6950 mg via INTRAVENOUS
  Filled 2018-10-21: qty 139

## 2018-10-21 MED ORDER — PALONOSETRON HCL INJECTION 0.25 MG/5ML
INTRAVENOUS | Status: AC
  Filled 2018-10-21: qty 5

## 2018-10-21 NOTE — Patient Instructions (Signed)
[facility] Discharge Instructions for Patients Receiving Chemotherapy  Today you received the following chemotherapy agents: Oxaliplatin, Leucovorin, and 5FU.  To help prevent nausea and vomiting after your treatment, we encourage you to take your nausea medication as directed.   If you develop nausea and vomiting that is not controlled by your nausea medication, call the clinic.   BELOW ARE SYMPTOMS THAT SHOULD BE REPORTED IMMEDIATELY:  *FEVER GREATER THAN 100.5 F  *CHILLS WITH OR WITHOUT FEVER  NAUSEA AND VOMITING THAT IS NOT CONTROLLED WITH YOUR NAUSEA MEDICATION  *UNUSUAL SHORTNESS OF BREATH  *UNUSUAL BRUISING OR BLEEDING  TENDERNESS IN MOUTH AND THROAT WITH OR WITHOUT PRESENCE OF ULCERS  *URINARY PROBLEMS  *BOWEL PROBLEMS  UNUSUAL RASH Items with * indicate a potential emergency and should be followed up as soon as possible.  Feel free to call the clinic should you have any questions or concerns. The clinic phone number is [phone].  Please show the CHEMO ALERT CARD at check-in to the Emergency Department and triage nurse.    

## 2018-10-21 NOTE — Progress Notes (Signed)
Spoke to Dr. Irene Limbo regarding orders for FOLFOX to be given over 4 hours. Dr. Irene Limbo states, "She had a significant event after her last treatment. Let's keep it at 4 hours." Raul Del, RPh notified.

## 2018-10-21 NOTE — Progress Notes (Signed)
Verbal orders obtained from Dr. Irene Limbo to run 5FU pump over 43 hours at a rate of 5.62ml/hr.

## 2018-10-22 ENCOUNTER — Telehealth: Payer: Self-pay | Admitting: *Deleted

## 2018-10-22 ENCOUNTER — Telehealth: Payer: Self-pay | Admitting: General Practice

## 2018-10-22 NOTE — Telephone Encounter (Signed)
Called patient in response to MyChart message to confirm appt time for pump stop tomorrow.

## 2018-10-22 NOTE — Telephone Encounter (Signed)
[place] [place] CSW Progress Notes  Call from patient. Concerned her [facility] transport needs to be adjusted to meet new appt at 2 PM 2/8 at [facility].  Looked at [facility] schedule, pick up time at home is 1:12 w arrival time at [facility] of 1:30.  Ride appears to be scheduled correctly.  Edwyna Shell, LCSW Clinical Social Worker Phone:  [phone]

## 2018-10-23 ENCOUNTER — Inpatient Hospital Stay: Payer: Medicaid Other

## 2018-10-23 VITALS — BP 131/83 | HR 65 | Temp 98.5°F | Resp 18

## 2018-10-23 DIAGNOSIS — C189 Malignant neoplasm of colon, unspecified: Secondary | ICD-10-CM

## 2018-10-23 DIAGNOSIS — Z7189 Other specified counseling: Secondary | ICD-10-CM

## 2018-10-23 DIAGNOSIS — C787 Secondary malignant neoplasm of liver and intrahepatic bile duct: Principal | ICD-10-CM

## 2018-10-23 DIAGNOSIS — Z5111 Encounter for antineoplastic chemotherapy: Secondary | ICD-10-CM | POA: Diagnosis not present

## 2018-10-23 MED ORDER — SODIUM CHLORIDE 0.9% FLUSH
10.0000 mL | INTRAVENOUS | Status: DC | PRN
  Administered 2018-10-23: 10 mL
  Filled 2018-10-23: qty 10

## 2018-10-23 MED ORDER — HEPARIN SOD (PORK) LOCK FLUSH 100 UNIT/ML IV SOLN
500.0000 [IU] | Freq: Once | INTRAVENOUS | Status: AC | PRN
  Administered 2018-10-23: 500 [IU]
  Filled 2018-10-23: qty 5

## 2018-10-25 ENCOUNTER — Telehealth: Payer: Self-pay | Admitting: Hematology

## 2018-10-25 NOTE — Telephone Encounter (Signed)
Spoke with patient per 2/6 sch message - lengthen treatment - per patient unable to come in earlier than 1045 - sent message to RN and Lurlean Horns to see what we can do to accommodate

## 2018-10-28 ENCOUNTER — Ambulatory Visit: Payer: Medicaid Other | Admitting: Hematology

## 2018-10-28 ENCOUNTER — Other Ambulatory Visit: Payer: Medicaid Other

## 2018-10-28 ENCOUNTER — Ambulatory Visit: Payer: Medicaid Other

## 2018-11-03 ENCOUNTER — Inpatient Hospital Stay: Payer: Medicaid Other

## 2018-11-03 DIAGNOSIS — Z95828 Presence of other vascular implants and grafts: Secondary | ICD-10-CM

## 2018-11-03 DIAGNOSIS — D509 Iron deficiency anemia, unspecified: Secondary | ICD-10-CM

## 2018-11-03 DIAGNOSIS — Z7189 Other specified counseling: Secondary | ICD-10-CM

## 2018-11-03 DIAGNOSIS — C787 Secondary malignant neoplasm of liver and intrahepatic bile duct: Principal | ICD-10-CM

## 2018-11-03 DIAGNOSIS — Z5111 Encounter for antineoplastic chemotherapy: Secondary | ICD-10-CM | POA: Diagnosis not present

## 2018-11-03 DIAGNOSIS — C189 Malignant neoplasm of colon, unspecified: Secondary | ICD-10-CM

## 2018-11-03 LAB — CMP (CANCER CENTER ONLY)
ALT: 11 U/L (ref 0–44)
AST: 19 U/L (ref 15–41)
Albumin: 2.8 g/dL — ABNORMAL LOW (ref 3.5–5.0)
Alkaline Phosphatase: 83 U/L (ref 38–126)
Anion gap: 8 (ref 5–15)
BUN: 12 mg/dL (ref 6–20)
CALCIUM: 8.8 mg/dL — AB (ref 8.9–10.3)
CO2: 25 mmol/L (ref 22–32)
Chloride: 109 mmol/L (ref 98–111)
Creatinine: 0.8 mg/dL (ref 0.44–1.00)
GFR, Est AFR Am: >60 mL/min (ref >60)
Glucose, Bld: 90 mg/dL (ref 70–99)
Potassium: 4.1 mmol/L (ref 3.5–5.1)
Sodium: 142 mmol/L (ref 135–145)
Total Bilirubin: 0.4 mg/dL (ref 0.3–1.2)
Total Protein: 6.9 g/dL (ref 6.5–8.1)

## 2018-11-03 LAB — CBC WITH DIFFERENTIAL/PLATELET
Abs Immature Granulocytes: 0.01 10*3/uL (ref 0.00–0.07)
BASOS PCT: 0 %
Basophils Absolute: 0 10*3/uL (ref 0.0–0.1)
EOS ABS: 0.1 10*3/uL (ref 0.0–0.5)
Eosinophils Relative: 2 %
HCT: 31.1 % — ABNORMAL LOW (ref 36.0–46.0)
Hemoglobin: 9.5 g/dL — ABNORMAL LOW (ref 12.0–15.0)
Immature Granulocytes: 0 %
Lymphocytes Relative: 39 %
Lymphs Abs: 1.3 10*3/uL (ref 0.7–4.0)
MCH: 28 pg (ref 26.0–34.0)
MCHC: 30.5 g/dL (ref 30.0–36.0)
MCV: 91.7 fL (ref 80.0–100.0)
Monocytes Absolute: 0.3 10*3/uL (ref 0.1–1.0)
Monocytes Relative: 8 %
Neutro Abs: 1.6 10*3/uL — ABNORMAL LOW (ref 1.7–7.7)
Neutrophils Relative %: 51 %
PLATELETS: 162 10*3/uL (ref 150–400)
RBC: 3.39 MIL/uL — ABNORMAL LOW (ref 3.87–5.11)
RDW: 16.7 % — ABNORMAL HIGH (ref 11.5–15.5)
WBC: 3.2 10*3/uL — ABNORMAL LOW (ref 4.0–10.5)
nRBC: 0 % (ref 0.0–0.2)

## 2018-11-03 LAB — FERRITIN: Ferritin: 540 ng/mL — ABNORMAL HIGH (ref 11–307)

## 2018-11-03 MED ORDER — HEPARIN SOD (PORK) LOCK FLUSH 100 UNIT/ML IV SOLN
500.0000 [IU] | Freq: Once | INTRAVENOUS | Status: AC
  Administered 2018-11-03: 500 [IU]
  Filled 2018-11-03: qty 5

## 2018-11-03 MED ORDER — SODIUM CHLORIDE 0.9% FLUSH
10.0000 mL | Freq: Once | INTRAVENOUS | Status: AC
  Administered 2018-11-03: 10 mL
  Filled 2018-11-03: qty 10

## 2018-11-03 NOTE — Progress Notes (Signed)
HEMATOLOGY/ONCOLOGY CLINIC NOTE  Date of Service: 11/04/2018  Patient Care Team: Nolene Ebbs, MD as PCP - General (Internal Medicine) Jackelyn Knife, MD as Rounding Team (Internal Medicine)  CHIEF COMPLAINTS/PURPOSE OF CONSULTATION:  Recently diagnosed Metastatic Adenocarcinoma of colon  HISTORY OF PRESENTING ILLNESS:   Margaret [name] is a wonderful 48 y.o. female who has been referred to [place] by Dr. Nolene Ebbs for evaluation and management of Iron deficiency anemia. The pt reports that she is doing well overall.   The pt recently presented to the ED on 04/16/18 for right sided abdominal pain that was evaluated with a CT A/P which revealed liver and pancreatic cyst, recommended for outpatient MRI follow up. The pt was found to have a UTI, was treated with Rocephin and discharged with Keflex. Prior to this the pt was admitted on 04/09/18 for symptomatic anemia, presenting with HGB at 6.2, received 2 units PRBCs, one IV Iron infusion, and was encouraged to seek out GI as outpatient.  She has an appointment with [facility] on 05/19/18.   The pt reports that she is still feeling dizzy and light headed and denies feeling better after her recent blood transfusion. She notes that she dizziness presents with a feeling of warmness and that she begins hyperventilating, feeling nervous about her dizziness.  She notes that prior to her recent hospital stay, she never required a blood transfusion or IV iron replacement. She notes that as a child she had frequent nose bleeds, and was diagnosed with anemia. She also notes a history of ice picca.   She had a hysterectomy in 2012, and prior to this she had very heavy periods that began at age 56 and occurred almost constantly. She was on Depo and Mirena which did not help slow menstrual losses. Besides taking iron supplements while pregnant she has not taken PO iron replacement as she reports not tolerating it very well while pregnant.   She denies  concern for black stools or blood in the stools and has not had a colonoscopy or endoscopy before. The pt notes that she has not previously had a concern for stomach ulcers, but has acid reflux and takes Prevacid as needed for 7-8 years. She notes that she has not recently used Prevacid, for the last 3 months.  She is not taking vitamin replacements and denies any dietary restrictions.  The pt notes that she has been continuing to have lower right quadrant abdominal pain that radiates across her abdomen, presents for up to 45 minutes and occurs intermittently. She notes that her abdominal pain presented on 04/13/18.   The pt notes that was taking Ibuprofen 800mg  BID for 8 years to address her back pain related to her herniated disk. She stopped taking this 4 months ago.   Of note prior to the patient's visit today, pt has had CT A/P completed on 04/16/18 with results revealing No acute findings in the abdomen/pelvis. Least 4 low-density hepatic lesions, some of which may represent small cysts or hemangiomas, however there is a 19 mm lesion in the left lobe of the liver that is incompletely characterized. Recommend nonemergent hepatic protocol MRI for further evaluation.Marland Kitchen Possible 11 mm pancreatic nodule rising from the tail, alternatively this may represent a splenule. This can also be assessed at time of hepatic MRI. Gallstone without gallbladder inflammation. Colonic diverticulosis without diverticulitis.   Most recent lab results (04/16/18) of CBC is as follows: all values are WNL except for HGB at 9.5, HCT at 33.6,  MCH at 23.8, MCHC at 28.3, RDW at 23.2, PLT at 435k. Ferritin 04/09/18 was low at 6 Vitamin B12 on 04/09/18 was at 196  On review of systems, pt reports light headedness, dizziness, lower back pain, intermittent abdominal pain, and denies black stools, blood in the stools, tingling or numbness in her legs or arms, vaginal bleeding, and any other symptoms.  Interval History:   Margaret [name] returns today for management, evaluation, and C4 FOLFOX treatment of her newly diagnosed Metastatic Adenocarcinoma of GI primary. The patient's last visit with [place] was on 10/14/18. The pt reports that she is doing well overall.   The pt reports that she is intending to pursue a colonoscopy in March with GI. She denies abdominal pains, blood in the stools, or black stools. She notes that she has continued eating well and is staying well hydrated. She endorses stable energy levels. She notes that compazine has helped to control her nausea. She notes that she is walking at least 20-30 minutes every day, up to 45 minutes.  Lab results (11/03/18) of CBC w/diff and CMP is as follows: all values are WNL except for WBC at 3.2k, RBC at 3.39, HGB at 9.5, HCT at 31.1, RDW at 16.7, ANC at 1.6k, Calcium at 8.8, Albumin at 2.8. 11/03/18 Ferritin at 540  On review of systems, pt reports moving her bowels well, stable energy levels, eating well, controlled nausea, staying active, and denies abdominal pains, blood in the stools, constipation, black stools, dizziness, leg swelling, and any other symptoms.    MEDICAL HISTORY:  Past Medical History:  Diagnosis Date  . Allergic rhinitis   . Anxiety diagnosed in 1990  . Arthritis   . Asthma   . Dyspnea    with low blood count hx of anemia  . Eczema   . History of blood transfusion   . Hypertension   . Lower back pain   . Migraine   . Sleep apnea    uses CPAP    SURGICAL HISTORY: Past Surgical History:  Procedure Laterality Date  . ABDOMINAL HYSTERECTOMY    . cortisone injections     knees bilat and back   . IR IMAGING GUIDED PORT INSERTION  09/14/2018  . iron infusion    . LIVER BIOPSY      SOCIAL HISTORY: Social History   Socioeconomic History  . Marital status: Single    Spouse name: Not on file  . Number of children: Not on file  . Years of education: Not on file  . Highest education level: Not on file  Occupational History  . Not  on file  Social Needs  . Financial resource strain: Not on file  . Food insecurity:    Worry: Not on file    Inability: Not on file  . Transportation needs:    Medical: Not on file    Non-medical: Not on file  Tobacco Use  . Smoking status: Former Smoker    Last attempt to quit: 07/30/1999    Years since quitting: 19.2  . Smokeless tobacco: Never Used  Substance and Sexual Activity  . Alcohol use: No  . Drug use: No  . Sexual activity: Not on file  Lifestyle  . Physical activity:    Days per week: Not on file    Minutes per session: Not on file  . Stress: Not on file  Relationships  . Social connections:    Talks on phone: Not on file    Gets together:  Not on file    Attends religious service: Not on file    Active member of club or organization: Not on file    Attends meetings of clubs or organizations: Not on file    Relationship status: Not on file  . Intimate partner violence:    Fear of current or ex partner: Not on file    Emotionally abused: Not on file    Physically abused: Not on file    Forced sexual activity: Not on file  Other Topics Concern  . Not on file  Social History Narrative  . Not on file    FAMILY HISTORY: No family history on file.  ALLERGIES:  is allergic to latex; tape; and tylenol with codeine #3 [acetaminophen-codeine].  MEDICATIONS:  Current Outpatient Medications  Medication Sig Dispense Refill  . albuterol (PROAIR HFA) 108 (90 Base) MCG/ACT inhaler INHALE TWO puffs BY MOUTH into the lungs EVERY 4 HOURS AS NEEDED FOR WHEEZING OR shortness of breath 18 g 1  . amoxicillin-clavulanate (AUGMENTIN) 875-125 MG tablet Take 1 tablet by mouth every 12 (twelve) hours. 10 tablet 0  . Boric Acid 4 % SOLN Place 1 capsule vaginally every Friday.     . cetirizine (ZYRTEC) 10 MG tablet Take 1 tablet (10 mg total) by mouth daily. 30 tablet 6  . clobetasol ointment (TEMOVATE) 8.11 % Apply 1 application topically 2 (two) times daily. Use for Lichen  Sclerosis    . Cyanocobalamin (B-12) 1000 MCG SUBL Place 1,000 mcg under the tongue daily. 30 each 3  . cyclobenzaprine (FLEXERIL) 10 MG tablet Take 10 mg by mouth 3 (three) times daily as needed (muscle spasms.).     Marland Kitchen dexamethasone (DECADRON) 4 MG tablet Take 2 tablets (8 mg total) by mouth daily. Start the day after chemotherapy for 2 days. Take with food. 30 tablet 1  . diazepam (VALIUM) 5 MG tablet Take 1 tablet (5 mg total) by mouth daily as needed for anxiety (panic attacks). 30 tablet 0  . diclofenac sodium (VOLTAREN) 1 % GEL Apply 2 g topically 4 (four) times daily as needed (for back/knee pain.).     Marland Kitchen dicyclomine (BENTYL) 20 MG tablet Take 1 tablet (20 mg total) by mouth 2 (two) times daily. (Patient taking differently: Take 20 mg by mouth 3 (three) times daily before meals. ) 20 tablet 0  . ergocalciferol (VITAMIN D2) 50000 UNITS capsule Take 50,000 Units by mouth every Monday.     . lidocaine-prilocaine (EMLA) cream Apply to affected area once 30 g 3  . lisinopril-hydrochlorothiazide (PRINZIDE,ZESTORETIC) 20-12.5 MG tablet Take 1 tablet by mouth daily.    . methocarbamol (ROBAXIN) 750 MG tablet Take 750 mg by mouth every 8 (eight) hours as needed (back spasms.).     Marland Kitchen montelukast (SINGULAIR) 10 MG tablet Take 1 tablet (10 mg total) by mouth at bedtime. 30 tablet 6  . nystatin (NYSTATIN) powder Apply 1 g topically 2 (two) times daily as needed (irritation). Use in skin folds and under breast     . nystatin-triamcinolone (MYCOLOG II) cream Apply 1 application topically 2 (two) times daily as needed (irritation). Use in skin folds and under breast     . ondansetron (ZOFRAN) 8 MG tablet Take 1 tablet (8 mg total) by mouth 2 (two) times daily as needed for refractory nausea / vomiting. Start on day 3 after chemotherapy. 30 tablet 0  . pantoprazole (PROTONIX) 40 MG tablet Take 40 mg by mouth daily before breakfast.   1  .  PATADAY 0.2 % SOLN Apply 2 drops to eye 2 (two) times daily as needed  (for irritated eyes.). 2.5 mL 5  . pimecrolimus (ELIDEL) 1 % cream Apply 1 application topically 2 (two) times daily. 30 g 6  . prochlorperazine (COMPAZINE) 10 MG tablet Take 1 tablet (10 mg total) by mouth every 6 (six) hours as needed (Nausea or vomiting). 30 tablet 1  . saccharomyces boulardii (FLORASTOR) 250 MG capsule Take 1 capsule (250 mg total) by mouth 2 (two) times daily.    . SYMBICORT 160-4.5 MCG/ACT inhaler Inhale 2 puffs into the lungs 2 (two) times daily. (Patient taking differently: Inhale 2 puffs into the lungs 2 (two) times daily. ) 10.2 g 3  . tiZANidine (ZANAFLEX) 4 MG tablet Take 4 mg by mouth every 8 (eight) hours as needed for spasms.    . traMADol (ULTRAM) 50 MG tablet Take 50 mg by mouth every 6 (six) hours as needed for moderate pain (back pain.).     Marland Kitchen triamcinolone (NASACORT) 55 MCG/ACT AERO nasal inhaler Place 2 sprays into the nose daily as needed (for allergies). 1 Inhaler 3  . valACYclovir (VALTREX) 500 MG tablet Take 500 mg by mouth 2 (two) times daily as needed (for cold sores).     . vitamin B-12 1000 MCG tablet Take 1 tablet (1,000 mcg total) by mouth daily.     No current facility-administered medications for this visit.    Facility-Administered Medications Ordered in Other Visits  Medication Dose Route Frequency Provider Last Rate Last Dose  . fluorouracil (ADRUCIL) 6,950 mg in sodium chloride 0.9 % 111 mL chemo infusion  2,400 mg/m2 (Treatment Plan Recorded) Intravenous 1 day or 1 dose Brunetta Genera, MD      . fluorouracil (ADRUCIL) chemo injection 1,150 mg  400 mg/m2 (Treatment Plan Recorded) Intravenous Once Brunetta Genera, MD      . leucovorin 1,156 mg in dextrose 5 % 250 mL infusion  400 mg/m2 (Treatment Plan Recorded) Intravenous Once Brunetta Genera, MD      . oxaliplatin (ELOXATIN) 250 mg in dextrose 5 % 1,000 mL chemo infusion  86 mg/m2 (Treatment Plan Recorded) Intravenous Once Brunetta Genera, MD        REVIEW OF SYSTEMS:      A 10+ POINT REVIEW OF SYSTEMS WAS OBTAINED including neurology, dermatology, psychiatry, cardiac, respiratory, lymph, extremities, GI, GU, Musculoskeletal, constitutional, breasts, reproductive, HEENT.  All pertinent positives are noted in the HPI.  All others are negative.   PHYSICAL EXAMINATION:   GENERAL:alert, in no acute distress and comfortable SKIN: no acute rashes, no significant lesions EYES: conjunctiva are pink and non-injected, sclera anicteric OROPHARYNX: MMM, no exudates, no oropharyngeal erythema or ulceration NECK: supple, no JVD LYMPH:  no palpable lymphadenopathy in the cervical, axillary or inguinal regions LUNGS: clear to auscultation b/l with normal respiratory effort HEART: regular rate & rhythm ABDOMEN:  normoactive bowel sounds , non tender, not distended. No palpable hepatosplenomegaly.  Extremity: no pedal edema PSYCH: alert & oriented x 3 with fluent speech NEURO: no focal motor/sensory deficits   LABORATORY DATA:  I have reviewed the data as listed  . CBC Latest Ref Rng & Units 11/03/2018 10/21/2018 10/14/2018  WBC 4.0 - 10.5 K/uL 3.2(L) 7.3 3.2(L)  Hemoglobin 12.0 - 15.0 g/dL 9.5(L) 10.3(L) 10.2(L)  Hematocrit 36.0 - 46.0 % 31.1(L) 33.7(L) 33.7(L)  Platelets 150 - 400 K/uL 162 222 208   ANC 900 . CMP Latest Ref Rng & Units 11/03/2018 10/21/2018 10/14/2018  Glucose 70 - 99 mg/dL 90 74 86  BUN 6 - 20 mg/dL 12 10 13   Creatinine 0.44 - 1.00 mg/dL 0.80 0.91 0.96  Sodium 135 - 145 mmol/L 142 145 143  Potassium 3.5 - 5.1 mmol/L 4.1 4.1 4.0  Chloride 98 - 111 mmol/L 109 110 107  CO2 22 - 32 mmol/L 25 26 28   Calcium 8.9 - 10.3 mg/dL 8.8(L) 9.1 9.1  Total Protein 6.5 - 8.1 g/dL 6.9 7.2 7.2  Total Bilirubin 0.3 - 1.2 mg/dL 0.4 0.5 0.3  Alkaline Phos 38 - 126 U/L 83 88 84  AST 15 - 41 U/L 19 15 11(L)  ALT 0 - 44 U/L 11 10 9    . Lab Results  Component Value Date   IRON 24 (L) 09/06/2018   TIBC 156 (L) 09/06/2018   IRONPCTSAT 15 (L) 09/06/2018   (Iron and  TIBC)  Lab Results  Component Value Date   FERRITIN 540 (H) 11/03/2018   B12  - 196--> 584  07/22/18 Liver Biopsy:   08/27/18 Repeat Liver Biopsy:    RADIOGRAPHIC STUDIES: I have personally reviewed the radiological images as listed and agreed with the findings in the report. No results found.  ASSESSMENT & PLAN:   48 y.o. female with  1. Iron Deficiency Anemia - ? Previous heavy periods vs GI losses  Recent heavy NSAID use ? Ulcer  2. B12 deficiency Labs upon initial presentation from 04/16/18, HGB at 9.5. The 04/09/18 Ferritin was low at 6 and Vitamin B12 was at 196 -No antiparietal antibodies, no intrinsic factor antibodies, normal TSH all from 04/28/18  PLAN: -Continue NSAID avoidance  -continue 1012mcg Vitamin B12 sublingually daily - B12 improved from 196 to 584  3.MetastaticAdenocarcinoma of GI primary  04/16/18 CT A/P with pt revealed No acute findings in the abdomen/pelvis. Least 4 low-density hepatic lesions, some of which may represent small cysts or hemangiomas, however there is a 19 mm lesion in the left lobe of the liver that is incompletely characterized. Recommend nonemergent hepatic protocol MRI for further evaluation.Marland Kitchen Possible 11 mm pancreatic nodule rising from the tail, alternatively this may represent a splenule. This can also be assessed at time of hepatic MRI. Gallstone without gallbladder inflammation. Colonic diverticulosis without diverticulitis.   07/09/18 Tumor marker work up: CEA normal at 1.32, AFP normal at 1.5, LDH normal at 138, CA 125 normal at 5.9, CA 19-9 normal at <2   07/22/18 Liver biopsy results revealed benign hepatic tissue, and was not diagnostic   07/16/18 PET/CT revealedThere is a solid hypermetabolic mass involving the cecum, worrisome for colonic primary neoplasm. Correlation withcolonoscopy Findings. 2. Multiple hypermetabolic liver lesions compatible with metastaticdisease. 3. Large low-density lesion in right lobe of  thyroid gland. Advise further evaluation with thyroid sonography. 4. Aortic Atherosclerosis. 5. Small hiatal hernia.   08/27/18 Liver biopsy revealed Metastatic adenocarcinoma, consistent with gastrointestinal primary   09/03/18 ECHO revealed an LV EF of 95-28%, mild diastolic dysfunction; mild RVE; dilated PA; mild TR; moderate to severe pulmonary hypertension  09/17/18 CEA at 2.39, which was the last CEA pre-treatment value   4. Neutropenia  PLAN: -Discussed pt labwork from yesterday 11/03/18; ANC borderline low at 1.6k, other blood counts and chemistries are stable. Ferritin at 540. -The pt has no prohibitive toxicities from continuing C4 FOLFOX at this time. -Discussed that there is not urgency from my perspective for the pt to have a colonoscopy at this time -Will plant for repeat CT after completing C6 to evaluate treatment efficacy  and for treatment planning -She has been using 16 ounces of water, a teaspoon of salt, a teaspoon of baking soda and gargling as needed.  -Discussed again that pt should avoid cold objects and fluids during treatment with FOLFOX, which she has done.  -given neurotoxicity from Oxaloplatin - grade 1-2 --- would give oxaloplatin over 4 hours instead of 2hours -Will evaluate further treatment options as the pending genetic testing results  -Recommend saline sprays OTC to prevent further epistaxis  -Recommend soft foods that are easy to digest  -Recommended that the pt continue to eat well, drink at least 48-64 oz of water each day, and walk 20-30 minutes each day.  -Will see the pt back in 4 weeks   Please schedule next 2 FOLFOX treatments as per orders with labs RTC with Dr Irene Limbo in 4 weeks Port flush appointment with each lab    All of the patients questions were answered with apparent satisfaction. The patient knows to call the clinic with any problems, questions or concerns.  The total time spent in the appt was 25 minutes and more than 50% was on  counseling and direct patient cares.    Sullivan Lone MD MS AAHIVMS [facility] Hematology/Oncology Physician [facility]  (Office):       [phone] (Work cell):  [phone] (Fax):           [phone]  11/04/2018 11:48 AM  I, Baldwin [place], am acting as a scribe for Dr. Sullivan Lone.   .I have reviewed the above documentation for accuracy and completeness, and I agree with the above. Brunetta Genera MD

## 2018-11-04 ENCOUNTER — Other Ambulatory Visit: Payer: Medicaid Other

## 2018-11-04 ENCOUNTER — Telehealth: Payer: Self-pay | Admitting: Hematology

## 2018-11-04 ENCOUNTER — Ambulatory Visit: Payer: Medicaid Other

## 2018-11-04 ENCOUNTER — Inpatient Hospital Stay (HOSPITAL_BASED_OUTPATIENT_CLINIC_OR_DEPARTMENT_OTHER): Payer: Medicaid Other | Admitting: Hematology

## 2018-11-04 ENCOUNTER — Inpatient Hospital Stay: Payer: Medicaid Other

## 2018-11-04 VITALS — BP 124/83 | HR 83 | Temp 97.7°F | Resp 18

## 2018-11-04 DIAGNOSIS — Z7189 Other specified counseling: Secondary | ICD-10-CM

## 2018-11-04 DIAGNOSIS — Z5111 Encounter for antineoplastic chemotherapy: Secondary | ICD-10-CM | POA: Diagnosis not present

## 2018-11-04 DIAGNOSIS — C189 Malignant neoplasm of colon, unspecified: Secondary | ICD-10-CM

## 2018-11-04 DIAGNOSIS — C180 Malignant neoplasm of cecum: Secondary | ICD-10-CM

## 2018-11-04 DIAGNOSIS — C787 Secondary malignant neoplasm of liver and intrahepatic bile duct: Principal | ICD-10-CM

## 2018-11-04 DIAGNOSIS — D709 Neutropenia, unspecified: Secondary | ICD-10-CM

## 2018-11-04 DIAGNOSIS — I272 Pulmonary hypertension, unspecified: Secondary | ICD-10-CM

## 2018-11-04 DIAGNOSIS — D508 Other iron deficiency anemias: Secondary | ICD-10-CM | POA: Diagnosis not present

## 2018-11-04 DIAGNOSIS — E538 Deficiency of other specified B group vitamins: Secondary | ICD-10-CM

## 2018-11-04 MED ORDER — PALONOSETRON HCL INJECTION 0.25 MG/5ML
INTRAVENOUS | Status: AC
  Filled 2018-11-04: qty 5

## 2018-11-04 MED ORDER — DEXTROSE 5 % IV SOLN
Freq: Once | INTRAVENOUS | Status: AC
  Administered 2018-11-04: 11:00:00 via INTRAVENOUS
  Filled 2018-11-04: qty 250

## 2018-11-04 MED ORDER — OXALIPLATIN CHEMO INJECTION 100 MG/20ML
86.0000 mg/m2 | Freq: Once | INTRAVENOUS | Status: AC
  Administered 2018-11-04: 250 mg via INTRAVENOUS
  Filled 2018-11-04: qty 40

## 2018-11-04 MED ORDER — SODIUM CHLORIDE 0.9 % IV SOLN
2400.0000 mg/m2 | INTRAVENOUS | Status: DC
  Administered 2018-11-04: 6950 mg via INTRAVENOUS
  Filled 2018-11-04: qty 139

## 2018-11-04 MED ORDER — LEUCOVORIN CALCIUM INJECTION 350 MG
400.0000 mg/m2 | Freq: Once | INTRAVENOUS | Status: AC
  Administered 2018-11-04: 1156 mg via INTRAVENOUS
  Filled 2018-11-04: qty 57.8

## 2018-11-04 MED ORDER — DEXAMETHASONE SODIUM PHOSPHATE 10 MG/ML IJ SOLN
INTRAMUSCULAR | Status: AC
  Filled 2018-11-04: qty 1

## 2018-11-04 MED ORDER — DEXAMETHASONE SODIUM PHOSPHATE 10 MG/ML IJ SOLN
10.0000 mg | Freq: Once | INTRAMUSCULAR | Status: AC
  Administered 2018-11-04: 10 mg via INTRAVENOUS

## 2018-11-04 MED ORDER — FLUOROURACIL CHEMO INJECTION 2.5 GM/50ML
400.0000 mg/m2 | Freq: Once | INTRAVENOUS | Status: AC
  Administered 2018-11-04: 1150 mg via INTRAVENOUS
  Filled 2018-11-04: qty 23

## 2018-11-04 MED ORDER — PALONOSETRON HCL INJECTION 0.25 MG/5ML
0.2500 mg | Freq: Once | INTRAVENOUS | Status: AC
  Administered 2018-11-04: 0.25 mg via INTRAVENOUS

## 2018-11-04 NOTE — Progress Notes (Signed)
Per Dr Irene Limbo ok to start tx before he assesses pt in tx area during tx today.    Per Dr Irene Limbo ok to take 5FU pump off on Saturday 11/06/2018 at 1330.

## 2018-11-04 NOTE — Telephone Encounter (Signed)
Scheduled appt per 2/20 los.  Called patient and patient aware of appt. Will get a print out of treatment, while in treatment.

## 2018-11-04 NOTE — Patient Instructions (Signed)
[facility] Discharge Instructions for Patients Receiving Chemotherapy  Today you received the following chemotherapy agents: Oxaliplatin, Leucovorin, and 5FU.  To help prevent nausea and vomiting after your treatment, we encourage you to take your nausea medication as directed.   If you develop nausea and vomiting that is not controlled by your nausea medication, call the clinic.   BELOW ARE SYMPTOMS THAT SHOULD BE REPORTED IMMEDIATELY:  *FEVER GREATER THAN 100.5 F  *CHILLS WITH OR WITHOUT FEVER  NAUSEA AND VOMITING THAT IS NOT CONTROLLED WITH YOUR NAUSEA MEDICATION  *UNUSUAL SHORTNESS OF BREATH  *UNUSUAL BRUISING OR BLEEDING  TENDERNESS IN MOUTH AND THROAT WITH OR WITHOUT PRESENCE OF ULCERS  *URINARY PROBLEMS  *BOWEL PROBLEMS  UNUSUAL RASH Items with * indicate a potential emergency and should be followed up as soon as possible.  Feel free to call the clinic should you have any questions or concerns. The clinic phone number is [phone].  Please show the CHEMO ALERT CARD at check-in to the Emergency Department and triage nurse.    

## 2018-11-06 ENCOUNTER — Inpatient Hospital Stay: Payer: Medicaid Other

## 2018-11-06 VITALS — BP 140/90 | HR 79 | Temp 97.9°F | Resp 16

## 2018-11-06 DIAGNOSIS — C787 Secondary malignant neoplasm of liver and intrahepatic bile duct: Principal | ICD-10-CM

## 2018-11-06 DIAGNOSIS — Z5111 Encounter for antineoplastic chemotherapy: Secondary | ICD-10-CM | POA: Diagnosis not present

## 2018-11-06 DIAGNOSIS — Z7189 Other specified counseling: Secondary | ICD-10-CM

## 2018-11-06 DIAGNOSIS — C189 Malignant neoplasm of colon, unspecified: Secondary | ICD-10-CM

## 2018-11-06 MED ORDER — HEPARIN SOD (PORK) LOCK FLUSH 100 UNIT/ML IV SOLN
500.0000 [IU] | Freq: Once | INTRAVENOUS | Status: AC | PRN
  Administered 2018-11-06: 500 [IU]
  Filled 2018-11-06: qty 5

## 2018-11-06 MED ORDER — SODIUM CHLORIDE 0.9% FLUSH
10.0000 mL | INTRAVENOUS | Status: DC | PRN
  Administered 2018-11-06: 10 mL
  Filled 2018-11-06: qty 10

## 2018-11-09 ENCOUNTER — Telehealth: Payer: Self-pay | Admitting: *Deleted

## 2018-11-09 NOTE — Telephone Encounter (Signed)
Infusion nurse shared that patient reported having some severe issues with hand and leg cramping and the patient believes it may be related to her chemo and wants to touch base with Dr. Irene Limbo prior to next treatment.  Dr. Irene Limbo given information and asks that an MD visit be scheduled during having next infusion on 3/5.  Contacted patient. She stated she has the cramping worst after the chemo in the syringe that goes in slow and said it lasts 3-4 days. Informed her that Dr. Irene Limbo will be given information and will see her while she is in infusion on 3/5. Patient verbalized understanding.

## 2018-11-11 ENCOUNTER — Other Ambulatory Visit: Payer: Medicaid Other

## 2018-11-11 ENCOUNTER — Ambulatory Visit: Payer: Medicaid Other | Admitting: Hematology

## 2018-11-11 ENCOUNTER — Ambulatory Visit: Payer: Medicaid Other

## 2018-11-16 ENCOUNTER — Other Ambulatory Visit: Payer: Self-pay | Admitting: Hematology

## 2018-11-17 ENCOUNTER — Inpatient Hospital Stay: Payer: Medicaid Other

## 2018-11-17 ENCOUNTER — Inpatient Hospital Stay: Payer: Medicaid Other | Attending: Hematology

## 2018-11-17 DIAGNOSIS — D509 Iron deficiency anemia, unspecified: Secondary | ICD-10-CM

## 2018-11-17 DIAGNOSIS — Z87891 Personal history of nicotine dependence: Secondary | ICD-10-CM | POA: Diagnosis not present

## 2018-11-17 DIAGNOSIS — D709 Neutropenia, unspecified: Secondary | ICD-10-CM | POA: Diagnosis not present

## 2018-11-17 DIAGNOSIS — C180 Malignant neoplasm of cecum: Secondary | ICD-10-CM | POA: Insufficient documentation

## 2018-11-17 DIAGNOSIS — E538 Deficiency of other specified B group vitamins: Secondary | ICD-10-CM | POA: Diagnosis not present

## 2018-11-17 DIAGNOSIS — Z5111 Encounter for antineoplastic chemotherapy: Secondary | ICD-10-CM | POA: Insufficient documentation

## 2018-11-17 DIAGNOSIS — Z7189 Other specified counseling: Secondary | ICD-10-CM

## 2018-11-17 DIAGNOSIS — Z95828 Presence of other vascular implants and grafts: Secondary | ICD-10-CM

## 2018-11-17 DIAGNOSIS — I10 Essential (primary) hypertension: Secondary | ICD-10-CM | POA: Diagnosis not present

## 2018-11-17 DIAGNOSIS — C189 Malignant neoplasm of colon, unspecified: Secondary | ICD-10-CM

## 2018-11-17 DIAGNOSIS — C787 Secondary malignant neoplasm of liver and intrahepatic bile duct: Secondary | ICD-10-CM | POA: Diagnosis present

## 2018-11-17 LAB — COMPREHENSIVE METABOLIC PANEL
ALBUMIN: 2.8 g/dL — AB (ref 3.5–5.0)
ALT: 21 U/L (ref 0–44)
AST: 23 U/L (ref 15–41)
Alkaline Phosphatase: 77 U/L (ref 38–126)
Anion gap: 7 (ref 5–15)
BILIRUBIN TOTAL: 0.2 mg/dL — AB (ref 0.3–1.2)
BUN: 13 mg/dL (ref 6–20)
CALCIUM: 8.7 mg/dL — AB (ref 8.9–10.3)
CO2: 26 mmol/L (ref 22–32)
Chloride: 111 mmol/L (ref 98–111)
Creatinine, Ser: 0.87 mg/dL (ref 0.44–1.00)
GFR calc Af Amer: >60 mL/min (ref >60)
GFR calc non Af Amer: >60 mL/min (ref >60)
Glucose, Bld: 100 mg/dL — ABNORMAL HIGH (ref 70–99)
Potassium: 3.8 mmol/L (ref 3.5–5.1)
Sodium: 144 mmol/L (ref 135–145)
Total Protein: 6.8 g/dL (ref 6.5–8.1)

## 2018-11-17 LAB — CBC WITH DIFFERENTIAL/PLATELET
Abs Immature Granulocytes: 0 10*3/uL (ref 0.00–0.07)
Basophils Absolute: 0 10*3/uL (ref 0.0–0.1)
Basophils Relative: 0 %
Eosinophils Absolute: 0 10*3/uL (ref 0.0–0.5)
Eosinophils Relative: 0 %
HCT: 32.3 % — ABNORMAL LOW (ref 36.0–46.0)
Hemoglobin: 9.8 g/dL — ABNORMAL LOW (ref 12.0–15.0)
Immature Granulocytes: 0 %
Lymphocytes Relative: 61 %
Lymphs Abs: 1.4 10*3/uL (ref 0.7–4.0)
MCH: 28.1 pg (ref 26.0–34.0)
MCHC: 30.3 g/dL (ref 30.0–36.0)
MCV: 92.6 fL (ref 80.0–100.0)
Monocytes Absolute: 0.3 10*3/uL (ref 0.1–1.0)
Monocytes Relative: 13 %
NRBC: 0 % (ref 0.0–0.2)
Neutro Abs: 0.6 10*3/uL — ABNORMAL LOW (ref 1.7–7.7)
Neutrophils Relative %: 26 %
Platelets: 148 10*3/uL — ABNORMAL LOW (ref 150–400)
RBC: 3.49 MIL/uL — ABNORMAL LOW (ref 3.87–5.11)
RDW: 17.4 % — AB (ref 11.5–15.5)
WBC: 2.4 10*3/uL — ABNORMAL LOW (ref 4.0–10.5)

## 2018-11-17 MED ORDER — HEPARIN SOD (PORK) LOCK FLUSH 100 UNIT/ML IV SOLN
500.0000 [IU] | Freq: Once | INTRAVENOUS | Status: AC
  Administered 2018-11-17: 500 [IU]
  Filled 2018-11-17: qty 5

## 2018-11-17 MED ORDER — SODIUM CHLORIDE 0.9% FLUSH
10.0000 mL | Freq: Once | INTRAVENOUS | Status: AC
  Administered 2018-11-17: 10 mL
  Filled 2018-11-17: qty 10

## 2018-11-18 ENCOUNTER — Ambulatory Visit: Payer: Medicaid Other | Admitting: Hematology

## 2018-11-18 ENCOUNTER — Other Ambulatory Visit: Payer: Medicaid Other

## 2018-11-18 ENCOUNTER — Inpatient Hospital Stay: Payer: Medicaid Other | Admitting: Hematology

## 2018-11-18 ENCOUNTER — Other Ambulatory Visit: Payer: Self-pay | Admitting: *Deleted

## 2018-11-18 ENCOUNTER — Inpatient Hospital Stay: Payer: Medicaid Other

## 2018-11-18 ENCOUNTER — Telehealth: Payer: Self-pay | Admitting: *Deleted

## 2018-11-18 ENCOUNTER — Ambulatory Visit: Payer: Medicaid Other

## 2018-11-18 ENCOUNTER — Telehealth: Payer: Self-pay | Admitting: Hematology

## 2018-11-18 NOTE — Telephone Encounter (Signed)
Per Dr. Irene Limbo: Cancel infusion/pump start appt and MD appt for today 3/5, and pump stop for Saturday 3/7. Information given to Vista Lawman., RN infusion. She states she will inform patient of this information.

## 2018-11-18 NOTE — Telephone Encounter (Signed)
R/s appt per 3/5 sch message - per GK okay to schedule 3/9 due to availability in the treatment area.  - per RN Melanie okay to schedule. Pt aware of appts./

## 2018-11-19 ENCOUNTER — Encounter: Payer: Self-pay | Admitting: Hematology

## 2018-11-19 MED ORDER — DIAZEPAM 5 MG PO TABS: 5.0000 mg | ORAL_TABLET | Freq: Every day | ORAL | 0 refills | Status: DC | PRN

## 2018-11-19 NOTE — Telephone Encounter (Signed)
Advise refill.  Scheduled F/U 11-22-2018

## 2018-11-19 NOTE — Progress Notes (Signed)
HEMATOLOGY/ONCOLOGY CLINIC NOTE  Date of Service: 11/22/2018  Patient Care Team: Nolene Ebbs, MD as PCP - General (Internal Medicine) Jackelyn Knife, MD as Rounding Team (Internal Medicine)  CHIEF COMPLAINTS/PURPOSE OF CONSULTATION:  Recently diagnosed Metastatic Adenocarcinoma of colon  HISTORY OF PRESENTING ILLNESS:   Margaret [name] is a wonderful 48 y.o. female who has been referred to [place] by Dr. Nolene Ebbs for evaluation and management of Iron deficiency anemia. The pt reports that she is doing well overall.   The pt recently presented to the ED on 04/16/18 for right sided abdominal pain that was evaluated with a CT A/P which revealed liver and pancreatic cyst, recommended for outpatient MRI follow up. The pt was found to have a UTI, was treated with Rocephin and discharged with Keflex. Prior to this the pt was admitted on 04/09/18 for symptomatic anemia, presenting with HGB at 6.2, received 2 units PRBCs, one IV Iron infusion, and was encouraged to seek out GI as outpatient.  She has an appointment with [facility] on 05/19/18.   The pt reports that she is still feeling dizzy and light headed and denies feeling better after her recent blood transfusion. She notes that she dizziness presents with a feeling of warmness and that she begins hyperventilating, feeling nervous about her dizziness.  She notes that prior to her recent hospital stay, she never required a blood transfusion or IV iron replacement. She notes that as a child she had frequent nose bleeds, and was diagnosed with anemia. She also notes a history of ice picca.   She had a hysterectomy in 2012, and prior to this she had very heavy periods that began at age 8 and occurred almost constantly. She was on Depo and Mirena which did not help slow menstrual losses. Besides taking iron supplements while pregnant she has not taken PO iron replacement as she reports not tolerating it very well while pregnant.   She denies  concern for black stools or blood in the stools and has not had a colonoscopy or endoscopy before. The pt notes that she has not previously had a concern for stomach ulcers, but has acid reflux and takes Prevacid as needed for 7-8 years. She notes that she has not recently used Prevacid, for the last 3 months.  She is not taking vitamin replacements and denies any dietary restrictions.  The pt notes that she has been continuing to have lower right quadrant abdominal pain that radiates across her abdomen, presents for up to 45 minutes and occurs intermittently. She notes that her abdominal pain presented on 04/13/18.   The pt notes that was taking Ibuprofen 800mg  BID for 8 years to address her back pain related to her herniated disk. She stopped taking this 4 months ago.   Of note prior to the patient's visit today, pt has had CT A/P completed on 04/16/18 with results revealing No acute findings in the abdomen/pelvis. Least 4 low-density hepatic lesions, some of which may represent small cysts or hemangiomas, however there is a 19 mm lesion in the left lobe of the liver that is incompletely characterized. Recommend nonemergent hepatic protocol MRI for further evaluation.Marland Kitchen Possible 11 mm pancreatic nodule rising from the tail, alternatively this may represent a splenule. This can also be assessed at time of hepatic MRI. Gallstone without gallbladder inflammation. Colonic diverticulosis without diverticulitis.   Most recent lab results (04/16/18) of CBC is as follows: all values are WNL except for HGB at 9.5, HCT at 33.6,  MCH at 23.8, MCHC at 28.3, RDW at 23.2, PLT at 435k. Ferritin 04/09/18 was low at 6 Vitamin B12 on 04/09/18 was at 196  On review of systems, pt reports light headedness, dizziness, lower back pain, intermittent abdominal pain, and denies black stools, blood in the stools, tingling or numbness in her legs or arms, vaginal bleeding, and any other symptoms.  Interval History:   Margaret [name] returns today for management, evaluation, and C5 FOLFOX treatment of her newly diagnosed Metastatic Adenocarcinoma of GI primary. The patient's last visit with [place] was on 11/04/18. The pt reports that she is doing well overall.   The pt reports that she has been feeling tired and a little weak recently. She notes that her right hand has cramped while receiving the 5FU bolus, and has also felt like she "can't walk." The pt endorses recent anxiety as well, related to her diagnosis. She notes that she has been juicing fruits and vegetables recently and has continued to follow up with our nutritional therapist. She is moving her bowels well, and denies blood in the stools or black stools or abdominal pains.  Lab results today (11/22/18) of CBC w/diff is as follows: all values are WNL except for RBC at 3.48, HGB at 9.8, HCT at 32.9, MCHC at 29.8, RDW at 17.6. 11/22/18 CMP is pending  On review of systems, pt reports eating well, staying hydrated, feeling tired and weak, recent anxiety, moving her bowels well, and denies fevers, chills, concern for infections, blood in the stools, black stools, abdominal pains, leg swelling, and any other symptoms.   MEDICAL HISTORY:  Past Medical History:  Diagnosis Date  . Allergic rhinitis   . Anxiety diagnosed in 1990  . Arthritis   . Asthma   . Dyspnea    with low blood count hx of anemia  . Eczema   . History of blood transfusion   . Hypertension   . Lower back pain   . Migraine   . Sleep apnea    uses CPAP    SURGICAL HISTORY: Past Surgical History:  Procedure Laterality Date  . ABDOMINAL HYSTERECTOMY    . cortisone injections     knees bilat and back   . IR IMAGING GUIDED PORT INSERTION  09/14/2018  . iron infusion    . LIVER BIOPSY      SOCIAL HISTORY: Social History   Socioeconomic History  . Marital status: Single    Spouse name: Not on file  . Number of children: Not on file  . Years of education: Not on file  . Highest  education level: Not on file  Occupational History  . Not on file  Social Needs  . Financial resource strain: Not on file  . Food insecurity:    Worry: Not on file    Inability: Not on file  . Transportation needs:    Medical: Not on file    Non-medical: Not on file  Tobacco Use  . Smoking status: Former Smoker    Last attempt to quit: 07/30/1999    Years since quitting: 19.3  . Smokeless tobacco: Never Used  Substance and Sexual Activity  . Alcohol use: No  . Drug use: No  . Sexual activity: Not on file  Lifestyle  . Physical activity:    Days per week: Not on file    Minutes per session: Not on file  . Stress: Not on file  Relationships  . Social connections:    Talks on phone:  Not on file    Gets together: Not on file    Attends religious service: Not on file    Active member of club or organization: Not on file    Attends meetings of clubs or organizations: Not on file    Relationship status: Not on file  . Intimate partner violence:    Fear of current or ex partner: Not on file    Emotionally abused: Not on file    Physically abused: Not on file    Forced sexual activity: Not on file  Other Topics Concern  . Not on file  Social History Narrative  . Not on file    FAMILY HISTORY: No family history on file.  ALLERGIES:  is allergic to latex; tape; and tylenol with codeine #3 [acetaminophen-codeine].  MEDICATIONS:  Current Outpatient Medications  Medication Sig Dispense Refill  . albuterol (PROAIR HFA) 108 (90 Base) MCG/ACT inhaler INHALE TWO puffs BY MOUTH into the lungs EVERY 4 HOURS AS NEEDED FOR WHEEZING OR shortness of breath 18 g 1  . amoxicillin-clavulanate (AUGMENTIN) 875-125 MG tablet Take 1 tablet by mouth every 12 (twelve) hours. 10 tablet 0  . Boric Acid 4 % SOLN Place 1 capsule vaginally every Friday.     . cetirizine (ZYRTEC) 10 MG tablet Take 1 tablet (10 mg total) by mouth daily. 30 tablet 6  . clobetasol ointment (TEMOVATE) 2.35 % Apply 1  application topically 2 (two) times daily. Use for Lichen Sclerosis    . Cyanocobalamin (B-12) 1000 MCG SUBL Place 1,000 mcg under the tongue daily. 30 each 3  . cyclobenzaprine (FLEXERIL) 10 MG tablet Take 10 mg by mouth 3 (three) times daily as needed (muscle spasms.).     Marland Kitchen dexamethasone (DECADRON) 4 MG tablet Take 2 tablets (8 mg total) by mouth daily. Start the day after chemotherapy for 2 days. Take with food. 30 tablet 1  . diazepam (VALIUM) 5 MG tablet Take 1 tablet (5 mg total) by mouth daily as needed for anxiety (panic attacks). 30 tablet 0  . diclofenac sodium (VOLTAREN) 1 % GEL Apply 2 g topically 4 (four) times daily as needed (for back/knee pain.).     Marland Kitchen dicyclomine (BENTYL) 20 MG tablet Take 1 tablet (20 mg total) by mouth 2 (two) times daily. (Patient taking differently: Take 20 mg by mouth 3 (three) times daily before meals. ) 20 tablet 0  . ergocalciferol (VITAMIN D2) 50000 UNITS capsule Take 50,000 Units by mouth every Monday.     . lidocaine-prilocaine (EMLA) cream Apply to affected area once 30 g 3  . lisinopril-hydrochlorothiazide (PRINZIDE,ZESTORETIC) 20-12.5 MG tablet Take 1 tablet by mouth daily.    . methocarbamol (ROBAXIN) 750 MG tablet Take 750 mg by mouth every 8 (eight) hours as needed (back spasms.).     Marland Kitchen montelukast (SINGULAIR) 10 MG tablet Take 1 tablet (10 mg total) by mouth at bedtime. 30 tablet 6  . nystatin (NYSTATIN) powder Apply 1 g topically 2 (two) times daily as needed (irritation). Use in skin folds and under breast     . nystatin-triamcinolone (MYCOLOG II) cream Apply 1 application topically 2 (two) times daily as needed (irritation). Use in skin folds and under breast     . ondansetron (ZOFRAN) 8 MG tablet Take 1 tablet (8 mg total) by mouth 2 (two) times daily as needed for refractory nausea / vomiting. Start on day 3 after chemotherapy. 30 tablet 0  . pantoprazole (PROTONIX) 40 MG tablet Take 40 mg by mouth  daily before breakfast.   1  . PATADAY 0.2 %  SOLN Apply 2 drops to eye 2 (two) times daily as needed (for irritated eyes.). 2.5 mL 5  . pimecrolimus (ELIDEL) 1 % cream Apply 1 application topically 2 (two) times daily. 30 g 6  . prochlorperazine (COMPAZINE) 10 MG tablet Take 1 tablet (10 mg total) by mouth every 6 (six) hours as needed (Nausea or vomiting). 30 tablet 1  . saccharomyces boulardii (FLORASTOR) 250 MG capsule Take 1 capsule (250 mg total) by mouth 2 (two) times daily.    . SYMBICORT 160-4.5 MCG/ACT inhaler Inhale 2 puffs into the lungs 2 (two) times daily. (Patient taking differently: Inhale 2 puffs into the lungs 2 (two) times daily. ) 10.2 g 3  . tiZANidine (ZANAFLEX) 4 MG tablet Take 4 mg by mouth every 8 (eight) hours as needed for spasms.    . traMADol (ULTRAM) 50 MG tablet Take 50 mg by mouth every 6 (six) hours as needed for moderate pain (back pain.).     Marland Kitchen triamcinolone (NASACORT) 55 MCG/ACT AERO nasal inhaler Place 2 sprays into the nose daily as needed (for allergies). 1 Inhaler 3  . valACYclovir (VALTREX) 500 MG tablet Take 500 mg by mouth 2 (two) times daily as needed (for cold sores).     . vitamin B-12 1000 MCG tablet Take 1 tablet (1,000 mcg total) by mouth daily.     No current facility-administered medications for this visit.     REVIEW OF SYSTEMS:    A 10+ POINT REVIEW OF SYSTEMS WAS OBTAINED including neurology, dermatology, psychiatry, cardiac, respiratory, lymph, extremities, GI, GU, Musculoskeletal, constitutional, breasts, reproductive, HEENT.  All pertinent positives are noted in the HPI.  All others are negative.  PHYSICAL EXAMINATION:   GENERAL:alert, in no acute distress and comfortable SKIN: no acute rashes, no significant lesions EYES: conjunctiva are pink and non-injected, sclera anicteric OROPHARYNX: MMM, no exudates, no oropharyngeal erythema or ulceration NECK: supple, no JVD LYMPH:  no palpable lymphadenopathy in the cervical, axillary or inguinal regions LUNGS: clear to auscultation b/l  with normal respiratory effort HEART: regular rate & rhythm ABDOMEN:  normoactive bowel sounds , non tender, not distended. No palpable hepatosplenomegaly.  Extremity: no pedal edema PSYCH: alert & oriented x 3 with fluent speech NEURO: no focal motor/sensory deficits   LABORATORY DATA:  I have reviewed the data as listed  . CBC Latest Ref Rng & Units 11/22/2018 11/17/2018 11/03/2018  WBC 4.0 - 10.5 K/uL 4.7 2.4(L) 3.2(L)  Hemoglobin 12.0 - 15.0 g/dL 9.8(L) 9.8(L) 9.5(L)  Hematocrit 36.0 - 46.0 % 32.9(L) 32.3(L) 31.1(L)  Platelets 150 - 400 K/uL 189 148(L) 162   ANC 900 . CMP Latest Ref Rng & Units 11/17/2018 11/03/2018 10/21/2018  Glucose 70 - 99 mg/dL 100(H) 90 74  BUN 6 - 20 mg/dL 13 12 10   Creatinine 0.44 - 1.00 mg/dL 0.87 0.80 0.91  Sodium 135 - 145 mmol/L 144 142 145  Potassium 3.5 - 5.1 mmol/L 3.8 4.1 4.1  Chloride 98 - 111 mmol/L 111 109 110  CO2 22 - 32 mmol/L 26 25 26   Calcium 8.9 - 10.3 mg/dL 8.7(L) 8.8(L) 9.1  Total Protein 6.5 - 8.1 g/dL 6.8 6.9 7.2  Total Bilirubin 0.3 - 1.2 mg/dL 0.2(L) 0.4 0.5  Alkaline Phos 38 - 126 U/L 77 83 88  AST 15 - 41 U/L 23 19 15   ALT 0 - 44 U/L 21 11 10    . Lab Results  Component Value Date  IRON 24 (L) 09/06/2018   TIBC 156 (L) 09/06/2018   IRONPCTSAT 15 (L) 09/06/2018   (Iron and TIBC)  Lab Results  Component Value Date   FERRITIN 540 (H) 11/03/2018   B12  - 196--> 584  07/22/18 Liver Biopsy:   08/27/18 Repeat Liver Biopsy:    RADIOGRAPHIC STUDIES: I have personally reviewed the radiological images as listed and agreed with the findings in the report. No results found.  ASSESSMENT & PLAN:   48 y.o. female with  1. Iron Deficiency Anemia - ? Previous heavy periods vs GI losses  Recent heavy NSAID use ? Ulcer  2. B12 deficiency Labs upon initial presentation from 04/16/18, HGB at 9.5. The 04/09/18 Ferritin was low at 6 and Vitamin B12 was at 196 -No antiparietal antibodies, no intrinsic factor antibodies, normal TSH  all from 04/28/18  PLAN: -Continue NSAID avoidance  -continue 1014mcg Vitamin B12 sublingually daily - B12 improved from 196 to 584  3.MetastaticAdenocarcinoma of GI primary  04/16/18 CT A/P with pt revealed No acute findings in the abdomen/pelvis. Least 4 low-density hepatic lesions, some of which may represent small cysts or hemangiomas, however there is a 19 mm lesion in the left lobe of the liver that is incompletely characterized. Recommend nonemergent hepatic protocol MRI for further evaluation.Marland Kitchen Possible 11 mm pancreatic nodule rising from the tail, alternatively this may represent a splenule. This can also be assessed at time of hepatic MRI. Gallstone without gallbladder inflammation. Colonic diverticulosis without diverticulitis.   07/09/18 Tumor marker work up: CEA normal at 1.32, AFP normal at 1.5, LDH normal at 138, CA 125 normal at 5.9, CA 19-9 normal at <2   07/22/18 Liver biopsy results revealed benign hepatic tissue, and was not diagnostic   07/16/18 PET/CT revealedThere is a solid hypermetabolic mass involving the cecum, worrisome for colonic primary neoplasm. Correlation withcolonoscopy Findings. 2. Multiple hypermetabolic liver lesions compatible with metastaticdisease. 3. Large low-density lesion in right lobe of thyroid gland. Advise further evaluation with thyroid sonography. 4. Aortic Atherosclerosis. 5. Small hiatal hernia.   08/27/18 Liver biopsy revealed Metastatic adenocarcinoma, consistent with gastrointestinal primary   09/03/18 ECHO revealed an LV EF of 97-67%, mild diastolic dysfunction; mild RVE; dilated PA; mild TR; moderate to severe pulmonary hypertension  09/17/18 CEA at 2.39, which was the last CEA pre-treatment value   4. Neutropenia  PLAN: -Discussed pt labwork today, 11/22/18; WBC and PLT have normalized, other blood counts are stable -Recommended continued nutrition optimization, and the pt does continue to follow up with our nutritional  therapist -The pt has no prohibitive toxicities from continuing C5 FOLFX at this time. -If all continues to remain stable, will consider adding Avastin after seeing CT imaging following C6 -Discussed that there is not urgency from my perspective for the pt to have a colonoscopy at this time -Will plant for repeat CT after completing C6 to evaluate treatment efficacy and for further treatment planning -Discussed again that pt should avoid cold objects and fluids during treatment with FOLFOX, which she has done.  -Given neurotoxicity from Oxaloplatin - grade 1-2 --- will give oxaloplatin over 4 hours instead of 2hours -Will evaluate further treatment options as the pending genetic testing results  -Recommend saline sprays OTC to prevent further epistaxis  -Recommend soft foods that are easy to digest  -Recommend salt and baking soda mouthwashes -Recommended that the pt continue to eat well, drink at least 48-64 oz of water each day, and walk 20-30 minutes each day. -Will add G-CSF support  as pt had a one week treatment delay due to neutropenia last week -Will set the pt up for outpatient PT  -Will see the pt back in 4 weeks   Added Udenycha to D3 Please schedule next 2 cycles of treatment as ordered CT abd/pelvis RTC with Dr Irene Limbo in 4 weeks Port flush appointment with each lab   All of the patients questions were answered with apparent satisfaction. The patient knows to call the clinic with any problems, questions or concerns.  The total time spent in the appt was 25 minutes and more than 50% was on counseling and direct patient cares.    Sullivan Lone MD MS AAHIVMS [facility] Hematology/Oncology Physician [facility]  (Office):       [phone] (Work cell):  [phone] (Fax):           [phone]  11/22/2018 11:23 AM  I, Baldwin Jamaica, am acting as a scribe for Dr. Sullivan Lone.   .I have reviewed the above documentation for accuracy and completeness, and I agree  with the above. Brunetta Genera MD

## 2018-11-22 ENCOUNTER — Inpatient Hospital Stay: Payer: Medicaid Other

## 2018-11-22 ENCOUNTER — Ambulatory Visit: Payer: Medicaid Other | Admitting: Nutrition

## 2018-11-22 ENCOUNTER — Inpatient Hospital Stay (HOSPITAL_BASED_OUTPATIENT_CLINIC_OR_DEPARTMENT_OTHER): Payer: Medicaid Other | Admitting: Hematology

## 2018-11-22 ENCOUNTER — Telehealth: Payer: Self-pay | Admitting: Hematology

## 2018-11-22 VITALS — BP 190/97 | HR 95 | Temp 98.0°F | Resp 18 | Ht 70.0 in | Wt 382.1 lb

## 2018-11-22 DIAGNOSIS — Z5111 Encounter for antineoplastic chemotherapy: Secondary | ICD-10-CM | POA: Diagnosis not present

## 2018-11-22 DIAGNOSIS — C787 Secondary malignant neoplasm of liver and intrahepatic bile duct: Secondary | ICD-10-CM | POA: Diagnosis not present

## 2018-11-22 DIAGNOSIS — C180 Malignant neoplasm of cecum: Secondary | ICD-10-CM

## 2018-11-22 DIAGNOSIS — C189 Malignant neoplasm of colon, unspecified: Secondary | ICD-10-CM

## 2018-11-22 DIAGNOSIS — D509 Iron deficiency anemia, unspecified: Secondary | ICD-10-CM

## 2018-11-22 DIAGNOSIS — D709 Neutropenia, unspecified: Secondary | ICD-10-CM

## 2018-11-22 DIAGNOSIS — E538 Deficiency of other specified B group vitamins: Secondary | ICD-10-CM

## 2018-11-22 DIAGNOSIS — Z7189 Other specified counseling: Secondary | ICD-10-CM

## 2018-11-22 DIAGNOSIS — Z87891 Personal history of nicotine dependence: Secondary | ICD-10-CM | POA: Diagnosis not present

## 2018-11-22 DIAGNOSIS — Z95828 Presence of other vascular implants and grafts: Secondary | ICD-10-CM

## 2018-11-22 LAB — COMPREHENSIVE METABOLIC PANEL
ALT: 23 U/L (ref 0–44)
AST: 24 U/L (ref 15–41)
Albumin: 2.7 g/dL — ABNORMAL LOW (ref 3.5–5.0)
Alkaline Phosphatase: 79 U/L (ref 38–126)
Anion gap: 5 (ref 5–15)
BUN: 13 mg/dL (ref 6–20)
CO2: 26 mmol/L (ref 22–32)
Calcium: 8.8 mg/dL — ABNORMAL LOW (ref 8.9–10.3)
Chloride: 111 mmol/L (ref 98–111)
Creatinine, Ser: 0.88 mg/dL (ref 0.44–1.00)
GFR calc Af Amer: >60 mL/min (ref >60)
GFR calc non Af Amer: >60 mL/min (ref >60)
Glucose, Bld: 101 mg/dL — ABNORMAL HIGH (ref 70–99)
Potassium: 4.1 mmol/L (ref 3.5–5.1)
Sodium: 142 mmol/L (ref 135–145)
Total Bilirubin: 0.2 mg/dL — ABNORMAL LOW (ref 0.3–1.2)
Total Protein: 6.6 g/dL (ref 6.5–8.1)

## 2018-11-22 LAB — CBC WITH DIFFERENTIAL/PLATELET
Abs Immature Granulocytes: 0.19 10*3/uL — ABNORMAL HIGH (ref 0.00–0.07)
Basophils Absolute: 0 10*3/uL (ref 0.0–0.1)
Basophils Relative: 0 %
Eosinophils Absolute: 0 10*3/uL (ref 0.0–0.5)
Eosinophils Relative: 0 %
HCT: 32.9 % — ABNORMAL LOW (ref 36.0–46.0)
Hemoglobin: 9.8 g/dL — ABNORMAL LOW (ref 12.0–15.0)
Immature Granulocytes: 4 %
Lymphocytes Relative: 44 %
Lymphs Abs: 2 10*3/uL (ref 0.7–4.0)
MCH: 28.2 pg (ref 26.0–34.0)
MCHC: 29.8 g/dL — ABNORMAL LOW (ref 30.0–36.0)
MCV: 94.5 fL (ref 80.0–100.0)
MONOS PCT: 14 %
Monocytes Absolute: 0.7 10*3/uL (ref 0.1–1.0)
Neutro Abs: 1.8 10*3/uL (ref 1.7–7.7)
Neutrophils Relative %: 38 %
PLATELETS: 189 10*3/uL (ref 150–400)
RBC: 3.48 MIL/uL — ABNORMAL LOW (ref 3.87–5.11)
RDW: 17.6 % — ABNORMAL HIGH (ref 11.5–15.5)
WBC: 4.7 10*3/uL (ref 4.0–10.5)
nRBC: 0 % (ref 0.0–0.2)

## 2018-11-22 MED ORDER — DEXAMETHASONE SODIUM PHOSPHATE 10 MG/ML IJ SOLN
INTRAMUSCULAR | Status: AC
  Filled 2018-11-22: qty 1

## 2018-11-22 MED ORDER — LEUCOVORIN CALCIUM INJECTION 350 MG
400.0000 mg/m2 | Freq: Once | INTRAVENOUS | Status: AC
  Administered 2018-11-22: 1156 mg via INTRAVENOUS
  Filled 2018-11-22: qty 57.8

## 2018-11-22 MED ORDER — SODIUM CHLORIDE 0.9% FLUSH
10.0000 mL | INTRAVENOUS | Status: DC | PRN
  Filled 2018-11-22: qty 10

## 2018-11-22 MED ORDER — PALONOSETRON HCL INJECTION 0.25 MG/5ML
INTRAVENOUS | Status: AC
  Filled 2018-11-22: qty 5

## 2018-11-22 MED ORDER — OXALIPLATIN CHEMO INJECTION 100 MG/20ML: 86.0000 mg/m2 | Freq: Once | INTRAVENOUS | Status: DC

## 2018-11-22 MED ORDER — PALONOSETRON HCL INJECTION 0.25 MG/5ML
0.2500 mg | Freq: Once | INTRAVENOUS | Status: AC
  Administered 2018-11-22: 0.25 mg via INTRAVENOUS

## 2018-11-22 MED ORDER — DEXTROSE 5 % IV SOLN
Freq: Once | INTRAVENOUS | Status: AC
  Administered 2018-11-22: 12:00:00 via INTRAVENOUS
  Filled 2018-11-22: qty 250

## 2018-11-22 MED ORDER — OXALIPLATIN CHEMO INJECTION 100 MG/20ML
86.0000 mg/m2 | Freq: Once | INTRAVENOUS | Status: AC
  Administered 2018-11-22: 250 mg via INTRAVENOUS
  Filled 2018-11-22: qty 40

## 2018-11-22 MED ORDER — DEXTROSE 5 % IV SOLN
Freq: Once | INTRAVENOUS | Status: AC
  Administered 2018-11-22: 13:00:00 via INTRAVENOUS
  Filled 2018-11-22: qty 250

## 2018-11-22 MED ORDER — DEXAMETHASONE SODIUM PHOSPHATE 10 MG/ML IJ SOLN
10.0000 mg | Freq: Once | INTRAMUSCULAR | Status: AC
  Administered 2018-11-22: 10 mg via INTRAVENOUS

## 2018-11-22 MED ORDER — SODIUM CHLORIDE 0.9 % IV SOLN
2400.0000 mg/m2 | INTRAVENOUS | Status: DC
  Administered 2018-11-22: 6950 mg via INTRAVENOUS
  Filled 2018-11-22: qty 139

## 2018-11-22 MED ORDER — HEPARIN SOD (PORK) LOCK FLUSH 100 UNIT/ML IV SOLN
500.0000 [IU] | Freq: Once | INTRAVENOUS | Status: DC | PRN
  Filled 2018-11-22: qty 5

## 2018-11-22 MED ORDER — SODIUM CHLORIDE 0.9% FLUSH
10.0000 mL | Freq: Once | INTRAVENOUS | Status: AC
  Administered 2018-11-22: 10 mL
  Filled 2018-11-22: qty 10

## 2018-11-22 NOTE — Patient Instructions (Signed)
[facility] Discharge Instructions for Patients Receiving Chemotherapy  Today you received the following chemotherapy agents: Oxaliplatin, Leucovorin, and 5FU.  To help prevent nausea and vomiting after your treatment, we encourage you to take your nausea medication as directed.   If you develop nausea and vomiting that is not controlled by your nausea medication, call the clinic.   BELOW ARE SYMPTOMS THAT SHOULD BE REPORTED IMMEDIATELY:  *FEVER GREATER THAN 100.5 F  *CHILLS WITH OR WITHOUT FEVER  NAUSEA AND VOMITING THAT IS NOT CONTROLLED WITH YOUR NAUSEA MEDICATION  *UNUSUAL SHORTNESS OF BREATH  *UNUSUAL BRUISING OR BLEEDING  TENDERNESS IN MOUTH AND THROAT WITH OR WITHOUT PRESENCE OF ULCERS  *URINARY PROBLEMS  *BOWEL PROBLEMS  UNUSUAL RASH Items with * indicate a potential emergency and should be followed up as soon as possible.  Feel free to call the clinic should you have any questions or concerns. The clinic phone number is [phone].  Please show the CHEMO ALERT CARD at check-in to the Emergency Department and triage nurse.    

## 2018-11-22 NOTE — Progress Notes (Signed)
Patient requested to speak to me today during her infusion for metastatic cancer of GI adenocarcinoma of colon.  Patient is wondering what kinds of smoothies she can purchase or prepare using a juicer.  Explained patient should add protein of some kind to the fruits and vegetables she is juicing.  Encouraged high-protein foods and weight maintenance or slow safe weight loss.  Provided patient with my contact information.  Patient is grateful for an answer to her question.

## 2018-11-22 NOTE — Telephone Encounter (Signed)
Scheduled appt per 3/9 los.  Gave patient contrast, instructions and the number to central radiology.  Printed calendar and avs.

## 2018-11-24 ENCOUNTER — Other Ambulatory Visit: Payer: Self-pay

## 2018-11-24 ENCOUNTER — Inpatient Hospital Stay: Payer: Medicaid Other

## 2018-11-24 VITALS — BP 150/91 | HR 70 | Temp 97.9°F | Resp 18

## 2018-11-24 DIAGNOSIS — Z5111 Encounter for antineoplastic chemotherapy: Secondary | ICD-10-CM | POA: Diagnosis not present

## 2018-11-24 DIAGNOSIS — C189 Malignant neoplasm of colon, unspecified: Secondary | ICD-10-CM

## 2018-11-24 DIAGNOSIS — C787 Secondary malignant neoplasm of liver and intrahepatic bile duct: Principal | ICD-10-CM

## 2018-11-24 DIAGNOSIS — Z7189 Other specified counseling: Secondary | ICD-10-CM

## 2018-11-24 MED ORDER — HEPARIN SOD (PORK) LOCK FLUSH 100 UNIT/ML IV SOLN
500.0000 [IU] | Freq: Once | INTRAVENOUS | Status: AC | PRN
  Administered 2018-11-24: 500 [IU]
  Filled 2018-11-24: qty 5

## 2018-11-24 MED ORDER — PEGFILGRASTIM INJECTION 6 MG/0.6ML ~~LOC~~
6.0000 mg | PREFILLED_SYRINGE | Freq: Once | SUBCUTANEOUS | Status: AC
  Administered 2018-11-24: 6 mg via SUBCUTANEOUS

## 2018-11-24 MED ORDER — PEGFILGRASTIM-CBQV 6 MG/0.6ML ~~LOC~~ SOSY: 6.0000 mg | PREFILLED_SYRINGE | Freq: Once | SUBCUTANEOUS | Status: DC

## 2018-11-24 MED ORDER — SODIUM CHLORIDE 0.9% FLUSH
10.0000 mL | INTRAVENOUS | Status: DC | PRN
  Administered 2018-11-24: 10 mL
  Filled 2018-11-24: qty 10

## 2018-11-24 MED ORDER — PEGFILGRASTIM INJECTION 6 MG/0.6ML ~~LOC~~
PREFILLED_SYRINGE | SUBCUTANEOUS | Status: AC
  Filled 2018-11-24: qty 0.6

## 2018-11-24 NOTE — Patient Instructions (Signed)
Pegfilgrastim injection  What is this medicine?  PEGFILGRASTIM (PEG fil gra stim) is a long-acting granulocyte colony-stimulating factor that stimulates the growth of neutrophils, a type of white blood cell important in the body's fight against infection. It is used to reduce the incidence of fever and infection in patients with certain types of cancer who are receiving chemotherapy that affects the bone marrow, and to increase survival after being exposed to high doses of radiation.  This medicine may be used for other purposes; ask your health care provider or pharmacist if you have questions.  COMMON BRAND NAME(S): Fulphila, Neulasta, UDENYCA  What should I tell my health care provider before I take this medicine?  They need to know if you have any of these conditions:  -kidney disease  -latex allergy  -ongoing radiation therapy  -sickle cell disease  -skin reactions to acrylic adhesives (On-Body Injector only)  -an unusual or allergic reaction to pegfilgrastim, filgrastim, other medicines, foods, dyes, or preservatives  -pregnant or trying to get pregnant  -breast-feeding  How should I use this medicine?  This medicine is for injection under the skin. If you get this medicine at home, you will be taught how to prepare and give the pre-filled syringe or how to use the On-body Injector. Refer to the patient Instructions for Use for detailed instructions. Use exactly as directed. Tell your healthcare provider immediately if you suspect that the On-body Injector may not have performed as intended or if you suspect the use of the On-body Injector resulted in a missed or partial dose.  It is important that you put your used needles and syringes in a special sharps container. Do not put them in a trash can. If you do not have a sharps container, call your pharmacist or healthcare provider to get one.  Talk to your pediatrician regarding the use of this medicine in children. While this drug may be prescribed for  selected conditions, precautions do apply.  Overdosage: If you think you have taken too much of this medicine contact a poison control center or emergency room at once.  NOTE: This medicine is only for you. Do not share this medicine with others.  What if I miss a dose?  It is important not to miss your dose. Call your doctor or health care professional if you miss your dose. If you miss a dose due to an On-body Injector failure or leakage, a new dose should be administered as soon as possible using a single prefilled syringe for manual use.  What may interact with this medicine?  Interactions have not been studied.  Give your health care provider a list of all the medicines, herbs, non-prescription drugs, or dietary supplements you use. Also tell them if you smoke, drink alcohol, or use illegal drugs. Some items may interact with your medicine.  This list may not describe all possible interactions. Give your health care provider a list of all the medicines, herbs, non-prescription drugs, or dietary supplements you use. Also tell them if you smoke, drink alcohol, or use illegal drugs. Some items may interact with your medicine.  What should I watch for while using this medicine?  You may need blood work done while you are taking this medicine.  If you are going to need a MRI, CT scan, or other procedure, tell your doctor that you are using this medicine (On-Body Injector only).  What side effects may I notice from receiving this medicine?  Side effects that you should report to   your doctor or health care professional as soon as possible:  -allergic reactions like skin rash, itching or hives, swelling of the face, lips, or tongue  -back pain  -dizziness  -fever  -pain, redness, or irritation at site where injected  -pinpoint red spots on the skin  -red or dark-brown urine  -shortness of breath or breathing problems  -stomach or side pain, or pain at the shoulder  -swelling  -tiredness  -trouble passing urine or  change in the amount of urine  Side effects that usually do not require medical attention (report to your doctor or health care professional if they continue or are bothersome):  -bone pain  -muscle pain  This list may not describe all possible side effects. Call your doctor for medical advice about side effects. You may report side effects to FDA at 1-800-FDA-1088.  Where should I keep my medicine?  Keep out of the reach of children.  If you are using this medicine at home, you will be instructed on how to store it. Throw away any unused medicine after the expiration date on the label.  NOTE: This sheet is a summary. It may not cover all possible information. If you have questions about this medicine, talk to your doctor, pharmacist, or health care provider.   2019 [employer] (2017-12-07 16:57:08)

## 2018-11-25 ENCOUNTER — Other Ambulatory Visit: Payer: Medicaid Other

## 2018-11-25 ENCOUNTER — Telehealth: Payer: Self-pay | Admitting: *Deleted

## 2018-11-25 ENCOUNTER — Ambulatory Visit: Payer: Medicaid Other

## 2018-11-25 ENCOUNTER — Ambulatory Visit: Payer: Medicaid Other | Admitting: Hematology

## 2018-11-25 NOTE — Telephone Encounter (Signed)
Patient called. Experiencing nausea/vomiting and loss of appetite. Compazine helps some, Zofran no help at all. Her Case Manager suggested a pill form or marijuana. Information given to Dr. Irene Limbo. Dr. Irene Limbo will prescribe Marinol for patient. Contacted patient to inform her. She verbalized understanding.

## 2018-11-26 ENCOUNTER — Encounter: Payer: Self-pay | Admitting: General Practice

## 2018-11-26 NOTE — Progress Notes (Signed)
[place] CSW Progress Notes  Patient informed that Support Group on 3/16 has been cancelled, [facility] transport cancelled as well.  Provided her email to [place] to be added to GI Support Group list.  Edwyna Shell, LCSW Clinical Social Worker Phone:  [phone]

## 2018-11-29 ENCOUNTER — Ambulatory Visit: Payer: Medicaid Other | Admitting: Rehabilitation

## 2018-12-01 ENCOUNTER — Other Ambulatory Visit: Payer: Medicaid Other

## 2018-12-01 ENCOUNTER — Ambulatory Visit: Payer: Medicaid Other | Admitting: Hematology

## 2018-12-02 ENCOUNTER — Ambulatory Visit: Payer: Medicaid Other

## 2018-12-02 ENCOUNTER — Other Ambulatory Visit: Payer: Self-pay | Admitting: Hematology

## 2018-12-02 MED ORDER — DRONABINOL 5 MG PO CAPS: 5.0000 mg | ORAL_CAPSULE | Freq: Two times a day (BID) | ORAL | 0 refills | Status: DC

## 2018-12-03 ENCOUNTER — Telehealth: Payer: Self-pay | Admitting: *Deleted

## 2018-12-03 NOTE — Telephone Encounter (Signed)
Patient called to state she had congested sinuses w/clear drainage and sore throat. Denies fever. Asking if she should still have chemo infusion on 3/24. Per Dr. Irene Limbo, direct patient to contact PCP if symptoms worsen (temperature increase/SOB/change in color of drainage). Will contact Monday to confirm no change in symptoms prior to appt on 3/24.

## 2018-12-07 ENCOUNTER — Other Ambulatory Visit: Payer: Medicaid Other

## 2018-12-07 ENCOUNTER — Ambulatory Visit: Payer: Medicaid Other

## 2018-12-09 ENCOUNTER — Ambulatory Visit: Payer: Medicaid Other

## 2018-12-13 ENCOUNTER — Telehealth: Payer: Self-pay | Admitting: *Deleted

## 2018-12-13 ENCOUNTER — Encounter (HOSPITAL_COMMUNITY): Payer: Self-pay

## 2018-12-13 ENCOUNTER — Ambulatory Visit (HOSPITAL_COMMUNITY)
Admission: RE | Admit: 2018-12-13 | Discharge: 2018-12-13 | Disposition: A | Discharge status: Home / Self Care | Payer: Medicaid Other | Source: Ambulatory Visit | Attending: Hematology | Admitting: Hematology

## 2018-12-13 ENCOUNTER — Other Ambulatory Visit: Payer: Self-pay

## 2018-12-13 DIAGNOSIS — C787 Secondary malignant neoplasm of liver and intrahepatic bile duct: Secondary | ICD-10-CM | POA: Diagnosis present

## 2018-12-13 DIAGNOSIS — C189 Malignant neoplasm of colon, unspecified: Secondary | ICD-10-CM | POA: Insufficient documentation

## 2018-12-13 DIAGNOSIS — Z5111 Encounter for antineoplastic chemotherapy: Secondary | ICD-10-CM | POA: Insufficient documentation

## 2018-12-13 MED ORDER — SODIUM CHLORIDE (PF) 0.9 % IJ SOLN
INTRAMUSCULAR | Status: AC
  Filled 2018-12-13: qty 50

## 2018-12-13 MED ORDER — IOHEXOL 300 MG/ML  SOLN
150.0000 mL | Freq: Once | INTRAMUSCULAR | Status: AC | PRN
  Administered 2018-12-13: 150 mL via INTRAVENOUS

## 2018-12-13 MED ORDER — IOHEXOL 300 MG/ML  SOLN
30.0000 mL | Freq: Once | INTRAMUSCULAR | Status: AC | PRN
  Administered 2018-12-13: 30 mL via ORAL
  Filled 2018-12-13: qty 30

## 2018-12-13 NOTE — Telephone Encounter (Signed)
Dr. Irene Limbo signed "Request for Independent Assessment of [place] of Medical Need" to [facility]. It was faxed to [phone]. Fax confirmation received.

## 2018-12-17 NOTE — Progress Notes (Signed)
HEMATOLOGY/ONCOLOGY CLINIC NOTE  Date of Service: 12/20/2018  Patient Care Team: Nolene Ebbs, MD as PCP - General (Internal Medicine) Jackelyn Knife, MD as Rounding Team (Internal Medicine)  CHIEF COMPLAINTS/PURPOSE OF CONSULTATION:  Recently diagnosed Metastatic Adenocarcinoma of colon  HISTORY OF PRESENTING ILLNESS:   Margaret [name] is a wonderful 48 y.o. female who has been referred to [place] by Dr. Nolene Ebbs for evaluation and management of Iron deficiency anemia. The pt reports that she is doing well overall.   The pt recently presented to the ED on 04/16/18 for right sided abdominal pain that was evaluated with a CT A/P which revealed liver and pancreatic cyst, recommended for outpatient MRI follow up. The pt was found to have a UTI, was treated with Rocephin and discharged with Keflex. Prior to this the pt was admitted on 04/09/18 for symptomatic anemia, presenting with HGB at 6.2, received 2 units PRBCs, one IV Iron infusion, and was encouraged to seek out GI as outpatient.  She has an appointment with [facility] on 05/19/18.   The pt reports that she is still feeling dizzy and light headed and denies feeling better after her recent blood transfusion. She notes that she dizziness presents with a feeling of warmness and that she begins hyperventilating, feeling nervous about her dizziness.  She notes that prior to her recent hospital stay, she never required a blood transfusion or IV iron replacement. She notes that as a child she had frequent nose bleeds, and was diagnosed with anemia. She also notes a history of ice picca.   She had a hysterectomy in 2012, and prior to this she had very heavy periods that began at age 11 and occurred almost constantly. She was on Depo and Mirena which did not help slow menstrual losses. Besides taking iron supplements while pregnant she has not taken PO iron replacement as she reports not tolerating it very well while pregnant.   She denies  concern for black stools or blood in the stools and has not had a colonoscopy or endoscopy before. The pt notes that she has not previously had a concern for stomach ulcers, but has acid reflux and takes Prevacid as needed for 7-8 years. She notes that she has not recently used Prevacid, for the last 3 months.  She is not taking vitamin replacements and denies any dietary restrictions.  The pt notes that she has been continuing to have lower right quadrant abdominal pain that radiates across her abdomen, presents for up to 45 minutes and occurs intermittently. She notes that her abdominal pain presented on 04/13/18.   The pt notes that was taking Ibuprofen '800mg'$  BID for 8 years to address her back pain related to her herniated disk. She stopped taking this 4 months ago.   Of note prior to the patient's visit today, pt has had CT A/P completed on 04/16/18 with results revealing No acute findings in the abdomen/pelvis. Least 4 low-density hepatic lesions, some of which may represent small cysts or hemangiomas, however there is a 19 mm lesion in the left lobe of the liver that is incompletely characterized. Recommend nonemergent hepatic protocol MRI for further evaluation.Marland Kitchen Possible 11 mm pancreatic nodule rising from the tail, alternatively this may represent a splenule. This can also be assessed at time of hepatic MRI. Gallstone without gallbladder inflammation. Colonic diverticulosis without diverticulitis.   Most recent lab results (04/16/18) of CBC is as follows: all values are WNL except for HGB at 9.5, HCT at 33.6,  MCH at 23.8, MCHC at 28.3, RDW at 23.2, PLT at 435k. Ferritin 04/09/18 was low at 6 Vitamin B12 on 04/09/18 was at 196  On review of systems, pt reports light headedness, dizziness, lower back pain, intermittent abdominal pain, and denies black stools, blood in the stools, tingling or numbness in her legs or arms, vaginal bleeding, and any other symptoms.  Interval History:   Margaret [name] returns today for management, evaluation, and C6 FOLFOX treatment of her newly diagnosed Metastatic Adenocarcinoma of GI primary. The patient's last visit with [place] was on 11/22/18. The pt reports that she is doing well overall.   The pt notes that she has been having worsened joint pains and was not able to receive her cortisone shot. The pt reports that she has been using some topical gel which has not helped. She also notes that she had significant bone pains, which lasted for 4 days, after her recent Neulasta injection.   The pt notes that she has been eating much better and has gained weight. She notes that Marinol has been helpful for her. She has been juicing vegetables. She adds that she is getting up and about frequently. She denies abdominal pains, blood in the stools, or black stools. She notes that she is moving her bowels well.  The pt notes that she has not had any concerns with tingling or numbness in her hands or feet.  Of note since the patient's last visit, pt has had a CT A/P completed on 12/13/18 with results revealing Interval response to therapy. There is been decrease in size of multifocal liver metastasis. No new or progressive findings. 2. Hiatal hernia.  Lab results today (12/20/18) of CBC w/diff and CMP is as follows: all values are WNL except for RBC at 3.41, HGB at 9.9, HCT at 33.1, MCHC at 29.9, RDW at 17.2, Albumin at 2.7. 12/20/18 Vitamin B12 is pending 12/20/18 CEA is pending  On review of systems, pt reports leg pains, good energy levels, improved appetite, eating better, weight gain, moving her bowels well, and denies blood in the stools, black stools, abdominal pains, tingling or numbness in hands or feet, mouth sores, and any other symptoms.  MEDICAL HISTORY:  Past Medical History:  Diagnosis Date   Allergic rhinitis    Anxiety diagnosed in 1990   Arthritis    Asthma    Dyspnea    with low blood count hx of anemia   Eczema    History of blood  transfusion    Hypertension    Lower back pain    Migraine    Sleep apnea    uses CPAP    SURGICAL HISTORY: Past Surgical History:  Procedure Laterality Date   ABDOMINAL HYSTERECTOMY     cortisone injections     knees bilat and back    IR IMAGING GUIDED PORT INSERTION  09/14/2018   iron infusion     LIVER BIOPSY      SOCIAL HISTORY: Social History   Socioeconomic History   Marital status: Single    Spouse name: Not on file   Number of children: Not on file   Years of education: Not on file   Highest education level: Not on file  Occupational History   Not on file  Social Needs   Financial resource strain: Not on file   Food insecurity:    Worry: Not on file    Inability: Not on file   Transportation needs:    Medical: Not  on file    Non-medical: Not on file  Tobacco Use   Smoking status: Former Smoker    Last attempt to quit: 07/30/1999    Years since quitting: 19.4   Smokeless tobacco: Never Used  Substance and Sexual Activity   Alcohol use: No   Drug use: No   Sexual activity: Not on file  Lifestyle   Physical activity:    Days per week: Not on file    Minutes per session: Not on file   Stress: Not on file  Relationships   Social connections:    Talks on phone: Not on file    Gets together: Not on file    Attends religious service: Not on file    Active member of club or organization: Not on file    Attends meetings of clubs or organizations: Not on file    Relationship status: Not on file   Intimate partner violence:    Fear of current or ex partner: Not on file    Emotionally abused: Not on file    Physically abused: Not on file    Forced sexual activity: Not on file  Other Topics Concern   Not on file  Social History Narrative   Not on file    FAMILY HISTORY: No family history on file.  ALLERGIES:  is allergic to latex; tape; and tylenol with codeine #3 [acetaminophen-codeine].  MEDICATIONS:  Current  Outpatient Medications  Medication Sig Dispense Refill   albuterol (PROAIR HFA) 108 (90 Base) MCG/ACT inhaler INHALE TWO puffs BY MOUTH into the lungs EVERY 4 HOURS AS NEEDED FOR WHEEZING OR shortness of breath 18 g 1   amoxicillin-clavulanate (AUGMENTIN) 875-125 MG tablet Take 1 tablet by mouth every 12 (twelve) hours. 10 tablet 0   Boric Acid 4 % SOLN Place 1 capsule vaginally every Friday.      cetirizine (ZYRTEC) 10 MG tablet Take 1 tablet (10 mg total) by mouth daily. 30 tablet 6   clobetasol ointment (TEMOVATE) 5.39 % Apply 1 application topically 2 (two) times daily. Use for Lichen Sclerosis     Cyanocobalamin (B-12) 1000 MCG SUBL Place 1,000 mcg under the tongue daily. 30 each 3   cyclobenzaprine (FLEXERIL) 10 MG tablet Take 10 mg by mouth 3 (three) times daily as needed (muscle spasms.).      dexamethasone (DECADRON) 4 MG tablet Take 2 tablets (8 mg total) by mouth daily. Start the day after chemotherapy for 2 days. Take with food. 30 tablet 1   diazepam (VALIUM) 5 MG tablet Take 1 tablet (5 mg total) by mouth daily as needed for anxiety (panic attacks). 30 tablet 0   diclofenac sodium (VOLTAREN) 1 % GEL Apply 2 g topically 4 (four) times daily as needed (for back/knee pain.).      dicyclomine (BENTYL) 20 MG tablet Take 1 tablet (20 mg total) by mouth 2 (two) times daily. (Patient taking differently: Take 20 mg by mouth 3 (three) times daily before meals. ) 20 tablet 0   dronabinol (MARINOL) 5 MG capsule Take 1 capsule (5 mg total) by mouth 2 (two) times daily before a meal. 60 capsule 0   ergocalciferol (VITAMIN D2) 50000 UNITS capsule Take 50,000 Units by mouth every Monday.      lidocaine-prilocaine (EMLA) cream Apply to affected area once 30 g 3   lisinopril-hydrochlorothiazide (PRINZIDE,ZESTORETIC) 20-12.5 MG tablet Take 1 tablet by mouth daily.     methocarbamol (ROBAXIN) 750 MG tablet Take 750 mg by mouth every 8 (eight)  hours as needed (back spasms.).       montelukast (SINGULAIR) 10 MG tablet Take 1 tablet (10 mg total) by mouth at bedtime. 30 tablet 6   nystatin (NYSTATIN) powder Apply 1 g topically 2 (two) times daily as needed (irritation). Use in skin folds and under breast      nystatin-triamcinolone (MYCOLOG II) cream Apply 1 application topically 2 (two) times daily as needed (irritation). Use in skin folds and under breast      ondansetron (ZOFRAN) 8 MG tablet Take 1 tablet (8 mg total) by mouth 2 (two) times daily as needed for refractory nausea / vomiting. Start on day 3 after chemotherapy. 30 tablet 0   pantoprazole (PROTONIX) 40 MG tablet Take 40 mg by mouth daily before breakfast.   1   PATADAY 0.2 % SOLN Apply 2 drops to eye 2 (two) times daily as needed (for irritated eyes.). 2.5 mL 5   pimecrolimus (ELIDEL) 1 % cream Apply 1 application topically 2 (two) times daily. 30 g 6   prochlorperazine (COMPAZINE) 10 MG tablet Take 1 tablet (10 mg total) by mouth every 6 (six) hours as needed (Nausea or vomiting). 30 tablet 1   saccharomyces boulardii (FLORASTOR) 250 MG capsule Take 1 capsule (250 mg total) by mouth 2 (two) times daily.     SYMBICORT 160-4.5 MCG/ACT inhaler Inhale 2 puffs into the lungs 2 (two) times daily. (Patient taking differently: Inhale 2 puffs into the lungs 2 (two) times daily. ) 10.2 g 3   tiZANidine (ZANAFLEX) 4 MG tablet Take 4 mg by mouth every 8 (eight) hours as needed for spasms.     traMADol (ULTRAM) 50 MG tablet Take 50 mg by mouth every 6 (six) hours as needed for moderate pain (back pain.).      triamcinolone (NASACORT) 55 MCG/ACT AERO nasal inhaler Place 2 sprays into the nose daily as needed (for allergies). 1 Inhaler 3   valACYclovir (VALTREX) 500 MG tablet Take 500 mg by mouth 2 (two) times daily as needed (for cold sores).      vitamin B-12 1000 MCG tablet Take 1 tablet (1,000 mcg total) by mouth daily.     No current facility-administered medications for this visit.     REVIEW OF SYSTEMS:     A 10+ POINT REVIEW OF SYSTEMS WAS OBTAINED including neurology, dermatology, psychiatry, cardiac, respiratory, lymph, extremities, GI, GU, Musculoskeletal, constitutional, breasts, reproductive, HEENT.  All pertinent positives are noted in the HPI.  All others are negative.   PHYSICAL EXAMINATION:   GENERAL:alert, in no acute distress and comfortable SKIN: no acute rashes, no significant lesions EYES: conjunctiva are pink and non-injected, sclera anicteric OROPHARYNX: MMM, no exudates, no oropharyngeal erythema or ulceration NECK: supple, no JVD LYMPH:  no palpable lymphadenopathy in the cervical, axillary or inguinal regions LUNGS: clear to auscultation b/l with normal respiratory effort HEART: regular rate & rhythm ABDOMEN:  normoactive bowel sounds , non tender, not distended. No palpable hepatosplenomegaly.  Extremity: no pedal edema PSYCH: alert & oriented x 3 with fluent speech NEURO: no focal motor/sensory deficits   LABORATORY DATA:  I have reviewed the data as listed  . CBC Latest Ref Rng & Units 12/20/2018 11/22/2018 11/17/2018  WBC 4.0 - 10.5 K/uL 4.5 4.7 2.4(L)  Hemoglobin 12.0 - 15.0 g/dL 9.9(L) 9.8(L) 9.8(L)  Hematocrit 36.0 - 46.0 % 33.1(L) 32.9(L) 32.3(L)  Platelets 150 - 400 K/uL 261 189 148(L)   ANC 900 . CMP Latest Ref Rng & Units 12/20/2018 11/22/2018 11/17/2018  Glucose 70 - 99 mg/dL 85 101(H) 100(H)  BUN 6 - 20 mg/dL '14 13 13  '$ Creatinine 0.44 - 1.00 mg/dL 0.84 0.88 0.87  Sodium 135 - 145 mmol/L 142 142 144  Potassium 3.5 - 5.1 mmol/L 4.2 4.1 3.8  Chloride 98 - 111 mmol/L 109 111 111  CO2 22 - 32 mmol/L '25 26 26  '$ Calcium 8.9 - 10.3 mg/dL 9.0 8.8(L) 8.7(L)  Total Protein 6.5 - 8.1 g/dL 6.9 6.6 6.8  Total Bilirubin 0.3 - 1.2 mg/dL 0.3 0.2(L) 0.2(L)  Alkaline Phos 38 - 126 U/L 71 79 77  AST 15 - 41 U/L '17 24 23  '$ ALT 0 - 44 U/L '15 23 21   '$ . Lab Results  Component Value Date   IRON 24 (L) 09/06/2018   TIBC 156 (L) 09/06/2018   IRONPCTSAT 15 (L) 09/06/2018    (Iron and TIBC)  Lab Results  Component Value Date   FERRITIN 540 (H) 11/03/2018   B12  - 196--> 584  07/22/18 Liver Biopsy:   08/27/18 Repeat Liver Biopsy:    RADIOGRAPHIC STUDIES: I have personally reviewed the radiological images as listed and agreed with the findings in the report. Ct Abdomen Pelvis W Contrast  Result Date: 12/13/2018 CLINICAL DATA:  Restaging colon cancer EXAM: CT ABDOMEN AND PELVIS WITH CONTRAST TECHNIQUE: Multidetector CT imaging of the abdomen and pelvis was performed using the standard protocol following bolus administration of intravenous contrast. CONTRAST:  135m OMNIPAQUE IOHEXOL 300 MG/ML  SOLN COMPARISON:  CT AP 08/27/2018 FINDINGS: Lower chest: Subsegmental atelectasis or scar within the right lower lobe, unchanged. No acute abnormality. Hepatobiliary: Interval improvement in liver metastases. -Posterior right lobe of liver lesion measures 2 cm, image 23/3. Previously 3.7 cm. -Caudate lobe metastasis measures 2.6 cm previously 3.5 cm. -Posterior right lobe of liver metastasis measures 1.5 cm, image 25/3. Previously 2.8 cm. -Along the posteromedial aspect of the right lobe there is a subcapsular lesion measuring 2.4 by 1.5 cm, image 30/3. Previously 6.1 by 3.8 cm. -Along the dome of liver lesion measures 1.2 by 1.5 cm, image 8/3. Previously 2.9 x 3.2 cm. Small gallstone measuring 6 mm.  No biliary dilatation. Pancreas: Unremarkable. No pancreatic ductal dilatation or surrounding inflammatory changes. Spleen: Normal in size without focal abnormality. Adrenals/Urinary Tract: Normal right adrenal gland. Left adrenal gland myelolipoma measures 2 cm. Unchanged. Low-density structure within the upper pole of right kidney is unchanged measuring 2 cm. Inferior pole hypodensity is also unchanged measuring 1 cm. Scarring involving the anterior cortex of the left mid kidney is again noted. Urinary bladder appears unremarkable. Stomach/Bowel: Moderate size hiatal hernia. No  dilated loops of small or large bowel identified. Distal colonic diverticula identified without acute inflammation. Vascular/Lymphatic: No significant vascular findings are present. No enlarged abdominal or pelvic lymph nodes. Reproductive: Status post hysterectomy. No adnexal masses. Other: No abdominal wall hernia or abnormality. No abdominopelvic ascites. Musculoskeletal: Unchanged nonaggressive appearing sclerotic lesion in the right iliac bone measuring 1.4 cm, image 77/3. IMPRESSION: 1. Interval response to therapy. There is been decrease in size of multifocal liver metastasis. No new or progressive findings. 2. Hiatal hernia. Electronically Signed   By: TKerby MoorsM.D.   On: 12/13/2018 21:43    ASSESSMENT & PLAN:   48y.o. female with  1. Iron Deficiency Anemia - ? Previous heavy periods vs GI losses  Recent heavy NSAID use ? Ulcer  2. B12 deficiency Labs upon initial presentation from 04/16/18, HGB at 9.5. The 04/09/18 Ferritin was low  at 6 and Vitamin B12 was at 196 -No antiparietal antibodies, no intrinsic factor antibodies, normal TSH all from 04/28/18  PLAN: -Continue NSAID avoidance  -continue 1063mg Vitamin B12 sublingually daily - B12 improved from 196 to 584  3.MetastaticAdenocarcinoma of cecum. KRAS mutated.  04/16/18 CT A/P with pt revealed No acute findings in the abdomen/pelvis. Least 4 low-density hepatic lesions, some of which may represent small cysts or hemangiomas, however there is a 19 mm lesion in the left lobe of the liver that is incompletely characterized. Recommend nonemergent hepatic protocol MRI for further evaluation..Marland KitchenPossible 11 mm pancreatic nodule rising from the tail, alternatively this may represent a splenule. This can also be assessed at time of hepatic MRI. Gallstone without gallbladder inflammation. Colonic diverticulosis without diverticulitis.   07/09/18 Tumor marker work up: CEA normal at 1.32, AFP normal at 1.5, LDH normal at 138, CA 125  normal at 5.9, CA 19-9 normal at <2   07/22/18 Liver biopsy results revealed benign hepatic tissue, and was not diagnostic   07/16/18 PET/CT revealedThere is a solid hypermetabolic mass involving the cecum, worrisome for colonic primary neoplasm. Correlation withcolonoscopy Findings. 2. Multiple hypermetabolic liver lesions compatible with metastaticdisease. 3. Large low-density lesion in right lobe of thyroid gland. Advise further evaluation with thyroid sonography. 4. Aortic Atherosclerosis. 5. Small hiatal hernia.   08/27/18 Liver biopsy revealed Metastatic adenocarcinoma, consistent with gastrointestinal primary   09/03/18 ECHO revealed an LV EF of 633-54% mild diastolic dysfunction; mild RVE; dilated PA; mild TR; moderate to severe pulmonary hypertension  09/17/18 CEA at 2.39, which was the last CEA pre-treatment value   4. Neutropenia  PLAN: -Discussed pt labwork today, 12/20/18; blood counts and chemistries are stable -12/20/18 CEA is pending. Last available CEA from 09/17/18 was at 2.39 -Discussed the 12/13/18 CT A/P which revealed Interval response to therapy. There is been decrease in size of multifocal liver metastasis. No new or progressive findings. 2. Hiatal hernia. -Holding 5FU bolus due to neurologic symptoms and previous neutropenia, which is improved after beginning Neulasta -Discussed recommendation to add Avastin to FOLFOX, as concerns for bleeding have reduced with treatment response. Will look for BP control and stability as well. Discussed risks of bleeding and blood clots and bleeding as well, and provided supplemental information. Pt would like to begin Avastin with C7. -Ordering additional molecular tests to look at KMinorstatus, will add EGFR therapy if indicated -Ordering Tramadol for leg pains and neulasta related bone pains -The pt has no prohibitive toxicities from continuing C6 FOLFOX with G-CSF support at this time. -Did postpone C6 by one week due to previous  concern for mild infection. Pt has no current concerns for infection. -Discussed that there is not urgency from my perspective for the pt to have a colonoscopy at this time -Discussed again that pt should avoid cold objects and fluids during treatment with FOLFOX, which she has done.  -Given neurotoxicity from Oxaloplatin - grade 1-2 --- will give oxaloplatin over 4 hours instead of 2hours -Recommend saline sprays OTC to prevent further epistaxis  -Recommend soft foods that are easy to digest  -Recommend salt and baking soda mouthwashes -Recommended that the pt continue to eat well, drink at least 48-64 oz of water each day, and walk 20-30 minutes each day. -Will see the pt back with C7 FOLFOX   -PLease schedule next 2 cycles of FOLFOX + Avastin (Avastin is newly being added to treatment plan - extra time needed) with labs and MD visit   All  of the patients questions were answered with apparent satisfaction. The patient knows to call the clinic with any problems, questions or concerns.  The total time spent in the appt was 25 minutes and more than 50% was on counseling and direct patient cares.    Sullivan Lone MD MS AAHIVMS [facility] Hematology/Oncology Physician [facility]  (Office):       [phone] (Work cell):  [phone] (Fax):           [phone]  12/20/2018 11:44 AM  I, Baldwin [place], am acting as a scribe for Dr. Sullivan Lone.   .I have reviewed the above documentation for accuracy and completeness, and I agree with the above. Brunetta Genera MD

## 2018-12-20 ENCOUNTER — Inpatient Hospital Stay (HOSPITAL_BASED_OUTPATIENT_CLINIC_OR_DEPARTMENT_OTHER): Payer: Medicaid Other | Admitting: Hematology

## 2018-12-20 ENCOUNTER — Inpatient Hospital Stay: Payer: Medicaid Other

## 2018-12-20 ENCOUNTER — Other Ambulatory Visit: Payer: Self-pay

## 2018-12-20 ENCOUNTER — Encounter (HOSPITAL_COMMUNITY): Payer: Self-pay | Admitting: Hematology

## 2018-12-20 ENCOUNTER — Telehealth: Payer: Self-pay | Admitting: Hematology

## 2018-12-20 ENCOUNTER — Encounter: Payer: Self-pay | Admitting: Hematology

## 2018-12-20 ENCOUNTER — Inpatient Hospital Stay: Payer: Medicaid Other | Attending: Hematology

## 2018-12-20 VITALS — BP 123/81 | HR 79

## 2018-12-20 VITALS — BP 172/102 | HR 85 | Temp 97.7°F | Resp 20 | Ht 70.0 in | Wt 393.5 lb

## 2018-12-20 DIAGNOSIS — Z7189 Other specified counseling: Secondary | ICD-10-CM

## 2018-12-20 DIAGNOSIS — C189 Malignant neoplasm of colon, unspecified: Secondary | ICD-10-CM

## 2018-12-20 DIAGNOSIS — D509 Iron deficiency anemia, unspecified: Secondary | ICD-10-CM

## 2018-12-20 DIAGNOSIS — C787 Secondary malignant neoplasm of liver and intrahepatic bile duct: Secondary | ICD-10-CM

## 2018-12-20 DIAGNOSIS — Z87891 Personal history of nicotine dependence: Secondary | ICD-10-CM | POA: Insufficient documentation

## 2018-12-20 DIAGNOSIS — Z95828 Presence of other vascular implants and grafts: Secondary | ICD-10-CM

## 2018-12-20 DIAGNOSIS — I10 Essential (primary) hypertension: Secondary | ICD-10-CM | POA: Diagnosis not present

## 2018-12-20 DIAGNOSIS — Z5111 Encounter for antineoplastic chemotherapy: Secondary | ICD-10-CM

## 2018-12-20 DIAGNOSIS — Z5189 Encounter for other specified aftercare: Secondary | ICD-10-CM | POA: Diagnosis not present

## 2018-12-20 DIAGNOSIS — C180 Malignant neoplasm of cecum: Secondary | ICD-10-CM | POA: Insufficient documentation

## 2018-12-20 LAB — CBC WITH DIFFERENTIAL/PLATELET
Abs Immature Granulocytes: 0.02 10*3/uL (ref 0.00–0.07)
Basophils Absolute: 0 10*3/uL (ref 0.0–0.1)
Basophils Relative: 0 %
Eosinophils Absolute: 0.1 10*3/uL (ref 0.0–0.5)
Eosinophils Relative: 2 %
HCT: 33.1 % — ABNORMAL LOW (ref 36.0–46.0)
Hemoglobin: 9.9 g/dL — ABNORMAL LOW (ref 12.0–15.0)
Immature Granulocytes: 0 %
Lymphocytes Relative: 35 %
Lymphs Abs: 1.6 10*3/uL (ref 0.7–4.0)
MCH: 29 pg (ref 26.0–34.0)
MCHC: 29.9 g/dL — ABNORMAL LOW (ref 30.0–36.0)
MCV: 97.1 fL (ref 80.0–100.0)
Monocytes Absolute: 0.6 10*3/uL (ref 0.1–1.0)
Monocytes Relative: 14 %
Neutro Abs: 2.2 10*3/uL (ref 1.7–7.7)
Neutrophils Relative %: 49 %
Platelets: 261 10*3/uL (ref 150–400)
RBC: 3.41 MIL/uL — ABNORMAL LOW (ref 3.87–5.11)
RDW: 17.2 % — ABNORMAL HIGH (ref 11.5–15.5)
WBC: 4.5 10*3/uL (ref 4.0–10.5)
nRBC: 0 % (ref 0.0–0.2)

## 2018-12-20 LAB — CMP (CANCER CENTER ONLY)
ALT: 15 U/L (ref 0–44)
AST: 17 U/L (ref 15–41)
Albumin: 2.7 g/dL — ABNORMAL LOW (ref 3.5–5.0)
Alkaline Phosphatase: 71 U/L (ref 38–126)
Anion gap: 8 (ref 5–15)
BUN: 14 mg/dL (ref 6–20)
CO2: 25 mmol/L (ref 22–32)
Calcium: 9 mg/dL (ref 8.9–10.3)
Chloride: 109 mmol/L (ref 98–111)
Creatinine: 0.84 mg/dL (ref 0.44–1.00)
GFR, Est AFR Am: >60 mL/min (ref >60)
GFR, Estimated: >60 mL/min (ref >60)
Glucose, Bld: 85 mg/dL (ref 70–99)
Potassium: 4.2 mmol/L (ref 3.5–5.1)
Sodium: 142 mmol/L (ref 135–145)
Total Bilirubin: 0.3 mg/dL (ref 0.3–1.2)
Total Protein: 6.9 g/dL (ref 6.5–8.1)

## 2018-12-20 LAB — VITAMIN B12: Vitamin B-12: 1004 pg/mL — ABNORMAL HIGH (ref 180–914)

## 2018-12-20 LAB — CEA (IN HOUSE-CHCC): CEA (CHCC-In House): 1.33 ng/mL (ref 0.00–5.00)

## 2018-12-20 MED ORDER — DEXTROSE 5 % IV SOLN
Freq: Once | INTRAVENOUS | Status: AC
  Administered 2018-12-20: 12:00:00 via INTRAVENOUS
  Filled 2018-12-20: qty 250

## 2018-12-20 MED ORDER — SODIUM CHLORIDE 0.9% FLUSH
10.0000 mL | Freq: Once | INTRAVENOUS | Status: AC
  Administered 2018-12-20: 10 mL
  Filled 2018-12-20: qty 10

## 2018-12-20 MED ORDER — PALONOSETRON HCL INJECTION 0.25 MG/5ML
0.2500 mg | Freq: Once | INTRAVENOUS | Status: AC
  Administered 2018-12-20: 12:00:00 0.25 mg via INTRAVENOUS

## 2018-12-20 MED ORDER — PALONOSETRON HCL INJECTION 0.25 MG/5ML
INTRAVENOUS | Status: AC
  Filled 2018-12-20: qty 5

## 2018-12-20 MED ORDER — OXALIPLATIN CHEMO INJECTION 100 MG/20ML
86.0000 mg/m2 | Freq: Once | INTRAVENOUS | Status: AC
  Administered 2018-12-20: 13:00:00 250 mg via INTRAVENOUS
  Filled 2018-12-20: qty 40

## 2018-12-20 MED ORDER — DEXAMETHASONE SODIUM PHOSPHATE 10 MG/ML IJ SOLN
10.0000 mg | Freq: Once | INTRAMUSCULAR | Status: AC
  Administered 2018-12-20: 12:00:00 10 mg via INTRAVENOUS

## 2018-12-20 MED ORDER — LEUCOVORIN CALCIUM INJECTION 350 MG
400.0000 mg/m2 | Freq: Once | INTRAVENOUS | Status: AC
  Administered 2018-12-20: 13:00:00 1156 mg via INTRAVENOUS
  Filled 2018-12-20: qty 57.8

## 2018-12-20 MED ORDER — TRAMADOL HCL 50 MG PO TABS: 50.0000 mg | ORAL_TABLET | Freq: Four times a day (QID) | ORAL | 0 refills | Status: DC | PRN

## 2018-12-20 MED ORDER — SODIUM CHLORIDE 0.9 % IV SOLN
2400.0000 mg/m2 | INTRAVENOUS | Status: DC
  Administered 2018-12-20: 18:00:00 6950 mg via INTRAVENOUS
  Filled 2018-12-20: qty 139

## 2018-12-20 MED ORDER — DEXAMETHASONE SODIUM PHOSPHATE 10 MG/ML IJ SOLN
INTRAMUSCULAR | Status: AC
  Filled 2018-12-20: qty 1

## 2018-12-20 NOTE — Patient Instructions (Signed)
[facility] Discharge Instructions for Patients Receiving Chemotherapy  Today you received the following chemotherapy agents Oxaliplatin (ELOXATIN), Leucovorin & Flourouracil (ADRUCIL).  To help prevent nausea and vomiting after your treatment, we encourage you to take your nausea medication as prescribed.   If you develop nausea and vomiting that is not controlled by your nausea medication, call the clinic.   BELOW ARE SYMPTOMS THAT SHOULD BE REPORTED IMMEDIATELY:  *FEVER GREATER THAN 100.5 F  *CHILLS WITH OR WITHOUT FEVER  NAUSEA AND VOMITING THAT IS NOT CONTROLLED WITH YOUR NAUSEA MEDICATION  *UNUSUAL SHORTNESS OF BREATH  *UNUSUAL BRUISING OR BLEEDING  TENDERNESS IN MOUTH AND THROAT WITH OR WITHOUT PRESENCE OF ULCERS  *URINARY PROBLEMS  *BOWEL PROBLEMS  UNUSUAL RASH Items with * indicate a potential emergency and should be followed up as soon as possible.  Feel free to call the clinic should you have any questions or concerns. The clinic phone number is [phone].  Please show the [facility] at check-in to the Emergency Department and triage nurse.  Coronavirus (COVID-19) Are you at risk?  Are you at risk for the Coronavirus (COVID-19)?  To be considered HIGH RISK for Coronavirus (COVID-19), you have to meet the following criteria:  . Traveled to [place], [place], [place], [place] or [place]; or in the [place] to [place], [place], [place], or [place]; and have fever, cough, and shortness of breath within the last 2 weeks of travel OR . Been in close contact with a person diagnosed with COVID-19 within the last 2 weeks and have fever, cough, and shortness of breath . IF YOU DO NOT MEET THESE CRITERIA, YOU ARE CONSIDERED LOW RISK FOR COVID-19.  What to do if you are HIGH RISK for COVID-19?  Marland Kitchen If you are having a medical emergency, call 911. . Seek medical care right away. Before you go to a doctor's office, urgent care or emergency  department, call ahead and tell them about your recent travel, contact with someone diagnosed with COVID-19, and your symptoms. You should receive instructions from your physician's office regarding next steps of care.  . When you arrive at healthcare provider, tell the healthcare staff immediately you have returned from visiting [place], [place], [place], [place] or [place]; or traveled in the [place] to [place], [place], [place], or [place]; in the last two weeks or you have been in close contact with a person diagnosed with COVID-19 in the last 2 weeks.   . Tell the health care staff about your symptoms: fever, cough and shortness of breath. . After you have been seen by a medical provider, you will be either: o Tested for (COVID-19) and discharged home on quarantine except to seek medical care if symptoms worsen, and asked to  - Stay home and avoid contact with others until you get your results (4-5 days)  - Avoid travel on public transportation if possible (such as bus, train, or airplane) or o Sent to the Emergency Department by EMS for evaluation, COVID-19 testing, and possible admission depending on your condition and test results.  What to do if you are LOW RISK for COVID-19?  Reduce your risk of any infection by using the same precautions used for avoiding the common cold or flu:  Marland Kitchen Wash your hands often with soap and warm water for at least 20 seconds.  If soap and water are not readily available, use an alcohol-based hand sanitizer with at least 60% alcohol.  Marland Kitchen  If coughing or sneezing, cover your mouth and nose by coughing or sneezing into the elbow areas of your shirt or coat, into a tissue or into your sleeve (not your hands). . Avoid shaking hands with others and consider head nods or verbal greetings only. . Avoid touching your eyes, nose, or mouth with unwashed hands.  . Avoid close contact with people who are sick. . Avoid places or events with large numbers of people  in one location, like concerts or sporting events. . Carefully consider travel plans you have or are making. . If you are planning any travel outside or inside the [place], visit the CDC's Travelers' Health webpage for the latest health notices. . If you have some symptoms but not all symptoms, continue to monitor at home and seek medical attention if your symptoms worsen. . If you are having a medical emergency, call 911.   [place] / e-Visit: [email or web address]         [facility]: [facility]: [phone]                   [facility]: [phone]

## 2018-12-20 NOTE — Telephone Encounter (Signed)
Scheduled appt per 4/6 los.  Added treatment in the book for approval (4/20)

## 2018-12-22 ENCOUNTER — Other Ambulatory Visit: Payer: Self-pay

## 2018-12-22 ENCOUNTER — Inpatient Hospital Stay: Payer: Medicaid Other

## 2018-12-22 VITALS — BP 128/78 | HR 78 | Temp 98.2°F | Resp 18

## 2018-12-22 DIAGNOSIS — Z7189 Other specified counseling: Secondary | ICD-10-CM

## 2018-12-22 DIAGNOSIS — C787 Secondary malignant neoplasm of liver and intrahepatic bile duct: Principal | ICD-10-CM

## 2018-12-22 DIAGNOSIS — Z5111 Encounter for antineoplastic chemotherapy: Secondary | ICD-10-CM | POA: Diagnosis not present

## 2018-12-22 DIAGNOSIS — C189 Malignant neoplasm of colon, unspecified: Secondary | ICD-10-CM

## 2018-12-22 MED ORDER — PEGFILGRASTIM-CBQV 6 MG/0.6ML ~~LOC~~ SOSY
PREFILLED_SYRINGE | SUBCUTANEOUS | Status: AC
  Filled 2018-12-22: qty 0.6

## 2018-12-22 MED ORDER — SODIUM CHLORIDE 0.9% FLUSH
10.0000 mL | INTRAVENOUS | Status: DC | PRN
  Administered 2018-12-22: 10 mL
  Filled 2018-12-22: qty 10

## 2018-12-22 MED ORDER — HEPARIN SOD (PORK) LOCK FLUSH 100 UNIT/ML IV SOLN
500.0000 [IU] | Freq: Once | INTRAVENOUS | Status: AC | PRN
  Administered 2018-12-22: 500 [IU]
  Filled 2018-12-22: qty 5

## 2018-12-22 MED ORDER — PEGFILGRASTIM INJECTION 6 MG/0.6ML ~~LOC~~
6.0000 mg | PREFILLED_SYRINGE | Freq: Once | SUBCUTANEOUS | Status: AC
  Administered 2018-12-22: 6 mg via SUBCUTANEOUS

## 2018-12-22 NOTE — Patient Instructions (Signed)
Pegfilgrastim injection  What is this medicine?  PEGFILGRASTIM (PEG fil gra stim) is a long-acting granulocyte colony-stimulating factor that stimulates the growth of neutrophils, a type of white blood cell important in the body's fight against infection. It is used to reduce the incidence of fever and infection in patients with certain types of cancer who are receiving chemotherapy that affects the bone marrow, and to increase survival after being exposed to high doses of radiation.  This medicine may be used for other purposes; ask your health care provider or pharmacist if you have questions.  COMMON BRAND NAME(S): Fulphila, Neulasta, UDENYCA  What should I tell my health care provider before I take this medicine?  They need to know if you have any of these conditions:  -kidney disease  -latex allergy  -ongoing radiation therapy  -sickle cell disease  -skin reactions to acrylic adhesives (On-Body Injector only)  -an unusual or allergic reaction to pegfilgrastim, filgrastim, other medicines, foods, dyes, or preservatives  -pregnant or trying to get pregnant  -breast-feeding  How should I use this medicine?  This medicine is for injection under the skin. If you get this medicine at home, you will be taught how to prepare and give the pre-filled syringe or how to use the On-body Injector. Refer to the patient Instructions for Use for detailed instructions. Use exactly as directed. Tell your healthcare provider immediately if you suspect that the On-body Injector may not have performed as intended or if you suspect the use of the On-body Injector resulted in a missed or partial dose.  It is important that you put your used needles and syringes in a special sharps container. Do not put them in a trash can. If you do not have a sharps container, call your pharmacist or healthcare provider to get one.  Talk to your pediatrician regarding the use of this medicine in children. While this drug may be prescribed for  selected conditions, precautions do apply.  Overdosage: If you think you have taken too much of this medicine contact a poison control center or emergency room at once.  NOTE: This medicine is only for you. Do not share this medicine with others.  What if I miss a dose?  It is important not to miss your dose. Call your doctor or health care professional if you miss your dose. If you miss a dose due to an On-body Injector failure or leakage, a new dose should be administered as soon as possible using a single prefilled syringe for manual use.  What may interact with this medicine?  Interactions have not been studied.  Give your health care provider a list of all the medicines, herbs, non-prescription drugs, or dietary supplements you use. Also tell them if you smoke, drink alcohol, or use illegal drugs. Some items may interact with your medicine.  This list may not describe all possible interactions. Give your health care provider a list of all the medicines, herbs, non-prescription drugs, or dietary supplements you use. Also tell them if you smoke, drink alcohol, or use illegal drugs. Some items may interact with your medicine.  What should I watch for while using this medicine?  You may need blood work done while you are taking this medicine.  If you are going to need a MRI, CT scan, or other procedure, tell your doctor that you are using this medicine (On-Body Injector only).  What side effects may I notice from receiving this medicine?  Side effects that you should report to   your doctor or health care professional as soon as possible:  -allergic reactions like skin rash, itching or hives, swelling of the face, lips, or tongue  -back pain  -dizziness  -fever  -pain, redness, or irritation at site where injected  -pinpoint red spots on the skin  -red or dark-brown urine  -shortness of breath or breathing problems  -stomach or side pain, or pain at the shoulder  -swelling  -tiredness  -trouble passing urine or  change in the amount of urine  Side effects that usually do not require medical attention (report to your doctor or health care professional if they continue or are bothersome):  -bone pain  -muscle pain  This list may not describe all possible side effects. Call your doctor for medical advice about side effects. You may report side effects to FDA at 1-800-FDA-1088.  Where should I keep my medicine?  Keep out of the reach of children.  If you are using this medicine at home, you will be instructed on how to store it. Throw away any unused medicine after the expiration date on the label.  NOTE: This sheet is a summary. It may not cover all possible information. If you have questions about this medicine, talk to your doctor, pharmacist, or health care provider.   2019 [employer] (2017-12-07 16:57:08)

## 2018-12-24 ENCOUNTER — Telehealth: Payer: Self-pay | Admitting: Hematology

## 2018-12-24 NOTE — Telephone Encounter (Signed)
Returned patient call re changing appointment time on 5/4 and 5/18. Not able to adjust 5/4 appointments - 5/18 appointments moved to later time. Confirmed with patient.

## 2018-12-30 ENCOUNTER — Other Ambulatory Visit: Payer: Self-pay

## 2018-12-30 ENCOUNTER — Encounter: Payer: Self-pay | Admitting: Hematology

## 2018-12-30 ENCOUNTER — Emergency Department (HOSPITAL_COMMUNITY)
Admission: EM | Admit: 2018-12-30 | Discharge: 2018-12-30 | Disposition: A | Discharge status: Home / Self Care | Payer: Medicaid Other | Attending: Emergency Medicine | Admitting: Emergency Medicine

## 2018-12-30 ENCOUNTER — Emergency Department (HOSPITAL_COMMUNITY): Payer: Medicaid Other

## 2018-12-30 DIAGNOSIS — Z79899 Other long term (current) drug therapy: Secondary | ICD-10-CM | POA: Insufficient documentation

## 2018-12-30 DIAGNOSIS — I10 Essential (primary) hypertension: Secondary | ICD-10-CM | POA: Insufficient documentation

## 2018-12-30 DIAGNOSIS — M25561 Pain in right knee: Secondary | ICD-10-CM | POA: Diagnosis not present

## 2018-12-30 DIAGNOSIS — Z87891 Personal history of nicotine dependence: Secondary | ICD-10-CM | POA: Insufficient documentation

## 2018-12-30 DIAGNOSIS — W010XXA Fall on same level from slipping, tripping and stumbling without subsequent striking against object, initial encounter: Secondary | ICD-10-CM | POA: Insufficient documentation

## 2018-12-30 DIAGNOSIS — F419 Anxiety disorder, unspecified: Secondary | ICD-10-CM | POA: Insufficient documentation

## 2018-12-30 DIAGNOSIS — J45909 Unspecified asthma, uncomplicated: Secondary | ICD-10-CM | POA: Insufficient documentation

## 2018-12-30 DIAGNOSIS — Z8505 Personal history of malignant neoplasm of liver: Secondary | ICD-10-CM | POA: Insufficient documentation

## 2018-12-30 DIAGNOSIS — Z91040 Latex allergy status: Secondary | ICD-10-CM | POA: Insufficient documentation

## 2018-12-30 DIAGNOSIS — Z85038 Personal history of other malignant neoplasm of large intestine: Secondary | ICD-10-CM | POA: Diagnosis not present

## 2018-12-30 LAB — CBC WITH DIFFERENTIAL/PLATELET
Abs Immature Granulocytes: 0.1 10*3/uL — ABNORMAL HIGH (ref 0.00–0.07)
Basophils Absolute: 0 10*3/uL (ref 0.0–0.1)
Basophils Relative: 0 %
Eosinophils Absolute: 0 10*3/uL (ref 0.0–0.5)
Eosinophils Relative: 1 %
HCT: 36.9 % (ref 36.0–46.0)
Hemoglobin: 10.9 g/dL — ABNORMAL LOW (ref 12.0–15.0)
Immature Granulocytes: 1 %
Lymphocytes Relative: 20 %
Lymphs Abs: 1.4 10*3/uL (ref 0.7–4.0)
MCH: 29.2 pg (ref 26.0–34.0)
MCHC: 29.5 g/dL — ABNORMAL LOW (ref 30.0–36.0)
MCV: 98.9 fL (ref 80.0–100.0)
Monocytes Absolute: 0.5 10*3/uL (ref 0.1–1.0)
Monocytes Relative: 7 %
Neutro Abs: 5 10*3/uL (ref 1.7–7.7)
Neutrophils Relative %: 71 %
Platelets: 154 10*3/uL (ref 150–400)
RBC: 3.73 MIL/uL — ABNORMAL LOW (ref 3.87–5.11)
RDW: 16.6 % — ABNORMAL HIGH (ref 11.5–15.5)
WBC: 7 10*3/uL (ref 4.0–10.5)
nRBC: 0 % (ref 0.0–0.2)

## 2018-12-30 LAB — BASIC METABOLIC PANEL
Anion gap: 6 (ref 5–15)
BUN: 15 mg/dL (ref 6–20)
CO2: 27 mmol/L (ref 22–32)
Calcium: 8.9 mg/dL (ref 8.9–10.3)
Chloride: 110 mmol/L (ref 98–111)
Creatinine, Ser: 0.95 mg/dL (ref 0.44–1.00)
GFR calc Af Amer: >60 mL/min (ref >60)
GFR calc non Af Amer: >60 mL/min (ref >60)
Glucose, Bld: 96 mg/dL (ref 70–99)
Potassium: 3.9 mmol/L (ref 3.5–5.1)
Sodium: 143 mmol/L (ref 135–145)

## 2018-12-30 NOTE — ED Provider Notes (Signed)
[facility] DEPT Provider Note   CSN: [id number] Arrival date & time: 12/30/18  1553    History   Chief Complaint Chief Complaint  Patient presents with  . Fall  . Knee Injury  . Leg Pain    HPI Margaret [name] is a 48 y.o. female.     48 year old female here for mechanical fall just prior to arrival.  States that she is not had much to eat today due to being nauseated from chemo and when she went to stand up she became lightheaded.  Patient fell and twisted her knee and denies any loss of consciousness.  Did not have any chest pain or shortness of breath prior to the event.  Has not had any recent illnesses.  Pain is characterized as sharp and worse with any movement or standing.  Denies any associated hip ankle or foot pain on the right side.     Past Medical History:  Diagnosis Date  . Allergic rhinitis   . Anxiety diagnosed in 1990  . Arthritis   . Asthma   . Dyspnea    with low blood count hx of anemia  . Eczema   . History of blood transfusion   . Hypertension   . Lower back pain   . Migraine   . Sleep apnea    uses CPAP    Patient Active Problem List   Diagnosis Date Noted  . Port-A-Cath in place 11/03/2018  . Counseling regarding advance care planning and goals of care 09/06/2018  . Colon cancer metastasized to liver ([place])   . Hypokalemia 09/01/2018  . Aspiration pneumonia ([place]) 08/30/2018  . Subcapsular hematoma of liver 08/27/2018  . Obesity, Class III, BMI 40-49.9 (morbid obesity) ([place]) 08/27/2018  . Metastatic carcinoma to liver ([place]) 08/27/2018  . B12 deficiency anemia 08/27/2018  . Dizziness   . Acute blood loss anemia   . Iron deficiency anemia 04/28/2018  . Symptomatic anemia 04/09/2018  . Chest pain 04/09/2018  . Anxiety   . Asthma   . Hypertension   . Migraine     Past Surgical History:  Procedure Laterality Date  . ABDOMINAL HYSTERECTOMY    . cortisone injections     knees bilat and back   . IR  IMAGING GUIDED PORT INSERTION  09/14/2018  . iron infusion    . LIVER BIOPSY       OB History   No obstetric history on file.      Home Medications    Prior to Admission medications   Medication Sig Start Date End Date Taking? Authorizing Provider  albuterol (PROAIR HFA) 108 (90 Base) MCG/ACT inhaler INHALE TWO puffs BY MOUTH into the lungs EVERY 4 HOURS AS NEEDED FOR WHEEZING OR shortness of breath 08/19/18   Valentina Shaggy, MD  amoxicillin-clavulanate (AUGMENTIN) 875-125 MG tablet Take 1 tablet by mouth every 12 (twelve) hours. 09/03/18   Georgette Shell, MD  Boric Acid 4 % SOLN Place 1 capsule vaginally every Friday.     [provider]  cetirizine (ZYRTEC) 10 MG tablet Take 1 tablet (10 mg total) by mouth daily. 07/06/18   Valentina Shaggy, MD  clobetasol ointment (TEMOVATE) 5.80 % Apply 1 application topically 2 (two) times daily. Use for Lichen Sclerosis    Bovard-Stuckert, Jody, MD  Cyanocobalamin (B-12) 1000 MCG SUBL Place 1,000 mcg under the tongue daily. 04/28/18   Brunetta Genera, MD  cyclobenzaprine (FLEXERIL) 10 MG tablet Take 10 mg by mouth 3 (  three) times daily as needed (muscle spasms.).     [provider]  dexamethasone (DECADRON) 4 MG tablet Take 2 tablets (8 mg total) by mouth daily. Start the day after chemotherapy for 2 days. Take with food. 09/06/18   Brunetta Genera, MD  diazepam (VALIUM) 5 MG tablet Take 1 tablet (5 mg total) by mouth daily as needed for anxiety (panic attacks). 11/19/18   Brunetta Genera, MD  diclofenac sodium (VOLTAREN) 1 % GEL Apply 2 g topically 4 (four) times daily as needed (for back/knee pain.).     [provider]  dicyclomine (BENTYL) 20 MG tablet Take 1 tablet (20 mg total) by mouth 2 (two) times daily. Patient taking differently: Take 20 mg by mouth 3 (three) times daily before meals.  04/16/18   Doristine Devoid, PA-C  dronabinol (MARINOL) 5 MG capsule Take 1 capsule (5 mg total)  by mouth 2 (two) times daily before a meal. 12/02/18   Brunetta Genera, MD  ergocalciferol (VITAMIN D2) 50000 UNITS capsule Take 50,000 Units by mouth every Monday.     [provider]  lidocaine-prilocaine (EMLA) cream Apply to affected area once 09/06/18   Brunetta Genera, MD  lisinopril-hydrochlorothiazide (PRINZIDE,ZESTORETIC) 20-12.5 MG tablet Take 1 tablet by mouth daily.    [provider]  methocarbamol (ROBAXIN) 750 MG tablet Take 750 mg by mouth every 8 (eight) hours as needed (back spasms.).     [provider]  montelukast (SINGULAIR) 10 MG tablet Take 1 tablet (10 mg total) by mouth at bedtime. 07/06/18   Valentina Shaggy, MD  nystatin (NYSTATIN) powder Apply 1 g topically 2 (two) times daily as needed (irritation). Use in skin folds and under breast     Bovard-Stuckert, Jody, MD  nystatin-triamcinolone (MYCOLOG II) cream Apply 1 application topically 2 (two) times daily as needed (irritation). Use in skin folds and under breast     Bovard-Stuckert, Jody, MD  ondansetron (ZOFRAN) 8 MG tablet Take 1 tablet (8 mg total) by mouth 2 (two) times daily as needed for refractory nausea / vomiting. Start on day 3 after chemotherapy. 10/20/18   Brunetta Genera, MD  pantoprazole (PROTONIX) 40 MG tablet Take 40 mg by mouth daily before breakfast.  06/24/18   [provider]  PATADAY 0.2 % SOLN Apply 2 drops to eye 2 (two) times daily as needed (for irritated eyes.). 07/30/18   Valentina Shaggy, MD  pimecrolimus (ELIDEL) 1 % cream Apply 1 application topically 2 (two) times daily. 07/06/18   Valentina Shaggy, MD  prochlorperazine (COMPAZINE) 10 MG tablet Take 1 tablet (10 mg total) by mouth every 6 (six) hours as needed (Nausea or vomiting). 10/14/18   Brunetta Genera, MD  saccharomyces boulardii (FLORASTOR) 250 MG capsule Take 1 capsule (250 mg total) by mouth 2 (two) times daily. 09/03/18   Georgette Shell, MD  SYMBICORT 160-4.5  MCG/ACT inhaler Inhale 2 puffs into the lungs 2 (two) times daily. Patient taking differently: Inhale 2 puffs into the lungs 2 (two) times daily.  08/05/18   Valentina Shaggy, MD  tiZANidine (ZANAFLEX) 4 MG tablet Take 4 mg by mouth every 8 (eight) hours as needed for spasms.    [provider]  traMADol (ULTRAM) 50 MG tablet Take 1 tablet (50 mg total) by mouth every 6 (six) hours as needed for moderate pain or severe pain. 12/20/18   Brunetta Genera, MD  triamcinolone (NASACORT) 55 MCG/ACT AERO nasal inhaler Place  2 sprays into the nose daily as needed (for allergies). 07/06/18   Valentina Shaggy, MD  valACYclovir (VALTREX) 500 MG tablet Take 500 mg by mouth 2 (two) times daily as needed (for cold sores).     [provider]  vitamin B-12 1000 MCG tablet Take 1 tablet (1,000 mcg total) by mouth daily. 09/04/18   Georgette Shell, MD    Family History No family history on file.  Social History Social History   Tobacco Use  . Smoking status: Former Smoker    Last attempt to quit: 07/30/1999    Years since quitting: 19.4  . Smokeless tobacco: Never Used  Substance Use Topics  . Alcohol use: No  . Drug use: No     Allergies   Latex; Tape; and Tylenol with codeine #3 [acetaminophen-codeine]   Review of Systems Review of Systems  All other systems reviewed and are negative.    Physical Exam Updated Vital Signs BP (!) 146/86 (BP Location: Right Arm)   Pulse 88   Temp 98.6 F (37 C) (Oral)   SpO2 100%   Physical Exam Vitals signs and nursing note reviewed.  Constitutional:      General: She is not in acute distress.    Appearance: Normal appearance. She is well-developed. She is not toxic-appearing.  HENT:     Head: Normocephalic and atraumatic.  Eyes:     General: Lids are normal.     Conjunctiva/sclera: Conjunctivae normal.     Pupils: Pupils are equal, round, and reactive to light.  Neck:     Musculoskeletal: Normal range of  motion and neck supple.     Thyroid: No thyroid mass.     Trachea: No tracheal deviation.  Cardiovascular:     Rate and Rhythm: Normal rate and regular rhythm.     Heart sounds: Normal heart sounds. No murmur. No gallop.   Pulmonary:     Effort: Pulmonary effort is normal. No respiratory distress.     Breath sounds: Normal breath sounds. No stridor. No decreased breath sounds, wheezing, rhonchi or rales.  Abdominal:     General: Bowel sounds are normal. There is no distension.     Palpations: Abdomen is soft.     Tenderness: There is no abdominal tenderness. There is no rebound.  Musculoskeletal:        General: No tenderness.     Right knee: She exhibits decreased range of motion. She exhibits no effusion and no deformity.       Legs:     Comments: No right hip decreased range of motion or pain.  Exam of right foot and ankle are normal as well.  Skin:    General: Skin is warm and dry.     Findings: No abrasion or rash.  Neurological:     Mental Status: She is alert and oriented to person, place, and time.     GCS: GCS eye subscore is 4. GCS verbal subscore is 5. GCS motor subscore is 6.     Cranial Nerves: No cranial nerve deficit.     Sensory: No sensory deficit.  Psychiatric:        Speech: Speech normal.        Behavior: Behavior normal.      ED Treatments / Results  Labs (all labs ordered are listed, but only abnormal results are displayed) Labs Reviewed  CBC WITH DIFFERENTIAL/PLATELET  BASIC METABOLIC PANEL    EKG None  Radiology No results found.  Procedures Procedures (  including critical care time)  Medications Ordered in ED Medications - No data to display   Initial Impression / Assessment and Plan / ED Course  I have reviewed the triage vital signs and the nursing notes.  Pertinent labs & imaging results that were available during my care of the patient were reviewed by me and considered in my medical decision making (see chart for details).         Patient's x-rays negative.  She is not orthostatic.  Blood work is reassuring and stable for discharge and will follow-up with her orthopedist  Final Clinical Impressions(s) / ED Diagnoses   Final diagnoses:  None    ED Discharge Orders    None       Lacretia Leigh, MD 12/30/18 [id number]

## 2018-12-30 NOTE — ED Notes (Signed)
Bed: WTR7 Expected date:  Expected time:  Means of arrival:  Comments: EMS-fall/knee pain

## 2018-12-30 NOTE — ED Notes (Signed)
Ortho tech called for Knee sleeve placement

## 2018-12-30 NOTE — ED Triage Notes (Signed)
Per EMS- Patient states she felt light headed and fell. Patient states she always gets like that when she does not eat and has not eaten today. Patient c/o right knee pain. Small bruise noted.

## 2018-12-30 NOTE — ED Triage Notes (Signed)
Pt is alert and orinted x 4 and is verbally responsive. Pt states that she was reaching for her cane in the bathroom and became lightheaded next thing she was on the floor pt is c/o rt leg pain some bruising and swelling is noted 10/10 pain. Pt has hx of colon cancer stage 4 with mets to liver and received last chemo last week. Pt denies hx of diabetes. No sob.

## 2018-12-31 ENCOUNTER — Telehealth: Payer: Self-pay | Admitting: *Deleted

## 2018-12-31 NOTE — Telephone Encounter (Signed)
Spoke with pt and was informed that pt fell yesterday.  Went to ER, and had xray which showed torn ligaments.  Pt was instructed to call her orthopedist for follow up.   Pt stated she can hardly walk - severe burning in Right knee down to leg and swollen.  Stated she would not be able to come in for appts on Monday 01/03/19 including chemo treatment. Pt would like to reschedule appts.   Message routed to Dr. Irene Limbo for further instructions on Monday AM.

## 2019-01-03 ENCOUNTER — Inpatient Hospital Stay: Payer: Medicaid Other | Admitting: Hematology

## 2019-01-03 ENCOUNTER — Other Ambulatory Visit: Payer: Medicaid Other

## 2019-01-03 ENCOUNTER — Inpatient Hospital Stay: Payer: Medicaid Other

## 2019-01-05 ENCOUNTER — Ambulatory Visit: Payer: Medicaid Other

## 2019-01-10 ENCOUNTER — Telehealth: Payer: Self-pay | Admitting: *Deleted

## 2019-01-10 NOTE — Telephone Encounter (Signed)
Orders faxed to [facility] [phone] for increased home care hours. Per home nurse [place] - patient has experienced a decline in overall conditioning d/t chemotherapy. She is experiencing nausea, vomiting, generalized weakness (leading to fall and injury of L knee). She is unable to provide own meal preparation. Experiences shortness of breath with any exertion, including all ADLs. This information was documented on order form. Fax confirmatio received.

## 2019-01-14 ENCOUNTER — Encounter: Payer: Self-pay | Admitting: General Practice

## 2019-01-14 ENCOUNTER — Encounter: Payer: Self-pay | Admitting: Hematology

## 2019-01-14 ENCOUNTER — Telehealth: Payer: Self-pay | Admitting: *Deleted

## 2019-01-14 NOTE — Progress Notes (Signed)
[facility] Support Services   [facility] Support Services Team contacted patient to assess for food insecurity and other psychosocial needs during current COVID19 pandemic.  No answer, left VM.  Anne C Cunningham, LCSW  [facility]        

## 2019-01-14 NOTE — Telephone Encounter (Signed)
Contacted patient in response to My Chart message:  "I am currently needing new prescriptions sent to [facility] located [place] [address]; phone  [phone]. Because I'm having issues with my current prescriptions being transferred from [facility] over to [facility] and this is Effective immediately please"  She said the medicines she needed transferred were Tramadol and Dexamethasone. She says she has enough of each at this time, it was just for in the future prescriptions. She will explain to Dr. Irene Limbo on Monday during appt.  Explained to patient that pharmacies should be able to transfer her prescriptions. Patient explained that [facility] will not transfer or release prescriptions to [facility]. She has contacted [facility] with a complaint. She asked that her pharmacy be changed in her chart.     Pharmacy changed in chart to [facility]

## 2019-01-14 NOTE — Progress Notes (Signed)
HEMATOLOGY/ONCOLOGY CLINIC NOTE  Date of Service: 01/17/2019  Patient Care Team: Nolene Ebbs, MD as PCP - General (Internal Medicine) Jackelyn Knife, MD as Rounding Team (Internal Medicine)  CHIEF COMPLAINTS/PURPOSE OF CONSULTATION:  Recently diagnosed Metastatic Adenocarcinoma of colon  HISTORY OF PRESENTING ILLNESS:   Margaret [name] is a wonderful 48 y.o. female who has been referred to [place] by Dr. Nolene Ebbs for evaluation and management of Iron deficiency anemia. The pt reports that she is doing well overall.   The pt recently presented to the ED on 04/16/18 for right sided abdominal pain that was evaluated with a CT A/P which revealed liver and pancreatic cyst, recommended for outpatient MRI follow up. The pt was found to have a UTI, was treated with Rocephin and discharged with Keflex. Prior to this the pt was admitted on 04/09/18 for symptomatic anemia, presenting with HGB at 6.2, received 2 units PRBCs, one IV Iron infusion, and was encouraged to seek out GI as outpatient.  She has an appointment with [facility] on 05/19/18.   The pt reports that she is still feeling dizzy and light headed and denies feeling better after her recent blood transfusion. She notes that she dizziness presents with a feeling of warmness and that she begins hyperventilating, feeling nervous about her dizziness.  She notes that prior to her recent hospital stay, she never required a blood transfusion or IV iron replacement. She notes that as a child she had frequent nose bleeds, and was diagnosed with anemia. She also notes a history of ice picca.   She had a hysterectomy in 2012, and prior to this she had very heavy periods that began at age 13 and occurred almost constantly. She was on Depo and Mirena which did not help slow menstrual losses. Besides taking iron supplements while pregnant she has not taken PO iron replacement as she reports not tolerating it very well while pregnant.   She denies  concern for black stools or blood in the stools and has not had a colonoscopy or endoscopy before. The pt notes that she has not previously had a concern for stomach ulcers, but has acid reflux and takes Prevacid as needed for 7-8 years. She notes that she has not recently used Prevacid, for the last 3 months.  She is not taking vitamin replacements and denies any dietary restrictions.  The pt notes that she has been continuing to have lower right quadrant abdominal pain that radiates across her abdomen, presents for up to 45 minutes and occurs intermittently. She notes that her abdominal pain presented on 04/13/18.   The pt notes that was taking Ibuprofen 837m BID for 8 years to address her back pain related to her herniated disk. She stopped taking this 4 months ago.   Of note prior to the patient's visit today, pt has had CT A/P completed on 04/16/18 with results revealing No acute findings in the abdomen/pelvis. Least 4 low-density hepatic lesions, some of which may represent small cysts or hemangiomas, however there is a 19 mm lesion in the left lobe of the liver that is incompletely characterized. Recommend nonemergent hepatic protocol MRI for further evaluation..Marland KitchenPossible 11 mm pancreatic nodule rising from the tail, alternatively this may represent a splenule. This can also be assessed at time of hepatic MRI. Gallstone without gallbladder inflammation. Colonic diverticulosis without diverticulitis.   Most recent lab results (04/16/18) of CBC is as follows: all values are WNL except for HGB at 9.5, HCT at  33.6, MCH at 23.8, MCHC at 28.3, RDW at 23.2, PLT at 435k. Ferritin 04/09/18 was low at 6 Vitamin B12 on 04/09/18 was at 196  On review of systems, pt reports light headedness, dizziness, lower back pain, intermittent abdominal pain, and denies black stools, blood in the stools, tingling or numbness in her legs or arms, vaginal bleeding, and any other symptoms.  Interval History:   Margaret [name] returns today for management, evaluation, and C7 FOLFOX treatment of her newly diagnosed Metastatic Adenocarcinoma of GI primary. The patient's last visit with [place] was on 12/20/18. The pt reports that she is doing well overall.  The pt reports that she saw orthopedist Dr. Alfonso Ramus in the interim as she had a fall on 12/30/18 in which she "fell directly on her knee from a standing position," and injured her right knee. Her XR were negative for fracture, which were obtained when she presented to the ED on 12/30/18. The pt notes that she is taking Keflex with her orthopedist, for the last 4 days. She currently endorses right knee swelling and soreness. She also endorses right calf pain. She notes that the swelling in her knee is overall improved since her initial fall. She describes a "burning sensation" in the inside of her right knee. She notes that she will return to care with her orthopedist later this week.  The pt denies SOB or changes in breathing.  Lab results today (01/17/19) of CBC w/diff and CMP is as follows: all values are WNL except for RBC at 3.60, HGB at 10.5, HCT at 35.3, MCHC at 29.7, Calcium at 8.7, Albumin at 3.0. 01/17/19 CEA is pending  On review of systems, pt reports right knee swelling, concern for infection, stable energy levels, and denies SOB, changes in breathing, fevers, and any other symptoms.   MEDICAL HISTORY:  Past Medical History:  Diagnosis Date   Allergic rhinitis    Anxiety diagnosed in 1990   Arthritis    Asthma    Dyspnea    with low blood count hx of anemia   Eczema    History of blood transfusion    Hypertension    Lower back pain    Migraine    Sleep apnea    uses CPAP    SURGICAL HISTORY: Past Surgical History:  Procedure Laterality Date   ABDOMINAL HYSTERECTOMY     cortisone injections     knees bilat and back    IR IMAGING GUIDED PORT INSERTION  09/14/2018   iron infusion     LIVER BIOPSY      SOCIAL HISTORY: Social  History   Socioeconomic History   Marital status: Single    Spouse name: Not on file   Number of children: Not on file   Years of education: Not on file   Highest education level: Not on file  Occupational History   Not on file  Social Needs   Financial resource strain: Not on file   Food insecurity:    Worry: Not on file    Inability: Not on file   Transportation needs:    Medical: Not on file    Non-medical: Not on file  Tobacco Use   Smoking status: Former Smoker    Last attempt to quit: 07/30/1999    Years since quitting: 19.4   Smokeless tobacco: Never Used  Substance and Sexual Activity   Alcohol use: No   Drug use: No   Sexual activity: Not on file  Lifestyle  Physical activity:    Days per week: Not on file    Minutes per session: Not on file   Stress: Not on file  Relationships   Social connections:    Talks on phone: Not on file    Gets together: Not on file    Attends religious service: Not on file    Active member of club or organization: Not on file    Attends meetings of clubs or organizations: Not on file    Relationship status: Not on file   Intimate partner violence:    Fear of current or ex partner: Not on file    Emotionally abused: Not on file    Physically abused: Not on file    Forced sexual activity: Not on file  Other Topics Concern   Not on file  Social History Narrative   Not on file    FAMILY HISTORY: No family history on file.  ALLERGIES:  is allergic to latex; tape; and tylenol with codeine #3 [acetaminophen-codeine].  MEDICATIONS:  Current Outpatient Medications  Medication Sig Dispense Refill   albuterol (PROAIR HFA) 108 (90 Base) MCG/ACT inhaler INHALE TWO puffs BY MOUTH into the lungs EVERY 4 HOURS AS NEEDED FOR WHEEZING OR shortness of breath 18 g 1   amoxicillin-clavulanate (AUGMENTIN) 875-125 MG tablet Take 1 tablet by mouth every 12 (twelve) hours. (Patient not taking: Reported on 12/30/2018) 10  tablet 0   Boric Acid 4 % SOLN Place 1 capsule vaginally every Friday.      cetirizine (ZYRTEC) 10 MG tablet Take 1 tablet (10 mg total) by mouth daily. 30 tablet 6   clobetasol ointment (TEMOVATE) 0.09 % Apply 1 application topically 2 (two) times daily. Use for Lichen Sclerosis     Cyanocobalamin (B-12) 1000 MCG SUBL Place 1,000 mcg under the tongue daily. (Patient not taking: Reported on 12/30/2018) 30 each 3   dexamethasone (DECADRON) 4 MG tablet Take 2 tablets (8 mg total) by mouth daily. Start the day after chemotherapy for 2 days. Take with food. 30 tablet 1   diazepam (VALIUM) 5 MG tablet Take 1 tablet (5 mg total) by mouth daily as needed for anxiety (panic attacks). 30 tablet 0   diclofenac sodium (VOLTAREN) 1 % GEL Apply 2 g topically 4 (four) times daily as needed (for back/knee pain.).      dicyclomine (BENTYL) 20 MG tablet Take 1 tablet (20 mg total) by mouth 2 (two) times daily. (Patient taking differently: Take 20 mg by mouth 3 (three) times daily before meals. ) 20 tablet 0   dronabinol (MARINOL) 5 MG capsule Take 1 capsule (5 mg total) by mouth 2 (two) times daily before a meal. 60 capsule 0   ergocalciferol (VITAMIN D2) 50000 UNITS capsule Take 50,000 Units by mouth every Monday.      lidocaine-prilocaine (EMLA) cream Apply to affected area once 30 g 3   lisinopril-hydrochlorothiazide (PRINZIDE,ZESTORETIC) 20-12.5 MG tablet Take 1 tablet by mouth daily.     methocarbamol (ROBAXIN) 750 MG tablet Take 750 mg by mouth every 8 (eight) hours as needed (back spasms.).      montelukast (SINGULAIR) 10 MG tablet Take 1 tablet (10 mg total) by mouth at bedtime. 30 tablet 6   nystatin (NYSTATIN) powder Apply 1 g topically 2 (two) times daily as needed (irritation). Use in skin folds and under breast      nystatin-triamcinolone (MYCOLOG II) cream Apply 1 application topically 2 (two) times daily as needed (irritation). Use in skin folds and  under breast      ondansetron  (ZOFRAN) 8 MG tablet Take 1 tablet (8 mg total) by mouth 2 (two) times daily as needed for refractory nausea / vomiting. Start on day 3 after chemotherapy. (Patient not taking: Reported on 12/30/2018) 30 tablet 0   pantoprazole (PROTONIX) 40 MG tablet Take 40 mg by mouth daily before breakfast.   1   PATADAY 0.2 % SOLN Apply 2 drops to eye 2 (two) times daily as needed (for irritated eyes.). 2.5 mL 5   pimecrolimus (ELIDEL) 1 % cream Apply 1 application topically 2 (two) times daily. 30 g 6   prochlorperazine (COMPAZINE) 10 MG tablet Take 1 tablet (10 mg total) by mouth every 6 (six) hours as needed (Nausea or vomiting). 30 tablet 1   saccharomyces boulardii (FLORASTOR) 250 MG capsule Take 1 capsule (250 mg total) by mouth 2 (two) times daily.     SYMBICORT 160-4.5 MCG/ACT inhaler Inhale 2 puffs into the lungs 2 (two) times daily. (Patient taking differently: Inhale 2 puffs into the lungs 2 (two) times daily. ) 10.2 g 3   traMADol (ULTRAM) 50 MG tablet Take 1 tablet (50 mg total) by mouth every 6 (six) hours as needed for moderate pain or severe pain. 60 tablet 0   triamcinolone (NASACORT) 55 MCG/ACT AERO nasal inhaler Place 2 sprays into the nose daily as needed (for allergies). 1 Inhaler 3   valACYclovir (VALTREX) 500 MG tablet Take 500 mg by mouth 2 (two) times daily as needed (for cold sores).      vitamin B-12 1000 MCG tablet Take 1 tablet (1,000 mcg total) by mouth daily.     No current facility-administered medications for this visit.     REVIEW OF SYSTEMS:    A 10+ POINT REVIEW OF SYSTEMS WAS OBTAINED including neurology, dermatology, psychiatry, cardiac, respiratory, lymph, extremities, GI, GU, Musculoskeletal, constitutional, breasts, reproductive, HEENT.  All pertinent positives are noted in the HPI.  All others are negative.   PHYSICAL EXAMINATION:   GENERAL:alert, in no acute distress and comfortable SKIN: no acute rashes, no significant lesions EYES: conjunctiva are pink  and non-injected, sclera anicteric OROPHARYNX: MMM, no exudates, no oropharyngeal erythema or ulceration NECK: supple, no JVD LYMPH:  no palpable lymphadenopathy in the cervical, axillary or inguinal regions LUNGS: clear to auscultation b/l with normal respiratory effort HEART: regular rate & rhythm ABDOMEN:  normoactive bowel sounds , non tender, not distended. No palpable hepatosplenomegaly.  Extremity: significant pain, induration, and erythema with some warmth over right knee with swelling extending into right calf with TTP to right calf PSYCH: alert & oriented x 3 with fluent speech NEURO: no focal motor/sensory deficits   LABORATORY DATA:  I have reviewed the data as listed  . CBC Latest Ref Rng & Units 01/17/2019 12/30/2018 12/20/2018  WBC 4.0 - 10.5 K/uL 5.4 7.0 4.5  Hemoglobin 12.0 - 15.0 g/dL 10.5(L) 10.9(L) 9.9(L)  Hematocrit 36.0 - 46.0 % 35.3(L) 36.9 33.1(L)  Platelets 150 - 400 K/uL 294 154 261   ANC 900 . CMP Latest Ref Rng & Units 01/17/2019 12/30/2018 12/20/2018  Glucose 70 - 99 mg/dL 90 96 85  BUN 6 - 20 mg/dL _0 Creatinine 0.44 - 1.00 mg/dL 0.84 0.95 0.84  Sodium 135 - 145 mmol/L 144 143 142  Potassium 3.5 - 5.1 mmol/L 4.4 3.9 4.2  Chloride 98 - 111 mmol/L 109 110 109  CO2 22 - 32 mmol/L _1 Calcium 8.9 - 10.3 mg/dL  8.7(L) 8.9 9.0  Total Protein 6.5 - 8.1 g/dL 7.3 - 6.9  Total Bilirubin 0.3 - 1.2 mg/dL 0.3 - 0.3  Alkaline Phos 38 - 126 U/L 87 - 71  AST 15 - 41 U/L 18 - 17  ALT 0 - 44 U/L 12 - 15   . Lab Results  Component Value Date   IRON 24 (L) 09/06/2018   TIBC 156 (L) 09/06/2018   IRONPCTSAT 15 (L) 09/06/2018   (Iron and TIBC)  Lab Results  Component Value Date   FERRITIN 540 (H) 11/03/2018   B12  - 196--> 584  07/22/18 Liver Biopsy:   08/27/18 Repeat Liver Biopsy:    RADIOGRAPHIC STUDIES: I have personally reviewed the radiological images as listed and agreed with the findings in the report. Dg Knee Complete 4 Views  Right  Result Date: 12/30/2018 CLINICAL DATA:  Fall.  Pain EXAM: RIGHT KNEE - COMPLETE 4+ VIEW COMPARISON:  None. FINDINGS: Negative for fracture. Joint spaces maintained. Mild patellar spurring. IMPRESSION: Negative for fracture. Electronically Signed   By: Franchot Gallo M.D.   On: 12/30/2018 17:22    ASSESSMENT & PLAN:   48 y.o. female with  1. Iron Deficiency Anemia - ? Previous heavy periods vs GI losses  Recent heavy NSAID use ? Ulcer  2. B12 deficiency Labs upon initial presentation from 04/16/18, HGB at 9.5. The 04/09/18 Ferritin was low at 6 and Vitamin B12 was at 196 -No antiparietal antibodies, no intrinsic factor antibodies, normal TSH all from 04/28/18  PLAN: -Continue NSAID avoidance  -continue 1087mg Vitamin B12 sublingually daily - B12 improved from 196 to 584  3.MetastaticAdenocarcinoma of cecum. KRAS mutated.  04/16/18 CT A/P with pt revealed No acute findings in the abdomen/pelvis. Least 4 low-density hepatic lesions, some of which may represent small cysts or hemangiomas, however there is a 19 mm lesion in the left lobe of the liver that is incompletely characterized. Recommend nonemergent hepatic protocol MRI for further evaluation..Marland KitchenPossible 11 mm pancreatic nodule rising from the tail, alternatively this may represent a splenule. This can also be assessed at time of hepatic MRI. Gallstone without gallbladder inflammation. Colonic diverticulosis without diverticulitis.   07/09/18 Tumor marker work up: CEA normal at 1.32, AFP normal at 1.5, LDH normal at 138, CA 125 normal at 5.9, CA 19-9 normal at <2   07/22/18 Liver biopsy results revealed benign hepatic tissue, and was not diagnostic   07/16/18 PET/CT revealedThere is a solid hypermetabolic mass involving the cecum, worrisome for colonic primary neoplasm. Correlation withcolonoscopy Findings. 2. Multiple hypermetabolic liver lesions compatible with metastaticdisease. 3. Large low-density lesion in right lobe  of thyroid gland. Advise further evaluation with thyroid sonography. 4. Aortic Atherosclerosis. 5. Small hiatal hernia.   08/27/18 Liver biopsy revealed Metastatic adenocarcinoma, consistent with gastrointestinal primary   09/03/18 ECHO revealed an LV EF of 616-01% mild diastolic dysfunction; mild RVE; dilated PA; mild TR; moderate to severe pulmonary hypertension  09/17/18 CEA at 2.39, which was the last CEA pre-treatment value   12/13/18 CT A/P revealed Interval response to therapy. There is been decrease in size of multifocal liver metastasis. No new or progressive findings. 2. Hiatal hernia.   4. Neutropenia  PLAN: -Discussed pt labwork today, 01/17/19; blood counts and chemistries are stable -Discussed that as the pt has concern for infection, having started antibiotics with her orthopedist for her recent right knee injury, this is prohibitive from continuing C7 FOLFOX with G-CSF support at this time. -Recommend keeping right leg elevated -Will  order VAS [place] of RLE to rule out blood clots--- neg for DVT -Follow up with orthopedics on 01/20/19 -Advised that if pt develops fevers or worse symptoms, she should present to the ED as she may require IV antibiotics -Will tentatively resume C7 in 2 weeks -Holding 5FU bolus due to neurologic symptoms and previous neutropenia, which is improved after beginning Neulasta -Discussed recommendation to add Avastin to FOLFOX, as concerns for bleeding have reduced with treatment response. Will look for BP control and stability as well. Discussed risks of bleeding and blood clots and bleeding as well, and provided supplemental information. Pt would like to begin Avastin with C7. -Ordering additional molecular tests to look at Kras status, will add EGFR therapy if indicated -Ordering Tramadol for leg pains and neulasta related bone pains -Did postpone C6 by one week due to previous concern for mild infection. -Discussed that there is not urgency from my  perspective for the pt to have a colonoscopy at this time -Discussed again that pt should avoid cold objects and fluids during treatment with FOLFOX, which she has done.  -Given neurotoxicity from Oxaloplatin - grade 1-2 --- will give oxaloplatin over 4 hours instead of 2hours -Recommend saline sprays OTC to prevent further epistaxis  -Recommend soft foods that are easy to digest  -Recommend salt and baking soda mouthwashes -Recommended that the pt continue to eat well, drink at least 48-64 oz of water each day, and walk 20-30 minutes each day. -Will see the pt back in 2 weeks   -Cancel treatment cycle scheduled for 5/4 and appointment on 5/6- due to cellulitis -US venous lower extremity today/tommorrow to r/o DVT -RTC as per scheduled appointments on 5/18   All of the patients questions were answered with apparent satisfaction. The patient knows to call the clinic with any problems, questions or concerns.  The total time spent in the appt was 30 minutes and more than 50% was on counseling and direct patient cares.    Sullivan Lone MD MS AAHIVMS [facility] Hematology/Oncology [facility]  (Office):       [phone] (Work cell):  [phone] (Fax):           [phone]  01/17/2019 11:17 AM  I, Baldwin [place], am acting as a scribe for Dr. Sullivan Lone.   .I have reviewed the above documentation for accuracy and completeness, and I agree with the above. Brunetta Genera MD

## 2019-01-17 ENCOUNTER — Other Ambulatory Visit: Payer: Self-pay

## 2019-01-17 ENCOUNTER — Telehealth: Payer: Self-pay | Admitting: Hematology

## 2019-01-17 ENCOUNTER — Inpatient Hospital Stay: Payer: Medicaid Other | Attending: Hematology

## 2019-01-17 ENCOUNTER — Inpatient Hospital Stay: Payer: Medicaid Other

## 2019-01-17 ENCOUNTER — Inpatient Hospital Stay (HOSPITAL_BASED_OUTPATIENT_CLINIC_OR_DEPARTMENT_OTHER): Payer: Medicaid Other | Admitting: Hematology

## 2019-01-17 VITALS — BP 122/96 | HR 92 | Temp 97.7°F | Resp 18 | Ht 70.0 in | Wt 397.9 lb

## 2019-01-17 DIAGNOSIS — Z7189 Other specified counseling: Secondary | ICD-10-CM

## 2019-01-17 DIAGNOSIS — C180 Malignant neoplasm of cecum: Secondary | ICD-10-CM | POA: Diagnosis not present

## 2019-01-17 DIAGNOSIS — C787 Secondary malignant neoplasm of liver and intrahepatic bile duct: Secondary | ICD-10-CM | POA: Diagnosis not present

## 2019-01-17 DIAGNOSIS — D509 Iron deficiency anemia, unspecified: Secondary | ICD-10-CM | POA: Insufficient documentation

## 2019-01-17 DIAGNOSIS — Z5111 Encounter for antineoplastic chemotherapy: Secondary | ICD-10-CM

## 2019-01-17 DIAGNOSIS — D709 Neutropenia, unspecified: Secondary | ICD-10-CM | POA: Diagnosis not present

## 2019-01-17 DIAGNOSIS — Z95828 Presence of other vascular implants and grafts: Secondary | ICD-10-CM

## 2019-01-17 DIAGNOSIS — C189 Malignant neoplasm of colon, unspecified: Secondary | ICD-10-CM

## 2019-01-17 DIAGNOSIS — M7989 Other specified soft tissue disorders: Secondary | ICD-10-CM | POA: Diagnosis not present

## 2019-01-17 LAB — CMP (CANCER CENTER ONLY)
ALT: 12 U/L (ref 0–44)
AST: 18 U/L (ref 15–41)
Albumin: 3 g/dL — ABNORMAL LOW (ref 3.5–5.0)
Alkaline Phosphatase: 87 U/L (ref 38–126)
Anion gap: 8 (ref 5–15)
BUN: 11 mg/dL (ref 6–20)
CO2: 27 mmol/L (ref 22–32)
Calcium: 8.7 mg/dL — ABNORMAL LOW (ref 8.9–10.3)
Chloride: 109 mmol/L (ref 98–111)
Creatinine: 0.84 mg/dL (ref 0.44–1.00)
GFR, Est AFR Am: >60 mL/min (ref >60)
GFR, Estimated: >60 mL/min (ref >60)
Glucose, Bld: 90 mg/dL (ref 70–99)
Potassium: 4.4 mmol/L (ref 3.5–5.1)
Sodium: 144 mmol/L (ref 135–145)
Total Bilirubin: 0.3 mg/dL (ref 0.3–1.2)
Total Protein: 7.3 g/dL (ref 6.5–8.1)

## 2019-01-17 LAB — CBC WITH DIFFERENTIAL/PLATELET
Abs Immature Granulocytes: 0.03 10*3/uL (ref 0.00–0.07)
Basophils Absolute: 0 10*3/uL (ref 0.0–0.1)
Basophils Relative: 0 %
Eosinophils Absolute: 0.1 10*3/uL (ref 0.0–0.5)
Eosinophils Relative: 1 %
HCT: 35.3 % — ABNORMAL LOW (ref 36.0–46.0)
Hemoglobin: 10.5 g/dL — ABNORMAL LOW (ref 12.0–15.0)
Immature Granulocytes: 1 %
Lymphocytes Relative: 33 %
Lymphs Abs: 1.8 10*3/uL (ref 0.7–4.0)
MCH: 29.2 pg (ref 26.0–34.0)
MCHC: 29.7 g/dL — ABNORMAL LOW (ref 30.0–36.0)
MCV: 98.1 fL (ref 80.0–100.0)
Monocytes Absolute: 0.6 10*3/uL (ref 0.1–1.0)
Monocytes Relative: 11 %
Neutro Abs: 2.9 10*3/uL (ref 1.7–7.7)
Neutrophils Relative %: 54 %
Platelets: 294 10*3/uL (ref 150–400)
RBC: 3.6 MIL/uL — ABNORMAL LOW (ref 3.87–5.11)
RDW: 15.2 % (ref 11.5–15.5)
WBC: 5.4 10*3/uL (ref 4.0–10.5)
nRBC: 0 % (ref 0.0–0.2)

## 2019-01-17 LAB — CEA (IN HOUSE-CHCC): CEA (CHCC-In House): 1.05 ng/mL (ref 0.00–5.00)

## 2019-01-17 MED ORDER — TRAMADOL HCL 50 MG PO TABS: 50.0000 mg | ORAL_TABLET | Freq: Four times a day (QID) | ORAL | 0 refills | Status: DC | PRN

## 2019-01-17 MED ORDER — SODIUM CHLORIDE 0.9% FLUSH
10.0000 mL | Freq: Once | INTRAVENOUS | Status: AC
  Administered 2019-01-17: 12:00:00 10 mL
  Filled 2019-01-17: qty 10

## 2019-01-17 MED ORDER — HEPARIN SOD (PORK) LOCK FLUSH 100 UNIT/ML IV SOLN
500.0000 [IU] | Freq: Once | INTRAVENOUS | Status: AC
  Administered 2019-01-17: 500 [IU]
  Filled 2019-01-17: qty 5

## 2019-01-17 MED ORDER — PROCHLORPERAZINE MALEATE 10 MG PO TABS: 10.0000 mg | ORAL_TABLET | Freq: Four times a day (QID) | ORAL | 1 refills | Status: DC | PRN

## 2019-01-17 MED ORDER — SODIUM CHLORIDE 0.9% FLUSH
10.0000 mL | Freq: Once | INTRAVENOUS | Status: AC
  Administered 2019-01-17: 10 mL
  Filled 2019-01-17: qty 10

## 2019-01-17 MED ORDER — DRONABINOL 5 MG PO CAPS: 5.0000 mg | ORAL_CAPSULE | Freq: Two times a day (BID) | ORAL | 0 refills | Status: AC

## 2019-01-17 MED ORDER — DEXAMETHASONE 4 MG PO TABS: 8.0000 mg | ORAL_TABLET | Freq: Every day | ORAL | 1 refills | Status: DC

## 2019-01-17 NOTE — Telephone Encounter (Signed)
Per 5/4 los, appts already cancelled and scheduled.

## 2019-01-19 ENCOUNTER — Other Ambulatory Visit: Payer: Self-pay

## 2019-01-19 ENCOUNTER — Telehealth: Payer: Self-pay | Admitting: *Deleted

## 2019-01-19 ENCOUNTER — Ambulatory Visit (HOSPITAL_COMMUNITY)
Admission: RE | Admit: 2019-01-19 | Discharge: 2019-01-19 | Disposition: A | Discharge status: Home / Self Care | Payer: Medicaid Other | Source: Ambulatory Visit | Attending: Hematology | Admitting: Hematology

## 2019-01-19 ENCOUNTER — Ambulatory Visit: Payer: Medicaid Other

## 2019-01-19 DIAGNOSIS — M7989 Other specified soft tissue disorders: Secondary | ICD-10-CM | POA: Insufficient documentation

## 2019-01-19 NOTE — Telephone Encounter (Signed)
Contacted by Marya Amsler [facility] U/S: RLE negative for DVT. Msg given to Dr. Irene Limbo.

## 2019-01-19 NOTE — Progress Notes (Signed)
Right lower extremity venous duplex has been completed. Preliminary results can be found in CV Proc through chart review.  Results were given to [facility] at Dr. Grier Mitts office.  01/19/19 11:24 AM Margaret [name] RVT

## 2019-01-28 ENCOUNTER — Encounter: Payer: Self-pay | Admitting: Hematology

## 2019-01-31 ENCOUNTER — Inpatient Hospital Stay: Payer: Medicaid Other

## 2019-01-31 ENCOUNTER — Other Ambulatory Visit: Payer: Medicaid Other

## 2019-01-31 ENCOUNTER — Ambulatory Visit: Payer: Medicaid Other

## 2019-01-31 ENCOUNTER — Ambulatory Visit: Payer: Medicaid Other | Admitting: Hematology

## 2019-01-31 ENCOUNTER — Inpatient Hospital Stay: Payer: Medicaid Other | Admitting: Hematology

## 2019-02-01 ENCOUNTER — Telehealth: Payer: Self-pay | Admitting: *Deleted

## 2019-02-01 ENCOUNTER — Telehealth: Payer: Self-pay | Admitting: Hematology

## 2019-02-01 NOTE — Telephone Encounter (Signed)
Patient originally had to move appts scheduled for 5/18 & 5/20 due to being in the middle of a move and requested they be on 6/1 & 6/3. She is packing and moving.  Now, moving was delayed to landlord and will be moving 6/1 to 6/3, so she can't come then either.  She needs to reschedule the appts to 6/8 and 6/10. Informed her that Dr. Irene Limbo will be informed as this could impact treatment scheduled and will call her back once new schedule can be arranged. She verbalized understanding.

## 2019-02-01 NOTE — Telephone Encounter (Signed)
Spoke with patient re scheduling for 6/1 and 6/3 per 5/15 schedule message. Patient now has new move date and cannot come 6/1 and 6/3. Patient needs to come 6/8 and 6/10. Scheduled patient for 6/8 and 6/10. Confirmed with patient and desk nurse.  Due to patient's transportation patient needs appointments to remain same time as previously cancelled appointment. Due to patient transportation and time constraints used 6/8 new patient slot. Message routed to provider.

## 2019-02-02 ENCOUNTER — Ambulatory Visit: Payer: Medicaid Other

## 2019-02-02 NOTE — Telephone Encounter (Signed)
Contacted patient. Per Dr.Kale, he understands that she cannot come sooner. Her treatments will be pushed out until 6/8 and 6/10. Will notify scheduling that the dates are confirmed.

## 2019-02-17 ENCOUNTER — Encounter: Payer: Self-pay | Admitting: Hematology

## 2019-02-18 NOTE — Progress Notes (Signed)
HEMATOLOGY/ONCOLOGY CLINIC NOTE  Date of Service: 02/21/2019  Patient Care Team: Nolene Ebbs, MD as PCP - General (Internal Medicine) Jackelyn Knife, MD as Rounding Team (Internal Medicine)  CHIEF COMPLAINTS/PURPOSE OF CONSULTATION:  Recently diagnosed Metastatic Adenocarcinoma of colon  HISTORY OF PRESENTING ILLNESS:   Margaret [name] is a wonderful 48 y.o. female who has been referred to [place] by Dr. Nolene Ebbs for evaluation and management of Iron deficiency anemia. The pt reports that she is doing well overall.   The pt recently presented to the ED on 04/16/18 for right sided abdominal pain that was evaluated with a CT A/P which revealed liver and pancreatic cyst, recommended for outpatient MRI follow up. The pt was found to have a UTI, was treated with Rocephin and discharged with Keflex. Prior to this the pt was admitted on 04/09/18 for symptomatic anemia, presenting with HGB at 6.2, received 2 units PRBCs, one IV Iron infusion, and was encouraged to seek out GI as outpatient.  She has an appointment with [facility] on 05/19/18.   The pt reports that she is still feeling dizzy and light headed and denies feeling better after her recent blood transfusion. She notes that she dizziness presents with a feeling of warmness and that she begins hyperventilating, feeling nervous about her dizziness.  She notes that prior to her recent hospital stay, she never required a blood transfusion or IV iron replacement. She notes that as a child she had frequent nose bleeds, and was diagnosed with anemia. She also notes a history of ice picca.   She had a hysterectomy in 2012, and prior to this she had very heavy periods that began at age 8 and occurred almost constantly. She was on Depo and Mirena which did not help slow menstrual losses. Besides taking iron supplements while pregnant she has not taken PO iron replacement as she reports not tolerating it very well while pregnant.   She denies  concern for black stools or blood in the stools and has not had a colonoscopy or endoscopy before. The pt notes that she has not previously had a concern for stomach ulcers, but has acid reflux and takes Prevacid as needed for 7-8 years. She notes that she has not recently used Prevacid, for the last 3 months.  She is not taking vitamin replacements and denies any dietary restrictions.  The pt notes that she has been continuing to have lower right quadrant abdominal pain that radiates across her abdomen, presents for up to 45 minutes and occurs intermittently. She notes that her abdominal pain presented on 04/13/18.   The pt notes that was taking Ibuprofen 820m BID for 8 years to address her back pain related to her herniated disk. She stopped taking this 4 months ago.   Of note prior to the patient's visit today, pt has had CT A/P completed on 04/16/18 with results revealing No acute findings in the abdomen/pelvis. Least 4 low-density hepatic lesions, some of which may represent small cysts or hemangiomas, however there is a 19 mm lesion in the left lobe of the liver that is incompletely characterized. Recommend nonemergent hepatic protocol MRI for further evaluation..Marland KitchenPossible 11 mm pancreatic nodule rising from the tail, alternatively this may represent a splenule. This can also be assessed at time of hepatic MRI. Gallstone without gallbladder inflammation. Colonic diverticulosis without diverticulitis.   Most recent lab results (04/16/18) of CBC is as follows: all values are WNL except for HGB at 9.5, HCT at 33.6,  MCH at 23.8, MCHC at 28.3, RDW at 23.2, PLT at 435k. Ferritin 04/09/18 was low at 6 Vitamin B12 on 04/09/18 was at 196  On review of systems, pt reports light headedness, dizziness, lower back pain, intermittent abdominal pain, and denies black stools, blood in the stools, tingling or numbness in her legs or arms, vaginal bleeding, and any other symptoms.  Interval History:   [name] returns today for management, evaluation, and C7 FOLFOX treatment of her newly diagnosed Metastatic Adenocarcinoma of GI primary. The patient's last visit with [place] was on 01/17/19. The pt reports that she is doing well overall.  The pt reports that her right knee swelling has reduced. The pt followed up with orthopedics and has completed her antibiotic course and her knee is no longer warm to the touch. She notes that her knee is hard now, and that she has not had any further imaging. She continues to have significant pain in the center of her knee and on the outer aspect as well. She is ambulating with a cane and is able to bear weight on her right leg. She has been taking 147m Tramadol every 6 hours. She denies constipation, blood in the stools, or abdominal pains. She notes that she has been eating well.  Lab results today (02/21/19) of CBC w/diff and CMP is as follows: all values are WNL except for RBC at 3.82, HGB at 10.9, MCHC at 29.6, Albumin at 3.0, AST at 14.  On review of systems, pt reports eating well, right knee pain, and denies constipation, blood in the stools, abdominal pains, calf pain, concerns for infections, and any other symptoms.    MEDICAL HISTORY:  Past Medical History:  Diagnosis Date  . Allergic rhinitis   . Anxiety diagnosed in 1990  . Arthritis   . Asthma   . Dyspnea    with low blood count hx of anemia  . Eczema   . History of blood transfusion   . Hypertension   . Lower back pain   . Migraine   . Sleep apnea    uses CPAP    SURGICAL HISTORY: Past Surgical History:  Procedure Laterality Date  . ABDOMINAL HYSTERECTOMY    . cortisone injections     knees bilat and back   . IR IMAGING GUIDED PORT INSERTION  09/14/2018  . iron infusion    . LIVER BIOPSY      SOCIAL HISTORY: Social History   Socioeconomic History  . Marital status: Single    Spouse name: Not on file  . Number of children: Not on file  . Years of education: Not on file  .  Highest education level: Not on file  Occupational History  . Not on file  Social Needs  . Financial resource strain: Not on file  . Food insecurity:    Worry: Not on file    Inability: Not on file  . Transportation needs:    Medical: Not on file    Non-medical: Not on file  Tobacco Use  . Smoking status: Former Smoker    Last attempt to quit: 07/30/1999    Years since quitting: 19.5  . Smokeless tobacco: Never Used  Substance and Sexual Activity  . Alcohol use: No  . Drug use: No  . Sexual activity: Not on file  Lifestyle  . Physical activity:    Days per week: Not on file    Minutes per session: Not on file  . Stress: Not on file  Relationships  . Social connections:    Talks on phone: Not on file    Gets together: Not on file    Attends religious service: Not on file    Active member of club or organization: Not on file    Attends meetings of clubs or organizations: Not on file    Relationship status: Not on file  . Intimate partner violence:    Fear of current or ex partner: Not on file    Emotionally abused: Not on file    Physically abused: Not on file    Forced sexual activity: Not on file  Other Topics Concern  . Not on file  Social History Narrative  . Not on file    FAMILY HISTORY: No family history on file.  ALLERGIES:  is allergic to latex; tape; and tylenol with codeine #3 [acetaminophen-codeine].  MEDICATIONS:  Current Outpatient Medications  Medication Sig Dispense Refill  . albuterol (PROAIR HFA) 108 (90 Base) MCG/ACT inhaler INHALE TWO puffs BY MOUTH into the lungs EVERY 4 HOURS AS NEEDED FOR WHEEZING OR shortness of breath 18 g 1  . amoxicillin-clavulanate (AUGMENTIN) 875-125 MG tablet Take 1 tablet by mouth every 12 (twelve) hours. (Patient not taking: Reported on 12/30/2018) 10 tablet 0  . Boric Acid 4 % SOLN Place 1 capsule vaginally every Friday.     . cetirizine (ZYRTEC) 10 MG tablet Take 1 tablet (10 mg total) by mouth daily. 30 tablet 6   . clobetasol ointment (TEMOVATE) 4.25 % Apply 1 application topically 2 (two) times daily. Use for Lichen Sclerosis    . Cyanocobalamin (B-12) 1000 MCG SUBL Place 1,000 mcg under the tongue daily. (Patient not taking: Reported on 12/30/2018) 30 each 3  . dexamethasone (DECADRON) 4 MG tablet Take 2 tablets (8 mg total) by mouth daily. Start the day after chemotherapy for 2 days. Take with food. 30 tablet 1  . diazepam (VALIUM) 5 MG tablet Take 1 tablet (5 mg total) by mouth daily as needed for anxiety (panic attacks). 30 tablet 0  . diclofenac sodium (VOLTAREN) 1 % GEL Apply 2 g topically 4 (four) times daily as needed (for back/knee pain.).     Marland Kitchen dicyclomine (BENTYL) 20 MG tablet Take 1 tablet (20 mg total) by mouth 2 (two) times daily. (Patient taking differently: Take 20 mg by mouth 3 (three) times daily before meals. ) 20 tablet 0  . dronabinol (MARINOL) 5 MG capsule Take 1 capsule (5 mg total) by mouth 2 (two) times daily before a meal. 60 capsule 0  . ergocalciferol (VITAMIN D2) 50000 UNITS capsule Take 50,000 Units by mouth every Monday.     . lidocaine-prilocaine (EMLA) cream Apply to affected area once 30 g 3  . lisinopril-hydrochlorothiazide (PRINZIDE,ZESTORETIC) 20-12.5 MG tablet Take 1 tablet by mouth daily.    . methocarbamol (ROBAXIN) 750 MG tablet Take 750 mg by mouth every 8 (eight) hours as needed (back spasms.).     Marland Kitchen montelukast (SINGULAIR) 10 MG tablet Take 1 tablet (10 mg total) by mouth at bedtime. 30 tablet 6  . nystatin (NYSTATIN) powder Apply 1 g topically 2 (two) times daily as needed (irritation). Use in skin folds and under breast     . nystatin-triamcinolone (MYCOLOG II) cream Apply 1 application topically 2 (two) times daily as needed (irritation). Use in skin folds and under breast     . ondansetron (ZOFRAN) 8 MG tablet Take 1 tablet (8 mg total) by mouth 2 (two) times daily as needed for  refractory nausea / vomiting. Start on day 3 after chemotherapy. (Patient not taking:  Reported on 12/30/2018) 30 tablet 0  . pantoprazole (PROTONIX) 40 MG tablet Take 40 mg by mouth daily before breakfast.   1  . PATADAY 0.2 % SOLN Apply 2 drops to eye 2 (two) times daily as needed (for irritated eyes.). 2.5 mL 5  . pimecrolimus (ELIDEL) 1 % cream Apply 1 application topically 2 (two) times daily. 30 g 6  . prochlorperazine (COMPAZINE) 10 MG tablet Take 1 tablet (10 mg total) by mouth every 6 (six) hours as needed (Nausea or vomiting). 30 tablet 1  . saccharomyces boulardii (FLORASTOR) 250 MG capsule Take 1 capsule (250 mg total) by mouth 2 (two) times daily.    . SYMBICORT 160-4.5 MCG/ACT inhaler Inhale 2 puffs into the lungs 2 (two) times daily. (Patient taking differently: Inhale 2 puffs into the lungs 2 (two) times daily. ) 10.2 g 3  . traMADol (ULTRAM) 50 MG tablet Take 1 tablet (50 mg total) by mouth every 6 (six) hours as needed for moderate pain or severe pain. 60 tablet 0  . triamcinolone (NASACORT) 55 MCG/ACT AERO nasal inhaler Place 2 sprays into the nose daily as needed (for allergies). 1 Inhaler 3  . valACYclovir (VALTREX) 500 MG tablet Take 500 mg by mouth 2 (two) times daily as needed (for cold sores).     . vitamin B-12 1000 MCG tablet Take 1 tablet (1,000 mcg total) by mouth daily.     No current facility-administered medications for this visit.     REVIEW OF SYSTEMS:    A 10+ POINT REVIEW OF SYSTEMS WAS OBTAINED including neurology, dermatology, psychiatry, cardiac, respiratory, lymph, extremities, GI, GU, Musculoskeletal, constitutional, breasts, reproductive, HEENT.  All pertinent positives are noted in the HPI.  All others are negative.   PHYSICAL EXAMINATION:   GENERAL:alert, in no acute distress and comfortable SKIN: no acute rashes, no significant lesions EYES: conjunctiva are pink and non-injected, sclera anicteric OROPHARYNX: MMM, no exudates, no oropharyngeal erythema or ulceration NECK: supple, no JVD LYMPH:  no palpable lymphadenopathy in the  cervical, axillary or inguinal regions LUNGS: clear to auscultation b/l with normal respiratory effort HEART: regular rate & rhythm ABDOMEN:  normoactive bowel sounds , non tender, not distended. No palpable hepatosplenomegaly.  Extremity: no pedal edema PSYCH: alert & oriented x 3 with fluent speech NEURO: no focal motor/sensory deficits   LABORATORY DATA:  I have reviewed the data as listed  . CBC Latest Ref Rng & Units 02/21/2019 01/17/2019 12/30/2018  WBC 4.0 - 10.5 K/uL 5.0 5.4 7.0  Hemoglobin 12.0 - 15.0 g/dL 10.9(L) 10.5(L) 10.9(L)  Hematocrit 36.0 - 46.0 % 36.8 35.3(L) 36.9  Platelets 150 - 400 K/uL 262 294 154   ANC 900 . CMP Latest Ref Rng & Units 02/21/2019 01/17/2019 12/30/2018  Glucose 70 - 99 mg/dL 81 90 96  BUN 6 - 20 mg/dL _0 Creatinine 0.44 - 1.00 mg/dL 0.93 0.84 0.95  Sodium 135 - 145 mmol/L 144 144 143  Potassium 3.5 - 5.1 mmol/L 4.3 4.4 3.9  Chloride 98 - 111 mmol/L 110 109 110  CO2 22 - 32 mmol/L _1 Calcium 8.9 - 10.3 mg/dL 8.9 8.7(L) 8.9  Total Protein 6.5 - 8.1 g/dL 7.4 7.3 -  Total Bilirubin 0.3 - 1.2 mg/dL 0.3 0.3 -  Alkaline Phos 38 - 126 U/L 84 87 -  AST 15 - 41 U/L 14(L) 18 -  ALT 0 -  44 U/L 10 12 -   . Lab Results  Component Value Date   IRON 24 (L) 09/06/2018   TIBC 156 (L) 09/06/2018   IRONPCTSAT 15 (L) 09/06/2018   (Iron and TIBC)  Lab Results  Component Value Date   FERRITIN 540 (H) 11/03/2018   B12  - 196--> 584  07/22/18 Liver Biopsy:   08/27/18 Repeat Liver Biopsy:   Molecular Pathology:     RADIOGRAPHIC STUDIES: I have personally reviewed the radiological images as listed and agreed with the findings in the report. No results found.  ASSESSMENT & PLAN:   48 y.o. female with  1. Iron Deficiency Anemia - ? Previous heavy periods vs GI losses  Recent heavy NSAID use ? Ulcer  2. B12 deficiency Labs upon initial presentation from 04/16/18, HGB at 9.5. The 04/09/18 Ferritin was low at 6 and Vitamin B12 was at 196  -No antiparietal antibodies, no intrinsic factor antibodies, normal TSH all from 04/28/18  PLAN: -Continue NSAID avoidance  -continue 1062mg Vitamin B12 sublingually daily - B12 improved from 196 to 584  3.MetastaticAdenocarcinoma of cecum with liver mets. KRAS mutated.  04/16/18 CT A/P with pt revealed No acute findings in the abdomen/pelvis. Least 4 low-density hepatic lesions, some of which may represent small cysts or hemangiomas, however there is a 19 mm lesion in the left lobe of the liver that is incompletely characterized. Recommend nonemergent hepatic protocol MRI for further evaluation..Marland KitchenPossible 11 mm pancreatic nodule rising from the tail, alternatively this may represent a splenule. This can also be assessed at time of hepatic MRI. Gallstone without gallbladder inflammation. Colonic diverticulosis without diverticulitis.   07/09/18 Tumor marker work up: CEA normal at 1.32, AFP normal at 1.5, LDH normal at 138, CA 125 normal at 5.9, CA 19-9 normal at <2   07/22/18 Liver biopsy results revealed benign hepatic tissue, and was not diagnostic   07/16/18 PET/CT revealedThere is a solid hypermetabolic mass involving the cecum, worrisome for colonic primary neoplasm. Correlation withcolonoscopy Findings. 2. Multiple hypermetabolic liver lesions compatible with metastaticdisease. 3. Large low-density lesion in right lobe of thyroid gland. Advise further evaluation with thyroid sonography. 4. Aortic Atherosclerosis. 5. Small hiatal hernia.   08/27/18 Liver biopsy revealed Metastatic adenocarcinoma, consistent with gastrointestinal primary 08/27/18 Molecular pathology- KRAS mutation positive   09/03/18 ECHO revealed an LV EF of 610-93% mild diastolic dysfunction; mild RVE; dilated PA; mild TR; moderate to severe pulmonary hypertension  09/17/18 CEA at 2.39, which was the last CEA pre-treatment value   12/13/18 CT A/P revealed Interval response to therapy. There is been decrease in size  of multifocal liver metastasis. No new or progressive findings. 2. Hiatal hernia.   Did postpone C6 by one week due to concern for mild infection.   Did postpone C7 by two weeks for infection  4. S/p Neutropenia  PLAN: -Discussed pt labwork today, 02/21/19; blood counts and chemistries are stable -The pt has no prohibitive toxicities from continuing C7 FOLFOLX and Avastin at this time. Beginning Avastin with C7 . -Will order right knee MRI; 01/19/19 [place] -Advised that if pt develops fevers or worse symptoms, she should present to the ED as she may require IV antibiotics -Holding 5FU bolus due to neurologic symptoms and previous neutropenia, which is improved after beginning Neulasta -Ordering Tramadol for leg pains and neulasta related bone pains -Discussed that there is not urgency from my perspective for the pt to have a colonoscopy at this time -Discussed again that pt should  avoid cold objects and fluids during treatment with FOLFOX, which she has done.  -Given neurotoxicity from Oxaloplatin - grade 1-2 --- will give oxaloplatin over 4 hours instead of 2hours -Recommend saline sprays OTC to prevent further epistaxis  -Recommend soft foods that are easy to digest  -Recommend salt and baking soda mouthwashes -Recommended that the pt continue to eat well, drink at least 48-64 oz of water each day, and walk 20-30 minutes each day. -patient refused neulasta shot - will monitor -Will see the pt back in 2 weeks   MRI rt knee in 1 week Please schedule next cycle of FOLFOX + Avastin in 2 weeks with labs and MD visit   All of the patients questions were answered with apparent satisfaction. The patient knows to call the clinic with any problems, questions or concerns.  The total time spent in the appt was 25 minutes and more than 50% was on counseling and direct patient cares.    Sullivan Lone MD MS AAHIVMS [facility] Hematology/Oncology Physician [facility]   (Office):       [phone] (Work cell):  [phone] (Fax):           [phone]  02/21/2019 12:11 PM  I, Baldwin Jamaica, am acting as a scribe for Dr. Sullivan Lone.   .I have reviewed the above documentation for accuracy and completeness, and I agree with the above. Brunetta Genera MD

## 2019-02-21 ENCOUNTER — Inpatient Hospital Stay: Payer: Medicaid Other | Attending: Hematology | Admitting: Hematology

## 2019-02-21 ENCOUNTER — Inpatient Hospital Stay: Payer: Medicaid Other

## 2019-02-21 ENCOUNTER — Telehealth: Payer: Self-pay | Admitting: Hematology

## 2019-02-21 ENCOUNTER — Other Ambulatory Visit: Payer: Self-pay

## 2019-02-21 VITALS — BP 136/81 | HR 76 | Temp 98.2°F | Resp 17 | Ht 70.0 in | Wt 390.5 lb

## 2019-02-21 DIAGNOSIS — C787 Secondary malignant neoplasm of liver and intrahepatic bile duct: Secondary | ICD-10-CM | POA: Insufficient documentation

## 2019-02-21 DIAGNOSIS — E538 Deficiency of other specified B group vitamins: Secondary | ICD-10-CM | POA: Diagnosis not present

## 2019-02-21 DIAGNOSIS — R1031 Right lower quadrant pain: Secondary | ICD-10-CM | POA: Diagnosis not present

## 2019-02-21 DIAGNOSIS — R064 Hyperventilation: Secondary | ICD-10-CM | POA: Insufficient documentation

## 2019-02-21 DIAGNOSIS — Z7189 Other specified counseling: Secondary | ICD-10-CM

## 2019-02-21 DIAGNOSIS — D509 Iron deficiency anemia, unspecified: Secondary | ICD-10-CM | POA: Insufficient documentation

## 2019-02-21 DIAGNOSIS — D709 Neutropenia, unspecified: Secondary | ICD-10-CM | POA: Insufficient documentation

## 2019-02-21 DIAGNOSIS — K219 Gastro-esophageal reflux disease without esophagitis: Secondary | ICD-10-CM | POA: Diagnosis not present

## 2019-02-21 DIAGNOSIS — I700 Atherosclerosis of aorta: Secondary | ICD-10-CM | POA: Insufficient documentation

## 2019-02-21 DIAGNOSIS — R42 Dizziness and giddiness: Secondary | ICD-10-CM | POA: Insufficient documentation

## 2019-02-21 DIAGNOSIS — K449 Diaphragmatic hernia without obstruction or gangrene: Secondary | ICD-10-CM | POA: Insufficient documentation

## 2019-02-21 DIAGNOSIS — Z5111 Encounter for antineoplastic chemotherapy: Secondary | ICD-10-CM | POA: Diagnosis not present

## 2019-02-21 DIAGNOSIS — R040 Epistaxis: Secondary | ICD-10-CM | POA: Insufficient documentation

## 2019-02-21 DIAGNOSIS — Z5112 Encounter for antineoplastic immunotherapy: Secondary | ICD-10-CM | POA: Diagnosis present

## 2019-02-21 DIAGNOSIS — M25561 Pain in right knee: Secondary | ICD-10-CM | POA: Insufficient documentation

## 2019-02-21 DIAGNOSIS — M545 Low back pain: Secondary | ICD-10-CM | POA: Diagnosis not present

## 2019-02-21 DIAGNOSIS — Z87891 Personal history of nicotine dependence: Secondary | ICD-10-CM

## 2019-02-21 DIAGNOSIS — C180 Malignant neoplasm of cecum: Secondary | ICD-10-CM | POA: Insufficient documentation

## 2019-02-21 DIAGNOSIS — Z95828 Presence of other vascular implants and grafts: Secondary | ICD-10-CM

## 2019-02-21 DIAGNOSIS — C189 Malignant neoplasm of colon, unspecified: Secondary | ICD-10-CM

## 2019-02-21 DIAGNOSIS — Z79899 Other long term (current) drug therapy: Secondary | ICD-10-CM | POA: Diagnosis not present

## 2019-02-21 DIAGNOSIS — Z90710 Acquired absence of both cervix and uterus: Secondary | ICD-10-CM | POA: Diagnosis not present

## 2019-02-21 LAB — CBC WITH DIFFERENTIAL/PLATELET
Abs Immature Granulocytes: 0.02 10*3/uL (ref 0.00–0.07)
Basophils Absolute: 0 10*3/uL (ref 0.0–0.1)
Basophils Relative: 0 %
Eosinophils Absolute: 0.1 10*3/uL (ref 0.0–0.5)
Eosinophils Relative: 2 %
HCT: 36.8 % (ref 36.0–46.0)
Hemoglobin: 10.9 g/dL — ABNORMAL LOW (ref 12.0–15.0)
Immature Granulocytes: 0 %
Lymphocytes Relative: 35 %
Lymphs Abs: 1.7 10*3/uL (ref 0.7–4.0)
MCH: 28.5 pg (ref 26.0–34.0)
MCHC: 29.6 g/dL — ABNORMAL LOW (ref 30.0–36.0)
MCV: 96.3 fL (ref 80.0–100.0)
Monocytes Absolute: 0.4 10*3/uL (ref 0.1–1.0)
Monocytes Relative: 9 %
Neutro Abs: 2.7 10*3/uL (ref 1.7–7.7)
Neutrophils Relative %: 54 %
Platelets: 262 10*3/uL (ref 150–400)
RBC: 3.82 MIL/uL — ABNORMAL LOW (ref 3.87–5.11)
RDW: 13.9 % (ref 11.5–15.5)
WBC: 5 10*3/uL (ref 4.0–10.5)
nRBC: 0 % (ref 0.0–0.2)

## 2019-02-21 LAB — CMP (CANCER CENTER ONLY)
ALT: 10 U/L (ref 0–44)
AST: 14 U/L — ABNORMAL LOW (ref 15–41)
Albumin: 3 g/dL — ABNORMAL LOW (ref 3.5–5.0)
Alkaline Phosphatase: 84 U/L (ref 38–126)
Anion gap: 8 (ref 5–15)
BUN: 15 mg/dL (ref 6–20)
CO2: 26 mmol/L (ref 22–32)
Calcium: 8.9 mg/dL (ref 8.9–10.3)
Chloride: 110 mmol/L (ref 98–111)
Creatinine: 0.93 mg/dL (ref 0.44–1.00)
GFR, Est AFR Am: >60 mL/min (ref >60)
GFR, Estimated: >60 mL/min (ref >60)
Glucose, Bld: 81 mg/dL (ref 70–99)
Potassium: 4.3 mmol/L (ref 3.5–5.1)
Sodium: 144 mmol/L (ref 135–145)
Total Bilirubin: 0.3 mg/dL (ref 0.3–1.2)
Total Protein: 7.4 g/dL (ref 6.5–8.1)

## 2019-02-21 MED ORDER — LEUCOVORIN CALCIUM INJECTION 350 MG
400.0000 mg/m2 | Freq: Once | INTRAVENOUS | Status: AC
  Administered 2019-02-21: 14:00:00 1156 mg via INTRAVENOUS
  Filled 2019-02-21: qty 57.8

## 2019-02-21 MED ORDER — OXALIPLATIN CHEMO INJECTION 100 MG/20ML
86.0000 mg/m2 | Freq: Once | INTRAVENOUS | Status: AC
  Administered 2019-02-21: 14:00:00 250 mg via INTRAVENOUS
  Filled 2019-02-21: qty 50

## 2019-02-21 MED ORDER — DEXAMETHASONE SODIUM PHOSPHATE 10 MG/ML IJ SOLN
INTRAMUSCULAR | Status: AC
  Filled 2019-02-21: qty 1

## 2019-02-21 MED ORDER — SODIUM CHLORIDE 0.9 % IV SOLN
2400.0000 mg/m2 | INTRAVENOUS | Status: DC
  Administered 2019-02-21: 6950 mg via INTRAVENOUS
  Filled 2019-02-21: qty 139

## 2019-02-21 MED ORDER — DEXAMETHASONE SODIUM PHOSPHATE 10 MG/ML IJ SOLN
10.0000 mg | Freq: Once | INTRAMUSCULAR | Status: AC
  Administered 2019-02-21: 10 mg via INTRAVENOUS

## 2019-02-21 MED ORDER — DEXTROSE 5 % IV SOLN
Freq: Once | INTRAVENOUS | Status: AC
  Administered 2019-02-21: 14:00:00 via INTRAVENOUS
  Filled 2019-02-21: qty 250

## 2019-02-21 MED ORDER — SODIUM CHLORIDE 0.9 % IV SOLN
5.0000 mg/kg | Freq: Once | INTRAVENOUS | Status: AC
  Administered 2019-02-21: 14:00:00 900 mg via INTRAVENOUS
  Filled 2019-02-21: qty 32

## 2019-02-21 MED ORDER — PALONOSETRON HCL INJECTION 0.25 MG/5ML
0.2500 mg | Freq: Once | INTRAVENOUS | Status: AC
  Administered 2019-02-21: 13:00:00 0.25 mg via INTRAVENOUS

## 2019-02-21 MED ORDER — PALONOSETRON HCL INJECTION 0.25 MG/5ML
INTRAVENOUS | Status: AC
  Filled 2019-02-21: qty 5

## 2019-02-21 MED ORDER — SODIUM CHLORIDE 0.9% FLUSH
10.0000 mL | Freq: Once | INTRAVENOUS | Status: AC
  Administered 2019-02-21: 10 mL
  Filled 2019-02-21: qty 10

## 2019-02-21 MED ORDER — SODIUM CHLORIDE 0.9 % IV SOLN
Freq: Once | INTRAVENOUS | Status: AC
  Administered 2019-02-21: 13:00:00 via INTRAVENOUS
  Filled 2019-02-21: qty 250

## 2019-02-21 NOTE — Telephone Encounter (Signed)
Scheduled appt per 6/8 los. °

## 2019-02-21 NOTE — Patient Instructions (Signed)
[facility] Discharge Instructions for Patients Receiving Chemotherapy  Today you received the following chemotherapy agents Oxaliplatin (ELOXATIN), Leucovorin & Flourouracil (ADRUCIL) & Avastin   To help prevent nausea and vomiting after your treatment, we encourage you to take your nausea medication as prescribed.   If you develop nausea and vomiting that is not controlled by your nausea medication, call the clinic.   BELOW ARE SYMPTOMS THAT SHOULD BE REPORTED IMMEDIATELY:  *FEVER GREATER THAN 100.5 F  *CHILLS WITH OR WITHOUT FEVER  NAUSEA AND VOMITING THAT IS NOT CONTROLLED WITH YOUR NAUSEA MEDICATION  *UNUSUAL SHORTNESS OF BREATH  *UNUSUAL BRUISING OR BLEEDING  TENDERNESS IN MOUTH AND THROAT WITH OR WITHOUT PRESENCE OF ULCERS  *URINARY PROBLEMS  *BOWEL PROBLEMS  UNUSUAL RASH Items with * indicate a potential emergency and should be followed up as soon as possible.  Feel free to call the clinic should you have any questions or concerns. The clinic phone number is [phone].  Please show the [place] at check-in to the Emergency Department and triage nurse.  Coronavirus (COVID-19) Are you at risk?  Are you at risk for the Coronavirus (COVID-19)?  To be considered HIGH RISK for Coronavirus (COVID-19), you have to meet the following criteria:  . Traveled to [place], [place], [place], [place] or [place]; or in the [place] to [place], [place], [place], or [place]; and have fever, cough, and shortness of breath within the last 2 weeks of travel OR . Been in close contact with a person diagnosed with COVID-19 within the last 2 weeks and have fever, cough, and shortness of breath . IF YOU DO NOT MEET THESE CRITERIA, YOU ARE CONSIDERED LOW RISK FOR COVID-19.  What to do if you are HIGH RISK for COVID-19?  Marland Kitchen If you are having a medical emergency, call 911. . Seek medical care right away. Before you go to a doctor's office, urgent care or  emergency department, call ahead and tell them about your recent travel, contact with someone diagnosed with COVID-19, and your symptoms. You should receive instructions from your physician's office regarding next steps of care.  . When you arrive at healthcare provider, tell the healthcare staff immediately you have returned from visiting [place], [place], [place], [place] or [place]; or traveled in the [place] to [place], [place], [place], or [place]; in the last two weeks or you have been in close contact with a person diagnosed with COVID-19 in the last 2 weeks.   . Tell the health care staff about your symptoms: fever, cough and shortness of breath. . After you have been seen by a medical provider, you will be either: o Tested for (COVID-19) and discharged home on quarantine except to seek medical care if symptoms worsen, and asked to  - Stay home and avoid contact with others until you get your results (4-5 days)  - Avoid travel on public transportation if possible (such as bus, train, or airplane) or o Sent to the Emergency Department by EMS for evaluation, COVID-19 testing, and possible admission depending on your condition and test results.  What to do if you are LOW RISK for COVID-19?  Reduce your risk of any infection by using the same precautions used for avoiding the common cold or flu:  Marland Kitchen Wash your hands often with soap and warm water for at least 20 seconds.  If soap and water are not readily available, use an alcohol-based hand sanitizer with at least  60% alcohol.  . If coughing or sneezing, cover your mouth and nose by coughing or sneezing into the elbow areas of your shirt or coat, into a tissue or into your sleeve (not your hands). . Avoid shaking hands with others and consider head nods or verbal greetings only. . Avoid touching your eyes, nose, or mouth with unwashed hands.  . Avoid close contact with people who are sick. . Avoid places or events with large numbers  of people in one location, like concerts or sporting events. . Carefully consider travel plans you have or are making. . If you are planning any travel outside or inside the [place], visit the CDC's Travelers' Health webpage for the latest health notices. . If you have some symptoms but not all symptoms, continue to monitor at home and seek medical attention if your symptoms worsen. . If you are having a medical emergency, call 911.   [place] / e-Visit: [email or web address]         [facility]: [facility]: [phone]                   [facility]: [phone]

## 2019-02-21 NOTE — Progress Notes (Signed)
Ok to proceed without urine sample per Dr. Irene Limbo

## 2019-02-21 NOTE — Patient Instructions (Signed)

## 2019-02-21 NOTE — Progress Notes (Signed)
PT takes a pump home there was an injection appt on 6/10 made Amy S, RN charge nurse aware just so she could make sure there was a stop appt.

## 2019-02-22 ENCOUNTER — Encounter: Payer: Self-pay | Admitting: Hematology

## 2019-02-22 ENCOUNTER — Telehealth: Payer: Self-pay | Admitting: *Deleted

## 2019-02-22 ENCOUNTER — Telehealth: Payer: Self-pay | Admitting: Internal Medicine

## 2019-02-22 ENCOUNTER — Telehealth: Payer: Self-pay | Admitting: Hematology

## 2019-02-22 MED ORDER — TRAMADOL HCL 50 MG PO TABS: 50.0000 mg | ORAL_TABLET | Freq: Four times a day (QID) | ORAL | 0 refills | Status: DC | PRN

## 2019-02-22 NOTE — Telephone Encounter (Signed)
Test

## 2019-02-22 NOTE — Telephone Encounter (Signed)
Spoke with Patient for transportation assistance on 6/11 and 6/22.

## 2019-02-22 NOTE — Telephone Encounter (Signed)
Attempted to contact patient.  Per Dr. Irene Limbo, Avastin can cause h/a in 20-40% of patients, so yes, this could be a side effect. She can take tylenol for it and if not effective, can take Tramadol. Encouraged to stay hydrated and drink fluids. Patient left message in My Chart that b/p today at home 162/94. Information given to Dr. Irene Limbo.

## 2019-02-22 NOTE — Progress Notes (Signed)
BP increased from baseline post avastin (see flowsheets for details).  Dr. Irene Limbo informed.  No further instructions.

## 2019-02-22 NOTE — Telephone Encounter (Signed)
Patient called - Has a really bad headache. Started last night, pain 3/10, but his morning, pain 7/10 and won't let up. Has taken BP med, Decadron and Marinol, but did not want to take any pain medicine until she let Dr. Irene Limbo know. She started new chemotherapy yesterday and wondered if it was a side effect. Informed her that message with information will be given to Dr. Irene Limbo.

## 2019-02-23 ENCOUNTER — Other Ambulatory Visit: Payer: Self-pay

## 2019-02-23 ENCOUNTER — Inpatient Hospital Stay: Payer: Medicaid Other

## 2019-02-23 VITALS — BP 138/78 | HR 72 | Temp 98.2°F | Resp 17

## 2019-02-23 DIAGNOSIS — Z5112 Encounter for antineoplastic immunotherapy: Secondary | ICD-10-CM | POA: Diagnosis not present

## 2019-02-23 DIAGNOSIS — Z7189 Other specified counseling: Secondary | ICD-10-CM

## 2019-02-23 DIAGNOSIS — C189 Malignant neoplasm of colon, unspecified: Secondary | ICD-10-CM

## 2019-02-23 MED ORDER — SODIUM CHLORIDE 0.9% FLUSH
10.0000 mL | INTRAVENOUS | Status: DC | PRN
  Administered 2019-02-23: 10 mL
  Filled 2019-02-23: qty 10

## 2019-02-23 MED ORDER — PEGFILGRASTIM INJECTION 6 MG/0.6ML ~~LOC~~
PREFILLED_SYRINGE | SUBCUTANEOUS | Status: AC
  Filled 2019-02-23: qty 0.6

## 2019-02-23 MED ORDER — HEPARIN SOD (PORK) LOCK FLUSH 100 UNIT/ML IV SOLN
500.0000 [IU] | Freq: Once | INTRAVENOUS | Status: AC | PRN
  Administered 2019-02-23: 500 [IU]
  Filled 2019-02-23: qty 5

## 2019-02-23 MED ORDER — PEGFILGRASTIM INJECTION 6 MG/0.6ML ~~LOC~~: 6.0000 mg | PREFILLED_SYRINGE | Freq: Once | SUBCUTANEOUS | Status: DC

## 2019-02-23 NOTE — Progress Notes (Signed)
Pt returned today to have her chemo infusion pump removed and a neulasta injection per treatment plan. Pt refused the Neulasta injection. She stated " it makes me feel horrible and I stuff to do the next couple days and cant be down". This RN charted not given on the medication and notified  Dr. Irene Limbo.

## 2019-02-23 NOTE — Patient Instructions (Signed)
Pegfilgrastim injection  What is this medicine?  PEGFILGRASTIM (PEG fil gra stim) is a long-acting granulocyte colony-stimulating factor that stimulates the growth of neutrophils, a type of white blood cell important in the body's fight against infection. It is used to reduce the incidence of fever and infection in patients with certain types of cancer who are receiving chemotherapy that affects the bone marrow, and to increase survival after being exposed to high doses of radiation.  This medicine may be used for other purposes; ask your health care provider or pharmacist if you have questions.  COMMON BRAND NAME(S): Fulphila, Neulasta, UDENYCA  What should I tell my health care provider before I take this medicine?  They need to know if you have any of these conditions:  -kidney disease  -latex allergy  -ongoing radiation therapy  -sickle cell disease  -skin reactions to acrylic adhesives (On-Body Injector only)  -an unusual or allergic reaction to pegfilgrastim, filgrastim, other medicines, foods, dyes, or preservatives  -pregnant or trying to get pregnant  -breast-feeding  How should I use this medicine?  This medicine is for injection under the skin. If you get this medicine at home, you will be taught how to prepare and give the pre-filled syringe or how to use the On-body Injector. Refer to the patient Instructions for Use for detailed instructions. Use exactly as directed. Tell your healthcare provider immediately if you suspect that the On-body Injector may not have performed as intended or if you suspect the use of the On-body Injector resulted in a missed or partial dose.  It is important that you put your used needles and syringes in a special sharps container. Do not put them in a trash can. If you do not have a sharps container, call your pharmacist or healthcare provider to get one.  Talk to your pediatrician regarding the use of this medicine in children. While this drug may be prescribed for  selected conditions, precautions do apply.  Overdosage: If you think you have taken too much of this medicine contact a poison control center or emergency room at once.  NOTE: This medicine is only for you. Do not share this medicine with others.  What if I miss a dose?  It is important not to miss your dose. Call your doctor or health care professional if you miss your dose. If you miss a dose due to an On-body Injector failure or leakage, a new dose should be administered as soon as possible using a single prefilled syringe for manual use.  What may interact with this medicine?  Interactions have not been studied.  Give your health care provider a list of all the medicines, herbs, non-prescription drugs, or dietary supplements you use. Also tell them if you smoke, drink alcohol, or use illegal drugs. Some items may interact with your medicine.  This list may not describe all possible interactions. Give your health care provider a list of all the medicines, herbs, non-prescription drugs, or dietary supplements you use. Also tell them if you smoke, drink alcohol, or use illegal drugs. Some items may interact with your medicine.  What should I watch for while using this medicine?  You may need blood work done while you are taking this medicine.  If you are going to need a MRI, CT scan, or other procedure, tell your doctor that you are using this medicine (On-Body Injector only).  What side effects may I notice from receiving this medicine?  Side effects that you should report to   your doctor or health care professional as soon as possible:  -allergic reactions like skin rash, itching or hives, swelling of the face, lips, or tongue  -back pain  -dizziness  -fever  -pain, redness, or irritation at site where injected  -pinpoint red spots on the skin  -red or dark-brown urine  -shortness of breath or breathing problems  -stomach or side pain, or pain at the shoulder  -swelling  -tiredness  -trouble passing urine or  change in the amount of urine  Side effects that usually do not require medical attention (report to your doctor or health care professional if they continue or are bothersome):  -bone pain  -muscle pain  This list may not describe all possible side effects. Call your doctor for medical advice about side effects. You may report side effects to FDA at 1-800-FDA-1088.  Where should I keep my medicine?  Keep out of the reach of children.  If you are using this medicine at home, you will be instructed on how to store it. Throw away any unused medicine after the expiration date on the label.  NOTE: This sheet is a summary. It may not cover all possible information. If you have questions about this medicine, talk to your doctor, pharmacist, or health care provider.   2019 [employer] (2017-12-07 16:57:08)

## 2019-02-24 ENCOUNTER — Telehealth: Payer: Self-pay | Admitting: *Deleted

## 2019-02-24 ENCOUNTER — Ambulatory Visit: Payer: Medicaid Other | Admitting: Allergy & Immunology

## 2019-02-24 DIAGNOSIS — C189 Malignant neoplasm of colon, unspecified: Secondary | ICD-10-CM

## 2019-02-24 DIAGNOSIS — Z7189 Other specified counseling: Secondary | ICD-10-CM

## 2019-02-24 MED ORDER — ONDANSETRON HCL 8 MG PO TABS: 8.0000 mg | ORAL_TABLET | Freq: Two times a day (BID) | ORAL | 0 refills | Status: DC | PRN

## 2019-02-24 NOTE — Telephone Encounter (Signed)
Patient states has been nauseated and vomiting 2-3 times a day since Monday. States compazine is not working and she threw out the Zofran when she did not take it. Per Dr. Irene Limbo - send refill of Zofran to pharmacy. Advise patient if she becomes febrile or unable to tolerate any food/fluids she should contact office.

## 2019-02-28 ENCOUNTER — Encounter: Payer: Self-pay | Admitting: Allergy & Immunology

## 2019-03-01 ENCOUNTER — Ambulatory Visit (HOSPITAL_COMMUNITY): Payer: Medicaid Other

## 2019-03-01 ENCOUNTER — Encounter (HOSPITAL_COMMUNITY): Payer: Self-pay

## 2019-03-03 ENCOUNTER — Telehealth: Payer: Self-pay | Admitting: *Deleted

## 2019-03-03 NOTE — Telephone Encounter (Signed)
Attempted to contact patient to inform that MRI of Right knee not approved by insurance, so will not be scheduled at this time. Dr.Kale will discuss options with patient at next OV.  LVM with this information and encouraged patient to contact office for further concerns.

## 2019-03-04 NOTE — Progress Notes (Signed)
HEMATOLOGY/ONCOLOGY CLINIC NOTE  Date of Service: 03/07/2019  Patient Care Team: Nolene Ebbs, MD as PCP - General (Internal Medicine) Jackelyn Knife, MD as Rounding Team (Internal Medicine)  CHIEF COMPLAINTS/PURPOSE OF CONSULTATION:  Recently diagnosed Metastatic Adenocarcinoma of colon  HISTORY OF PRESENTING ILLNESS:   Margaret [name] is a wonderful 48 y.o. female who has been referred to [place] by Dr. Nolene Ebbs for evaluation and management of Iron deficiency anemia. The pt reports that she is doing well overall.   The pt recently presented to the ED on 04/16/18 for right sided abdominal pain that was evaluated with a CT A/P which revealed liver and pancreatic cyst, recommended for outpatient MRI follow up. The pt was found to have a UTI, was treated with Rocephin and discharged with Keflex. Prior to this the pt was admitted on 04/09/18 for symptomatic anemia, presenting with HGB at 6.2, received 2 units PRBCs, one IV Iron infusion, and was encouraged to seek out GI as outpatient.  She has an appointment with [facility] on 05/19/18.   The pt reports that she is still feeling dizzy and light headed and denies feeling better after her recent blood transfusion. She notes that she dizziness presents with a feeling of warmness and that she begins hyperventilating, feeling nervous about her dizziness.  She notes that prior to her recent hospital stay, she never required a blood transfusion or IV iron replacement. She notes that as a child she had frequent nose bleeds, and was diagnosed with anemia. She also notes a history of ice picca.   She had a hysterectomy in 2012, and prior to this she had very heavy periods that began at age 73 and occurred almost constantly. She was on Depo and Mirena which did not help slow menstrual losses. Besides taking iron supplements while pregnant she has not taken PO iron replacement as she reports not tolerating it very well while pregnant.   She denies  concern for black stools or blood in the stools and has not had a colonoscopy or endoscopy before. The pt notes that she has not previously had a concern for stomach ulcers, but has acid reflux and takes Prevacid as needed for 7-8 years. She notes that she has not recently used Prevacid, for the last 3 months.  She is not taking vitamin replacements and denies any dietary restrictions.  The pt notes that she has been continuing to have lower right quadrant abdominal pain that radiates across her abdomen, presents for up to 45 minutes and occurs intermittently. She notes that her abdominal pain presented on 04/13/18.   The pt notes that was taking Ibuprofen '800mg'$  BID for 8 years to address her back pain related to her herniated disk. She stopped taking this 4 months ago.   Of note prior to the patient's visit today, pt has had CT A/P completed on 04/16/18 with results revealing No acute findings in the abdomen/pelvis. Least 4 low-density hepatic lesions, some of which may represent small cysts or hemangiomas, however there is a 19 mm lesion in the left lobe of the liver that is incompletely characterized. Recommend nonemergent hepatic protocol MRI for further evaluation.Marland Kitchen Possible 11 mm pancreatic nodule rising from the tail, alternatively this may represent a splenule. This can also be assessed at time of hepatic MRI. Gallstone without gallbladder inflammation. Colonic diverticulosis without diverticulitis.   Most recent lab results (04/16/18) of CBC is as follows: all values are WNL except for HGB at 9.5, HCT at 33.6,  MCH at 23.8, MCHC at 28.3, RDW at 23.2, PLT at 435k. Ferritin 04/09/18 was low at 6 Vitamin B12 on 04/09/18 was at 196  On review of systems, pt reports light headedness, dizziness, lower back pain, intermittent abdominal pain, and denies black stools, blood in the stools, tingling or numbness in her legs or arms, vaginal bleeding, and any other symptoms.  Interval History:   Margaret [name] returns today for management, evaluation, and C8 FOLFOX treatment of her newly diagnosed Metastatic Adenocarcinoma of GI primary. The patient's last visit with [place] was on 02/21/19. The pt reports that she is doing well overall.  The pt reports that she had a headache for 3 days in the interim, was vomiting, and felt very weak. She felt pressure behind her eyes and felt an inability to concentrate mentally.She took Zofran which ended up being helpful. She had one nose bleed during that time as well. The pt prefers to hold Avastin at this time and understands that this can affect the efficacy of her treatment.  She is taking Lisinopril and HCTZ. She skipped these BP medications today and her BP is 172/64.  Lab results today (03/07/19) of CBC w/diff and CMP is as follows: all values are WNL except for WBC at 3.4k, RBC at 3.69, HGB at 10.5, HCT at 34.8, Calcium at 8.7, Albumin at 2.8, Total Bilirubin at <0.2.  On review of systems, pt reports recent headache, resolved nausea and vomiting, and denies abdominal pains, leg swelling, and any other symptoms.    MEDICAL HISTORY:  Past Medical History:  Diagnosis Date  . Allergic rhinitis   . Anxiety diagnosed in 1990  . Arthritis   . Asthma   . Dyspnea    with low blood count hx of anemia  . Eczema   . History of blood transfusion   . Hypertension   . Lower back pain   . Migraine   . Sleep apnea    uses CPAP    SURGICAL HISTORY: Past Surgical History:  Procedure Laterality Date  . ABDOMINAL HYSTERECTOMY    . cortisone injections     knees bilat and back   . IR IMAGING GUIDED PORT INSERTION  09/14/2018  . iron infusion    . LIVER BIOPSY      SOCIAL HISTORY: Social History   Socioeconomic History  . Marital status: Single    Spouse name: Not on file  . Number of children: Not on file  . Years of education: Not on file  . Highest education level: Not on file  Occupational History  . Not on file  Social Needs  . Financial  resource strain: Not on file  . Food insecurity    Worry: Not on file    Inability: Not on file  . Transportation needs    Medical: Not on file    Non-medical: Not on file  Tobacco Use  . Smoking status: Former Smoker    Quit date: 07/30/1999    Years since quitting: 19.6  . Smokeless tobacco: Never Used  Substance and Sexual Activity  . Alcohol use: No  . Drug use: No  . Sexual activity: Not on file  Lifestyle  . Physical activity    Days per week: Not on file    Minutes per session: Not on file  . Stress: Not on file  Relationships  . Social Herbalist on phone: Not on file    Gets together: Not on file  Attends religious service: Not on file    Active member of club or organization: Not on file    Attends meetings of clubs or organizations: Not on file    Relationship status: Not on file  . Intimate partner violence    Fear of current or ex partner: Not on file    Emotionally abused: Not on file    Physically abused: Not on file    Forced sexual activity: Not on file  Other Topics Concern  . Not on file  Social History Narrative  . Not on file    FAMILY HISTORY: No family history on file.  ALLERGIES:  is allergic to latex; tape; and tylenol with codeine #3 [acetaminophen-codeine].  MEDICATIONS:  Current Outpatient Medications  Medication Sig Dispense Refill  . albuterol (PROAIR HFA) 108 (90 Base) MCG/ACT inhaler INHALE TWO puffs BY MOUTH into the lungs EVERY 4 HOURS AS NEEDED FOR WHEEZING OR shortness of breath 18 g 1  . amoxicillin-clavulanate (AUGMENTIN) 875-125 MG tablet Take 1 tablet by mouth every 12 (twelve) hours. (Patient not taking: Reported on 12/30/2018) 10 tablet 0  . Boric Acid 4 % SOLN Place 1 capsule vaginally every Friday.     . cetirizine (ZYRTEC) 10 MG tablet Take 1 tablet (10 mg total) by mouth daily. 30 tablet 6  . clobetasol ointment (TEMOVATE) 1.28 % Apply 1 application topically 2 (two) times daily. Use for Lichen Sclerosis     . Cyanocobalamin (B-12) 1000 MCG SUBL Place 1,000 mcg under the tongue daily. (Patient not taking: Reported on 12/30/2018) 30 each 3  . dexamethasone (DECADRON) 4 MG tablet Take 2 tablets (8 mg total) by mouth daily. Start the day after chemotherapy for 2 days. Take with food. 30 tablet 1  . diazepam (VALIUM) 5 MG tablet Take 1 tablet (5 mg total) by mouth daily as needed for anxiety (panic attacks). 30 tablet 0  . diclofenac sodium (VOLTAREN) 1 % GEL Apply 2 g topically 4 (four) times daily as needed (for back/knee pain.).     Marland Kitchen dicyclomine (BENTYL) 20 MG tablet Take 1 tablet (20 mg total) by mouth 2 (two) times daily. (Patient taking differently: Take 20 mg by mouth 3 (three) times daily before meals. ) 20 tablet 0  . dronabinol (MARINOL) 5 MG capsule Take 1 capsule (5 mg total) by mouth 2 (two) times daily before a meal. 60 capsule 0  . ergocalciferol (VITAMIN D2) 50000 UNITS capsule Take 50,000 Units by mouth every Monday.     . lidocaine-prilocaine (EMLA) cream Apply to affected area once 30 g 3  . lisinopril-hydrochlorothiazide (PRINZIDE,ZESTORETIC) 20-12.5 MG tablet Take 1 tablet by mouth daily.    . methocarbamol (ROBAXIN) 750 MG tablet Take 750 mg by mouth every 8 (eight) hours as needed (back spasms.).     Marland Kitchen montelukast (SINGULAIR) 10 MG tablet Take 1 tablet (10 mg total) by mouth at bedtime. 30 tablet 6  . nystatin (NYSTATIN) powder Apply 1 g topically 2 (two) times daily as needed (irritation). Use in skin folds and under breast     . nystatin-triamcinolone (MYCOLOG II) cream Apply 1 application topically 2 (two) times daily as needed (irritation). Use in skin folds and under breast     . ondansetron (ZOFRAN) 8 MG tablet Take 1 tablet (8 mg total) by mouth 2 (two) times daily as needed for refractory nausea / vomiting. Start on day 3 after chemotherapy. 30 tablet 0  . pantoprazole (PROTONIX) 40 MG tablet Take 40 mg by mouth  daily before breakfast.   1  . PATADAY 0.2 % SOLN Apply 2 drops to  eye 2 (two) times daily as needed (for irritated eyes.). 2.5 mL 5  . pimecrolimus (ELIDEL) 1 % cream Apply 1 application topically 2 (two) times daily. 30 g 6  . prochlorperazine (COMPAZINE) 10 MG tablet Take 1 tablet (10 mg total) by mouth every 6 (six) hours as needed (Nausea or vomiting). 30 tablet 1  . saccharomyces boulardii (FLORASTOR) 250 MG capsule Take 1 capsule (250 mg total) by mouth 2 (two) times daily.    . SYMBICORT 160-4.5 MCG/ACT inhaler Inhale 2 puffs into the lungs 2 (two) times daily. (Patient taking differently: Inhale 2 puffs into the lungs 2 (two) times daily. ) 10.2 g 3  . traMADol (ULTRAM) 50 MG tablet Take 1-2 tablets (50-100 mg total) by mouth every 6 (six) hours as needed for moderate pain or severe pain. 90 tablet 0  . triamcinolone (NASACORT) 55 MCG/ACT AERO nasal inhaler Place 2 sprays into the nose daily as needed (for allergies). 1 Inhaler 3  . valACYclovir (VALTREX) 500 MG tablet Take 500 mg by mouth 2 (two) times daily as needed (for cold sores).     . vitamin B-12 1000 MCG tablet Take 1 tablet (1,000 mcg total) by mouth daily.     No current facility-administered medications for this visit.     REVIEW OF SYSTEMS:    A 10+ POINT REVIEW OF SYSTEMS WAS OBTAINED including neurology, dermatology, psychiatry, cardiac, respiratory, lymph, extremities, GI, GU, Musculoskeletal, constitutional, breasts, reproductive, HEENT.  All pertinent positives are noted in the HPI.  All others are negative.   PHYSICAL EXAMINATION:   GENERAL:alert, in no acute distress and comfortable SKIN: no acute rashes, no significant lesions EYES: conjunctiva are pink and non-injected, sclera anicteric OROPHARYNX: MMM, no exudates, no oropharyngeal erythema or ulceration NECK: supple, no JVD LYMPH:  no palpable lymphadenopathy in the cervical, axillary or inguinal regions LUNGS: clear to auscultation b/l with normal respiratory effort HEART: regular rate & rhythm ABDOMEN:  normoactive  bowel sounds , non tender, not distended. No palpable hepatosplenomegaly.  Extremity: no pedal edema PSYCH: alert & oriented x 3 with fluent speech NEURO: no focal motor/sensory deficits   LABORATORY DATA:  I have reviewed the data as listed  . CBC Latest Ref Rng & Units 03/07/2019 02/21/2019 01/17/2019  WBC 4.0 - 10.5 K/uL 3.4(L) 5.0 5.4  Hemoglobin 12.0 - 15.0 g/dL 10.5(L) 10.9(L) 10.5(L)  Hematocrit 36.0 - 46.0 % 34.8(L) 36.8 35.3(L)  Platelets 150 - 400 K/uL 218 262 294   ANC 900 . CMP Latest Ref Rng & Units 03/07/2019 02/21/2019 01/17/2019  Glucose 70 - 99 mg/dL 91 81 90  BUN 6 - 20 mg/dL '15 15 11  '$ Creatinine 0.44 - 1.00 mg/dL 0.89 0.93 0.84  Sodium 135 - 145 mmol/L 143 144 144  Potassium 3.5 - 5.1 mmol/L 4.4 4.3 4.4  Chloride 98 - 111 mmol/L 109 110 109  CO2 22 - 32 mmol/L '25 26 27  '$ Calcium 8.9 - 10.3 mg/dL 8.7(L) 8.9 8.7(L)  Total Protein 6.5 - 8.1 g/dL 7.2 7.4 7.3  Total Bilirubin 0.3 - 1.2 mg/dL <0.2(L) 0.3 0.3  Alkaline Phos 38 - 126 U/L 87 84 87  AST 15 - 41 U/L 21 14(L) 18  ALT 0 - 44 U/L '17 10 12   '$ . Lab Results  Component Value Date   IRON 24 (L) 09/06/2018   TIBC 156 (L) 09/06/2018   IRONPCTSAT 15 (L)  09/06/2018   (Iron and TIBC)  Lab Results  Component Value Date   FERRITIN 540 (H) 11/03/2018   B12  - 196--> 584  07/22/18 Liver Biopsy:   08/27/18 Repeat Liver Biopsy:   Molecular Pathology:     RADIOGRAPHIC STUDIES: I have personally reviewed the radiological images as listed and agreed with the findings in the report. No results found.  ASSESSMENT & PLAN:   48 y.o. female with  1. Iron Deficiency Anemia - ? Previous heavy periods vs GI losses  Recent heavy NSAID use ? Ulcer  2. B12 deficiency Labs upon initial presentation from 04/16/18, HGB at 9.5. The 04/09/18 Ferritin was low at 6 and Vitamin B12 was at 196 -No antiparietal antibodies, no intrinsic factor antibodies, normal TSH all from 04/28/18  PLAN: -Continue NSAID avoidance   -continue 109mg Vitamin B12 sublingually daily - B12 improved from 196 to 584  3.MetastaticAdenocarcinoma of cecum with liver mets. KRAS mutated.  04/16/18 CT A/P with pt revealed No acute findings in the abdomen/pelvis. Least 4 low-density hepatic lesions, some of which may represent small cysts or hemangiomas, however there is a 19 mm lesion in the left lobe of the liver that is incompletely characterized. Recommend nonemergent hepatic protocol MRI for further evaluation..Marland KitchenPossible 11 mm pancreatic nodule rising from the tail, alternatively this may represent a splenule. This can also be assessed at time of hepatic MRI. Gallstone without gallbladder inflammation. Colonic diverticulosis without diverticulitis.   07/09/18 Tumor marker work up: CEA normal at 1.32, AFP normal at 1.5, LDH normal at 138, CA 125 normal at 5.9, CA 19-9 normal at <2   07/22/18 Liver biopsy results revealed benign hepatic tissue, and was not diagnostic   07/16/18 PET/CT revealedThere is a solid hypermetabolic mass involving the cecum, worrisome for colonic primary neoplasm. Correlation withcolonoscopy Findings. 2. Multiple hypermetabolic liver lesions compatible with metastaticdisease. 3. Large low-density lesion in right lobe of thyroid gland. Advise further evaluation with thyroid sonography. 4. Aortic Atherosclerosis. 5. Small hiatal hernia.   08/27/18 Liver biopsy revealed Metastatic adenocarcinoma, consistent with gastrointestinal primary 08/27/18 Molecular pathology- KRAS mutation positive   09/03/18 ECHO revealed an LV EF of 624-82% mild diastolic dysfunction; mild RVE; dilated PA; mild TR; moderate to severe pulmonary hypertension  09/17/18 CEA at 2.39, which was the last CEA pre-treatment value   12/13/18 CT A/P revealed Interval response to therapy. There is been decrease in size of multifocal liver metastasis. No new or progressive findings. 2. Hiatal hernia.   Did postpone C6 by one week due to  concern for mild infection.   Did postpone C7 by two weeks for infection  4. S/p Neutropenia  PLAN: -Discussed pt labwork today, 03/07/19; blood counts and chemistries are stable. -Pt prefers to hold Avastin at this time for a concern for tolerance. Discussed possible alternatives including controlling BP better and optimizing nausea control medications and discussed that this can reduce treatment efficacy. -The pt has no prohibitive toxicities from continuing C8 FOLFOX but will hold Avastin at this time per pt preference and concern for tolerance. -Discussed that I recommend G-CSF support as well, discussed the indication, intention, and role for this medication in reducing possible treatment delays due to neutropenia. Pt understands this but prefers not to have G-CSF support at this time. -Advised that if pt develops fevers or worse symptoms, she should present to the ED as she may require IV antibiotics -Holding 5FU bolus due to neurologic symptoms and previous neutropenia, which is improved after beginning Neulasta -  Ordering Tramadol for leg pains and neulasta related bone pains -Discussed that there is not urgency from my perspective for the pt to have a colonoscopy at this time -Discussed again that pt should avoid cold objects and fluids during treatment with FOLFOX, which she has done.  -Given neurotoxicity from Oxaloplatin - grade 1-2 --- will give oxaloplatin over 4 hours instead of 2hours -Recommend saline sprays OTC to prevent further epistaxis  -Recommend soft foods that are easy to digest  -Recommend salt and baking soda mouthwashes -Recommended that the pt continue to eat well, drink at least 48-64 oz of water each day, and walk 20-30 minutes each day. -patient refused neulasta shot - will monitor -Will see the pt back with C9   Holding Avastin Plz schedule next FOLFOX with labs and MD visit   All of the patients questions were answered with apparent satisfaction. The  patient knows to call the clinic with any problems, questions or concerns.  The total time spent in the appt was 25 minutes and more than 50% was on counseling and direct patient cares.    Sullivan Lone MD MS AAHIVMS [facility] Hematology/Oncology [facility]  (Office):       [phone] (Work cell):  [phone] (Fax):           [phone]  03/07/2019 10:53 AM  I, Baldwin [place], am acting as a scribe for Dr. Sullivan Lone.   .I have reviewed the above documentation for accuracy and completeness, and I agree with the above. Brunetta Genera MD

## 2019-03-07 ENCOUNTER — Inpatient Hospital Stay (HOSPITAL_BASED_OUTPATIENT_CLINIC_OR_DEPARTMENT_OTHER): Payer: Medicaid Other | Admitting: Hematology

## 2019-03-07 ENCOUNTER — Telehealth: Payer: Self-pay | Admitting: Hematology

## 2019-03-07 ENCOUNTER — Inpatient Hospital Stay: Payer: Medicaid Other

## 2019-03-07 ENCOUNTER — Other Ambulatory Visit: Payer: Self-pay

## 2019-03-07 VITALS — BP 172/64 | HR 89 | Temp 98.5°F | Ht 70.0 in | Wt 391.3 lb

## 2019-03-07 DIAGNOSIS — D709 Neutropenia, unspecified: Secondary | ICD-10-CM

## 2019-03-07 DIAGNOSIS — Z7189 Other specified counseling: Secondary | ICD-10-CM

## 2019-03-07 DIAGNOSIS — C180 Malignant neoplasm of cecum: Secondary | ICD-10-CM | POA: Diagnosis not present

## 2019-03-07 DIAGNOSIS — D509 Iron deficiency anemia, unspecified: Secondary | ICD-10-CM

## 2019-03-07 DIAGNOSIS — M25561 Pain in right knee: Secondary | ICD-10-CM

## 2019-03-07 DIAGNOSIS — Z5111 Encounter for antineoplastic chemotherapy: Secondary | ICD-10-CM

## 2019-03-07 DIAGNOSIS — C189 Malignant neoplasm of colon, unspecified: Secondary | ICD-10-CM

## 2019-03-07 DIAGNOSIS — I700 Atherosclerosis of aorta: Secondary | ICD-10-CM

## 2019-03-07 DIAGNOSIS — R064 Hyperventilation: Secondary | ICD-10-CM

## 2019-03-07 DIAGNOSIS — C787 Secondary malignant neoplasm of liver and intrahepatic bile duct: Secondary | ICD-10-CM

## 2019-03-07 DIAGNOSIS — Z95828 Presence of other vascular implants and grafts: Secondary | ICD-10-CM

## 2019-03-07 DIAGNOSIS — E538 Deficiency of other specified B group vitamins: Secondary | ICD-10-CM

## 2019-03-07 DIAGNOSIS — K449 Diaphragmatic hernia without obstruction or gangrene: Secondary | ICD-10-CM

## 2019-03-07 DIAGNOSIS — M545 Low back pain: Secondary | ICD-10-CM

## 2019-03-07 DIAGNOSIS — K219 Gastro-esophageal reflux disease without esophagitis: Secondary | ICD-10-CM

## 2019-03-07 DIAGNOSIS — Z90710 Acquired absence of both cervix and uterus: Secondary | ICD-10-CM

## 2019-03-07 DIAGNOSIS — Z87891 Personal history of nicotine dependence: Secondary | ICD-10-CM

## 2019-03-07 DIAGNOSIS — R1031 Right lower quadrant pain: Secondary | ICD-10-CM

## 2019-03-07 DIAGNOSIS — Z5112 Encounter for antineoplastic immunotherapy: Secondary | ICD-10-CM | POA: Diagnosis not present

## 2019-03-07 DIAGNOSIS — Z79899 Other long term (current) drug therapy: Secondary | ICD-10-CM

## 2019-03-07 DIAGNOSIS — R040 Epistaxis: Secondary | ICD-10-CM

## 2019-03-07 DIAGNOSIS — R42 Dizziness and giddiness: Secondary | ICD-10-CM

## 2019-03-07 LAB — CBC WITH DIFFERENTIAL/PLATELET
Abs Immature Granulocytes: 0.01 10*3/uL (ref 0.00–0.07)
Basophils Absolute: 0 10*3/uL (ref 0.0–0.1)
Basophils Relative: 0 %
Eosinophils Absolute: 0.1 10*3/uL (ref 0.0–0.5)
Eosinophils Relative: 2 %
HCT: 34.8 % — ABNORMAL LOW (ref 36.0–46.0)
Hemoglobin: 10.5 g/dL — ABNORMAL LOW (ref 12.0–15.0)
Immature Granulocytes: 0 %
Lymphocytes Relative: 37 %
Lymphs Abs: 1.2 10*3/uL (ref 0.7–4.0)
MCH: 28.5 pg (ref 26.0–34.0)
MCHC: 30.2 g/dL (ref 30.0–36.0)
MCV: 94.3 fL (ref 80.0–100.0)
Monocytes Absolute: 0.4 10*3/uL (ref 0.1–1.0)
Monocytes Relative: 11 %
Neutro Abs: 1.7 10*3/uL (ref 1.7–7.7)
Neutrophils Relative %: 50 %
Platelets: 218 10*3/uL (ref 150–400)
RBC: 3.69 MIL/uL — ABNORMAL LOW (ref 3.87–5.11)
RDW: 13.3 % (ref 11.5–15.5)
WBC: 3.4 10*3/uL — ABNORMAL LOW (ref 4.0–10.5)
nRBC: 0 % (ref 0.0–0.2)

## 2019-03-07 LAB — CMP (CANCER CENTER ONLY)
ALT: 17 U/L (ref 0–44)
AST: 21 U/L (ref 15–41)
Albumin: 2.8 g/dL — ABNORMAL LOW (ref 3.5–5.0)
Alkaline Phosphatase: 87 U/L (ref 38–126)
Anion gap: 9 (ref 5–15)
BUN: 15 mg/dL (ref 6–20)
CO2: 25 mmol/L (ref 22–32)
Calcium: 8.7 mg/dL — ABNORMAL LOW (ref 8.9–10.3)
Chloride: 109 mmol/L (ref 98–111)
Creatinine: 0.89 mg/dL (ref 0.44–1.00)
GFR, Est AFR Am: >60 mL/min (ref >60)
GFR, Estimated: >60 mL/min (ref >60)
Glucose, Bld: 91 mg/dL (ref 70–99)
Potassium: 4.4 mmol/L (ref 3.5–5.1)
Sodium: 143 mmol/L (ref 135–145)
Total Bilirubin: <0.2 mg/dL — ABNORMAL LOW (ref 0.3–1.2)
Total Protein: 7.2 g/dL (ref 6.5–8.1)

## 2019-03-07 MED ORDER — SODIUM CHLORIDE 0.9% FLUSH
10.0000 mL | Freq: Once | INTRAVENOUS | Status: AC
  Administered 2019-03-07: 10 mL
  Filled 2019-03-07: qty 10

## 2019-03-07 MED ORDER — OXALIPLATIN CHEMO INJECTION 100 MG/20ML
250.0000 mg | Freq: Once | INTRAVENOUS | Status: AC
  Administered 2019-03-07: 250 mg via INTRAVENOUS
  Filled 2019-03-07: qty 40

## 2019-03-07 MED ORDER — OXALIPLATIN CHEMO INJECTION 100 MG/20ML: 85.0000 mg/m2 | Freq: Once | INTRAVENOUS | Status: DC

## 2019-03-07 MED ORDER — LORAZEPAM 2 MG/ML IJ SOLN
INTRAMUSCULAR | Status: AC
  Filled 2019-03-07: qty 1

## 2019-03-07 MED ORDER — DEXTROSE 5 % IV SOLN
Freq: Once | INTRAVENOUS | Status: AC
  Administered 2019-03-07: 11:00:00 via INTRAVENOUS
  Filled 2019-03-07: qty 250

## 2019-03-07 MED ORDER — PALONOSETRON HCL INJECTION 0.25 MG/5ML
0.2500 mg | Freq: Once | INTRAVENOUS | Status: AC
  Administered 2019-03-07: 0.25 mg via INTRAVENOUS

## 2019-03-07 MED ORDER — LORAZEPAM 2 MG/ML IJ SOLN
0.5000 mg | Freq: Once | INTRAMUSCULAR | Status: AC
  Administered 2019-03-07: 0.5 mg via INTRAVENOUS

## 2019-03-07 MED ORDER — DEXAMETHASONE SODIUM PHOSPHATE 10 MG/ML IJ SOLN
10.0000 mg | Freq: Once | INTRAMUSCULAR | Status: AC
  Administered 2019-03-07: 10 mg via INTRAVENOUS

## 2019-03-07 MED ORDER — DEXAMETHASONE SODIUM PHOSPHATE 10 MG/ML IJ SOLN
INTRAMUSCULAR | Status: AC
  Filled 2019-03-07: qty 1

## 2019-03-07 MED ORDER — SODIUM CHLORIDE 0.9 % IV SOLN
2400.0000 mg/m2 | INTRAVENOUS | Status: DC
  Administered 2019-03-07: 6950 mg via INTRAVENOUS
  Filled 2019-03-07: qty 139

## 2019-03-07 MED ORDER — LEUCOVORIN CALCIUM INJECTION 350 MG
400.0000 mg/m2 | Freq: Once | INTRAVENOUS | Status: AC
  Administered 2019-03-07: 1156 mg via INTRAVENOUS
  Filled 2019-03-07: qty 57.8

## 2019-03-07 MED ORDER — PALONOSETRON HCL INJECTION 0.25 MG/5ML
INTRAVENOUS | Status: AC
  Filled 2019-03-07: qty 5

## 2019-03-07 NOTE — Patient Instructions (Signed)
[place] Discharge Instructions for Patients Receiving Chemotherapy  Today you received the following chemotherapy agents Oxaliplatin (ELOXATIN), Leucovorin & Flourouracil (ADRUCIL) & Avastin   To help prevent nausea and vomiting after your treatment, we encourage you to take your nausea medication as prescribed.   If you develop nausea and vomiting that is not controlled by your nausea medication, call the clinic.   BELOW ARE SYMPTOMS THAT SHOULD BE REPORTED IMMEDIATELY:  *FEVER GREATER THAN 100.5 F  *CHILLS WITH OR WITHOUT FEVER  NAUSEA AND VOMITING THAT IS NOT CONTROLLED WITH YOUR NAUSEA MEDICATION  *UNUSUAL SHORTNESS OF BREATH  *UNUSUAL BRUISING OR BLEEDING  TENDERNESS IN MOUTH AND THROAT WITH OR WITHOUT PRESENCE OF ULCERS  *URINARY PROBLEMS  *BOWEL PROBLEMS  UNUSUAL RASH Items with * indicate a potential emergency and should be followed up as soon as possible.  Feel free to call the clinic should you have any questions or concerns. The clinic phone number is [phone].  Please show the [place] at check-in to the Emergency Department and triage nurse.  Coronavirus (COVID-19) Are you at risk?  Are you at risk for the Coronavirus (COVID-19)?  To be considered HIGH RISK for Coronavirus (COVID-19), you have to meet the following criteria:  . Traveled to [place], [place], [place], [place] or [place]; or in the [place] to [place], [place], [place], or [place]; and have fever, cough, and shortness of breath within the last 2 weeks of travel OR . Been in close contact with a person diagnosed with COVID-19 within the last 2 weeks and have fever, cough, and shortness of breath . IF YOU DO NOT MEET THESE CRITERIA, YOU ARE CONSIDERED LOW RISK FOR COVID-19.  What to do if you are HIGH RISK for COVID-19?  Marland Kitchen If you are having a medical emergency, call 911. . Seek medical care right away. Before you go to a doctor's office, urgent care or  emergency department, call ahead and tell them about your recent travel, contact with someone diagnosed with COVID-19, and your symptoms. You should receive instructions from your physician's office regarding next steps of care.  . When you arrive at healthcare provider, tell the healthcare staff immediately you have returned from visiting [place], [place], [place], [place] or [place]; or traveled in the [place] to [place], [place], [place], or [place]; in the last two weeks or you have been in close contact with a person diagnosed with COVID-19 in the last 2 weeks.   . Tell the health care staff about your symptoms: fever, cough and shortness of breath. . After you have been seen by a medical provider, you will be either: o Tested for (COVID-19) and discharged home on quarantine except to seek medical care if symptoms worsen, and asked to  - Stay home and avoid contact with others until you get your results (4-5 days)  - Avoid travel on public transportation if possible (such as bus, train, or airplane) or o Sent to the Emergency Department by EMS for evaluation, COVID-19 testing, and possible admission depending on your condition and test results.  What to do if you are LOW RISK for COVID-19?  Reduce your risk of any infection by using the same precautions used for avoiding the common cold or flu:  Marland Kitchen Wash your hands often with soap and warm water for at least 20 seconds.  If soap and water are not readily available, use an alcohol-based hand sanitizer with at least  60% alcohol.  . If coughing or sneezing, cover your mouth and nose by coughing or sneezing into the elbow areas of your shirt or coat, into a tissue or into your sleeve (not your hands). . Avoid shaking hands with others and consider head nods or verbal greetings only. . Avoid touching your eyes, nose, or mouth with unwashed hands.  . Avoid close contact with people who are sick. . Avoid places or events with large numbers  of people in one location, like concerts or sporting events. . Carefully consider travel plans you have or are making. . If you are planning any travel outside or inside the [place], visit the CDC's Travelers' Health webpage for the latest health notices. . If you have some symptoms but not all symptoms, continue to monitor at home and seek medical attention if your symptoms worsen. . If you are having a medical emergency, call 911.   [place] / e-Visit: [email or web address]         [facility]: [facility]: [phone]                   [facility]: [phone]

## 2019-03-07 NOTE — Telephone Encounter (Signed)
Scheduled appt per 6/22 los. °

## 2019-03-09 ENCOUNTER — Inpatient Hospital Stay: Payer: Medicaid Other

## 2019-03-09 ENCOUNTER — Other Ambulatory Visit: Payer: Self-pay

## 2019-03-09 VITALS — BP 140/78 | HR 82 | Temp 98.0°F | Resp 18

## 2019-03-09 DIAGNOSIS — Z5112 Encounter for antineoplastic immunotherapy: Secondary | ICD-10-CM | POA: Diagnosis not present

## 2019-03-09 DIAGNOSIS — C189 Malignant neoplasm of colon, unspecified: Secondary | ICD-10-CM

## 2019-03-09 DIAGNOSIS — Z7189 Other specified counseling: Secondary | ICD-10-CM

## 2019-03-09 MED ORDER — HEPARIN SOD (PORK) LOCK FLUSH 100 UNIT/ML IV SOLN
500.0000 [IU] | Freq: Once | INTRAVENOUS | Status: AC | PRN
  Administered 2019-03-09: 500 [IU]
  Filled 2019-03-09: qty 5

## 2019-03-09 MED ORDER — SODIUM CHLORIDE 0.9% FLUSH
10.0000 mL | INTRAVENOUS | Status: DC | PRN
  Administered 2019-03-09: 10 mL
  Filled 2019-03-09: qty 10

## 2019-03-10 ENCOUNTER — Telehealth (INDEPENDENT_AMBULATORY_CARE_PROVIDER_SITE_OTHER): Payer: Medicaid Other | Admitting: Allergy & Immunology

## 2019-03-10 ENCOUNTER — Encounter: Payer: Self-pay | Admitting: Allergy & Immunology

## 2019-03-10 DIAGNOSIS — J3089 Other allergic rhinitis: Secondary | ICD-10-CM | POA: Diagnosis not present

## 2019-03-10 DIAGNOSIS — J4540 Moderate persistent asthma, uncomplicated: Secondary | ICD-10-CM

## 2019-03-10 DIAGNOSIS — J302 Other seasonal allergic rhinitis: Secondary | ICD-10-CM | POA: Diagnosis not present

## 2019-03-10 DIAGNOSIS — L2089 Other atopic dermatitis: Secondary | ICD-10-CM

## 2019-03-10 MED ORDER — TRIAMCINOLONE ACETONIDE 55 MCG/ACT NA AERO: 2.0000 | INHALATION_SPRAY | Freq: Every day | NASAL | 5 refills | Status: AC | PRN

## 2019-03-10 MED ORDER — MONTELUKAST SODIUM 10 MG PO TABS: 10.0000 mg | ORAL_TABLET | Freq: Every day | ORAL | 6 refills | Status: AC

## 2019-03-10 MED ORDER — ALBUTEROL SULFATE HFA 108 (90 BASE) MCG/ACT IN AERS: INHALATION_SPRAY | RESPIRATORY_TRACT | 1 refills | Status: DC

## 2019-03-10 MED ORDER — BUDESONIDE-FORMOTEROL FUMARATE 160-4.5 MCG/ACT IN AERO: 2.0000 | INHALATION_SPRAY | Freq: Two times a day (BID) | RESPIRATORY_TRACT | 5 refills | Status: DC

## 2019-03-10 MED ORDER — PANTOPRAZOLE SODIUM 40 MG PO TBEC: 40.0000 mg | DELAYED_RELEASE_TABLET | Freq: Every day | ORAL | 1 refills | Status: AC

## 2019-03-10 MED ORDER — CETIRIZINE HCL 10 MG PO TABS: 10.0000 mg | ORAL_TABLET | Freq: Every day | ORAL | 5 refills | Status: AC

## 2019-03-10 MED ORDER — PATADAY 0.2 % OP SOLN: 2.0000 [drp] | Freq: Two times a day (BID) | OPHTHALMIC | 5 refills | Status: DC | PRN

## 2019-03-10 MED ORDER — NYSTATIN 100000 UNIT/GM EX POWD: Freq: Two times a day (BID) | CUTANEOUS | 5 refills | Status: AC | PRN

## 2019-03-10 NOTE — Patient Instructions (Addendum)
1. Moderate persistent asthma, uncomplicated - We will send in refills for all of her medications. - Daily controller medication(s): Singulair 10mg  daily and Symbicort 80/4.25mcg two puffs twice daily with spacer - Prior to physical activity: ProAir 2 puffs 10-15 minutes before physical activity. - Rescue medications: ProAir 4 puffs every 4-6 hours as needed - Asthma control goals:  * Full participation in all desired activities (may need albuterol before activity) * Albuterol use two time or less a week on average (not counting use with activity) * Cough interfering with sleep two time or less a month * Oral steroids no more than once a year * No hospitalizations  2. Seasonal and perennial allergic rhinitis - Continue with Nasacort 1-2 sprays per nostril daily. - Continue with Zyrtec 10mg  daily.   3. Eczema - Continue with Elidel as needed.  - Continue with moisturizing twice daily.  - Continue with nystatin twice daily as needed for fungal flares.   4. Return in about 6 months (around 09/09/2019). This can be an in-person, a virtual Webex or a telephone follow up visit.   Please inform us of any Emergency Department visits, hospitalizations, or changes in symptoms. Call us before going to the ED for breathing or allergy symptoms since we might be able to fit you in for a sick visit. Feel free to contact us anytime with any questions, problems, or concerns.  It was a pleasure to talk to you today today!  Websites that have reliable patient information: 1. American Academy of Asthma, Allergy, and Immunology: www.aaaai.org 2. Food Allergy Research and Education (FARE): foodallergy.org 3. Mothers of Asthmatics: http://www.asthmacommunitynetwork.org 4. American College of Allergy, Asthma, and Immunology: www.acaai.org  "Like" [place] on Facebook and Instagram for our latest updates!      Make sure you are registered to vote! If you have moved or changed any of your contact information,  you will need to get this updated before voting!  In some cases, you MAY be able to register to vote online: [email or web address]    Voter ID laws are NOT going into effect for the General Election in November 2020! DO NOT let this stop you from exercising your right to vote!   Absentee voting is the SAFEST way to vote during the coronavirus pandemic!   Download and print an absentee ballot request form at [email or web address] or you can scan the QR code below with your smart phone:      More information on absentee ballots can be found here: [email or web address]

## 2019-03-10 NOTE — Progress Notes (Signed)
RE: [name] MRN: [id number] DOB: 09/22/1970 Date of Telemedicine Visit: 03/10/2019  Referring provider: Nolene Ebbs, MD Primary care provider: Nolene Ebbs, MD  Chief Complaint: Asthma and Allergic Rhinitis    Telemedicine Follow Up Visit via Telephone: I connected with Margaret [name] for a follow up on 03/10/19 by telephone and verified that I am speaking with the correct person using two identifiers.   I discussed the limitations, risks, security and privacy concerns of performing an evaluation and management service by telephone and the availability of in person appointments. I also discussed with the patient that there may be a patient responsible charge related to this service. The patient expressed understanding and agreed to proceed.  Patient is at home.  Provider is at the office.  Visit start time: 1:27 PM Visit end time: 1:50 PM Insurance consent/check in by: Lelan Pons Medical consent and medical assistant/nurse: Garlon Hatchet  History of Present Illness:  She is a 48 y.o. female, who is being followed for persistent asthma as well as allergic rhinitis. Her previous allergy office visit was in October 2019 with Dr. Ernst Bowler.  At the last visit, she was endorsing shortness of breath.  Her lung testing looked great.  We recommended that she go to the ER for an evaluation due to this shortness of breath.  We continued Singulair and Symbicort 2 puffs twice daily.  For her allergic rhinitis we continue with Nasacort and Zyrtec.  In the interim, she has been diagnosed with stage IV colon cancer. She went through liver biopsies. She had some bleeding issues and was diagnosed with pneumonia. She is undergoing her 8th cycle of chemotherapy. This workup was started after the ED visit following our last visit. She was having SOB and passing out in her own home. Despite all of this, she remains fairly upbeat.   Asthma/Respiratory Symptom History: She has had SOB secondary to all  of her chemotherapy. She remains on the Symbicort 160/4.5 mcg two puffs twice daily. She has not needed steroids for her breathing at all. She feels that her current SOB is secondary to chemotherapy.   Allergic Rhinitis Symptom History: Allergy symptoms are well controlled. She did start having some sinus pressure a few days ago. She thought she had a cold and endorses continued rhinorrhea and sneezing. She has been taking her cetirizine.    Her eczema has been well controlled.  She does need a refill on her Elidel.  She has been using her moisturizing twice daily.  Otherwise, there have been no changes to her past medical history, surgical history, family history, or social history.  Assessment and Plan:  [name] is a 48 y.o. female with:  Moderate persistent asthma, uncomplicated  Seasonal and perennial allergic rhinitis  Eczema  Stage IV colon cancer with metastases     1. Moderate persistent asthma, uncomplicated - We will send in refills.  - I do think that you need your flu shot this year, but you need to get it at your primary care provider since you have Medicaid. - Daily controller medication(s): Singulair 10mg  daily and Symbicort 80/4.20mcg two puffs twice daily with spacer - Prior to physical activity: ProAir 2 puffs 10-15 minutes before physical activity. - Rescue medications: ProAir 4 puffs every 4-6 hours as needed - Asthma control goals:  * Full participation in all desired activities (may need albuterol before activity) * Albuterol use two time or less a week on average (not counting use with activity) * Cough interfering with sleep two  time or less a month * Oral steroids no more than once a year * No hospitalizations  2. Seasonal and perennial allergic rhinitis - Continue with Nasacort 1-2 sprays per nostril daily. - Continue with Zyrtec 10mg  daily.   3. Eczema - Continue with Elidel as needed.  - Continue with moisturizing twice daily.  - Continue with  nystatin twice daily as needed for fungal flares.   4. Return in about 6 months (around 09/09/2019). This can be an in-person, a virtual Webex or a telephone follow up visit.    Diagnostics: None.  Medication List:  Current Outpatient Medications  Medication Sig Dispense Refill  . albuterol (PROAIR HFA) 108 (90 Base) MCG/ACT inhaler INHALE TWO puffs BY MOUTH into the lungs EVERY 4 HOURS AS NEEDED FOR WHEEZING OR shortness of breath 18 g 1  . Boric Acid 4 % SOLN Place 1 capsule vaginally every Friday.     . budesonide-formoterol (SYMBICORT) 160-4.5 MCG/ACT inhaler Inhale 2 puffs into the lungs 2 (two) times a day. 10.2 g 5  . cetirizine (ZYRTEC) 10 MG tablet Take 1 tablet (10 mg total) by mouth daily. 30 tablet 5  . clobetasol ointment (TEMOVATE) 3.71 % Apply 1 application topically 2 (two) times daily. Use for Lichen Sclerosis    . Cyanocobalamin (B-12) 1000 MCG SUBL Place 1,000 mcg under the tongue daily. 30 each 3  . dexamethasone (DECADRON) 4 MG tablet Take 2 tablets (8 mg total) by mouth daily. Start the day after chemotherapy for 2 days. Take with food. 30 tablet 1  . diazepam (VALIUM) 5 MG tablet Take 1 tablet (5 mg total) by mouth daily as needed for anxiety (panic attacks). 30 tablet 0  . diclofenac sodium (VOLTAREN) 1 % GEL Apply 2 g topically 4 (four) times daily as needed (for back/knee pain.).     Marland Kitchen dicyclomine (BENTYL) 20 MG tablet Take 1 tablet (20 mg total) by mouth 2 (two) times daily. (Patient taking differently: Take 20 mg by mouth 3 (three) times daily before meals. ) 20 tablet 0  . dronabinol (MARINOL) 5 MG capsule Take 1 capsule (5 mg total) by mouth 2 (two) times daily before a meal. 60 capsule 0  . ergocalciferol (VITAMIN D2) 50000 UNITS capsule Take 50,000 Units by mouth every Monday.     . lidocaine-prilocaine (EMLA) cream Apply to affected area once 30 g 3  . lisinopril-hydrochlorothiazide (PRINZIDE,ZESTORETIC) 20-12.5 MG tablet Take 1 tablet by mouth daily.    .  methocarbamol (ROBAXIN) 750 MG tablet Take 750 mg by mouth every 8 (eight) hours as needed (back spasms.).     Marland Kitchen montelukast (SINGULAIR) 10 MG tablet Take 1 tablet (10 mg total) by mouth at bedtime. 30 tablet 6  . nystatin-triamcinolone (MYCOLOG II) cream Apply 1 application topically 2 (two) times daily as needed (irritation). Use in skin folds and under breast     . ondansetron (ZOFRAN) 8 MG tablet Take 1 tablet (8 mg total) by mouth 2 (two) times daily as needed for refractory nausea / vomiting. Start on day 3 after chemotherapy. 30 tablet 0  . pantoprazole (PROTONIX) 40 MG tablet Take 1 tablet (40 mg total) by mouth daily before breakfast. 30 tablet 1  . PATADAY 0.2 % SOLN Apply 2 drops to eye 2 (two) times daily as needed (for irritated eyes.). 2.5 mL 5  . pimecrolimus (ELIDEL) 1 % cream Apply 1 application topically 2 (two) times daily. 30 g 6  . prochlorperazine (COMPAZINE) 10 MG tablet Take 1  tablet (10 mg total) by mouth every 6 (six) hours as needed (Nausea or vomiting). 30 tablet 1  . saccharomyces boulardii (FLORASTOR) 250 MG capsule Take 1 capsule (250 mg total) by mouth 2 (two) times daily.    . traMADol (ULTRAM) 50 MG tablet Take 1-2 tablets (50-100 mg total) by mouth every 6 (six) hours as needed for moderate pain or severe pain. 90 tablet 0  . triamcinolone (NASACORT) 55 MCG/ACT AERO nasal inhaler Place 2 sprays into the nose daily as needed (for allergies). 1 Inhaler 5  . valACYclovir (VALTREX) 500 MG tablet Take 500 mg by mouth 2 (two) times daily as needed (for cold sores).     . nystatin (MYCOSTATIN/NYSTOP) powder Apply topically 2 (two) times daily as needed for up to 30 days. 30 g 5   No current facility-administered medications for this visit.    Allergies: Allergies  Allergen Reactions  . Latex Hives and Shortness Of Breath  . Tape Rash  . Tylenol With Codeine #3 [Acetaminophen-Codeine] Itching and Rash   I reviewed her past medical history, social history, family  history, and environmental history and no significant changes have been reported from previous visits.  Review of Systems  Constitutional: Negative for activity change, appetite change, chills, fatigue and fever.  HENT: Negative for congestion, postnasal drip, rhinorrhea, sinus pressure, sneezing and sore throat.   Eyes: Negative for pain, discharge, redness and itching.  Respiratory: Positive for cough and shortness of breath. Negative for wheezing and stridor.   Gastrointestinal: Negative for diarrhea, nausea and vomiting.  Endocrine: Negative for cold intolerance and heat intolerance.  Musculoskeletal: Negative for arthralgias, joint swelling and myalgias.  Skin: Negative for rash.  Allergic/Immunologic: Negative for environmental allergies, food allergies and immunocompromised state.    Objective:  Physical exam not obtained as encounter was done via telephone.   Previous notes and tests were reviewed.  I discussed the assessment and treatment plan with the patient. The patient was provided an opportunity to ask questions and all were answered. The patient agreed with the plan and demonstrated an understanding of the instructions.   The patient was advised to call back or seek an in-person evaluation if the symptoms worsen or if the condition fails to improve as anticipated.  I provided 23 minutes of non-face-to-face time during this encounter.  It was my pleasure to participate in [place] care today. Please feel free to contact me with any questions or concerns.   Sincerely,  Valentina Shaggy, MD

## 2019-03-11 ENCOUNTER — Telehealth: Payer: Self-pay

## 2019-03-11 MED ORDER — PAZEO 0.7 % OP SOLN: 1.0000 [drp] | Freq: Every day | OPHTHALMIC | 5 refills | Status: AC

## 2019-03-11 NOTE — Telephone Encounter (Signed)
Pharmacy calling clinic asking if we could switch pataday to [facility].  They do not have pataday in stock.

## 2019-03-12 NOTE — Telephone Encounter (Signed)
Fine with me!   Margaret Marvel, MD [facility] [place] [facility] [place]

## 2019-03-14 NOTE — Telephone Encounter (Signed)
Patient has been informed of medication change. 

## 2019-03-14 NOTE — Telephone Encounter (Signed)
Angela Cox, RMA sent in RX for [place] on 03-11-2019.

## 2019-03-14 NOTE — Addendum Note (Signed)
Addended by: Lucrezia Starch I on: 03/14/2019 08:28 AM   Modules accepted: Orders

## 2019-03-16 ENCOUNTER — Encounter: Payer: Self-pay | Admitting: Hematology

## 2019-03-21 ENCOUNTER — Inpatient Hospital Stay: Payer: Medicaid Other

## 2019-03-21 ENCOUNTER — Inpatient Hospital Stay: Payer: Medicaid Other | Admitting: Hematology

## 2019-03-21 ENCOUNTER — Encounter: Payer: Self-pay | Admitting: Hematology

## 2019-03-23 ENCOUNTER — Encounter: Payer: Self-pay | Admitting: General Practice

## 2019-03-23 NOTE — Progress Notes (Signed)
[place] CSW Progress Notes  Called patient at request of MD to assess for needs and resources.  Patient has missed several appointments due to needs of family. Is single mother of son Recalls month "I took off when I had my infection in my knee" with no ill effect.  Feels that she needs to pay attention to son's medical care needs during August, "I will have to cut back on my treatments." Son has asthma, prediabetes, and recent allergic skin reaction.  Feels "I am faithful coming to my appointments except for when I had the fall."  Missed appointment on Monday because "transportation didn't book my ride."    States that "when I get my treatments it had me immobilized for the rest of the week. I don't have the energy to do any appointments with my son."  Has upcoming appointments on 7/29, 8/14 and another appt in early August.  Hopes to have all son's appointments scheduled by start of school on 8/17.  Relies on Medicaid transportation to transport her and son to his appointments - this requires waiting and fatigue.    Son will also have upcoming surgery and recovery from skin issues related to allergic reaction.  Wants to concentrate on son's medical and educational needs during most of month of August. Did not understand that she would be undergoing 12 cycles of chemotherapy - now understands that she has completed cycle 8 but will need to have several more cycles to complete chemotherapy.  Hopes to complete 9 and 10 on 7/20 and 8/3.  Will try to work with sons MD to compress appointments such that she can resume her treatments 3rd or 4th week of August.  However, has no information yet about son's surgery schedule - will let [facility] know as soon as possible.  MD advised to this note which describes patients needs and wishes.  Edwyna Shell, LCSW Clinical Social Worker Phone:  [phone]

## 2019-04-03 NOTE — Progress Notes (Signed)
HEMATOLOGY/ONCOLOGY CLINIC NOTE  Date of Service: 04/04/2019  Patient Care Team: Nolene Ebbs, MD as PCP - General (Internal Medicine) Jackelyn Knife, MD as Rounding Team (Internal Medicine)   CHIEF COMPLAINTS/PURPOSE OF CONSULTATION:  Recently diagnosed Metastatic Adenocarcinoma of colon   HISTORY OF PRESENTING ILLNESS:  Margaret [name] is a wonderful 48 y.o. female who has been referred to [place] by Dr. Nolene Ebbs for evaluation and management of Iron deficiency anemia. The pt reports that she is doing well overall.   The pt recently presented to the ED on 04/16/18 for right sided abdominal pain that was evaluated with a CT A/P which revealed liver and pancreatic cyst, recommended for outpatient MRI follow up. The pt was found to have a UTI, was treated with Rocephin and discharged with Keflex. Prior to this the pt was admitted on 04/09/18 for symptomatic anemia, presenting with HGB at 6.2, received 2 units PRBCs, one IV Iron infusion, and was encouraged to seek out GI as outpatient.  She has an appointment with [facility] on 05/19/18.   The pt reports that she is still feeling dizzy and light headed and denies feeling better after her recent blood transfusion. She notes that she dizziness presents with a feeling of warmness and that she begins hyperventilating, feeling nervous about her dizziness.  She notes that prior to her recent hospital stay, she never required a blood transfusion or IV iron replacement. She notes that as a child she had frequent nose bleeds, and was diagnosed with anemia. She also notes a history of ice picca.   She had a hysterectomy in 2012, and prior to this she had very heavy periods that began at age 13 and occurred almost constantly. She was on Depo and Mirena which did not help slow menstrual losses. Besides taking iron supplements while pregnant she has not taken PO iron replacement as she reports not tolerating it very well while pregnant.   She denies  concern for black stools or blood in the stools and has not had a colonoscopy or endoscopy before. The pt notes that she has not previously had a concern for stomach ulcers, but has acid reflux and takes Prevacid as needed for 7-8 years. She notes that she has not recently used Prevacid, for the last 3 months.  She is not taking vitamin replacements and denies any dietary restrictions.  The pt notes that she has been continuing to have lower right quadrant abdominal pain that radiates across her abdomen, presents for up to 45 minutes and occurs intermittently. She notes that her abdominal pain presented on 04/13/18.   The pt notes that was taking Ibuprofen 828m BID for 8 years to address her back pain related to her herniated disk. She stopped taking this 4 months ago.   Of note prior to the patient's visit today, pt has had CT A/P completed on 04/16/18 with results revealing No acute findings in the abdomen/pelvis. Least 4 low-density hepatic lesions, some of which may represent small cysts or hemangiomas, however there is a 19 mm lesion in the left lobe of the liver that is incompletely characterized. Recommend nonemergent hepatic protocol MRI for further evaluation..Marland KitchenPossible 11 mm pancreatic nodule rising from the tail, alternatively this may represent a splenule. This can also be assessed at time of hepatic MRI. Gallstone without gallbladder inflammation. Colonic diverticulosis without diverticulitis.   Most recent lab results (04/16/18) of CBC is as follows: all values are WNL except for HGB at 9.5, HCT at  33.6, MCH at 23.8, MCHC at 28.3, RDW at 23.2, PLT at 435k. Ferritin 04/09/18 was low at 6 Vitamin B12 on 04/09/18 was at 196  On review of systems, pt reports light headedness, dizziness, lower back pain, intermittent abdominal pain, and denies black stools, blood in the stools, tingling or numbness in her legs or arms, vaginal bleeding, and any other symptoms.   INTERVAL HISTORY:   Margaret [name] returns today for management, evaluation, and C8 FOLFOX treatment of her newly diagnosed Metastatic Adenocarcinoma of GI primary. The patient's last visit with [place] was on 03/07/2019. The pt reports that she is doing well overall.  She is tolerating her treatment without the Avastin much better. She will continue Folfox. 04/18/2019. Her following cycle will be postponed a week to 05/09/2019.   The pt reports that she met with the social worker on 03/23/2019 to discuss recent missed appointments due to son's health and lack of transportation. She notes that she has been having some tension headaches. Her knee is still painful and still has a burning sensation, but she notes that her swelling has reduced considerably. She is considering a fast.  Lab results today (04/04/19) of CBC w/diff and CMP is as follows: all values are WNL except for RBC at 3.80, hemoglobin at 10.6, HCT at 35.0, albumin at 2.8, and AST at 14.  On review of systems, pt reports some abdominal pain due to constipation, some shortness of breath due to heat, lack of appetite and denies fevers, chills, mouth sores, and any other symptoms.    MEDICAL HISTORY:  Past Medical History:  Diagnosis Date  . Allergic rhinitis   . Anxiety diagnosed in 1990  . Arthritis   . Asthma   . Dyspnea    with low blood count hx of anemia  . Eczema   . History of blood transfusion   . Hypertension   . Lower back pain   . Migraine   . Sleep apnea    uses CPAP    SURGICAL HISTORY: Past Surgical History:  Procedure Laterality Date  . ABDOMINAL HYSTERECTOMY    . cortisone injections     knees bilat and back   . IR IMAGING GUIDED PORT INSERTION  09/14/2018  . iron infusion    . LIVER BIOPSY      SOCIAL HISTORY: Social History   Socioeconomic History  . Marital status: Single    Spouse name: Not on file  . Number of children: Not on file  . Years of education: Not on file  . Highest education level: Not on file   Occupational History  . Not on file  Social Needs  . Financial resource strain: Not on file  . Food insecurity    Worry: Not on file    Inability: Not on file  . Transportation needs    Medical: Not on file    Non-medical: Not on file  Tobacco Use  . Smoking status: Former Smoker    Quit date: 07/30/1999    Years since quitting: 19.6  . Smokeless tobacco: Never Used  Substance and Sexual Activity  . Alcohol use: No  . Drug use: No  . Sexual activity: Not on file  Lifestyle  . Physical activity    Days per week: Not on file    Minutes per session: Not on file  . Stress: Not on file  Relationships  . Social Herbalist on phone: Not on file    Gets together:  Not on file    Attends religious service: Not on file    Active member of club or organization: Not on file    Attends meetings of clubs or organizations: Not on file    Relationship status: Not on file  . Intimate partner violence    Fear of current or ex partner: Not on file    Emotionally abused: Not on file    Physically abused: Not on file    Forced sexual activity: Not on file  Other Topics Concern  . Not on file  Social History Narrative  . Not on file    FAMILY HISTORY: History reviewed. No pertinent family history.  ALLERGIES:  is allergic to latex; tape; and tylenol with codeine #3 [acetaminophen-codeine].  MEDICATIONS:  Current Outpatient Medications  Medication Sig Dispense Refill  . albuterol (PROAIR HFA) 108 (90 Base) MCG/ACT inhaler INHALE TWO puffs BY MOUTH into the lungs EVERY 4 HOURS AS NEEDED FOR WHEEZING OR shortness of breath 18 g 1  . Boric Acid 4 % SOLN Place 1 capsule vaginally every Friday.     . budesonide-formoterol (SYMBICORT) 160-4.5 MCG/ACT inhaler Inhale 2 puffs into the lungs 2 (two) times a day. 10.2 g 5  . cetirizine (ZYRTEC) 10 MG tablet Take 1 tablet (10 mg total) by mouth daily. 30 tablet 5  . clobetasol ointment (TEMOVATE) 4.09 % Apply 1 application topically 2  (two) times daily. Use for Lichen Sclerosis    . Cyanocobalamin (B-12) 1000 MCG SUBL Place 1,000 mcg under the tongue daily. 30 each 3  . dexamethasone (DECADRON) 4 MG tablet Take 2 tablets (8 mg total) by mouth daily. Start the day after chemotherapy for 2 days. Take with food. 30 tablet 1  . diazepam (VALIUM) 5 MG tablet Take 1 tablet (5 mg total) by mouth daily as needed for anxiety (panic attacks). 30 tablet 0  . diclofenac sodium (VOLTAREN) 1 % GEL Apply 2 g topically 4 (four) times daily as needed (for back/knee pain.).     Marland Kitchen dicyclomine (BENTYL) 20 MG tablet Take 1 tablet (20 mg total) by mouth 2 (two) times daily. (Patient taking differently: Take 20 mg by mouth 3 (three) times daily before meals. ) 20 tablet 0  . dronabinol (MARINOL) 5 MG capsule Take 1 capsule (5 mg total) by mouth 2 (two) times daily before a meal. 60 capsule 0  . ergocalciferol (VITAMIN D2) 50000 UNITS capsule Take 50,000 Units by mouth every Monday.     . lidocaine-prilocaine (EMLA) cream Apply to affected area once 30 g 3  . lisinopril-hydrochlorothiazide (PRINZIDE,ZESTORETIC) 20-12.5 MG tablet Take 1 tablet by mouth daily.    . methocarbamol (ROBAXIN) 750 MG tablet Take 750 mg by mouth every 8 (eight) hours as needed (back spasms.).     Marland Kitchen montelukast (SINGULAIR) 10 MG tablet Take 1 tablet (10 mg total) by mouth at bedtime. 30 tablet 6  . nystatin (MYCOSTATIN/NYSTOP) powder Apply topically 2 (two) times daily as needed for up to 30 days. 30 g 5  . nystatin-triamcinolone (MYCOLOG II) cream Apply 1 application topically 2 (two) times daily as needed (irritation). Use in skin folds and under breast     . Olopatadine HCl (PAZEO) 0.7 % SOLN Apply 1 drop to eye daily. 2.5 mL 5  . ondansetron (ZOFRAN) 8 MG tablet Take 1 tablet (8 mg total) by mouth 2 (two) times daily as needed for refractory nausea / vomiting. Start on day 3 after chemotherapy. 30 tablet 0  . pantoprazole (  PROTONIX) 40 MG tablet Take 1 tablet (40 mg total) by  mouth daily before breakfast. 30 tablet 1  . pimecrolimus (ELIDEL) 1 % cream Apply 1 application topically 2 (two) times daily. 30 g 6  . prochlorperazine (COMPAZINE) 10 MG tablet Take 1 tablet (10 mg total) by mouth every 6 (six) hours as needed (Nausea or vomiting). 30 tablet 1  . saccharomyces boulardii (FLORASTOR) 250 MG capsule Take 1 capsule (250 mg total) by mouth 2 (two) times daily.    . traMADol (ULTRAM) 50 MG tablet Take 1-2 tablets (50-100 mg total) by mouth every 6 (six) hours as needed for moderate pain or severe pain. 90 tablet 0  . triamcinolone (NASACORT) 55 MCG/ACT AERO nasal inhaler Place 2 sprays into the nose daily as needed (for allergies). 1 Inhaler 5  . valACYclovir (VALTREX) 500 MG tablet Take 500 mg by mouth 2 (two) times daily as needed (for cold sores).      No current facility-administered medications for this visit.    Facility-Administered Medications Ordered in Other Visits  Medication Dose Route Frequency Provider Last Rate Last Dose  . fluorouracil (ADRUCIL) 6,950 mg in sodium chloride 0.9 % 111 mL chemo infusion  2,400 mg/m2 (Treatment Plan Recorded) Intravenous 1 day or 1 dose Brunetta Genera, MD      . heparin lock flush 100 unit/mL  500 Units Intracatheter Once PRN Brunetta Genera, MD      . leucovorin 1,156 mg in dextrose 5 % 250 mL infusion  400 mg/m2 (Treatment Plan Recorded) Intravenous Once Brunetta Genera, MD      . oxaliplatin (ELOXATIN) 250 mg in dextrose 5 % 1,000 mL chemo infusion  86 mg/m2 (Treatment Plan Recorded) Intravenous Once Brunetta Genera, MD      . sodium chloride flush (NS) 0.9 % injection 10 mL  10 mL Intracatheter PRN Brunetta Genera, MD        REVIEW OF SYSTEMS:   A 10+ POINT REVIEW OF SYSTEMS WAS OBTAINED including neurology, dermatology, psychiatry, cardiac, respiratory, lymph, extremities, GI, GU, Musculoskeletal, constitutional, breasts, reproductive, HEENT.  All pertinent positives are noted in the HPI.   All others are negative.    PHYSICAL EXAMINATION:  Vitals:   04/04/19 1002  BP: (!) 167/81  Pulse: 87  Resp: 18  Temp: (!) 97.1 F (36.2 C)  SpO2: 100%    Wt Readings from Last 3 Encounters:  04/04/19 (!) 393 lb 9.6 oz (178.5 kg)  03/07/19 (!) 391 lb 4.8 oz (177.5 kg)  02/21/19 (!) 390 lb 8 oz (177.1 kg)   Body mass index is 56.48 kg/m.   GENERAL:alert, in no acute distress and comfortable SKIN: no acute rashes, no significant lesions EYES: conjunctiva are pink and non-injected, sclera anicteric OROPHARYNX: MMM, no exudates, no oropharyngeal erythema or ulceration NECK: supple, no JVD LYMPH:  no palpable lymphadenopathy in the cervical, axillary or inguinal regions LUNGS: clear to auscultation b/l with normal respiratory effort HEART: regular rate & rhythm ABDOMEN:  normoactive bowel sounds , non tender, not distended. Extremity: no pedal edema PSYCH: alert & oriented x 3 with fluent speech NEURO: no focal motor/sensory deficits   LABORATORY DATA:  I have reviewed the data as listed  . CBC Latest Ref Rng & Units 04/04/2019 03/07/2019 02/21/2019  WBC 4.0 - 10.5 K/uL 6.9 3.4(L) 5.0  Hemoglobin 12.0 - 15.0 g/dL 10.6(L) 10.5(L) 10.9(L)  Hematocrit 36.0 - 46.0 % 35.0(L) 34.8(L) 36.8  Platelets 150 - 400 K/uL 233 218 262  [place] 900 . CMP Latest Ref Rng & Units 04/04/2019 03/07/2019 02/21/2019  Glucose 70 - 99 mg/dL 95 91 81  BUN 6 - 20 mg/dL _0 Creatinine 0.44 - 1.00 mg/dL 0.82 0.89 0.93  Sodium 135 - 145 mmol/L 144 143 144  Potassium 3.5 - 5.1 mmol/L 4.2 4.4 4.3  Chloride 98 - 111 mmol/L 109 109 110  CO2 22 - 32 mmol/L _1 Calcium 8.9 - 10.3 mg/dL 9.0 8.7(L) 8.9  Total Protein 6.5 - 8.1 g/dL 7.5 7.2 7.4  Total Bilirubin 0.3 - 1.2 mg/dL 0.3 <0.2(L) 0.3  Alkaline Phos 38 - 126 U/L 86 87 84  AST 15 - 41 U/L 14(L) 21 14(L)  ALT 0 - 44 U/L _2 . Lab Results  Component Value Date   IRON 24 (L) 09/06/2018   TIBC 156 (L) 09/06/2018   IRONPCTSAT 15 (L)  09/06/2018   (Iron and TIBC)  Lab Results  Component Value Date   FERRITIN 540 (H) 11/03/2018   B12  - 196--> 584  07/22/18 Liver Biopsy:   08/27/18 Repeat Liver Biopsy:   Molecular Pathology:     RADIOGRAPHIC STUDIES: I have personally reviewed the radiological images as listed and agreed with the findings in the report. No results found.  ASSESSMENT & PLAN:   48 y.o. female with  1. Iron Deficiency Anemia - ? Previous heavy periods vs GI losses  Recent heavy NSAID use ? Ulcer  2. B12 deficiency Labs upon initial presentation from 04/16/18, HGB at 9.5. The 04/09/18 Ferritin was low at 6 and Vitamin B12 was at 196 -No antiparietal antibodies, no intrinsic factor antibodies, normal TSH all from 04/28/18  PLAN: -Continue NSAID avoidance  -continue 1075mg Vitamin B12 sublingually daily - B12 improved from 196 to 584  3.MetastaticAdenocarcinoma of cecum with liver mets. KRAS mutated.  04/16/18 CT A/P with pt revealed No acute findings in the abdomen/pelvis. Least 4 low-density hepatic lesions, some of which may represent small cysts or hemangiomas, however there is a 19 mm lesion in the left lobe of the liver that is incompletely characterized. Recommend nonemergent hepatic protocol MRI for further evaluation..Marland KitchenPossible 11 mm pancreatic nodule rising from the tail, alternatively this may represent a splenule. This can also be assessed at time of hepatic MRI. Gallstone without gallbladder inflammation. Colonic diverticulosis without diverticulitis.   07/09/18 Tumor marker work up: CEA normal at 1.32, AFP normal at 1.5, LDH normal at 138, CA 125 normal at 5.9, CA 19-9 normal at <2   07/22/18 Liver biopsy results revealed benign hepatic tissue, and was not diagnostic   07/16/18 PET/CT revealedThere is a solid hypermetabolic mass involving the cecum, worrisome for colonic primary neoplasm. Correlation withcolonoscopy Findings. 2. Multiple hypermetabolic liver lesions  compatible with metastaticdisease. 3. Large low-density lesion in right lobe of thyroid gland. Advise further evaluation with thyroid sonography. 4. Aortic Atherosclerosis. 5. Small hiatal hernia.   08/27/18 Liver biopsy revealed Metastatic adenocarcinoma, consistent with gastrointestinal primary 08/27/18 Molecular pathology- KRAS mutation positive   09/03/18 ECHO revealed an LV EF of 694-50% mild diastolic dysfunction; mild RVE; dilated PA; mild TR; moderate to severe pulmonary hypertension  09/17/18 CEA at 2.39, which was the last CEA pre-treatment value   12/13/18 CT A/P revealed Interval response to therapy. There is been decrease in size of multifocal liver metastasis. No new or progressive findings. 2. Hiatal hernia.   Did postpone C6 by one week due to concern for mild infection.  Did postpone C7 by two weeks for infection  4. S/p Neutropenia   PLAN: -Discussed pt labwork today, 04/04/19; all values are WNL except for RBC at 3.80, hemoglobin at 10.6, HCT at 35.0, albumin at 2.8, and AST at 14. -Discussed the importance of hydration, particularly during her fast. -Discussed treatment regimen and scheduling issues.  -Discussed getting a repeat staging CTs after cycle 12  -Discussed son's health and transportation issues in regards to difficulty in getting treatment.  -Discussed that she is tolerating her treatment without the Avastin much better. She will continue Folfox, with her next cycle on 04/18/2019. Her following cycle will be postponed a week to 05/09/2019.  -The pt has no prohibitive toxicities from continuing Folfox at this time.    FOLLOW UP: Followup for C10 as scheduled on 04/18/2019 with labs and MD visit Wants to delay C11 from 8/17 to 8/24 with  labs and MD visit   All of the patients questions were answered with apparent satisfaction. The patient knows to call the clinic with any problems, questions or concerns.  The total time spent in the appt was 25 minutes  and more than 50% was on counseling and direct patient cares.     Sullivan Lone MD MS AAHIVMS [facility] Hematology/Oncology Physician [facility]  (Office):       [phone] (Work cell):  [phone] (Fax):           [phone]  04/04/2019 10:52 AM  I, Jacqualyn Posey, am acting as a Education administrator for Dr. Sullivan Lone.   .I have reviewed the above documentation for accuracy and completeness, and I agree with the above. Brunetta Genera MD

## 2019-04-04 ENCOUNTER — Inpatient Hospital Stay: Payer: Medicaid Other

## 2019-04-04 ENCOUNTER — Telehealth: Payer: Self-pay | Admitting: Hematology

## 2019-04-04 ENCOUNTER — Inpatient Hospital Stay: Payer: Medicaid Other | Attending: Hematology

## 2019-04-04 ENCOUNTER — Inpatient Hospital Stay (HOSPITAL_BASED_OUTPATIENT_CLINIC_OR_DEPARTMENT_OTHER): Payer: Medicaid Other | Admitting: Hematology

## 2019-04-04 ENCOUNTER — Encounter: Payer: Self-pay | Admitting: Hematology

## 2019-04-04 ENCOUNTER — Other Ambulatory Visit: Payer: Self-pay

## 2019-04-04 VITALS — Temp 98.5°F

## 2019-04-04 VITALS — BP 167/81 | HR 87 | Temp 97.1°F | Resp 18 | Ht 70.0 in | Wt 393.6 lb

## 2019-04-04 DIAGNOSIS — R42 Dizziness and giddiness: Secondary | ICD-10-CM

## 2019-04-04 DIAGNOSIS — G44209 Tension-type headache, unspecified, not intractable: Secondary | ICD-10-CM | POA: Diagnosis not present

## 2019-04-04 DIAGNOSIS — R1031 Right lower quadrant pain: Secondary | ICD-10-CM | POA: Insufficient documentation

## 2019-04-04 DIAGNOSIS — Z90710 Acquired absence of both cervix and uterus: Secondary | ICD-10-CM

## 2019-04-04 DIAGNOSIS — C787 Secondary malignant neoplasm of liver and intrahepatic bile duct: Secondary | ICD-10-CM | POA: Diagnosis not present

## 2019-04-04 DIAGNOSIS — Z5111 Encounter for antineoplastic chemotherapy: Secondary | ICD-10-CM | POA: Diagnosis not present

## 2019-04-04 DIAGNOSIS — C180 Malignant neoplasm of cecum: Secondary | ICD-10-CM | POA: Insufficient documentation

## 2019-04-04 DIAGNOSIS — Z87891 Personal history of nicotine dependence: Secondary | ICD-10-CM | POA: Diagnosis not present

## 2019-04-04 DIAGNOSIS — E538 Deficiency of other specified B group vitamins: Secondary | ICD-10-CM | POA: Insufficient documentation

## 2019-04-04 DIAGNOSIS — D500 Iron deficiency anemia secondary to blood loss (chronic): Secondary | ICD-10-CM | POA: Diagnosis not present

## 2019-04-04 DIAGNOSIS — M545 Low back pain: Secondary | ICD-10-CM | POA: Insufficient documentation

## 2019-04-04 DIAGNOSIS — K5900 Constipation, unspecified: Secondary | ICD-10-CM

## 2019-04-04 DIAGNOSIS — D509 Iron deficiency anemia, unspecified: Secondary | ICD-10-CM

## 2019-04-04 DIAGNOSIS — R0602 Shortness of breath: Secondary | ICD-10-CM | POA: Diagnosis not present

## 2019-04-04 DIAGNOSIS — I700 Atherosclerosis of aorta: Secondary | ICD-10-CM | POA: Diagnosis not present

## 2019-04-04 DIAGNOSIS — Z79899 Other long term (current) drug therapy: Secondary | ICD-10-CM

## 2019-04-04 DIAGNOSIS — C189 Malignant neoplasm of colon, unspecified: Secondary | ICD-10-CM

## 2019-04-04 DIAGNOSIS — R630 Anorexia: Secondary | ICD-10-CM | POA: Insufficient documentation

## 2019-04-04 DIAGNOSIS — Z7189 Other specified counseling: Secondary | ICD-10-CM

## 2019-04-04 DIAGNOSIS — Z95828 Presence of other vascular implants and grafts: Secondary | ICD-10-CM

## 2019-04-04 LAB — CBC WITH DIFFERENTIAL/PLATELET
Abs Immature Granulocytes: 0.03 10*3/uL (ref 0.00–0.07)
Basophils Absolute: 0 10*3/uL (ref 0.0–0.1)
Basophils Relative: 0 %
Eosinophils Absolute: 0.1 10*3/uL (ref 0.0–0.5)
Eosinophils Relative: 2 %
HCT: 35 % — ABNORMAL LOW (ref 36.0–46.0)
Hemoglobin: 10.6 g/dL — ABNORMAL LOW (ref 12.0–15.0)
Immature Granulocytes: 0 %
Lymphocytes Relative: 31 %
Lymphs Abs: 2.1 10*3/uL (ref 0.7–4.0)
MCH: 27.9 pg (ref 26.0–34.0)
MCHC: 30.3 g/dL (ref 30.0–36.0)
MCV: 92.1 fL (ref 80.0–100.0)
Monocytes Absolute: 0.7 10*3/uL (ref 0.1–1.0)
Monocytes Relative: 10 %
Neutro Abs: 4 10*3/uL (ref 1.7–7.7)
Neutrophils Relative %: 57 %
Platelets: 233 10*3/uL (ref 150–400)
RBC: 3.8 MIL/uL — ABNORMAL LOW (ref 3.87–5.11)
RDW: 14.6 % (ref 11.5–15.5)
WBC: 6.9 10*3/uL (ref 4.0–10.5)
nRBC: 0 % (ref 0.0–0.2)

## 2019-04-04 LAB — CMP (CANCER CENTER ONLY)
ALT: 11 U/L (ref 0–44)
AST: 14 U/L — ABNORMAL LOW (ref 15–41)
Albumin: 2.8 g/dL — ABNORMAL LOW (ref 3.5–5.0)
Alkaline Phosphatase: 86 U/L (ref 38–126)
Anion gap: 9 (ref 5–15)
BUN: 15 mg/dL (ref 6–20)
CO2: 26 mmol/L (ref 22–32)
Calcium: 9 mg/dL (ref 8.9–10.3)
Chloride: 109 mmol/L (ref 98–111)
Creatinine: 0.82 mg/dL (ref 0.44–1.00)
GFR, Est AFR Am: >60 mL/min (ref >60)
GFR, Estimated: >60 mL/min (ref >60)
Glucose, Bld: 95 mg/dL (ref 70–99)
Potassium: 4.2 mmol/L (ref 3.5–5.1)
Sodium: 144 mmol/L (ref 135–145)
Total Bilirubin: 0.3 mg/dL (ref 0.3–1.2)
Total Protein: 7.5 g/dL (ref 6.5–8.1)

## 2019-04-04 MED ORDER — OXALIPLATIN CHEMO INJECTION 100 MG/20ML
86.0000 mg/m2 | Freq: Once | INTRAVENOUS | Status: AC
  Administered 2019-04-04: 250 mg via INTRAVENOUS
  Filled 2019-04-04: qty 40

## 2019-04-04 MED ORDER — LEUCOVORIN CALCIUM INJECTION 350 MG
400.0000 mg/m2 | Freq: Once | INTRAVENOUS | Status: AC
  Administered 2019-04-04: 1156 mg via INTRAVENOUS
  Filled 2019-04-04: qty 57.8

## 2019-04-04 MED ORDER — DEXAMETHASONE SODIUM PHOSPHATE 10 MG/ML IJ SOLN
INTRAMUSCULAR | Status: AC
  Filled 2019-04-04: qty 1

## 2019-04-04 MED ORDER — DEXTROSE 5 % IV SOLN
Freq: Once | INTRAVENOUS | Status: AC
  Administered 2019-04-04: 11:00:00 via INTRAVENOUS
  Filled 2019-04-04: qty 250

## 2019-04-04 MED ORDER — SODIUM CHLORIDE 0.9% FLUSH
10.0000 mL | INTRAVENOUS | Status: DC | PRN
  Filled 2019-04-04: qty 10

## 2019-04-04 MED ORDER — HEPARIN SOD (PORK) LOCK FLUSH 100 UNIT/ML IV SOLN
500.0000 [IU] | Freq: Once | INTRAVENOUS | Status: DC | PRN
  Filled 2019-04-04: qty 5

## 2019-04-04 MED ORDER — SODIUM CHLORIDE 0.9 % IV SOLN
2400.0000 mg/m2 | INTRAVENOUS | Status: DC
  Administered 2019-04-04: 6950 mg via INTRAVENOUS
  Filled 2019-04-04: qty 139

## 2019-04-04 MED ORDER — PALONOSETRON HCL INJECTION 0.25 MG/5ML
0.2500 mg | Freq: Once | INTRAVENOUS | Status: AC
  Administered 2019-04-04: 0.25 mg via INTRAVENOUS

## 2019-04-04 MED ORDER — PALONOSETRON HCL INJECTION 0.25 MG/5ML
INTRAVENOUS | Status: AC
  Filled 2019-04-04: qty 5

## 2019-04-04 MED ORDER — DEXAMETHASONE SODIUM PHOSPHATE 10 MG/ML IJ SOLN
10.0000 mg | Freq: Once | INTRAMUSCULAR | Status: AC
  Administered 2019-04-04: 10 mg via INTRAVENOUS

## 2019-04-04 MED ORDER — SODIUM CHLORIDE 0.9% FLUSH
10.0000 mL | Freq: Once | INTRAVENOUS | Status: AC
  Administered 2019-04-04: 10 mL
  Filled 2019-04-04: qty 10

## 2019-04-04 NOTE — Patient Instructions (Signed)
[place] Discharge Instructions for Patients Receiving Chemotherapy  Today you received the following chemotherapy agents: Oxaliplatin, Leucovorin, and Adrucil (5FU).  To help prevent nausea and vomiting after your treatment, we encourage you to take your nausea medication as directed.   If you develop nausea and vomiting that is not controlled by your nausea medication, call the clinic.   BELOW ARE SYMPTOMS THAT SHOULD BE REPORTED IMMEDIATELY:  *FEVER GREATER THAN 100.5 F  *CHILLS WITH OR WITHOUT FEVER  NAUSEA AND VOMITING THAT IS NOT CONTROLLED WITH YOUR NAUSEA MEDICATION  *UNUSUAL SHORTNESS OF BREATH  *UNUSUAL BRUISING OR BLEEDING  TENDERNESS IN MOUTH AND THROAT WITH OR WITHOUT PRESENCE OF ULCERS  *URINARY PROBLEMS  *BOWEL PROBLEMS  UNUSUAL RASH Items with * indicate a potential emergency and should be followed up as soon as possible.  Feel free to call the clinic should you have any questions or concerns. The clinic phone number is [phone].  Please show the [facility] at check-in to the Emergency Department and triage nurse.

## 2019-04-04 NOTE — Telephone Encounter (Signed)
Scheduled appt per 7/20 los. ° ° °

## 2019-04-05 ENCOUNTER — Encounter: Payer: Self-pay | Admitting: Hematology

## 2019-04-06 ENCOUNTER — Inpatient Hospital Stay: Payer: Medicaid Other

## 2019-04-06 ENCOUNTER — Other Ambulatory Visit: Payer: Self-pay

## 2019-04-06 VITALS — BP 158/62 | HR 82 | Temp 98.2°F | Resp 18

## 2019-04-06 DIAGNOSIS — Z7189 Other specified counseling: Secondary | ICD-10-CM

## 2019-04-06 DIAGNOSIS — C189 Malignant neoplasm of colon, unspecified: Secondary | ICD-10-CM

## 2019-04-06 DIAGNOSIS — C787 Secondary malignant neoplasm of liver and intrahepatic bile duct: Secondary | ICD-10-CM

## 2019-04-06 DIAGNOSIS — Z5111 Encounter for antineoplastic chemotherapy: Secondary | ICD-10-CM | POA: Diagnosis not present

## 2019-04-06 MED ORDER — SODIUM CHLORIDE 0.9% FLUSH
10.0000 mL | INTRAVENOUS | Status: DC | PRN
  Administered 2019-04-06: 10 mL
  Filled 2019-04-06: qty 10

## 2019-04-06 MED ORDER — HEPARIN SOD (PORK) LOCK FLUSH 100 UNIT/ML IV SOLN
500.0000 [IU] | Freq: Once | INTRAVENOUS | Status: AC | PRN
  Administered 2019-04-06: 500 [IU]
  Filled 2019-04-06: qty 5

## 2019-04-08 ENCOUNTER — Encounter: Payer: Self-pay | Admitting: Hematology

## 2019-04-13 ENCOUNTER — Encounter: Payer: Self-pay | Admitting: Hematology

## 2019-04-14 NOTE — Telephone Encounter (Signed)
Attempted to contact patient per Dr. Grier Mitts recommendation: Needs to be seen and evaluated as soon as possible. Recommend she see her PCP (or urgent Care if PCP not availble) to start addressing this immediately. Dr.Kale can evaluate on Monday to determine its status and decision to give chemotherapy. Left voice mail: Please contact office in regards to answer to question sent via [place]. Will also send message on MyChart.

## 2019-04-17 NOTE — Progress Notes (Signed)
HEMATOLOGY/ONCOLOGY CLINIC NOTE  Date of Service: 04/18/2019  Patient Care Team: Nolene Ebbs, MD as PCP - General (Internal Medicine) Jackelyn Knife, MD as Rounding Team (Internal Medicine)   CHIEF COMPLAINTS/PURPOSE OF CONSULTATION:  Recently diagnosed Metastatic Adenocarcinoma of colon   HISTORY OF PRESENTING ILLNESS:  Margaret [name] is a wonderful 48 y.o. female who has been referred to [place] by Dr. Nolene Ebbs for evaluation and management of Iron deficiency anemia. The pt reports that she is doing well overall.   The pt recently presented to the ED on 04/16/18 for right sided abdominal pain that was evaluated with a CT A/P which revealed liver and pancreatic cyst, recommended for outpatient MRI follow up. The pt was found to have a UTI, was treated with Rocephin and discharged with Keflex. Prior to this the pt was admitted on 04/09/18 for symptomatic anemia, presenting with HGB at 6.2, received 2 units PRBCs, one IV Iron infusion, and was encouraged to seek out GI as outpatient.  She has an appointment with [facility] on 05/19/18.   The pt reports that she is still feeling dizzy and light headed and denies feeling better after her recent blood transfusion. She notes that she dizziness presents with a feeling of warmness and that she begins hyperventilating, feeling nervous about her dizziness.  She notes that prior to her recent hospital stay, she never required a blood transfusion or IV iron replacement. She notes that as a child she had frequent nose bleeds, and was diagnosed with anemia. She also notes a history of ice picca.   She had a hysterectomy in 2012, and prior to this she had very heavy periods that began at age 49 and occurred almost constantly. She was on Depo and Mirena which did not help slow menstrual losses. Besides taking iron supplements while pregnant she has not taken PO iron replacement as she reports not tolerating it very well while pregnant.   She denies  concern for black stools or blood in the stools and has not had a colonoscopy or endoscopy before. The pt notes that she has not previously had a concern for stomach ulcers, but has acid reflux and takes Prevacid as needed for 7-8 years. She notes that she has not recently used Prevacid, for the last 3 months.  She is not taking vitamin replacements and denies any dietary restrictions.  The pt notes that she has been continuing to have lower right quadrant abdominal pain that radiates across her abdomen, presents for up to 45 minutes and occurs intermittently. She notes that her abdominal pain presented on 04/13/18.   The pt notes that was taking Ibuprofen '800mg'$  BID for 8 years to address her back pain related to her herniated disk. She stopped taking this 4 months ago.   Of note prior to the patient's visit today, pt has had CT A/P completed on 04/16/18 with results revealing No acute findings in the abdomen/pelvis. Least 4 low-density hepatic lesions, some of which may represent small cysts or hemangiomas, however there is a 19 mm lesion in the left lobe of the liver that is incompletely characterized. Recommend nonemergent hepatic protocol MRI for further evaluation.Marland Kitchen Possible 11 mm pancreatic nodule rising from the tail, alternatively this may represent a splenule. This can also be assessed at time of hepatic MRI. Gallstone without gallbladder inflammation. Colonic diverticulosis without diverticulitis.   Most recent lab results (04/16/18) of CBC is as follows: all values are WNL except for HGB at 9.5, HCT at  33.6, MCH at 23.8, MCHC at 28.3, RDW at 23.2, PLT at 435k. Ferritin 04/09/18 was low at 6 Vitamin B12 on 04/09/18 was at 196  On review of systems, pt reports light headedness, dizziness, lower back pain, intermittent abdominal pain, and denies black stools, blood in the stools, tingling or numbness in her legs or arms, vaginal bleeding, and any other symptoms.   INTERVAL HISTORY:   Margaret [name] returns today for management, evaluation, and C10D1 FOLFOX treatment of her newly diagnosed Metastatic Adenocarcinoma of GI primary. The patient's last visit with [place] was on 04/04/2019. The pt reports that she is doing well overall.  The pt reports that she has been experiencing some itchiness in her eyes. She thinks it is due to an allergic reaction to mushrooms. Her eyes are itchy and sore and she denies fevers. Her PCP gave her Tobradex and Pataday eye drops which have helped.  She is scheduled for an MRI next week because she fell on her knee. She has also been experiencing some nose bleeds, but she had them before she started treatment. Denies problems with her port.  Lab results today (04/18/2019) of CBC w/diff and CMP is as follows: all values are WNL except for WBC at 3.9k, RBs at 3.52, HGB at 9.8, CT at 32.3, glucose bld at 109, Albumin at 2.6. 04/18/2019 CEA is pending  On review of systems, pt reports eye itchiness and soreness, nose bleeds, knee swelling due to a fall and denies belly pain, fevers, problems with her port, and any other symptoms.   MEDICAL HISTORY:  Past Medical History:  Diagnosis Date  . Allergic rhinitis   . Anxiety diagnosed in 1990  . Arthritis   . Asthma   . Dyspnea    with low blood count hx of anemia  . Eczema   . History of blood transfusion   . Hypertension   . Lower back pain   . Migraine   . Sleep apnea    uses CPAP    SURGICAL HISTORY: Past Surgical History:  Procedure Laterality Date  . ABDOMINAL HYSTERECTOMY    . cortisone injections     knees bilat and back   . IR IMAGING GUIDED PORT INSERTION  09/14/2018  . iron infusion    . LIVER BIOPSY      SOCIAL HISTORY: Social History   Socioeconomic History  . Marital status: Single    Spouse name: Not on file  . Number of children: Not on file  . Years of education: Not on file  . Highest education level: Not on file  Occupational History  . Not on file  Social Needs  .  Financial resource strain: Not on file  . Food insecurity    Worry: Not on file    Inability: Not on file  . Transportation needs    Medical: Not on file    Non-medical: Not on file  Tobacco Use  . Smoking status: Former Smoker    Quit date: 07/30/1999    Years since quitting: 19.7  . Smokeless tobacco: Never Used  Substance and Sexual Activity  . Alcohol use: No  . Drug use: No  . Sexual activity: Not on file  Lifestyle  . Physical activity    Days per week: Not on file    Minutes per session: Not on file  . Stress: Not on file  Relationships  . Social Herbalist on phone: Not on file    Gets together:  Not on file    Attends religious service: Not on file    Active member of club or organization: Not on file    Attends meetings of clubs or organizations: Not on file    Relationship status: Not on file  . Intimate partner violence    Fear of current or ex partner: Not on file    Emotionally abused: Not on file    Physically abused: Not on file    Forced sexual activity: Not on file  Other Topics Concern  . Not on file  Social History Narrative  . Not on file    FAMILY HISTORY: No family history on file.  ALLERGIES:  is allergic to latex; tape; and tylenol with codeine #3 [acetaminophen-codeine].  MEDICATIONS:  Current Outpatient Medications  Medication Sig Dispense Refill  . albuterol (PROAIR HFA) 108 (90 Base) MCG/ACT inhaler INHALE TWO puffs BY MOUTH into the lungs EVERY 4 HOURS AS NEEDED FOR WHEEZING OR shortness of breath 18 g 1  . Boric Acid 4 % SOLN Place 1 capsule vaginally every Friday.     . budesonide-formoterol (SYMBICORT) 160-4.5 MCG/ACT inhaler Inhale 2 puffs into the lungs 2 (two) times a day. 10.2 g 5  . cetirizine (ZYRTEC) 10 MG tablet Take 1 tablet (10 mg total) by mouth daily. 30 tablet 5  . clobetasol ointment (TEMOVATE) 6.30 % Apply 1 application topically 2 (two) times daily. Use for Lichen Sclerosis    . Cyanocobalamin (B-12) 1000  MCG SUBL Place 1,000 mcg under the tongue daily. 30 each 3  . dexamethasone (DECADRON) 4 MG tablet Take 2 tablets (8 mg total) by mouth daily. Start the day after chemotherapy for 2 days. Take with food. 30 tablet 1  . diazepam (VALIUM) 5 MG tablet Take 1 tablet (5 mg total) by mouth daily as needed for anxiety (panic attacks). 30 tablet 0  . diclofenac sodium (VOLTAREN) 1 % GEL Apply 2 g topically 4 (four) times daily as needed (for back/knee pain.).     Marland Kitchen dicyclomine (BENTYL) 20 MG tablet Take 1 tablet (20 mg total) by mouth 2 (two) times daily. (Patient taking differently: Take 20 mg by mouth 3 (three) times daily before meals. ) 20 tablet 0  . dronabinol (MARINOL) 5 MG capsule Take 1 capsule (5 mg total) by mouth 2 (two) times daily before a meal. 60 capsule 0  . ergocalciferol (VITAMIN D2) 50000 UNITS capsule Take 50,000 Units by mouth every Monday.     . lidocaine-prilocaine (EMLA) cream Apply to affected area once 30 g 3  . lisinopril-hydrochlorothiazide (PRINZIDE,ZESTORETIC) 20-12.5 MG tablet Take 1 tablet by mouth daily.    . methocarbamol (ROBAXIN) 750 MG tablet Take 750 mg by mouth every 8 (eight) hours as needed (back spasms.).     Marland Kitchen montelukast (SINGULAIR) 10 MG tablet Take 1 tablet (10 mg total) by mouth at bedtime. 30 tablet 6  . nystatin-triamcinolone (MYCOLOG II) cream Apply 1 application topically 2 (two) times daily as needed (irritation). Use in skin folds and under breast     . Olopatadine HCl (PAZEO) 0.7 % SOLN Apply 1 drop to eye daily. 2.5 mL 5  . ondansetron (ZOFRAN) 8 MG tablet Take 1 tablet (8 mg total) by mouth 2 (two) times daily as needed for refractory nausea / vomiting. Start on day 3 after chemotherapy. 30 tablet 0  . pantoprazole (PROTONIX) 40 MG tablet Take 1 tablet (40 mg total) by mouth daily before breakfast. 30 tablet 1  . pimecrolimus (ELIDEL)  1 % cream Apply 1 application topically 2 (two) times daily. 30 g 6  . prochlorperazine (COMPAZINE) 10 MG tablet Take 1  tablet (10 mg total) by mouth every 6 (six) hours as needed (Nausea or vomiting). 30 tablet 1  . saccharomyces boulardii (FLORASTOR) 250 MG capsule Take 1 capsule (250 mg total) by mouth 2 (two) times daily.    . traMADol (ULTRAM) 50 MG tablet Take 1-2 tablets (50-100 mg total) by mouth every 6 (six) hours as needed for moderate pain or severe pain. 90 tablet 0  . triamcinolone (NASACORT) 55 MCG/ACT AERO nasal inhaler Place 2 sprays into the nose daily as needed (for allergies). 1 Inhaler 5  . valACYclovir (VALTREX) 500 MG tablet Take 500 mg by mouth 2 (two) times daily as needed (for cold sores).      No current facility-administered medications for this visit.     REVIEW OF SYSTEMS:    A 10+ POINT REVIEW OF SYSTEMS WAS OBTAINED including neurology, dermatology, psychiatry, cardiac, respiratory, lymph, extremities, GI, GU, Musculoskeletal, constitutional, breasts, reproductive, HEENT.  All pertinent positives are noted in the HPI.  All others are negative.   PHYSICAL EXAMINATION:  Vitals:   04/18/19 1027  BP: (!) 159/92  Pulse: 96  Resp: 18  Temp: 98.5 F (36.9 C)  SpO2: 100%    Wt Readings from Last 3 Encounters:  04/18/19 (!) 398 lb (180.5 kg)  04/04/19 (!) 393 lb 9.6 oz (178.5 kg)  03/07/19 (!) 391 lb 4.8 oz (177.5 kg)   Body mass index is 57.11 kg/m.   GENERAL:alert, in no acute distress and comfortable SKIN: no acute rashes, no significant lesions EYES: conjunctiva are pink and non-injected, sclera anicteric OROPHARYNX: MMM, no exudates, no oropharyngeal erythema or ulceration NECK: supple, no JVD LYMPH:  no palpable lymphadenopathy in the cervical, axillary or inguinal regions LUNGS: clear to auscultation b/l with normal respiratory effort HEART: regular rate & rhythm ABDOMEN:  normoactive bowel sounds , non tender, not distended. No palpable hepatosplenomegaly.  Extremity: no pedal edema PSYCH: alert & oriented x 3 with fluent speech NEURO: no focal motor/sensory  deficits    LABORATORY DATA:  I have reviewed the data as listed  . CBC Latest Ref Rng & Units 04/18/2019 04/04/2019 03/07/2019  WBC 4.0 - 10.5 K/uL 3.9(L) 6.9 3.4(L)  Hemoglobin 12.0 - 15.0 g/dL 9.8(L) 10.6(L) 10.5(L)  Hematocrit 36.0 - 46.0 % 32.3(L) 35.0(L) 34.8(L)  Platelets 150 - 400 K/uL 202 233 218   ANC 900 . CMP Latest Ref Rng & Units 04/18/2019 04/04/2019 03/07/2019  Glucose 70 - 99 mg/dL 109(H) 95 91  BUN 6 - 20 mg/dL '15 15 15  '$ Creatinine 0.44 - 1.00 mg/dL 0.86 0.82 0.89  Sodium 135 - 145 mmol/L 143 144 143  Potassium 3.5 - 5.1 mmol/L 4.2 4.2 4.4  Chloride 98 - 111 mmol/L 109 109 109  CO2 22 - 32 mmol/L '26 26 25  '$ Calcium 8.9 - 10.3 mg/dL 9.0 9.0 8.7(L)  Total Protein 6.5 - 8.1 g/dL 6.9 7.5 7.2  Total Bilirubin 0.3 - 1.2 mg/dL 0.3 0.3 <0.2(L)  Alkaline Phos 38 - 126 U/L 85 86 87  AST 15 - 41 U/L 15 14(L) 21  ALT 0 - 44 U/L '13 11 17   '$ . Lab Results  Component Value Date   IRON 24 (L) 09/06/2018   TIBC 156 (L) 09/06/2018   IRONPCTSAT 15 (L) 09/06/2018   (Iron and TIBC)  Lab Results  Component Value Date   FERRITIN  540 (H) 11/03/2018   B12  - 196--> 584  07/22/18 Liver Biopsy:   08/27/18 Repeat Liver Biopsy:   Molecular Pathology:     RADIOGRAPHIC STUDIES: I have personally reviewed the radiological images as listed and agreed with the findings in the report. No results found.  ASSESSMENT & PLAN:   48 y.o. female with  1. Iron Deficiency Anemia - ? Previous heavy periods vs GI losses  Recent heavy NSAID use ? Ulcer  2. B12 deficiency Labs upon initial presentation from 04/16/18, HGB at 9.5. The 04/09/18 Ferritin was low at 6 and Vitamin B12 was at 196 -No antiparietal antibodies, no intrinsic factor antibodies, normal TSH all from 04/28/18  PLAN: -Continue NSAID avoidance  -continue 1048mg Vitamin B12 sublingually daily - B12 improved from 196 to 584  3.MetastaticAdenocarcinoma of cecum with liver mets. KRAS mutated.  04/16/18 CT A/P with pt  revealed No acute findings in the abdomen/pelvis. Least 4 low-density hepatic lesions, some of which may represent small cysts or hemangiomas, however there is a 19 mm lesion in the left lobe of the liver that is incompletely characterized. Recommend nonemergent hepatic protocol MRI for further evaluation..Marland KitchenPossible 11 mm pancreatic nodule rising from the tail, alternatively this may represent a splenule. This can also be assessed at time of hepatic MRI. Gallstone without gallbladder inflammation. Colonic diverticulosis without diverticulitis.   07/09/18 Tumor marker work up: CEA normal at 1.32, AFP normal at 1.5, LDH normal at 138, CA 125 normal at 5.9, CA 19-9 normal at <2   07/22/18 Liver biopsy results revealed benign hepatic tissue, and was not diagnostic   07/16/18 PET/CT revealedThere is a solid hypermetabolic mass involving the cecum, worrisome for colonic primary neoplasm. Correlation withcolonoscopy Findings. 2. Multiple hypermetabolic liver lesions compatible with metastaticdisease. 3. Large low-density lesion in right lobe of thyroid gland. Advise further evaluation with thyroid sonography. 4. Aortic Atherosclerosis. 5. Small hiatal hernia.   08/27/18 Liver biopsy revealed Metastatic adenocarcinoma, consistent with gastrointestinal primary 08/27/18 Molecular pathology- KRAS mutation positive   09/03/18 ECHO revealed an LV EF of 649-17% mild diastolic dysfunction; mild RVE; dilated PA; mild TR; moderate to severe pulmonary hypertension  09/17/18 CEA at 2.39, which was the last CEA pre-treatment value   12/13/18 CT A/P revealed Interval response to therapy. There is been decrease in size of multifocal liver metastasis. No new or progressive findings. 2. Hiatal hernia.   Did postpone C6 by one week due to concern for mild infection.   Did postpone C7 by two weeks for infection  4. S/p Neutropenia  4. Stye   PLAN: -Discussed pt labwork today, 04/18/2019; blood chemistries are  steady, mild anemia, CEA is pending -Recommend wiping eyelids with warm water and mild soap in addition to Tobradex and Pataday eye drops prescribed by her PCP -Plan for repeat staging CTs after cycle 12  -The pt has no prohibitive toxicities from continuing C10D1 Folfox at this time.   -Will see the pt back in 3 weeks   RTC as per appointment for next treatment on 05/09/2019 with labs and MD visit   All of the patients questions were answered with apparent satisfaction. The patient knows to call the clinic with any problems, questions or concerns.  The total time spent in the appt was 25 minutes and more than 50% was on counseling and direct patient cares.   GSullivan LoneMD MBournevilleAAHIVMS SWoodhams Laser And Lens Implant Center LLCCVibra Hospital Of SacramentoHematology/Oncology Physician CSusan B Allen [facility] (Office):       [phone](Work  cell):  [phone] (Fax):           [phone]  04/18/2019 10:49 AM  I, De Burrs, am acting as a scribe for Dr. Irene Limbo  .I have reviewed the above documentation for accuracy and completeness, and I agree with the above. Brunetta Genera MD

## 2019-04-18 ENCOUNTER — Inpatient Hospital Stay: Payer: Medicaid Other

## 2019-04-18 ENCOUNTER — Inpatient Hospital Stay: Payer: Medicaid Other | Attending: Hematology

## 2019-04-18 ENCOUNTER — Telehealth: Payer: Self-pay | Admitting: Hematology

## 2019-04-18 ENCOUNTER — Inpatient Hospital Stay (HOSPITAL_BASED_OUTPATIENT_CLINIC_OR_DEPARTMENT_OTHER): Payer: Medicaid Other | Admitting: Hematology

## 2019-04-18 ENCOUNTER — Other Ambulatory Visit: Payer: Self-pay

## 2019-04-18 ENCOUNTER — Encounter: Payer: Self-pay | Admitting: Hematology

## 2019-04-18 VITALS — BP 159/92 | HR 96 | Temp 98.5°F | Resp 18 | Ht 70.0 in | Wt 398.0 lb

## 2019-04-18 DIAGNOSIS — K219 Gastro-esophageal reflux disease without esophagitis: Secondary | ICD-10-CM | POA: Diagnosis not present

## 2019-04-18 DIAGNOSIS — C189 Malignant neoplasm of colon, unspecified: Secondary | ICD-10-CM

## 2019-04-18 DIAGNOSIS — D500 Iron deficiency anemia secondary to blood loss (chronic): Secondary | ICD-10-CM | POA: Diagnosis not present

## 2019-04-18 DIAGNOSIS — Z5111 Encounter for antineoplastic chemotherapy: Secondary | ICD-10-CM | POA: Diagnosis not present

## 2019-04-18 DIAGNOSIS — Z452 Encounter for adjustment and management of vascular access device: Secondary | ICD-10-CM | POA: Insufficient documentation

## 2019-04-18 DIAGNOSIS — R109 Unspecified abdominal pain: Secondary | ICD-10-CM | POA: Diagnosis not present

## 2019-04-18 DIAGNOSIS — C787 Secondary malignant neoplasm of liver and intrahepatic bile duct: Secondary | ICD-10-CM

## 2019-04-18 DIAGNOSIS — E538 Deficiency of other specified B group vitamins: Secondary | ICD-10-CM | POA: Diagnosis not present

## 2019-04-18 DIAGNOSIS — G8929 Other chronic pain: Secondary | ICD-10-CM | POA: Insufficient documentation

## 2019-04-18 DIAGNOSIS — Z95828 Presence of other vascular implants and grafts: Secondary | ICD-10-CM

## 2019-04-18 DIAGNOSIS — Z87891 Personal history of nicotine dependence: Secondary | ICD-10-CM | POA: Insufficient documentation

## 2019-04-18 DIAGNOSIS — C180 Malignant neoplasm of cecum: Secondary | ICD-10-CM | POA: Diagnosis present

## 2019-04-18 DIAGNOSIS — Z7189 Other specified counseling: Secondary | ICD-10-CM

## 2019-04-18 DIAGNOSIS — D509 Iron deficiency anemia, unspecified: Secondary | ICD-10-CM

## 2019-04-18 DIAGNOSIS — Z79899 Other long term (current) drug therapy: Secondary | ICD-10-CM | POA: Insufficient documentation

## 2019-04-18 LAB — CBC WITH DIFFERENTIAL/PLATELET
Abs Immature Granulocytes: 0.01 10*3/uL (ref 0.00–0.07)
Basophils Absolute: 0 10*3/uL (ref 0.0–0.1)
Basophils Relative: 0 %
Eosinophils Absolute: 0.1 10*3/uL (ref 0.0–0.5)
Eosinophils Relative: 2 %
HCT: 32.3 % — ABNORMAL LOW (ref 36.0–46.0)
Hemoglobin: 9.8 g/dL — ABNORMAL LOW (ref 12.0–15.0)
Immature Granulocytes: 0 %
Lymphocytes Relative: 30 %
Lymphs Abs: 1.2 10*3/uL (ref 0.7–4.0)
MCH: 27.8 pg (ref 26.0–34.0)
MCHC: 30.3 g/dL (ref 30.0–36.0)
MCV: 91.8 fL (ref 80.0–100.0)
Monocytes Absolute: 0.4 10*3/uL (ref 0.1–1.0)
Monocytes Relative: 9 %
Neutro Abs: 2.3 10*3/uL (ref 1.7–7.7)
Neutrophils Relative %: 59 %
Platelets: 202 10*3/uL (ref 150–400)
RBC: 3.52 MIL/uL — ABNORMAL LOW (ref 3.87–5.11)
RDW: 14.8 % (ref 11.5–15.5)
WBC: 3.9 10*3/uL — ABNORMAL LOW (ref 4.0–10.5)
nRBC: 0 % (ref 0.0–0.2)

## 2019-04-18 LAB — CMP (CANCER CENTER ONLY)
ALT: 13 U/L (ref 0–44)
AST: 15 U/L (ref 15–41)
Albumin: 2.6 g/dL — ABNORMAL LOW (ref 3.5–5.0)
Alkaline Phosphatase: 85 U/L (ref 38–126)
Anion gap: 8 (ref 5–15)
BUN: 15 mg/dL (ref 6–20)
CO2: 26 mmol/L (ref 22–32)
Calcium: 9 mg/dL (ref 8.9–10.3)
Chloride: 109 mmol/L (ref 98–111)
Creatinine: 0.86 mg/dL (ref 0.44–1.00)
GFR, Est AFR Am: >60 mL/min (ref >60)
GFR, Estimated: >60 mL/min (ref >60)
Glucose, Bld: 109 mg/dL — ABNORMAL HIGH (ref 70–99)
Potassium: 4.2 mmol/L (ref 3.5–5.1)
Sodium: 143 mmol/L (ref 135–145)
Total Bilirubin: 0.3 mg/dL (ref 0.3–1.2)
Total Protein: 6.9 g/dL (ref 6.5–8.1)

## 2019-04-18 LAB — CEA (IN HOUSE-CHCC): CEA (CHCC-In House): 1.63 ng/mL (ref 0.00–5.00)

## 2019-04-18 MED ORDER — OXALIPLATIN CHEMO INJECTION 100 MG/20ML
86.0000 mg/m2 | Freq: Once | INTRAVENOUS | Status: AC
  Administered 2019-04-18: 12:00:00 250 mg via INTRAVENOUS
  Filled 2019-04-18: qty 40

## 2019-04-18 MED ORDER — LEUCOVORIN CALCIUM INJECTION 350 MG
400.0000 mg/m2 | Freq: Once | INTRAVENOUS | Status: AC
  Administered 2019-04-18: 1156 mg via INTRAVENOUS
  Filled 2019-04-18: qty 57.8

## 2019-04-18 MED ORDER — PROCHLORPERAZINE MALEATE 10 MG PO TABS
10.0000 mg | ORAL_TABLET | Freq: Once | ORAL | Status: AC
  Administered 2019-04-18: 13:00:00 10 mg via ORAL

## 2019-04-18 MED ORDER — PROCHLORPERAZINE MALEATE 10 MG PO TABS
ORAL_TABLET | ORAL | Status: AC
  Filled 2019-04-18: qty 1

## 2019-04-18 MED ORDER — PALONOSETRON HCL INJECTION 0.25 MG/5ML
0.2500 mg | Freq: Once | INTRAVENOUS | Status: AC
  Administered 2019-04-18: 0.25 mg via INTRAVENOUS

## 2019-04-18 MED ORDER — SODIUM CHLORIDE 0.9% FLUSH
10.0000 mL | Freq: Once | INTRAVENOUS | Status: AC
  Administered 2019-04-18: 10 mL
  Filled 2019-04-18: qty 10

## 2019-04-18 MED ORDER — DEXAMETHASONE SODIUM PHOSPHATE 10 MG/ML IJ SOLN
INTRAMUSCULAR | Status: AC
  Filled 2019-04-18: qty 1

## 2019-04-18 MED ORDER — SODIUM CHLORIDE 0.9 % IV SOLN
2400.0000 mg/m2 | INTRAVENOUS | Status: DC
  Administered 2019-04-18: 16:00:00 6950 mg via INTRAVENOUS
  Filled 2019-04-18: qty 139

## 2019-04-18 MED ORDER — DEXTROSE 5 % IV SOLN
Freq: Once | INTRAVENOUS | Status: AC
  Administered 2019-04-18: 12:00:00 via INTRAVENOUS
  Filled 2019-04-18: qty 250

## 2019-04-18 MED ORDER — DEXAMETHASONE SODIUM PHOSPHATE 10 MG/ML IJ SOLN
10.0000 mg | Freq: Once | INTRAMUSCULAR | Status: AC
  Administered 2019-04-18: 10 mg via INTRAVENOUS

## 2019-04-18 MED ORDER — PALONOSETRON HCL INJECTION 0.25 MG/5ML
INTRAVENOUS | Status: AC
  Filled 2019-04-18: qty 5

## 2019-04-18 MED ORDER — DEXTROSE 5 % IV SOLN
Freq: Once | INTRAVENOUS | Status: AC
  Administered 2019-04-18: 11:00:00 via INTRAVENOUS
  Filled 2019-04-18: qty 250

## 2019-04-18 NOTE — Telephone Encounter (Signed)
Per 8/3 los RTC as per appointment for next treatment on 05/09/2019 with labs and MD visit.

## 2019-04-18 NOTE — Patient Instructions (Signed)

## 2019-04-18 NOTE — Patient Instructions (Signed)
Coronavirus (COVID-19) Are you at risk?  Are you at risk for the Coronavirus (COVID-19)?  To be considered HIGH RISK for Coronavirus (COVID-19), you have to meet the following criteria:  . Traveled to [place], [place], [place], [place] or [place]; or in the [place] to [place], [place], [place], or [place]; and have fever, cough, and shortness of breath within the last 2 weeks of travel OR . Been in close contact with a person diagnosed with COVID-19 within the last 2 weeks and have fever, cough, and shortness of breath . IF YOU DO NOT MEET THESE CRITERIA, YOU ARE CONSIDERED LOW RISK FOR COVID-19.  What to do if you are HIGH RISK for COVID-19?  . If you are having a medical emergency, call 911. . Seek medical care right away. Before you go to a doctor's office, urgent care or emergency department, call ahead and tell them about your recent travel, contact with someone diagnosed with COVID-19, and your symptoms. You should receive instructions from your physician's office regarding next steps of care.  . When you arrive at healthcare provider, tell the healthcare staff immediately you have returned from visiting [place], [place], [place], [place] or [place]; or traveled in the [place] to [place], [place], [place], or [place]; in the last two weeks or you have been in close contact with a person diagnosed with COVID-19 in the last 2 weeks.   . Tell the health care staff about your symptoms: fever, cough and shortness of breath. . After you have been seen by a medical provider, you will be either: o Tested for (COVID-19) and discharged home on quarantine except to seek medical care if symptoms worsen, and asked to  - Stay home and avoid contact with others until you get your results (4-5 days)  - Avoid travel on public transportation if possible (such as bus, train, or airplane) or o Sent to the Emergency Department by EMS for evaluation, COVID-19 testing, and possible  admission depending on your condition and test results.  What to do if you are LOW RISK for COVID-19?  Reduce your risk of any infection by using the same precautions used for avoiding the common cold or flu:  . Wash your hands often with soap and warm water for at least 20 seconds.  If soap and water are not readily available, use an alcohol-based hand sanitizer with at least 60% alcohol.  . If coughing or sneezing, cover your mouth and nose by coughing or sneezing into the elbow areas of your shirt or coat, into a tissue or into your sleeve (not your hands). . Avoid shaking hands with others and consider head nods or verbal greetings only. . Avoid touching your eyes, nose, or mouth with unwashed hands.  . Avoid close contact with people who are sick. . Avoid places or events with large numbers of people in one location, like concerts or sporting events. . Carefully consider travel plans you have or are making. . If you are planning any travel outside or inside the [place], visit the CDC's Travelers' Health webpage for the latest health notices. . If you have some symptoms but not all symptoms, continue to monitor at home and seek medical attention if your symptoms worsen. . If you are having a medical emergency, call 911.   ADDITIONAL HEALTHCARE OPTIONS FOR PATIENTS  [facility] / e-Visit: [email or web address]         [facility]: [phone]  [facility]: [phone]                   [facility]: [phone]   [facility] Discharge Instructions for Patients Receiving Chemotherapy  Today you received the following chemotherapy agents Oxaliplatin, Leucovorin and Adrucil   To help prevent nausea and vomiting after your treatment, we encourage you to take your nausea medication as directed.    If you develop nausea and vomiting that is not controlled by your nausea medication, call  the clinic.   BELOW ARE SYMPTOMS THAT SHOULD BE REPORTED IMMEDIATELY:  *FEVER GREATER THAN 100.5 F  *CHILLS WITH OR WITHOUT FEVER  NAUSEA AND VOMITING THAT IS NOT CONTROLLED WITH YOUR NAUSEA MEDICATION  *UNUSUAL SHORTNESS OF BREATH  *UNUSUAL BRUISING OR BLEEDING  TENDERNESS IN MOUTH AND THROAT WITH OR WITHOUT PRESENCE OF ULCERS  *URINARY PROBLEMS  *BOWEL PROBLEMS  UNUSUAL RASH Items with * indicate a potential emergency and should be followed up as soon as possible.  Feel free to call the clinic should you have any questions or concerns. The clinic phone number is [phone].  Please show the CHEMO ALERT CARD at check-in to the Emergency Department and triage nurse.   

## 2019-04-20 ENCOUNTER — Other Ambulatory Visit: Payer: Self-pay

## 2019-04-20 ENCOUNTER — Inpatient Hospital Stay: Payer: Medicaid Other

## 2019-04-20 VITALS — BP 148/78 | HR 89 | Temp 98.6°F | Resp 18

## 2019-04-20 DIAGNOSIS — Z7189 Other specified counseling: Secondary | ICD-10-CM

## 2019-04-20 DIAGNOSIS — Z452 Encounter for adjustment and management of vascular access device: Secondary | ICD-10-CM | POA: Diagnosis not present

## 2019-04-20 DIAGNOSIS — C189 Malignant neoplasm of colon, unspecified: Secondary | ICD-10-CM

## 2019-04-20 MED ORDER — HEPARIN SOD (PORK) LOCK FLUSH 100 UNIT/ML IV SOLN
500.0000 [IU] | Freq: Once | INTRAVENOUS | Status: AC | PRN
  Administered 2019-04-20: 500 [IU]
  Filled 2019-04-20: qty 5

## 2019-04-20 MED ORDER — SODIUM CHLORIDE 0.9% FLUSH
10.0000 mL | INTRAVENOUS | Status: DC | PRN
  Administered 2019-04-20: 14:00:00 10 mL
  Filled 2019-04-20: qty 10

## 2019-05-02 ENCOUNTER — Other Ambulatory Visit: Payer: Self-pay | Admitting: *Deleted

## 2019-05-04 ENCOUNTER — Encounter: Payer: Self-pay | Admitting: Hematology

## 2019-05-09 ENCOUNTER — Ambulatory Visit: Payer: Medicaid Other

## 2019-05-09 ENCOUNTER — Other Ambulatory Visit: Payer: Medicaid Other

## 2019-05-09 ENCOUNTER — Ambulatory Visit: Payer: Medicaid Other | Admitting: Hematology

## 2019-05-14 ENCOUNTER — Other Ambulatory Visit: Payer: Self-pay | Admitting: Allergy & Immunology

## 2019-05-14 ENCOUNTER — Other Ambulatory Visit: Payer: Self-pay | Admitting: Hematology

## 2019-05-14 DIAGNOSIS — C189 Malignant neoplasm of colon, unspecified: Secondary | ICD-10-CM

## 2019-05-14 DIAGNOSIS — Z7189 Other specified counseling: Secondary | ICD-10-CM

## 2019-05-22 NOTE — Progress Notes (Signed)
HEMATOLOGY/ONCOLOGY CLINIC NOTE  Date of Service: 05/24/2019  Patient Care Team: Nolene Ebbs, MD as PCP - General (Internal Medicine) Jackelyn Knife, MD as Rounding Team (Internal Medicine)   CHIEF COMPLAINTS/PURPOSE OF CONSULTATION:  Recently diagnosed Metastatic Adenocarcinoma of colon   HISTORY OF PRESENTING ILLNESS:  Margaret [name] is a wonderful 48 y.o. female who has been referred to [place] by Dr. Nolene Ebbs for evaluation and management of Iron deficiency anemia. The pt reports that she is doing well overall.   The pt recently presented to the ED on 04/16/18 for right sided abdominal pain that was evaluated with a CT A/P which revealed liver and pancreatic cyst, recommended for outpatient MRI follow up. The pt was found to have a UTI, was treated with Rocephin and discharged with Keflex. Prior to this the pt was admitted on 04/09/18 for symptomatic anemia, presenting with HGB at 6.2, received 2 units PRBCs, one IV Iron infusion, and was encouraged to seek out GI as outpatient.  She has an appointment with [facility] on 05/19/18.   The pt reports that she is still feeling dizzy and light headed and denies feeling better after her recent blood transfusion. She notes that she dizziness presents with a feeling of warmness and that she begins hyperventilating, feeling nervous about her dizziness.  She notes that prior to her recent hospital stay, she never required a blood transfusion or IV iron replacement. She notes that as a child she had frequent nose bleeds, and was diagnosed with anemia. She also notes a history of ice picca.   She had a hysterectomy in 2012, and prior to this she had very heavy periods that began at age 2 and occurred almost constantly. She was on Depo and Mirena which did not help slow menstrual losses. Besides taking iron supplements while pregnant she has not taken PO iron replacement as she reports not tolerating it very well while pregnant.   She denies  concern for black stools or blood in the stools and has not had a colonoscopy or endoscopy before. The pt notes that she has not previously had a concern for stomach ulcers, but has acid reflux and takes Prevacid as needed for 7-8 years. She notes that she has not recently used Prevacid, for the last 3 months.  She is not taking vitamin replacements and denies any dietary restrictions.  The pt notes that she has been continuing to have lower right quadrant abdominal pain that radiates across her abdomen, presents for up to 45 minutes and occurs intermittently. She notes that her abdominal pain presented on 04/13/18.   The pt notes that was taking Ibuprofen 832m BID for 8 years to address her back pain related to her herniated disk. She stopped taking this 4 months ago.   Of note prior to the patient's visit today, pt has had CT A/P completed on 04/16/18 with results revealing No acute findings in the abdomen/pelvis. Least 4 low-density hepatic lesions, some of which may represent small cysts or hemangiomas, however there is a 19 mm lesion in the left lobe of the liver that is incompletely characterized. Recommend nonemergent hepatic protocol MRI for further evaluation..Marland KitchenPossible 11 mm pancreatic nodule rising from the tail, alternatively this may represent a splenule. This can also be assessed at time of hepatic MRI. Gallstone without gallbladder inflammation. Colonic diverticulosis without diverticulitis.   Most recent lab results (04/16/18) of CBC is as follows: all values are WNL except for HGB at 9.5, HCT  at 33.6, MCH at 23.8, MCHC at 28.3, RDW at 23.2, PLT at 435k. Ferritin 04/09/18 was low at 6 Vitamin B12 on 04/09/18 was at 196  On review of systems, pt reports light headedness, dizziness, lower back pain, intermittent abdominal pain, and denies black stools, blood in the stools, tingling or numbness in her legs or arms, vaginal bleeding, and any other symptoms.   INTERVAL HISTORY:   Margaret [name] returns today for management, evaluation, and C11D1 FOLFOX  treatment of her newly diagnosed Metastatic Adenocarcinoma of GI primary. The patient's last visit with [place] was on 04/18/2019. The pt reports that she is doing well overall.  The pt reports that she has been doing okay but is experiencing stress due to helping to take care of a new grandchild as well as assisting her children with online school. Her knee is improving and she is now able to walk on it but she is still experiencing a burning sensation. Pt has been drinking a lot of water since she has began to fast. She has a colonoscopy scheduled for October.    Lab results today (05/24/19) of CBC w/diff and CMP is as follows: all values are WNL except for RBC at 3.49, Hgb at 9.8, HCT at 32.8, MCHC at 29.9, RDW at 16.0, Albumin at 2.7.   On review of systems, pt reports occasional SOB and denies mouth sores, pain around her port, new leg swelling, headaches, abdominal pain, uncontrolled nausea/diarrhea and any other symptoms.    MEDICAL HISTORY:  Past Medical History:  Diagnosis Date   Allergic rhinitis    Anxiety diagnosed in 1990   Arthritis    Asthma    Dyspnea    with low blood count hx of anemia   Eczema    History of blood transfusion    Hypertension    Lower back pain    Migraine    Sleep apnea    uses CPAP    SURGICAL HISTORY: Past Surgical History:  Procedure Laterality Date   ABDOMINAL HYSTERECTOMY     cortisone injections     knees bilat and back    IR IMAGING GUIDED PORT INSERTION  09/14/2018   iron infusion     LIVER BIOPSY      SOCIAL HISTORY: Social History   Socioeconomic History   Marital status: Single    Spouse name: Not on file   Number of children: Not on file   Years of education: Not on file   Highest education level: Not on file  Occupational History   Not on file  Social Needs   Financial resource strain: Not on file   Food insecurity    Worry: Not on  file    Inability: Not on file   Transportation needs    Medical: Not on file    Non-medical: Not on file  Tobacco Use   Smoking status: Former Smoker    Quit date: 07/30/1999    Years since quitting: 19.8   Smokeless tobacco: Never Used  Substance and Sexual Activity   Alcohol use: No   Drug use: No   Sexual activity: Not on file  Lifestyle   Physical activity    Days per week: Not on file    Minutes per session: Not on file   Stress: Not on file  Relationships   Social connections    Talks on phone: Not on file    Gets together: Not on file    Attends religious service: Not  on file    Active member of club or organization: Not on file    Attends meetings of clubs or organizations: Not on file    Relationship status: Not on file   Intimate partner violence    Fear of current or ex partner: Not on file    Emotionally abused: Not on file    Physically abused: Not on file    Forced sexual activity: Not on file  Other Topics Concern   Not on file  Social History Narrative   Not on file    FAMILY HISTORY: No family history on file.  ALLERGIES:  is allergic to latex; tape; and tylenol with codeine #3 [acetaminophen-codeine].  MEDICATIONS:  Current Outpatient Medications  Medication Sig Dispense Refill   albuterol (PROAIR HFA) 108 (90 Base) MCG/ACT inhaler INHALE TWO puffs BY MOUTH into the lungs EVERY 4 HOURS AS NEEDED FOR WHEEZING OR shortness of breath 18 g 1   Boric Acid 4 % SOLN Place 1 capsule vaginally every Friday.      budesonide-formoterol (SYMBICORT) 160-4.5 MCG/ACT inhaler Inhale 2 puffs into the lungs 2 (two) times a day. 10.2 g 5   cetirizine (ZYRTEC) 10 MG tablet Take 1 tablet (10 mg total) by mouth daily. 30 tablet 5   clobetasol ointment (TEMOVATE) 6.07 % Apply 1 application topically 2 (two) times daily. Use for Lichen Sclerosis     Cyanocobalamin (B-12) 1000 MCG SUBL Place 1,000 mcg under the tongue daily. 30 each 3   dexamethasone  (DECADRON) 4 MG tablet TAKE 2 TABLETS (8 MG TOTAL) BY MOUTH DAILY  START THE DAY AFTER CHEMOTHERAPY FOR 2 DAYS. TAKE WITH FOOD. 30 tablet 1   diazepam (VALIUM) 5 MG tablet Take 1 tablet (5 mg total) by mouth daily as needed for anxiety (panic attacks). 30 tablet 0   diclofenac sodium (VOLTAREN) 1 % GEL Apply 2 g topically 4 (four) times daily as needed (for back/knee pain.).      dicyclomine (BENTYL) 20 MG tablet Take 1 tablet (20 mg total) by mouth 2 (two) times daily. (Patient taking differently: Take 20 mg by mouth 3 (three) times daily before meals. ) 20 tablet 0   dronabinol (MARINOL) 5 MG capsule Take 1 capsule (5 mg total) by mouth 2 (two) times daily before a meal. 60 capsule 0   ergocalciferol (VITAMIN D2) 50000 UNITS capsule Take 50,000 Units by mouth every Monday.      lidocaine-prilocaine (EMLA) cream Apply to affected area once 30 g 3   lisinopril-hydrochlorothiazide (PRINZIDE,ZESTORETIC) 20-12.5 MG tablet Take 1 tablet by mouth daily.     methocarbamol (ROBAXIN) 750 MG tablet Take 750 mg by mouth every 8 (eight) hours as needed (back spasms.).      montelukast (SINGULAIR) 10 MG tablet Take 1 tablet (10 mg total) by mouth at bedtime. 30 tablet 6   nystatin-triamcinolone (MYCOLOG II) cream Apply 1 application topically 2 (two) times daily as needed (irritation). Use in skin folds and under breast      Olopatadine HCl (PAZEO) 0.7 % SOLN Apply 1 drop to eye daily. 2.5 mL 5   ondansetron (ZOFRAN) 8 MG tablet Take 1 tablet (8 mg total) by mouth 2 (two) times daily as needed for refractory nausea / vomiting. Start on day 3 after chemotherapy. 30 tablet 0   pantoprazole (PROTONIX) 40 MG tablet Take 1 tablet (40 mg total) by mouth daily before breakfast. 30 tablet 1   pimecrolimus (ELIDEL) 1 % cream Apply 1 application topically 2 (two)  times daily. 30 g 6   prochlorperazine (COMPAZINE) 10 MG tablet Take 1 tablet (10 mg total) by mouth every 6 (six) hours as needed (Nausea or  vomiting). 30 tablet 1   saccharomyces boulardii (FLORASTOR) 250 MG capsule Take 1 capsule (250 mg total) by mouth 2 (two) times daily.     traMADol (ULTRAM) 50 MG tablet Take 1-2 tablets (50-100 mg total) by mouth every 6 (six) hours as needed for moderate pain or severe pain. 90 tablet 0   triamcinolone (NASACORT) 55 MCG/ACT AERO nasal inhaler Place 2 sprays into the nose daily as needed (for allergies). 1 Inhaler 5   valACYclovir (VALTREX) 500 MG tablet Take 500 mg by mouth 2 (two) times daily as needed (for cold sores).      No current facility-administered medications for this visit.    Facility-Administered Medications Ordered in Other Visits  Medication Dose Route Frequency Provider Last Rate Last Dose   fluorouracil (ADRUCIL) 6,950 mg in sodium chloride 0.9 % 111 mL chemo infusion  2,400 mg/m2 (Treatment Plan Recorded) Intravenous 1 day or 1 dose Brunetta Genera, MD       leucovorin 1,156 mg in dextrose 5 % 250 mL infusion  400 mg/m2 (Treatment Plan Recorded) Intravenous Once Brunetta Genera, MD 77 mL/hr at 05/24/19 1242 1,156 mg at 05/24/19 1242   oxaliplatin (ELOXATIN) 250 mg in dextrose 5 % 500 mL chemo infusion  250 mg Intravenous Once Brunetta Genera, MD 138 mL/hr at 05/24/19 1245 250 mg at 05/24/19 1245    REVIEW OF SYSTEMS:    A 10+ POINT REVIEW OF SYSTEMS WAS OBTAINED including neurology, dermatology, psychiatry, cardiac, respiratory, lymph, extremities, GI, GU, Musculoskeletal, constitutional, breasts, reproductive, HEENT.  All pertinent positives are noted in the HPI.  All others are negative.   PHYSICAL EXAMINATION:  Vitals:   05/24/19 1012  BP: (!) 126/115  Pulse: 90  Resp: 18  Temp: 98.7 F (37.1 C)  SpO2: 100%    Wt Readings from Last 3 Encounters:  05/24/19 (!) 407 lb 9.6 oz (184.9 kg)  04/18/19 (!) 398 lb (180.5 kg)  04/04/19 (!) 393 lb 9.6 oz (178.5 kg)   Body mass index is 58.48 kg/m.   GENERAL:alert, in no acute distress and  comfortable SKIN: no acute rashes, no significant lesions EYES: conjunctiva are pink and non-injected, sclera anicteric OROPHARYNX: MMM, no exudates, no oropharyngeal erythema or ulceration NECK: supple, no JVD LYMPH:  no palpable lymphadenopathy in the cervical, axillary or inguinal regions LUNGS: clear to auscultation b/l with normal respiratory effort HEART: regular rate & rhythm ABDOMEN:  normoactive bowel sounds , non tender, not distended. No palpable hepatosplenomegaly.  Extremity: no pedal edema PSYCH: alert & oriented x 3 with fluent speech NEURO: no focal motor/sensory deficits   LABORATORY DATA:  I have reviewed the data as listed  . CBC Latest Ref Rng & Units 05/24/2019 04/18/2019 04/04/2019  WBC 4.0 - 10.5 K/uL 5.4 3.9(L) 6.9  Hemoglobin 12.0 - 15.0 g/dL 9.8(L) 9.8(L) 10.6(L)  Hematocrit 36.0 - 46.0 % 32.8(L) 32.3(L) 35.0(L)  Platelets 150 - 400 K/uL 265 202 233   ANC 900 . CMP Latest Ref Rng & Units 05/24/2019 04/18/2019 04/04/2019  Glucose 70 - 99 mg/dL 92 109(H) 95  BUN 6 - 20 mg/dL _0 Creatinine 0.44 - 1.00 mg/dL 0.89 0.86 0.82  Sodium 135 - 145 mmol/L 142 143 144  Potassium 3.5 - 5.1 mmol/L 4.1 4.2 4.2  Chloride 98 - 111 mmol/L 110  109 109  CO2 22 - 32 mmol/L _0 Calcium 8.9 - 10.3 mg/dL 8.9 9.0 9.0  Total Protein 6.5 - 8.1 g/dL 7.5 6.9 7.5  Total Bilirubin 0.3 - 1.2 mg/dL 0.3 0.3 0.3  Alkaline Phos 38 - 126 U/L 75 85 86  AST 15 - 41 U/L 16 15 14(L)  ALT 0 - 44 U/L _1 . Lab Results  Component Value Date   IRON 24 (L) 09/06/2018   TIBC 156 (L) 09/06/2018   IRONPCTSAT 15 (L) 09/06/2018   (Iron and TIBC)  Lab Results  Component Value Date   FERRITIN 540 (H) 11/03/2018   B12  - 196--> 584  07/22/18 Liver Biopsy:   08/27/18 Repeat Liver Biopsy:   Molecular Pathology:     RADIOGRAPHIC STUDIES: I have personally reviewed the radiological images as listed and agreed with the findings in the report. No results found.  ASSESSMENT  & PLAN:   48 y.o. female with  1. Iron Deficiency Anemia - ? Previous heavy periods vs GI losses  Recent heavy NSAID use ? Ulcer  2. B12 deficiency Labs upon initial presentation from 04/16/18, HGB at 9.5. The 04/09/18 Ferritin was low at 6 and Vitamin B12 was at 196 -No antiparietal antibodies, no intrinsic factor antibodies, normal TSH all from 04/28/18  PLAN: -Continue NSAID avoidance  -continue 1083mg Vitamin B12 sublingually daily - B12 improved from 196 to 584  3.MetastaticAdenocarcinoma of cecum with liver mets. KRAS mutated.  04/16/18 CT A/P with pt revealed No acute findings in the abdomen/pelvis. Least 4 low-density hepatic lesions, some of which may represent small cysts or hemangiomas, however there is a 19 mm lesion in the left lobe of the liver that is incompletely characterized. Recommend nonemergent hepatic protocol MRI for further evaluation..Marland KitchenPossible 11 mm pancreatic nodule rising from the tail, alternatively this may represent a splenule. This can also be assessed at time of hepatic MRI. Gallstone without gallbladder inflammation. Colonic diverticulosis without diverticulitis.   07/09/18 Tumor marker work up: CEA normal at 1.32, AFP normal at 1.5, LDH normal at 138, CA 125 normal at 5.9, CA 19-9 normal at <2   07/22/18 Liver biopsy results revealed benign hepatic tissue, and was not diagnostic   07/16/18 PET/CT revealedThere is a solid hypermetabolic mass involving the cecum, worrisome for colonic primary neoplasm. Correlation withcolonoscopy Findings. 2. Multiple hypermetabolic liver lesions compatible with metastaticdisease. 3. Large low-density lesion in right lobe of thyroid gland. Advise further evaluation with thyroid sonography. 4. Aortic Atherosclerosis. 5. Small hiatal hernia.   08/27/18 Liver biopsy revealed Metastatic adenocarcinoma, consistent with gastrointestinal primary 08/27/18 Molecular pathology- KRAS mutation positive   09/03/18 ECHO revealed  an LV EF of 634-19% mild diastolic dysfunction; mild RVE; dilated PA; mild TR; moderate to severe pulmonary hypertension  09/17/18 CEA at 2.39, which was the last CEA pre-treatment value   12/13/18 CT A/P revealed Interval response to therapy. There is been decrease in size of multifocal liver metastasis. No new or progressive findings. 2. Hiatal hernia.   Did postpone C6 by one week due to concern for mild infection.   Did postpone C7 by two weeks for infection  4. S/p Neutropenia  4. Stye   PLAN: -Discussed pt labwork today, 05/24/19; all values are WNL except for RBC at 3.49, Hgb at 9.8, HCT at 32.8, MCHC at 29.9, RDW at 16.0, Albumin at 2.7. Blood counts are steady  -Continue to hold Avastin because of the intolerance -If all labs  looks stable and regressed we will consider moving to maintenance treatment -Plan for repeat staging CTs after cycle 12  -The pt has no prohibitive toxicities from continuing C11D1 Folfox at this time. -Will see back in 4 weeks    FOLLOW UP: Plz schedule next 2 cycles of FOLFOX as ordered with labs with portflush CT chest/abd/pelvis in 3 weeks RTC with Dr Irene Limbo in 4 weeks   The total time spent in the appt was 25 minutes and more than 50% was on counseling and direct patient cares.  All of the patient's questions were answered with apparent satisfaction. The patient knows to call the clinic with any problems, questions or concerns.    Sullivan Lone MD [place] AAHIVMS [facility] Hematology/Oncology Physician [facility]  (Office):       [phone] (Work cell):  [phone] (Fax):           [phone]  05/24/2019 4:04 PM  I, Yevette Edwards, am acting as a scribe for Dr. Sullivan Lone.   .I have reviewed the above documentation for accuracy and completeness, and I agree with the above. Brunetta Genera MD

## 2019-05-24 ENCOUNTER — Inpatient Hospital Stay (HOSPITAL_BASED_OUTPATIENT_CLINIC_OR_DEPARTMENT_OTHER): Payer: Medicaid Other | Admitting: Hematology

## 2019-05-24 ENCOUNTER — Other Ambulatory Visit: Payer: Self-pay

## 2019-05-24 ENCOUNTER — Encounter: Payer: Self-pay | Admitting: Hematology

## 2019-05-24 ENCOUNTER — Inpatient Hospital Stay: Payer: Medicaid Other

## 2019-05-24 ENCOUNTER — Inpatient Hospital Stay: Payer: Medicaid Other | Attending: Hematology

## 2019-05-24 VITALS — BP 140/86 | HR 84

## 2019-05-24 VITALS — BP 126/115 | HR 90 | Temp 98.7°F | Resp 18 | Ht 70.0 in | Wt >= 6400 oz

## 2019-05-24 DIAGNOSIS — C787 Secondary malignant neoplasm of liver and intrahepatic bile duct: Secondary | ICD-10-CM | POA: Insufficient documentation

## 2019-05-24 DIAGNOSIS — J45909 Unspecified asthma, uncomplicated: Secondary | ICD-10-CM | POA: Diagnosis not present

## 2019-05-24 DIAGNOSIS — C180 Malignant neoplasm of cecum: Secondary | ICD-10-CM | POA: Diagnosis present

## 2019-05-24 DIAGNOSIS — E538 Deficiency of other specified B group vitamins: Secondary | ICD-10-CM | POA: Insufficient documentation

## 2019-05-24 DIAGNOSIS — Z79899 Other long term (current) drug therapy: Secondary | ICD-10-CM | POA: Insufficient documentation

## 2019-05-24 DIAGNOSIS — Z791 Long term (current) use of non-steroidal anti-inflammatories (NSAID): Secondary | ICD-10-CM | POA: Diagnosis not present

## 2019-05-24 DIAGNOSIS — I272 Pulmonary hypertension, unspecified: Secondary | ICD-10-CM | POA: Insufficient documentation

## 2019-05-24 DIAGNOSIS — R42 Dizziness and giddiness: Secondary | ICD-10-CM | POA: Insufficient documentation

## 2019-05-24 DIAGNOSIS — K219 Gastro-esophageal reflux disease without esophagitis: Secondary | ICD-10-CM | POA: Diagnosis not present

## 2019-05-24 DIAGNOSIS — M545 Low back pain: Secondary | ICD-10-CM | POA: Insufficient documentation

## 2019-05-24 DIAGNOSIS — D509 Iron deficiency anemia, unspecified: Secondary | ICD-10-CM | POA: Insufficient documentation

## 2019-05-24 DIAGNOSIS — Z7189 Other specified counseling: Secondary | ICD-10-CM

## 2019-05-24 DIAGNOSIS — Z7951 Long term (current) use of inhaled steroids: Secondary | ICD-10-CM | POA: Diagnosis not present

## 2019-05-24 DIAGNOSIS — Z452 Encounter for adjustment and management of vascular access device: Secondary | ICD-10-CM | POA: Insufficient documentation

## 2019-05-24 DIAGNOSIS — Z5111 Encounter for antineoplastic chemotherapy: Secondary | ICD-10-CM

## 2019-05-24 DIAGNOSIS — C189 Malignant neoplasm of colon, unspecified: Secondary | ICD-10-CM

## 2019-05-24 DIAGNOSIS — I10 Essential (primary) hypertension: Secondary | ICD-10-CM | POA: Diagnosis not present

## 2019-05-24 LAB — CBC WITH DIFFERENTIAL/PLATELET
Abs Immature Granulocytes: 0.01 10*3/uL (ref 0.00–0.07)
Basophils Absolute: 0 10*3/uL (ref 0.0–0.1)
Basophils Relative: 0 %
Eosinophils Absolute: 0.1 10*3/uL (ref 0.0–0.5)
Eosinophils Relative: 2 %
HCT: 32.8 % — ABNORMAL LOW (ref 36.0–46.0)
Hemoglobin: 9.8 g/dL — ABNORMAL LOW (ref 12.0–15.0)
Immature Granulocytes: 0 %
Lymphocytes Relative: 33 %
Lymphs Abs: 1.8 10*3/uL (ref 0.7–4.0)
MCH: 28.1 pg (ref 26.0–34.0)
MCHC: 29.9 g/dL — ABNORMAL LOW (ref 30.0–36.0)
MCV: 94 fL (ref 80.0–100.0)
Monocytes Absolute: 0.5 10*3/uL (ref 0.1–1.0)
Monocytes Relative: 10 %
Neutro Abs: 3 10*3/uL (ref 1.7–7.7)
Neutrophils Relative %: 55 %
Platelets: 265 10*3/uL (ref 150–400)
RBC: 3.49 MIL/uL — ABNORMAL LOW (ref 3.87–5.11)
RDW: 16 % — ABNORMAL HIGH (ref 11.5–15.5)
WBC: 5.4 10*3/uL (ref 4.0–10.5)
nRBC: 0 % (ref 0.0–0.2)

## 2019-05-24 LAB — CMP (CANCER CENTER ONLY)
ALT: 9 U/L (ref 0–44)
AST: 16 U/L (ref 15–41)
Albumin: 2.7 g/dL — ABNORMAL LOW (ref 3.5–5.0)
Alkaline Phosphatase: 75 U/L (ref 38–126)
Anion gap: 9 (ref 5–15)
BUN: 15 mg/dL (ref 6–20)
CO2: 23 mmol/L (ref 22–32)
Calcium: 8.9 mg/dL (ref 8.9–10.3)
Chloride: 110 mmol/L (ref 98–111)
Creatinine: 0.89 mg/dL (ref 0.44–1.00)
GFR, Est AFR Am: >60 mL/min (ref >60)
GFR, Estimated: >60 mL/min (ref >60)
Glucose, Bld: 92 mg/dL (ref 70–99)
Potassium: 4.1 mmol/L (ref 3.5–5.1)
Sodium: 142 mmol/L (ref 135–145)
Total Bilirubin: 0.3 mg/dL (ref 0.3–1.2)
Total Protein: 7.5 g/dL (ref 6.5–8.1)

## 2019-05-24 MED ORDER — DEXTROSE 5 % IV SOLN
Freq: Once | INTRAVENOUS | Status: AC
  Administered 2019-05-24: 11:00:00 via INTRAVENOUS
  Filled 2019-05-24: qty 250

## 2019-05-24 MED ORDER — PALONOSETRON HCL INJECTION 0.25 MG/5ML
0.2500 mg | Freq: Once | INTRAVENOUS | Status: AC
  Administered 2019-05-24: 0.25 mg via INTRAVENOUS

## 2019-05-24 MED ORDER — SODIUM CHLORIDE 0.9 % IV SOLN
2400.0000 mg/m2 | INTRAVENOUS | Status: DC
  Administered 2019-05-24: 6950 mg via INTRAVENOUS
  Filled 2019-05-24: qty 139

## 2019-05-24 MED ORDER — LEUCOVORIN CALCIUM INJECTION 350 MG
400.0000 mg/m2 | Freq: Once | INTRAVENOUS | Status: AC
  Administered 2019-05-24: 1156 mg via INTRAVENOUS
  Filled 2019-05-24: qty 57.8

## 2019-05-24 MED ORDER — PALONOSETRON HCL INJECTION 0.25 MG/5ML
INTRAVENOUS | Status: AC
  Filled 2019-05-24: qty 5

## 2019-05-24 MED ORDER — DEXAMETHASONE SODIUM PHOSPHATE 10 MG/ML IJ SOLN
INTRAMUSCULAR | Status: AC
  Filled 2019-05-24: qty 1

## 2019-05-24 MED ORDER — OXALIPLATIN CHEMO INJECTION 100 MG/20ML
250.0000 mg | Freq: Once | INTRAVENOUS | Status: AC
  Administered 2019-05-24: 250 mg via INTRAVENOUS
  Filled 2019-05-24: qty 40

## 2019-05-24 MED ORDER — DEXAMETHASONE SODIUM PHOSPHATE 10 MG/ML IJ SOLN
10.0000 mg | Freq: Once | INTRAMUSCULAR | Status: AC
  Administered 2019-05-24: 10 mg via INTRAVENOUS

## 2019-05-24 NOTE — Patient Instructions (Signed)
[facility] Discharge Instructions for Patients Receiving Chemotherapy  Today you received the following chemotherapy agents: Oxaliplatin, Leucovorin, and 5FU.  To help prevent nausea and vomiting after your treatment, we encourage you to take your nausea medication as directed.   If you develop nausea and vomiting that is not controlled by your nausea medication, call the clinic.   BELOW ARE SYMPTOMS THAT SHOULD BE REPORTED IMMEDIATELY:  *FEVER GREATER THAN 100.5 F  *CHILLS WITH OR WITHOUT FEVER  NAUSEA AND VOMITING THAT IS NOT CONTROLLED WITH YOUR NAUSEA MEDICATION  *UNUSUAL SHORTNESS OF BREATH  *UNUSUAL BRUISING OR BLEEDING  TENDERNESS IN MOUTH AND THROAT WITH OR WITHOUT PRESENCE OF ULCERS  *URINARY PROBLEMS  *BOWEL PROBLEMS  UNUSUAL RASH Items with * indicate a potential emergency and should be followed up as soon as possible.  Feel free to call the clinic should you have any questions or concerns. The clinic phone number is [phone].  Please show the CHEMO ALERT CARD at check-in to the Emergency Department and triage nurse.    

## 2019-05-25 ENCOUNTER — Telehealth: Payer: Self-pay | Admitting: Hematology

## 2019-05-25 NOTE — Telephone Encounter (Signed)
Appts already scheduled per the 9/8 los.

## 2019-05-26 ENCOUNTER — Inpatient Hospital Stay: Payer: Medicaid Other

## 2019-05-26 ENCOUNTER — Other Ambulatory Visit: Payer: Self-pay

## 2019-05-26 VITALS — BP 156/70 | HR 74 | Temp 98.3°F | Resp 16

## 2019-05-26 DIAGNOSIS — Z452 Encounter for adjustment and management of vascular access device: Secondary | ICD-10-CM | POA: Diagnosis not present

## 2019-05-26 DIAGNOSIS — C189 Malignant neoplasm of colon, unspecified: Secondary | ICD-10-CM

## 2019-05-26 DIAGNOSIS — Z7189 Other specified counseling: Secondary | ICD-10-CM

## 2019-05-26 MED ORDER — HEPARIN SOD (PORK) LOCK FLUSH 100 UNIT/ML IV SOLN
500.0000 [IU] | Freq: Once | INTRAVENOUS | Status: AC | PRN
  Administered 2019-05-26: 16:00:00 500 [IU]
  Filled 2019-05-26: qty 5

## 2019-05-26 MED ORDER — SODIUM CHLORIDE 0.9% FLUSH
10.0000 mL | INTRAVENOUS | Status: DC | PRN
  Administered 2019-05-26: 10 mL
  Filled 2019-05-26: qty 10

## 2019-05-27 ENCOUNTER — Encounter: Payer: Self-pay | Admitting: Hematology

## 2019-06-07 ENCOUNTER — Inpatient Hospital Stay: Payer: Medicaid Other

## 2019-06-07 ENCOUNTER — Other Ambulatory Visit: Payer: Self-pay

## 2019-06-07 VITALS — BP 120/90 | HR 89 | Temp 98.3°F | Resp 18

## 2019-06-07 DIAGNOSIS — Z5111 Encounter for antineoplastic chemotherapy: Secondary | ICD-10-CM

## 2019-06-07 DIAGNOSIS — Z7189 Other specified counseling: Secondary | ICD-10-CM

## 2019-06-07 DIAGNOSIS — C189 Malignant neoplasm of colon, unspecified: Secondary | ICD-10-CM

## 2019-06-07 DIAGNOSIS — D509 Iron deficiency anemia, unspecified: Secondary | ICD-10-CM

## 2019-06-07 DIAGNOSIS — Z95828 Presence of other vascular implants and grafts: Secondary | ICD-10-CM

## 2019-06-07 DIAGNOSIS — Z452 Encounter for adjustment and management of vascular access device: Secondary | ICD-10-CM | POA: Diagnosis not present

## 2019-06-07 LAB — CMP (CANCER CENTER ONLY)
ALT: 11 U/L (ref 0–44)
AST: 17 U/L (ref 15–41)
Albumin: 2.8 g/dL — ABNORMAL LOW (ref 3.5–5.0)
Alkaline Phosphatase: 91 U/L (ref 38–126)
Anion gap: 9 (ref 5–15)
BUN: 14 mg/dL (ref 6–20)
CO2: 25 mmol/L (ref 22–32)
Calcium: 8.8 mg/dL — ABNORMAL LOW (ref 8.9–10.3)
Chloride: 110 mmol/L (ref 98–111)
Creatinine: 0.81 mg/dL (ref 0.44–1.00)
GFR, Est AFR Am: >60 mL/min (ref >60)
GFR, Estimated: >60 mL/min (ref >60)
Glucose, Bld: 99 mg/dL (ref 70–99)
Potassium: 4.2 mmol/L (ref 3.5–5.1)
Sodium: 144 mmol/L (ref 135–145)
Total Bilirubin: 0.3 mg/dL (ref 0.3–1.2)
Total Protein: 7.4 g/dL (ref 6.5–8.1)

## 2019-06-07 LAB — CBC WITH DIFFERENTIAL/PLATELET
Abs Immature Granulocytes: 0.01 10*3/uL (ref 0.00–0.07)
Basophils Absolute: 0 10*3/uL (ref 0.0–0.1)
Basophils Relative: 0 %
Eosinophils Absolute: 0.1 10*3/uL (ref 0.0–0.5)
Eosinophils Relative: 2 %
HCT: 33.1 % — ABNORMAL LOW (ref 36.0–46.0)
Hemoglobin: 9.8 g/dL — ABNORMAL LOW (ref 12.0–15.0)
Immature Granulocytes: 0 %
Lymphocytes Relative: 42 %
Lymphs Abs: 1.7 10*3/uL (ref 0.7–4.0)
MCH: 27.3 pg (ref 26.0–34.0)
MCHC: 29.6 g/dL — ABNORMAL LOW (ref 30.0–36.0)
MCV: 92.2 fL (ref 80.0–100.0)
Monocytes Absolute: 0.5 10*3/uL (ref 0.1–1.0)
Monocytes Relative: 12 %
Neutro Abs: 1.7 10*3/uL (ref 1.7–7.7)
Neutrophils Relative %: 44 %
Platelets: 222 10*3/uL (ref 150–400)
RBC: 3.59 MIL/uL — ABNORMAL LOW (ref 3.87–5.11)
RDW: 15.9 % — ABNORMAL HIGH (ref 11.5–15.5)
WBC: 3.9 10*3/uL — ABNORMAL LOW (ref 4.0–10.5)
nRBC: 0 % (ref 0.0–0.2)

## 2019-06-07 LAB — TOTAL PROTEIN, URINE DIPSTICK: Protein, ur: NEGATIVE mg/dL

## 2019-06-07 MED ORDER — DEXAMETHASONE SODIUM PHOSPHATE 10 MG/ML IJ SOLN
INTRAMUSCULAR | Status: AC
  Filled 2019-06-07: qty 1

## 2019-06-07 MED ORDER — DEXAMETHASONE SODIUM PHOSPHATE 10 MG/ML IJ SOLN
10.0000 mg | Freq: Once | INTRAMUSCULAR | Status: AC
  Administered 2019-06-07: 09:00:00 10 mg via INTRAVENOUS

## 2019-06-07 MED ORDER — LEUCOVORIN CALCIUM INJECTION 350 MG
400.0000 mg/m2 | Freq: Once | INTRAVENOUS | Status: AC
  Administered 2019-06-07: 10:00:00 1156 mg via INTRAVENOUS
  Filled 2019-06-07: qty 57.8

## 2019-06-07 MED ORDER — SODIUM CHLORIDE 0.9 % IV SOLN
2400.0000 mg/m2 | INTRAVENOUS | Status: DC
  Administered 2019-06-07: 14:00:00 6950 mg via INTRAVENOUS
  Filled 2019-06-07: qty 139

## 2019-06-07 MED ORDER — PALONOSETRON HCL INJECTION 0.25 MG/5ML
0.2500 mg | Freq: Once | INTRAVENOUS | Status: AC
  Administered 2019-06-07: 0.25 mg via INTRAVENOUS

## 2019-06-07 MED ORDER — PALONOSETRON HCL INJECTION 0.25 MG/5ML
INTRAVENOUS | Status: AC
  Filled 2019-06-07: qty 5

## 2019-06-07 MED ORDER — DEXTROSE 5 % IV SOLN
Freq: Once | INTRAVENOUS | Status: AC
  Administered 2019-06-07: 09:00:00 via INTRAVENOUS
  Filled 2019-06-07: qty 250

## 2019-06-07 MED ORDER — OXALIPLATIN CHEMO INJECTION 100 MG/20ML
86.0000 mg/m2 | Freq: Once | INTRAVENOUS | Status: AC
  Administered 2019-06-07: 10:00:00 250 mg via INTRAVENOUS
  Filled 2019-06-07: qty 40

## 2019-06-07 MED ORDER — SODIUM CHLORIDE 0.9% FLUSH
10.0000 mL | Freq: Once | INTRAVENOUS | Status: AC
  Administered 2019-06-07: 09:00:00 10 mL
  Filled 2019-06-07: qty 10

## 2019-06-07 NOTE — Patient Instructions (Signed)
[facility] Discharge Instructions for Patients Receiving Chemotherapy  Today you received the following chemotherapy agents: Oxaliplatin, Leucovorin, and 5FU.  To help prevent nausea and vomiting after your treatment, we encourage you to take your nausea medication as directed.   If you develop nausea and vomiting that is not controlled by your nausea medication, call the clinic.   BELOW ARE SYMPTOMS THAT SHOULD BE REPORTED IMMEDIATELY:  *FEVER GREATER THAN 100.5 F  *CHILLS WITH OR WITHOUT FEVER  NAUSEA AND VOMITING THAT IS NOT CONTROLLED WITH YOUR NAUSEA MEDICATION  *UNUSUAL SHORTNESS OF BREATH  *UNUSUAL BRUISING OR BLEEDING  TENDERNESS IN MOUTH AND THROAT WITH OR WITHOUT PRESENCE OF ULCERS  *URINARY PROBLEMS  *BOWEL PROBLEMS  UNUSUAL RASH Items with * indicate a potential emergency and should be followed up as soon as possible.  Feel free to call the clinic should you have any questions or concerns. The clinic phone number is [phone].  Please show the CHEMO ALERT CARD at check-in to the Emergency Department and triage nurse.    

## 2019-06-08 ENCOUNTER — Other Ambulatory Visit: Payer: Self-pay | Admitting: Hematology

## 2019-06-08 ENCOUNTER — Encounter: Payer: Self-pay | Admitting: Hematology

## 2019-06-08 MED ORDER — PROMETHAZINE HCL 25 MG PO TABS: 25.0000 mg | ORAL_TABLET | Freq: Four times a day (QID) | ORAL | 0 refills | Status: AC | PRN

## 2019-06-09 ENCOUNTER — Other Ambulatory Visit: Payer: Self-pay

## 2019-06-09 ENCOUNTER — Inpatient Hospital Stay: Payer: Medicaid Other

## 2019-06-09 VITALS — BP 122/78 | HR 82 | Temp 98.2°F | Resp 18

## 2019-06-09 DIAGNOSIS — Z452 Encounter for adjustment and management of vascular access device: Secondary | ICD-10-CM | POA: Diagnosis not present

## 2019-06-09 DIAGNOSIS — Z7189 Other specified counseling: Secondary | ICD-10-CM

## 2019-06-09 DIAGNOSIS — C189 Malignant neoplasm of colon, unspecified: Secondary | ICD-10-CM

## 2019-06-09 MED ORDER — HEPARIN SOD (PORK) LOCK FLUSH 100 UNIT/ML IV SOLN
500.0000 [IU] | Freq: Once | INTRAVENOUS | Status: AC | PRN
  Administered 2019-06-09: 12:00:00 500 [IU]
  Filled 2019-06-09: qty 5

## 2019-06-09 MED ORDER — SODIUM CHLORIDE 0.9% FLUSH
10.0000 mL | INTRAVENOUS | Status: DC | PRN
  Administered 2019-06-09: 10 mL
  Filled 2019-06-09: qty 10

## 2019-06-14 IMAGING — US US BIOPSY CORE LIVER
1 series · 13 of 24 positions shown · non-contrast
Comparison: none

CLINICAL DATA: Hypermetabolic liver lesions and cecal mass.

[Series 1: us biopsy core liver · 13 of 24 slices shown]
[im 1/24]
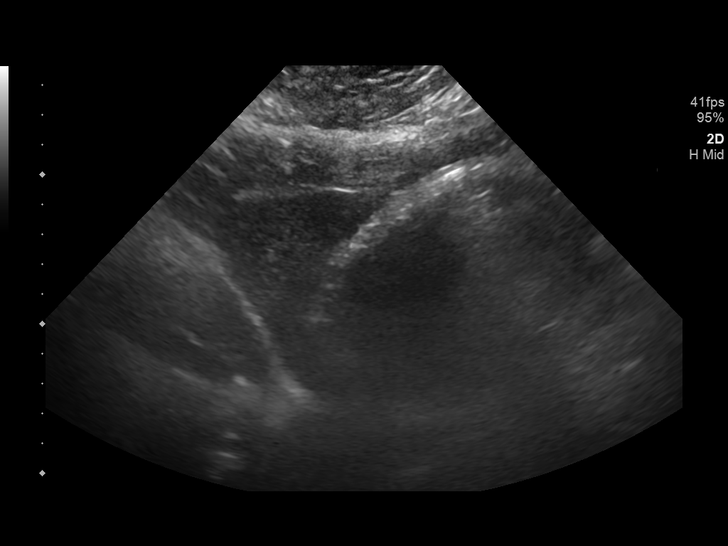
[im 3/24]
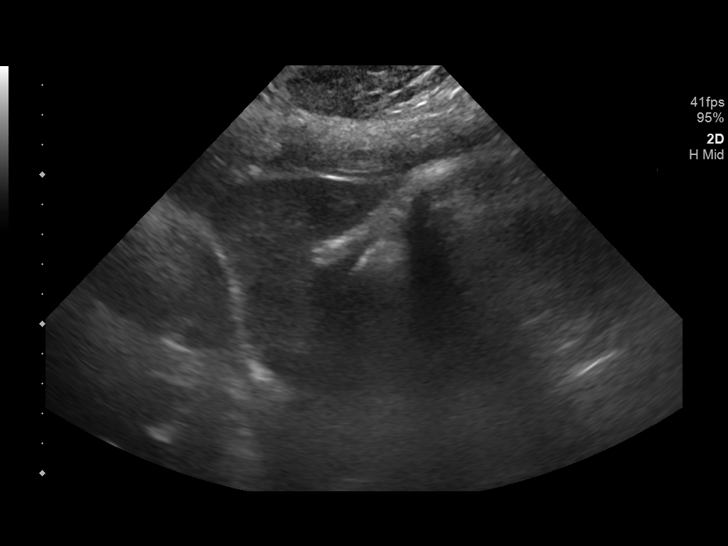
[im 5/24]
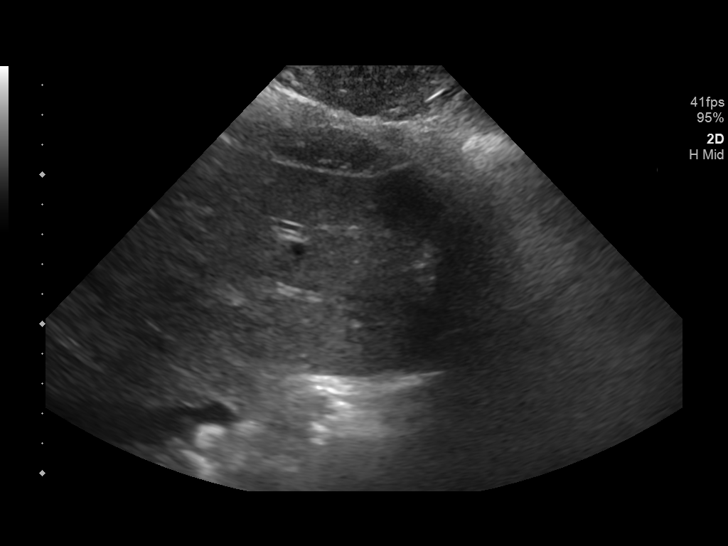
[im 7/24]
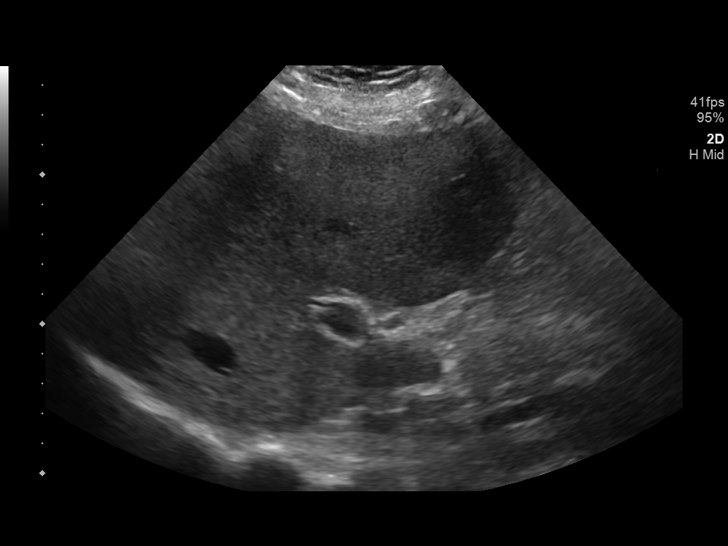
[im 9/24]
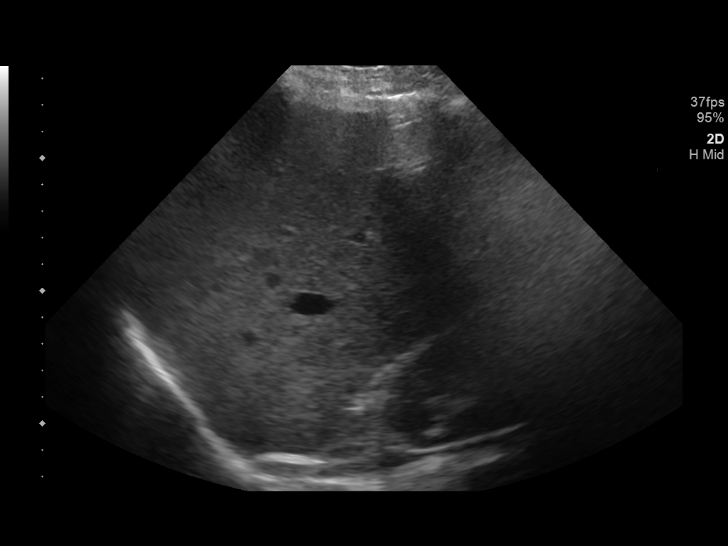
[im 11/24]
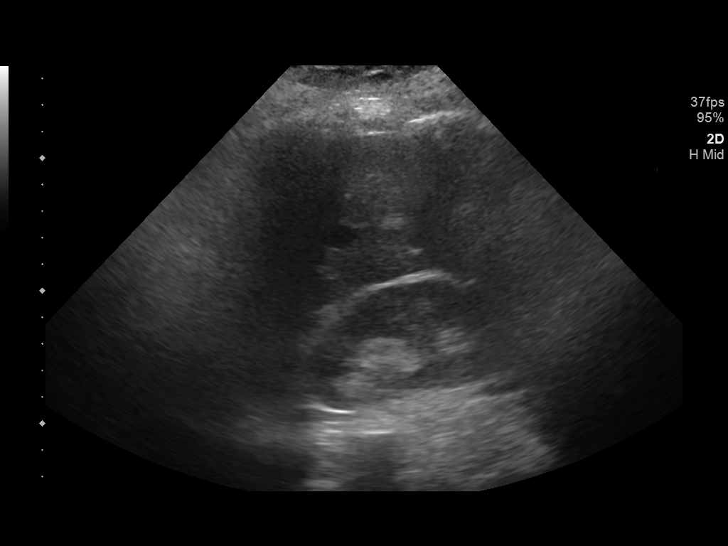
[im 13/24]
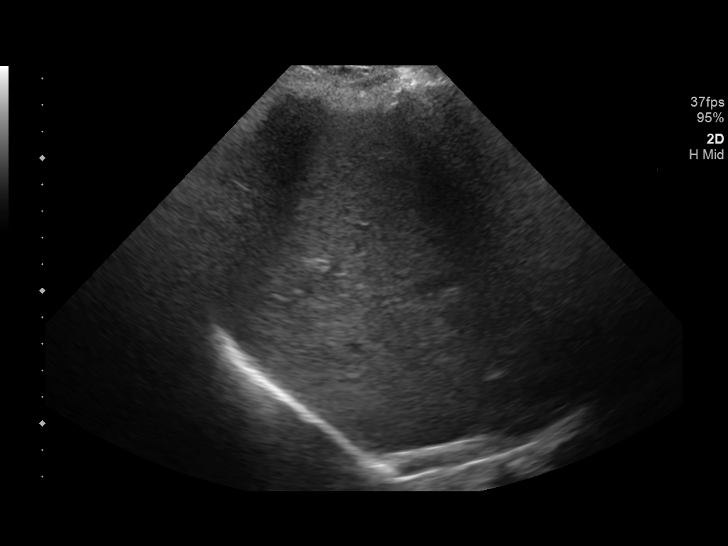
[im 14/24]
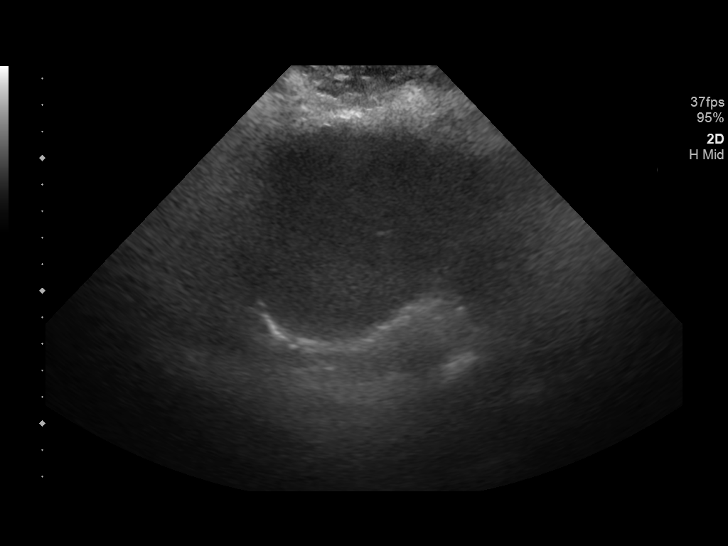
[im 16/24]
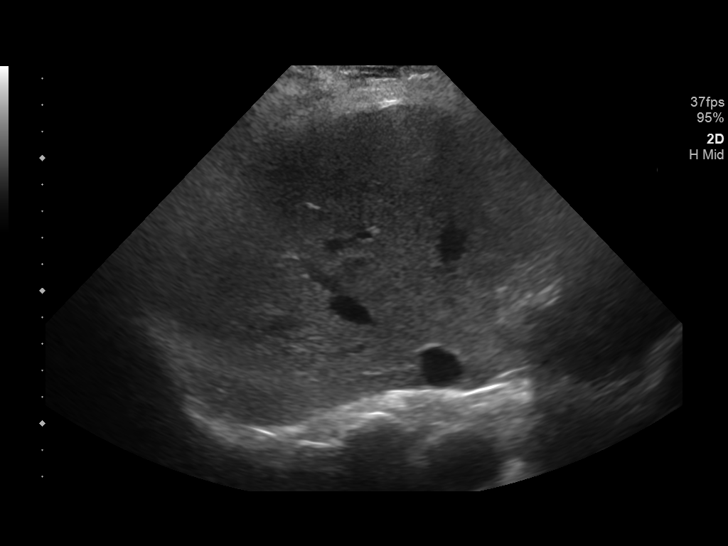
[im 18/24]
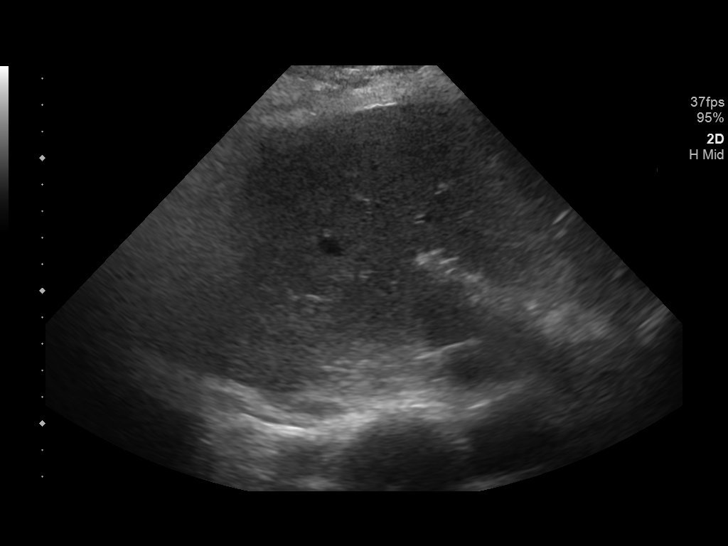
[im 20/24]
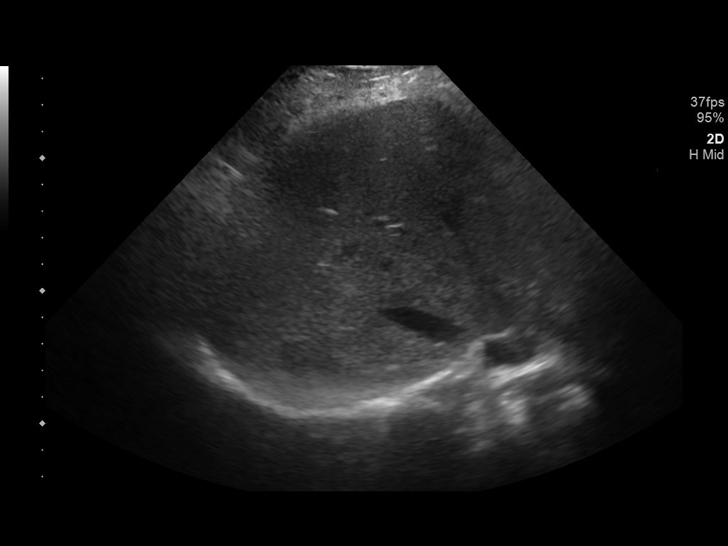
[im 22/24]
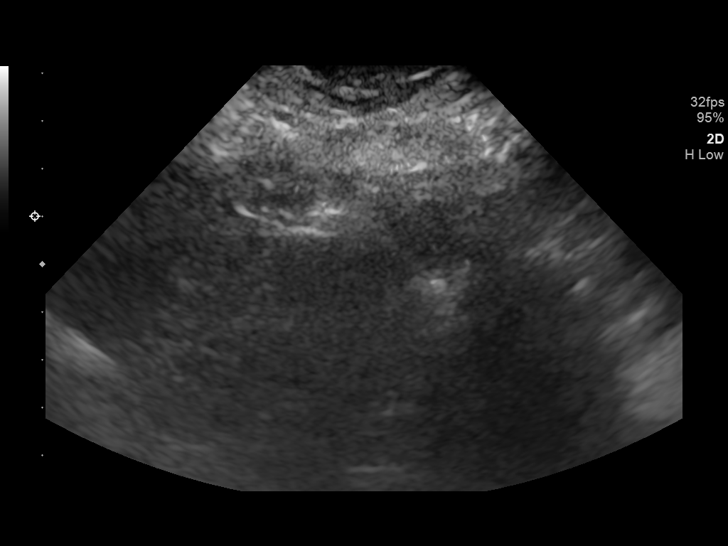
[im 24/24]
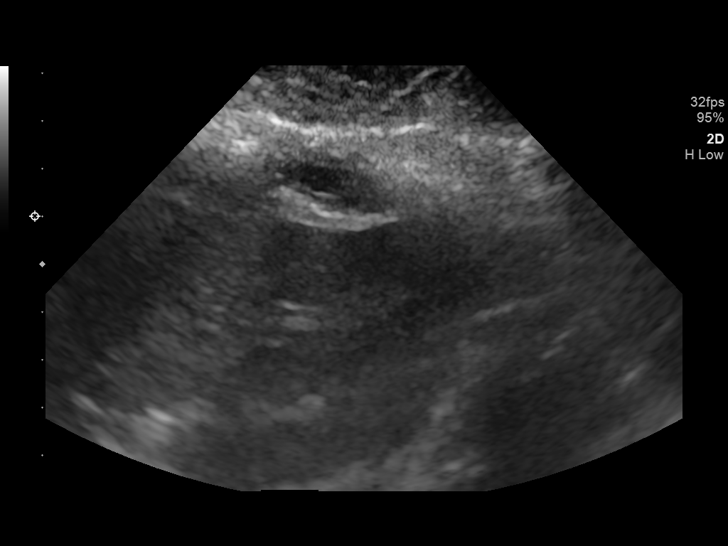

[13 of 24 positions shown; findings below may reference images not displayed]

EXAM:
ULTRASOUND GUIDED CORE BIOPSY OF LIVER

MEDICATIONS:
2.0 mg IV Versed; 100 mcg IV Fentanyl

Total Moderate Sedation Time: 15 minutes.

The patient's level of consciousness and physiologic status were
continuously monitored during the procedure by Radiology nursing.

PROCEDURE:
The procedure, risks, benefits, and alternatives were explained to
the patient. Questions regarding the procedure were encouraged and
answered. The patient understands and consents to the procedure. A
time out was performed prior to initiating the procedure.

Ultrasound was performed of the liver. The abdominal wall was
prepped with chlorhexidine in a sterile fashion, and a sterile drape
was applied covering the operative field. A sterile gown and sterile
gloves were used for the procedure. Local anesthesia was provided
with 1% Lidocaine.

A 17 gauge trocar needle was advanced under ultrasound guidance into
the left lobe of the liver. Two separate coaxial 18 gauge core
biopsy samples were obtained and submitted in formalin. The outer
needle was removed and additional ultrasound performed.

COMPLICATIONS:
None.
FINDINGS: It is very difficult to identify any discrete focal lesions in the
liver by ultrasound due to the patient's body habitus. It was felt
that the lesion in the tip of the lateral segment of the left lobe
of the liver was able to be vaguely marginated and this area was
sampled under ultrasound guidance. Delineation was still relatively
poor and there may definitely be some sampling error based on poor
visualization.
IMPRESSION: Poor visualization of liver lesions by ultrasound due to the
patient's body habitus. The lesion in the tip of the lateral segment
of the left lobe was able to be vaguely identified and was sampled.
Risk of sampling error based on poor visualization is felt to be
higher than typical for image guided biopsy.

## 2019-06-15 ENCOUNTER — Ambulatory Visit (HOSPITAL_COMMUNITY)
Admission: RE | Admit: 2019-06-15 | Discharge: 2019-06-15 | Disposition: A | Discharge status: Home / Self Care | Payer: Medicaid Other | Source: Ambulatory Visit | Attending: Hematology | Admitting: Hematology

## 2019-06-15 ENCOUNTER — Other Ambulatory Visit: Payer: Self-pay

## 2019-06-15 ENCOUNTER — Encounter (HOSPITAL_COMMUNITY): Payer: Self-pay

## 2019-06-15 DIAGNOSIS — C787 Secondary malignant neoplasm of liver and intrahepatic bile duct: Secondary | ICD-10-CM | POA: Insufficient documentation

## 2019-06-15 DIAGNOSIS — C189 Malignant neoplasm of colon, unspecified: Secondary | ICD-10-CM | POA: Diagnosis not present

## 2019-06-15 DIAGNOSIS — Z5111 Encounter for antineoplastic chemotherapy: Secondary | ICD-10-CM

## 2019-06-15 HISTORY — DX: Malignant (primary) neoplasm, unspecified: C80.1

## 2019-06-15 MED ORDER — SODIUM CHLORIDE (PF) 0.9 % IJ SOLN
INTRAMUSCULAR | Status: AC
  Filled 2019-06-15: qty 50

## 2019-06-15 MED ORDER — IOHEXOL 300 MG/ML  SOLN
30.0000 mL | Freq: Once | INTRAMUSCULAR | Status: AC | PRN
  Administered 2019-06-15: 30 mL via ORAL

## 2019-06-15 MED ORDER — IOHEXOL 300 MG/ML  SOLN
100.0000 mL | Freq: Once | INTRAMUSCULAR | Status: AC | PRN
  Administered 2019-06-15: 13:00:00 100 mL via INTRAVENOUS

## 2019-06-20 NOTE — Progress Notes (Signed)
HEMATOLOGY/ONCOLOGY CLINIC NOTE  Date of Service: 06/21/2019  Patient Care Team: Nolene Ebbs, MD as PCP - General (Internal Medicine) Jackelyn Knife, MD as Rounding Team (Internal Medicine)   CHIEF COMPLAINTS/PURPOSE OF CONSULTATION:  Recently diagnosed Metastatic Adenocarcinoma of colon   HISTORY OF PRESENTING ILLNESS:  Margaret [name] is a wonderful 48 y.o. female who has been referred to [place] by Dr. Nolene Ebbs for evaluation and management of Iron deficiency anemia. The pt reports that she is doing well overall.   The pt recently presented to the ED on 04/16/18 for right sided abdominal pain that was evaluated with a CT A/P which revealed liver and pancreatic cyst, recommended for outpatient MRI follow up. The pt was found to have a UTI, was treated with Rocephin and discharged with Keflex. Prior to this the pt was admitted on 04/09/18 for symptomatic anemia, presenting with HGB at 6.2, received 2 units PRBCs, one IV Iron infusion, and was encouraged to seek out GI as outpatient.  She has an appointment with [facility] on 05/19/18.   The pt reports that she is still feeling dizzy and light headed and denies feeling better after her recent blood transfusion. She notes that she dizziness presents with a feeling of warmness and that she begins hyperventilating, feeling nervous about her dizziness.  She notes that prior to her recent hospital stay, she never required a blood transfusion or IV iron replacement. She notes that as a child she had frequent nose bleeds, and was diagnosed with anemia. She also notes a history of ice picca.   She had a hysterectomy in 2012, and prior to this she had very heavy periods that began at age 53 and occurred almost constantly. She was on Depo and Mirena which did not help slow menstrual losses. Besides taking iron supplements while pregnant she has not taken PO iron replacement as she reports not tolerating it very well while pregnant.   She denies  concern for black stools or blood in the stools and has not had a colonoscopy or endoscopy before. The pt notes that she has not previously had a concern for stomach ulcers, but has acid reflux and takes Prevacid as needed for 7-8 years. She notes that she has not recently used Prevacid, for the last 3 months.  She is not taking vitamin replacements and denies any dietary restrictions.  The pt notes that she has been continuing to have lower right quadrant abdominal pain that radiates across her abdomen, presents for up to 45 minutes and occurs intermittently. She notes that her abdominal pain presented on 04/13/18.   The pt notes that was taking Ibuprofen 817m BID for 8 years to address her back pain related to her herniated disk. She stopped taking this 4 months ago.   Of note prior to the patient's visit today, pt has had CT A/P completed on 04/16/18 with results revealing No acute findings in the abdomen/pelvis. Least 4 low-density hepatic lesions, some of which may represent small cysts or hemangiomas, however there is a 19 mm lesion in the left lobe of the liver that is incompletely characterized. Recommend nonemergent hepatic protocol MRI for further evaluation..Marland KitchenPossible 11 mm pancreatic nodule rising from the tail, alternatively this may represent a splenule. This can also be assessed at time of hepatic MRI. Gallstone without gallbladder inflammation. Colonic diverticulosis without diverticulitis.   Most recent lab results (04/16/18) of CBC is as follows: all values are WNL except for HGB at 9.5, HCT  at 33.6, MCH at 23.8, MCHC at 28.3, RDW at 23.2, PLT at 435k. Ferritin 04/09/18 was low at 6 Vitamin B12 on 04/09/18 was at 196  On review of systems, pt reports light headedness, dizziness, lower back pain, intermittent abdominal pain, and denies black stools, blood in the stools, tingling or numbness in her legs or arms, vaginal bleeding, and any other symptoms.   INTERVAL HISTORY:   Margaret [name] returns today for management, evaluation, and [id number] FOLFOX  treatment of her newly diagnosed Metastatic Adenocarcinoma of GI primary. The patient's last visit with [place] was on 05/24/2019. The pt reports that she is doing well overall.  The pt reports that she went to see Dr. Alessandra Bevels yesterday and became upset after he told her that the tumor in her colon can not be removed. Pt is having issues eating due to her taste buds being "off" but has been trying to keep her nutrition up by eating fruit. She is still having trouble with her right knee which is currently swollen. She has been using her cane to help her get around. Pt notes that her son has necessary surgery at [facility] in November that she has to take him to. The family that could support her live in [place]. She is under a lot of stress due to the progression of her disease. She is concerned about chemotherapy side effects as well as taking care of her children.  Of note since the patient's last visit, pt has had CT C/A/P ([id number]) ([id number]) completed on 06/15/2019 with results revealing "1. Significant interval enlargement of ill-defined, hypodense masses of the liver, the largest index mass of the inferior right lobe, segment VI, measuring 4.1 x 3.7 cm, previously 1.0 cm when measured similarly (series 2, image 58). There are additional lesions in caudate (series 2, image 54), lateral right lobe (series 2, image 52) and liver dome (series 2, image 39), all substantially enlarged compared to prior examination. Findings are consistent with worsened metastatic disease. 2. Redemonstrated residual mass at the cecal base appears thickened in comparison to prior examination, approximately 1.2 cm, previously 5 mm when measured similarly (series 4, image 61, series 2, image 110). 3. Stable 2 mm nodules of the left lung base (series 6, image 105, 116), likely benign and incidental. No definite evidence of metastatic disease in the chest.  Attention on follow-up."1. Significant interval enlargement of ill-defined, hypodense masses of the liver, the largest index mass of the inferior right lobe, segment VI, measuring 4.1 x 3.7 cm, previously 1.0 cm when measured similarly (series 2, image 58). There are additional lesions in caudate (series 2, image 54), lateral right lobe (series 2, image 52) and liver dome (series 2, image 39), all substantially enlarged compared to prior examination. Findings are consistent with worsened metastatic disease. 2. Redemonstrated residual mass at the cecal base appears thickened in comparison to prior examination, approximately 1.2 cm, previously 5 mm when measured similarly (series 4, image 61, series 2, image 110). 3. Stable 2 mm nodules of the left lung base (series 6, image 105, 116), likely benign and incidental. No definite evidence of metastatic disease in the chest. Attention on follow-up."  Lab results today (06/21/19) of CBC w/diff and CMP is as follows: all values are WNL except for WBC at 3.6K, RBC at 3.48, Hgb at 9.7, HCT at 32.1, RDW at 16.0, Albumin at 2.6, Total Bilirubin at 0.2.  On review of systems, pt reports right knee pain/swelling and denies abdominal pain, fevers,  chills, unexpected weight loss, bowel movement changes and any other symptoms.   MEDICAL HISTORY:  Past Medical History:  Diagnosis Date   Allergic rhinitis    Anxiety diagnosed in 1990   Arthritis    Asthma    colon ca with liver mets dx'd 08/2018   Dyspnea    with low blood count hx of anemia   Eczema    History of blood transfusion    Hypertension    Lower back pain    Migraine    Sleep apnea    uses CPAP    SURGICAL HISTORY: Past Surgical History:  Procedure Laterality Date   ABDOMINAL HYSTERECTOMY     cortisone injections     knees bilat and back    IR IMAGING GUIDED PORT INSERTION  09/14/2018   iron infusion     LIVER BIOPSY      SOCIAL HISTORY: Social History   Socioeconomic  History   Marital status: Single    Spouse name: Not on file   Number of children: Not on file   Years of education: Not on file   Highest education level: Not on file  Occupational History   Not on file  Social Needs   Financial resource strain: Not on file   Food insecurity    Worry: Not on file    Inability: Not on file   Transportation needs    Medical: Not on file    Non-medical: Not on file  Tobacco Use   Smoking status: Former Smoker    Quit date: 07/30/1999    Years since quitting: 19.9   Smokeless tobacco: Never Used  Substance and Sexual Activity   Alcohol use: No   Drug use: No   Sexual activity: Not on file  Lifestyle   Physical activity    Days per week: Not on file    Minutes per session: Not on file   Stress: Not on file  Relationships   Social connections    Talks on phone: Not on file    Gets together: Not on file    Attends religious service: Not on file    Active member of club or organization: Not on file    Attends meetings of clubs or organizations: Not on file    Relationship status: Not on file   Intimate partner violence    Fear of current or ex partner: Not on file    Emotionally abused: Not on file    Physically abused: Not on file    Forced sexual activity: Not on file  Other Topics Concern   Not on file  Social History Narrative   Not on file    FAMILY HISTORY: No family history on file.  ALLERGIES:  is allergic to latex; tape; and tylenol with codeine #3 [acetaminophen-codeine].  MEDICATIONS:  Current Outpatient Medications  Medication Sig Dispense Refill   albuterol (PROAIR HFA) 108 (90 Base) MCG/ACT inhaler INHALE TWO puffs BY MOUTH into the lungs EVERY 4 HOURS AS NEEDED FOR WHEEZING OR shortness of breath 18 g 1   Boric Acid 4 % SOLN Place 1 capsule vaginally every Friday.      budesonide-formoterol (SYMBICORT) 160-4.5 MCG/ACT inhaler Inhale 2 puffs into the lungs 2 (two) times a day. 10.2 g 5    cetirizine (ZYRTEC) 10 MG tablet Take 1 tablet (10 mg total) by mouth daily. 30 tablet 5   clobetasol ointment (TEMOVATE) 1.60 % Apply 1 application topically 2 (two) times daily. Use for Lichen Sclerosis  Cyanocobalamin (B-12) 1000 MCG SUBL Place 1,000 mcg under the tongue daily. 30 each 3   dexamethasone (DECADRON) 4 MG tablet TAKE 2 TABLETS (8 MG TOTAL) BY MOUTH DAILY  START THE DAY AFTER CHEMOTHERAPY FOR 2 DAYS. TAKE WITH FOOD. 30 tablet 1   diazepam (VALIUM) 5 MG tablet Take 1 tablet (5 mg total) by mouth daily as needed for anxiety (panic attacks). 30 tablet 0   diclofenac sodium (VOLTAREN) 1 % GEL Apply 2 g topically 4 (four) times daily as needed (for back/knee pain.).      dicyclomine (BENTYL) 20 MG tablet Take 1 tablet (20 mg total) by mouth 2 (two) times daily. (Patient taking differently: Take 20 mg by mouth 3 (three) times daily before meals. ) 20 tablet 0   dronabinol (MARINOL) 5 MG capsule Take 1 capsule (5 mg total) by mouth 2 (two) times daily before a meal. 60 capsule 0   ergocalciferol (VITAMIN D2) 50000 UNITS capsule Take 50,000 Units by mouth every Monday.      lidocaine-prilocaine (EMLA) cream Apply to affected area once 30 g 3   lisinopril-hydrochlorothiazide (PRINZIDE,ZESTORETIC) 20-12.5 MG tablet Take 1 tablet by mouth daily.     methocarbamol (ROBAXIN) 750 MG tablet Take 750 mg by mouth every 8 (eight) hours as needed (back spasms.).      montelukast (SINGULAIR) 10 MG tablet Take 1 tablet (10 mg total) by mouth at bedtime. 30 tablet 6   nystatin-triamcinolone (MYCOLOG II) cream Apply 1 application topically 2 (two) times daily as needed (irritation). Use in skin folds and under breast      Olopatadine HCl (PAZEO) 0.7 % SOLN Apply 1 drop to eye daily. 2.5 mL 5   ondansetron (ZOFRAN) 8 MG tablet Take 1 tablet (8 mg total) by mouth 2 (two) times daily as needed for refractory nausea / vomiting. Start on day 3 after chemotherapy. 30 tablet 0   pantoprazole  (PROTONIX) 40 MG tablet Take 1 tablet (40 mg total) by mouth daily before breakfast. 30 tablet 1   pimecrolimus (ELIDEL) 1 % cream Apply 1 application topically 2 (two) times daily. 30 g 6   prochlorperazine (COMPAZINE) 10 MG tablet Take 1 tablet (10 mg total) by mouth every 6 (six) hours as needed (Nausea or vomiting). 30 tablet 1   saccharomyces boulardii (FLORASTOR) 250 MG capsule Take 1 capsule (250 mg total) by mouth 2 (two) times daily.     traMADol (ULTRAM) 50 MG tablet Take 1-2 tablets (50-100 mg total) by mouth every 6 (six) hours as needed for moderate pain or severe pain. 90 tablet 0   triamcinolone (NASACORT) 55 MCG/ACT AERO nasal inhaler Place 2 sprays into the nose daily as needed (for allergies). 1 Inhaler 5   valACYclovir (VALTREX) 500 MG tablet Take 500 mg by mouth 2 (two) times daily as needed (for cold sores).      No current facility-administered medications for this visit.    Facility-Administered Medications Ordered in Other Visits  Medication Dose Route Frequency Provider Last Rate Last Dose   fluorouracil (ADRUCIL) 6,950 mg in sodium chloride 0.9 % 111 mL chemo infusion  2,400 mg/m2 (Treatment Plan Recorded) Intravenous 1 day or 1 dose Brunetta Genera, MD       leucovorin 1,156 mg in dextrose 5 % 250 mL infusion  400 mg/m2 (Treatment Plan Recorded) Intravenous Once Brunetta Genera, MD 77 mL/hr at 05/24/19 1242 1,156 mg at 05/24/19 1242   oxaliplatin (ELOXATIN) 250 mg in dextrose 5 % 500 mL  chemo infusion  250 mg Intravenous Once Brunetta Genera, MD 138 mL/hr at 05/24/19 1245 250 mg at 05/24/19 1245    REVIEW OF SYSTEMS:    A 10+ POINT REVIEW OF SYSTEMS WAS OBTAINED including neurology, dermatology, psychiatry, cardiac, respiratory, lymph, extremities, GI, GU, Musculoskeletal, constitutional, breasts, reproductive, HEENT.  All pertinent positives are noted in the HPI.  All others are negative.   PHYSICAL EXAMINATION:  Vitals:   06/21/19 0950  BP:  (!) 170/78  Pulse: 79  Resp: 18  Temp: 98.2 F (36.8 C)  SpO2: 100%    Wt Readings from Last 3 Encounters:  06/21/19 (!) 402 lb 14.4 oz (182.8 kg)  05/24/19 (!) 407 lb 9.6 oz (184.9 kg)  04/18/19 (!) 398 lb (180.5 kg)   Body mass index is 57.81 kg/m.  Exam was given in a chair   GENERAL:alert, in no acute distress and comfortable SKIN: no acute rashes, no significant lesions EYES: conjunctiva are pink and non-injected, sclera anicteric OROPHARYNX: MMM, no exudates, no oropharyngeal erythema or ulceration NECK: supple, no JVD LYMPH:  no palpable lymphadenopathy in the cervical, axillary or inguinal regions LUNGS: clear to auscultation b/l with normal respiratory effort HEART: regular rate & rhythm ABDOMEN:  normoactive bowel sounds , non tender, not distended. No palpable hepatosplenomegaly.  Extremity: no pedal edema PSYCH: alert & oriented x 3 with fluent speech NEURO: no focal motor/sensory deficits   LABORATORY DATA:  I have reviewed the data as listed  . CBC Latest Ref Rng & Units 06/21/2019 06/07/2019 05/24/2019  WBC 4.0 - 10.5 K/uL 3.6(L) 3.9(L) 5.4  Hemoglobin 12.0 - 15.0 g/dL 9.7(L) 9.8(L) 9.8(L)  Hematocrit 36.0 - 46.0 % 32.1(L) 33.1(L) 32.8(L)  Platelets 150 - 400 K/uL 241 222 265   ANC 900 . CMP Latest Ref Rng & Units 06/21/2019 06/07/2019 05/24/2019  Glucose 70 - 99 mg/dL 95 99 92  BUN 6 - 20 mg/dL _0 Creatinine 0.44 - 1.00 mg/dL 0.80 0.81 0.89  Sodium 135 - 145 mmol/L 144 144 142  Potassium 3.5 - 5.1 mmol/L 4.0 4.2 4.1  Chloride 98 - 111 mmol/L 111 110 110  CO2 22 - 32 mmol/L _1 Calcium 8.9 - 10.3 mg/dL 8.9 8.8(L) 8.9  Total Protein 6.5 - 8.1 g/dL 7.2 7.4 7.5  Total Bilirubin 0.3 - 1.2 mg/dL 0.2(L) 0.3 0.3  Alkaline Phos 38 - 126 U/L 86 91 75  AST 15 - 41 U/L _2 ALT 0 - 44 U/L _3 . Lab Results  Component Value Date   IRON 24 (L) 09/06/2018   TIBC 156 (L) 09/06/2018   IRONPCTSAT 15 (L) 09/06/2018   (Iron and TIBC)  Lab  Results  Component Value Date   FERRITIN 540 (H) 11/03/2018   B12  - 196--> 584  07/22/18 Liver Biopsy:   08/27/18 Repeat Liver Biopsy:   Molecular Pathology:     RADIOGRAPHIC STUDIES: I have personally reviewed the radiological images as listed and agreed with the findings in the report. Ct Chest W Contrast  Result Date: 06/15/2019 CLINICAL DATA:  Metastatic colon cancer, assess treatment response EXAM: CT CHEST, ABDOMEN, AND PELVIS WITH CONTRAST TECHNIQUE: Multidetector CT imaging of the chest, abdomen and pelvis was performed following the standard protocol during bolus administration of intravenous contrast. CONTRAST:  139m OMNIPAQUE IOHEXOL 300 MG/ML SOLN, additional oral enteric contrast COMPARISON:  CT abdomen pelvis, 12/13/2018, 08/27/2018, PET-CT, 07/16/2018, thyroid ultrasound, 08/20/2018 FINDINGS: CT CHEST FINDINGS Cardiovascular:  Right chest port catheter. Normal heart size. No pericardial effusion. Mediastinum/Nodes: No enlarged mediastinal, hilar, or axillary lymph nodes. Redemonstrated hypodense 2.4 cm nodule of the right lobe of the thyroid (series 2, image 6). Trachea, and esophagus demonstrate no significant findings. Lungs/Pleura: Unchanged bandlike scarring of the right lung base. Stable 2 mm nodules of the left lung base (series 6, image 105, 116). No pleural effusion or pneumothorax. Musculoskeletal: No chest wall mass or suspicious bone lesions identified. CT ABDOMEN PELVIS FINDINGS Hepatobiliary: Significant interval enlargement of ill-defined, hypodense masses of the liver, the largest index mass of the inferior right lobe, segment VI, measuring 4.1 x 3.7 cm, previously 1.0 cm when measured similarly (series 2, image 58). There are additional lesions in caudate (series 2, image 54), lateral right lobe (series 2, image 52) and liver dome (series 2, image 39), all substantially enlarged compared to prior examination. Gallstone near the gallbladder neck. No gallbladder wall  thickening, or biliary dilatation. Pancreas: Unremarkable. No pancreatic ductal dilatation or surrounding inflammatory changes. Spleen: Normal in size without significant abnormality. Adrenals/Urinary Tract: Adrenal glands are unremarkable. Fluid attenuation cyst of the posterosuperior pole of the right kidney. Kidneys are otherwise normal, without renal calculi, solid lesion, or hydronephrosis. Bladder is unremarkable. Stomach/Bowel: Stomach is within normal limits. Appendix appears normal. Redemonstrated residual mass at the cecal base appears thickened in comparison to prior examination, approximately 1.2 cm, previously 5 mm when measured similarly (series 4, image 61, series 2, image 110). Sigmoid diverticulosis. Vascular/Lymphatic: No significant vascular findings are present. No enlarged abdominal or pelvic lymph nodes. Reproductive: Status post hysterectomy. Other: No abdominal wall hernia or abnormality. No abdominopelvic ascites. Musculoskeletal: No acute or significant osseous findings. Unchanged benign sclerotic bone island of the right iliac wing (series 2, image 109). IMPRESSION: 1. Significant interval enlargement of ill-defined, hypodense masses of the liver, the largest index mass of the inferior right lobe, segment VI, measuring 4.1 x 3.7 cm, previously 1.0 cm when measured similarly (series 2, image 58). There are additional lesions in caudate (series 2, image 54), lateral right lobe (series 2, image 52) and liver dome (series 2, image 39), all substantially enlarged compared to prior examination. Findings are consistent with worsened metastatic disease. 2. Redemonstrated residual mass at the cecal base appears thickened in comparison to prior examination, approximately 1.2 cm, previously 5 mm when measured similarly (series 4, image 61, series 2, image 110). 3. Stable 2 mm nodules of the left lung base (series 6, image 105, 116), likely benign and incidental. No definite evidence of metastatic  disease in the chest. Attention on follow-up. Electronically Signed   By: Eddie Candle M.D.   On: 06/15/2019 13:57   Ct Abdomen Pelvis W Contrast  Result Date: 06/15/2019 CLINICAL DATA:  Metastatic colon cancer, assess treatment response EXAM: CT CHEST, ABDOMEN, AND PELVIS WITH CONTRAST TECHNIQUE: Multidetector CT imaging of the chest, abdomen and pelvis was performed following the standard protocol during bolus administration of intravenous contrast. CONTRAST:  146m OMNIPAQUE IOHEXOL 300 MG/ML SOLN, additional oral enteric contrast COMPARISON:  CT abdomen pelvis, 12/13/2018, 08/27/2018, PET-CT, 07/16/2018, thyroid ultrasound, 08/20/2018 FINDINGS: CT CHEST FINDINGS Cardiovascular: Right chest port catheter. Normal heart size. No pericardial effusion. Mediastinum/Nodes: No enlarged mediastinal, hilar, or axillary lymph nodes. Redemonstrated hypodense 2.4 cm nodule of the right lobe of the thyroid (series 2, image 6). Trachea, and esophagus demonstrate no significant findings. Lungs/Pleura: Unchanged bandlike scarring of the right lung base. Stable 2 mm nodules of the left lung base (series 6, image  105, 116). No pleural effusion or pneumothorax. Musculoskeletal: No chest wall mass or suspicious bone lesions identified. CT ABDOMEN PELVIS FINDINGS Hepatobiliary: Significant interval enlargement of ill-defined, hypodense masses of the liver, the largest index mass of the inferior right lobe, segment VI, measuring 4.1 x 3.7 cm, previously 1.0 cm when measured similarly (series 2, image 58). There are additional lesions in caudate (series 2, image 54), lateral right lobe (series 2, image 52) and liver dome (series 2, image 39), all substantially enlarged compared to prior examination. Gallstone near the gallbladder neck. No gallbladder wall thickening, or biliary dilatation. Pancreas: Unremarkable. No pancreatic ductal dilatation or surrounding inflammatory changes. Spleen: Normal in size without significant  abnormality. Adrenals/Urinary Tract: Adrenal glands are unremarkable. Fluid attenuation cyst of the posterosuperior pole of the right kidney. Kidneys are otherwise normal, without renal calculi, solid lesion, or hydronephrosis. Bladder is unremarkable. Stomach/Bowel: Stomach is within normal limits. Appendix appears normal. Redemonstrated residual mass at the cecal base appears thickened in comparison to prior examination, approximately 1.2 cm, previously 5 mm when measured similarly (series 4, image 61, series 2, image 110). Sigmoid diverticulosis. Vascular/Lymphatic: No significant vascular findings are present. No enlarged abdominal or pelvic lymph nodes. Reproductive: Status post hysterectomy. Other: No abdominal wall hernia or abnormality. No abdominopelvic ascites. Musculoskeletal: No acute or significant osseous findings. Unchanged benign sclerotic bone island of the right iliac wing (series 2, image 109). IMPRESSION: 1. Significant interval enlargement of ill-defined, hypodense masses of the liver, the largest index mass of the inferior right lobe, segment VI, measuring 4.1 x 3.7 cm, previously 1.0 cm when measured similarly (series 2, image 58). There are additional lesions in caudate (series 2, image 54), lateral right lobe (series 2, image 52) and liver dome (series 2, image 39), all substantially enlarged compared to prior examination. Findings are consistent with worsened metastatic disease. 2. Redemonstrated residual mass at the cecal base appears thickened in comparison to prior examination, approximately 1.2 cm, previously 5 mm when measured similarly (series 4, image 61, series 2, image 110). 3. Stable 2 mm nodules of the left lung base (series 6, image 105, 116), likely benign and incidental. No definite evidence of metastatic disease in the chest. Attention on follow-up. Electronically Signed   By: Eddie Candle M.D.   On: 06/15/2019 13:57    ASSESSMENT & PLAN:   48 y.o. female with  1. Iron  Deficiency Anemia - ? Previous heavy periods vs GI losses  Recent heavy NSAID use ? Ulcer  2. B12 deficiency Labs upon initial presentation from 04/16/18, HGB at 9.5. The 04/09/18 Ferritin was low at 6 and Vitamin B12 was at 196 -No antiparietal antibodies, no intrinsic factor antibodies, normal TSH all from 04/28/18  PLAN: -Continue NSAID avoidance  -continue 1042mg Vitamin B12 sublingually daily - B12 improved from 196 to 584  3.MetastaticAdenocarcinoma of cecum with liver mets. KRAS mutated.  04/16/18 CT A/P with pt revealed No acute findings in the abdomen/pelvis. Least 4 low-density hepatic lesions, some of which may represent small cysts or hemangiomas, however there is a 19 mm lesion in the left lobe of the liver that is incompletely characterized. Recommend nonemergent hepatic protocol MRI for further evaluation..Marland KitchenPossible 11 mm pancreatic nodule rising from the tail, alternatively this may represent a splenule. This can also be assessed at time of hepatic MRI. Gallstone without gallbladder inflammation. Colonic diverticulosis without diverticulitis.   07/09/18 Tumor marker work up: CEA normal at 1.32, AFP normal at 1.5, LDH normal at 138, CA 125  normal at 5.9, CA 19-9 normal at <2   07/22/18 Liver biopsy results revealed benign hepatic tissue, and was not diagnostic   07/16/18 PET/CT revealedThere is a solid hypermetabolic mass involving the cecum, worrisome for colonic primary neoplasm. Correlation withcolonoscopy Findings. 2. Multiple hypermetabolic liver lesions compatible with metastaticdisease. 3. Large low-density lesion in right lobe of thyroid gland. Advise further evaluation with thyroid sonography. 4. Aortic Atherosclerosis. 5. Small hiatal hernia.   08/27/18 Liver biopsy revealed Metastatic adenocarcinoma, consistent with gastrointestinal primary 08/27/18 Molecular pathology- KRAS mutation positive   09/03/18 ECHO revealed an LV EF of 07-22%, mild diastolic  dysfunction; mild RVE; dilated PA; mild TR; moderate to severe pulmonary hypertension  09/17/18 CEA at 2.39, which was the last CEA pre-treatment value   12/13/18 CT A/P revealed Interval response to therapy. There is been decrease in size of multifocal liver metastasis. No new or progressive findings. 2. Hiatal hernia.   Did postpone C6 by one week due to concern for mild infection.   Did postpone C7 by two weeks for infection  4. S/p Neutropenia  4. Stye   PLAN: -Discussed pt labwork today, 06/21/19; all values are WNL except for WBC at 3.6K, RBC at 3.48, Hgb at 9.7, HCT at 32.1, RDW at 16.0, Albumin at 2.6, Total Bilirubin at 0.2. -Discussed 06/15/2019 CT C/A/P ([id number]) ([id number]) which revealed "1. Significant interval enlargement of ill-defined, hypodense masses of the liver, the largest index mass of the inferior right lobe, segment VI, measuring 4.1 x 3.7 cm, previously 1.0 cm when measured similarly (series 2, image 58). There are additional lesions in caudate (series 2, image 54), lateral right lobe (series 2, image 52) and liver dome (series 2, image 39), all substantially enlarged compared to prior examination. Findings are consistent with worsened metastatic disease. 2. Redemonstrated residual mass at the cecal base appears thickened in comparison to prior examination, approximately 1.2 cm, previously 5 mm when measured similarly (series 4, image 61, series 2, image 110). 3. Stable 2 mm nodules of the left lung base (series 6, image 105, 116), likely benign and incidental. No definite evidence of metastatic disease in the chest. Attention on follow-up."1. Significant interval enlargement of ill-defined, hypodense masses of the liver, the largest index mass of the inferior right lobe, segment VI, measuring 4.1 x 3.7 cm, previously 1.0 cm when measured similarly (series 2, image 58). There are additional lesions in caudate (series 2, image 54), lateral right lobe (series 2, image 52)  and liver dome (series 2, image 39), all substantially enlarged compared to prior examination. Findings are consistent with worsened metastatic disease. 2. Redemonstrated residual mass at the cecal base appears thickened in comparison to prior examination, approximately 1.2 cm, previously 5 mm when measured similarly (series 4, image 61, series 2, image 110). 3. Stable 2 mm nodules of the left lung base (series 6, image 105, 116), likely benign and incidental. No definite evidence of metastatic disease in the chest. Attention on follow-up." -CT C/A/P is concerning for progression in the liver, liver chemistries are currently stable -Pt notes that she wouldn't have transportation to other facilities for clinical trials  -Will start second line treatment of FOLFIRI; will exchange Oxaliplatin for Irinotecan -Advised pt that Irinotecan can cause diarrhea and liver function issues  -Will hold C13D1 will do 5FU/LV today  -Will consider restarting Avastin on the lowest possible dose depending on how pt tolerates C1 of FOLFIRI -Will see back with start of FOLFIRI on 10/21 with labs   FOLLOW UP:  Switching treatment to FOLFIRI from 10/21 with labs and MD visit  The total time spent in the appt was 40 minutes and more than 50% was on counseling and direct patient cares.  All of the patient's questions were answered with apparent satisfaction. The patient knows to call the clinic with any problems, questions or concerns.   Sullivan Lone MD [place] AAHIVMS [facility] Hematology/Oncology Physician [facility]  (Office):       [phone] (Work cell):  [phone] (Fax):           [phone]  06/21/2019 11:23 AM  I, Yevette Edwards, am acting as a scribe for Dr. Sullivan Lone.   .I have reviewed the above documentation for accuracy and completeness, and I agree with the above. Brunetta Genera MD

## 2019-06-21 ENCOUNTER — Inpatient Hospital Stay: Payer: Medicaid Other

## 2019-06-21 ENCOUNTER — Inpatient Hospital Stay: Payer: Medicaid Other | Attending: Hematology

## 2019-06-21 ENCOUNTER — Inpatient Hospital Stay (HOSPITAL_BASED_OUTPATIENT_CLINIC_OR_DEPARTMENT_OTHER): Payer: Medicaid Other | Admitting: Hematology

## 2019-06-21 ENCOUNTER — Telehealth: Payer: Self-pay | Admitting: Hematology

## 2019-06-21 ENCOUNTER — Other Ambulatory Visit: Payer: Self-pay

## 2019-06-21 VITALS — BP 170/78 | HR 79 | Temp 98.2°F | Resp 18 | Ht 70.0 in | Wt >= 6400 oz

## 2019-06-21 DIAGNOSIS — Z23 Encounter for immunization: Secondary | ICD-10-CM | POA: Insufficient documentation

## 2019-06-21 DIAGNOSIS — Z452 Encounter for adjustment and management of vascular access device: Secondary | ICD-10-CM | POA: Diagnosis present

## 2019-06-21 DIAGNOSIS — R1031 Right lower quadrant pain: Secondary | ICD-10-CM | POA: Insufficient documentation

## 2019-06-21 DIAGNOSIS — Z7951 Long term (current) use of inhaled steroids: Secondary | ICD-10-CM | POA: Insufficient documentation

## 2019-06-21 DIAGNOSIS — I272 Pulmonary hypertension, unspecified: Secondary | ICD-10-CM | POA: Diagnosis not present

## 2019-06-21 DIAGNOSIS — C180 Malignant neoplasm of cecum: Secondary | ICD-10-CM | POA: Insufficient documentation

## 2019-06-21 DIAGNOSIS — Z7189 Other specified counseling: Secondary | ICD-10-CM

## 2019-06-21 DIAGNOSIS — C189 Malignant neoplasm of colon, unspecified: Secondary | ICD-10-CM

## 2019-06-21 DIAGNOSIS — J45909 Unspecified asthma, uncomplicated: Secondary | ICD-10-CM | POA: Insufficient documentation

## 2019-06-21 DIAGNOSIS — Z79899 Other long term (current) drug therapy: Secondary | ICD-10-CM | POA: Diagnosis not present

## 2019-06-21 DIAGNOSIS — I10 Essential (primary) hypertension: Secondary | ICD-10-CM | POA: Diagnosis not present

## 2019-06-21 DIAGNOSIS — K219 Gastro-esophageal reflux disease without esophagitis: Secondary | ICD-10-CM | POA: Insufficient documentation

## 2019-06-21 DIAGNOSIS — Z791 Long term (current) use of non-steroidal anti-inflammatories (NSAID): Secondary | ICD-10-CM | POA: Diagnosis not present

## 2019-06-21 DIAGNOSIS — E538 Deficiency of other specified B group vitamins: Secondary | ICD-10-CM | POA: Diagnosis not present

## 2019-06-21 DIAGNOSIS — M25561 Pain in right knee: Secondary | ICD-10-CM | POA: Insufficient documentation

## 2019-06-21 DIAGNOSIS — M545 Low back pain: Secondary | ICD-10-CM | POA: Diagnosis not present

## 2019-06-21 DIAGNOSIS — Z5111 Encounter for antineoplastic chemotherapy: Secondary | ICD-10-CM

## 2019-06-21 DIAGNOSIS — Z95828 Presence of other vascular implants and grafts: Secondary | ICD-10-CM

## 2019-06-21 DIAGNOSIS — D509 Iron deficiency anemia, unspecified: Secondary | ICD-10-CM

## 2019-06-21 DIAGNOSIS — Z87891 Personal history of nicotine dependence: Secondary | ICD-10-CM | POA: Diagnosis not present

## 2019-06-21 DIAGNOSIS — R42 Dizziness and giddiness: Secondary | ICD-10-CM | POA: Insufficient documentation

## 2019-06-21 DIAGNOSIS — C787 Secondary malignant neoplasm of liver and intrahepatic bile duct: Secondary | ICD-10-CM

## 2019-06-21 LAB — CBC WITH DIFFERENTIAL/PLATELET
Abs Immature Granulocytes: 0.01 10*3/uL (ref 0.00–0.07)
Basophils Absolute: 0 10*3/uL (ref 0.0–0.1)
Basophils Relative: 0 %
Eosinophils Absolute: 0 10*3/uL (ref 0.0–0.5)
Eosinophils Relative: 1 %
HCT: 32.1 % — ABNORMAL LOW (ref 36.0–46.0)
Hemoglobin: 9.7 g/dL — ABNORMAL LOW (ref 12.0–15.0)
Immature Granulocytes: 0 %
Lymphocytes Relative: 36 %
Lymphs Abs: 1.3 10*3/uL (ref 0.7–4.0)
MCH: 27.9 pg (ref 26.0–34.0)
MCHC: 30.2 g/dL (ref 30.0–36.0)
MCV: 92.2 fL (ref 80.0–100.0)
Monocytes Absolute: 0.5 10*3/uL (ref 0.1–1.0)
Monocytes Relative: 15 %
Neutro Abs: 1.7 10*3/uL (ref 1.7–7.7)
Neutrophils Relative %: 48 %
Platelets: 241 10*3/uL (ref 150–400)
RBC: 3.48 MIL/uL — ABNORMAL LOW (ref 3.87–5.11)
RDW: 16 % — ABNORMAL HIGH (ref 11.5–15.5)
WBC: 3.6 10*3/uL — ABNORMAL LOW (ref 4.0–10.5)
nRBC: 0 % (ref 0.0–0.2)

## 2019-06-21 LAB — CMP (CANCER CENTER ONLY)
ALT: 12 U/L (ref 0–44)
AST: 17 U/L (ref 15–41)
Albumin: 2.6 g/dL — ABNORMAL LOW (ref 3.5–5.0)
Alkaline Phosphatase: 86 U/L (ref 38–126)
Anion gap: 9 (ref 5–15)
BUN: 14 mg/dL (ref 6–20)
CO2: 24 mmol/L (ref 22–32)
Calcium: 8.9 mg/dL (ref 8.9–10.3)
Chloride: 111 mmol/L (ref 98–111)
Creatinine: 0.8 mg/dL (ref 0.44–1.00)
GFR, Est AFR Am: >60 mL/min (ref >60)
GFR, Estimated: >60 mL/min (ref >60)
Glucose, Bld: 95 mg/dL (ref 70–99)
Potassium: 4 mmol/L (ref 3.5–5.1)
Sodium: 144 mmol/L (ref 135–145)
Total Bilirubin: 0.2 mg/dL — ABNORMAL LOW (ref 0.3–1.2)
Total Protein: 7.2 g/dL (ref 6.5–8.1)

## 2019-06-21 MED ORDER — DEXAMETHASONE SODIUM PHOSPHATE 10 MG/ML IJ SOLN
INTRAMUSCULAR | Status: AC
  Filled 2019-06-21: qty 1

## 2019-06-21 MED ORDER — DEXAMETHASONE SODIUM PHOSPHATE 10 MG/ML IJ SOLN
10.0000 mg | Freq: Once | INTRAMUSCULAR | Status: AC
  Administered 2019-06-21: 11:00:00 10 mg via INTRAVENOUS

## 2019-06-21 MED ORDER — SODIUM CHLORIDE 0.9 % IV SOLN
2400.0000 mg/m2 | INTRAVENOUS | Status: DC
  Administered 2019-06-21: 6950 mg via INTRAVENOUS
  Filled 2019-06-21: qty 139

## 2019-06-21 MED ORDER — DEXTROSE 5 % IV SOLN
Freq: Once | INTRAVENOUS | Status: AC
  Administered 2019-06-21: 11:00:00 via INTRAVENOUS
  Filled 2019-06-21: qty 250

## 2019-06-21 MED ORDER — PALONOSETRON HCL INJECTION 0.25 MG/5ML
0.2500 mg | Freq: Once | INTRAVENOUS | Status: AC
  Administered 2019-06-21: 11:00:00 0.25 mg via INTRAVENOUS

## 2019-06-21 MED ORDER — SODIUM CHLORIDE 0.9% FLUSH
10.0000 mL | Freq: Once | INTRAVENOUS | Status: AC
  Administered 2019-06-21: 09:00:00 10 mL
  Filled 2019-06-21: qty 10

## 2019-06-21 MED ORDER — LEUCOVORIN CALCIUM INJECTION 350 MG
400.0000 mg/m2 | Freq: Once | INTRAVENOUS | Status: AC
  Administered 2019-06-21: 12:00:00 1156 mg via INTRAVENOUS
  Filled 2019-06-21: qty 57.8

## 2019-06-21 MED ORDER — PALONOSETRON HCL INJECTION 0.25 MG/5ML
INTRAVENOUS | Status: AC
  Filled 2019-06-21: qty 5

## 2019-06-21 NOTE — Patient Instructions (Signed)

## 2019-06-21 NOTE — Telephone Encounter (Signed)
Scheduled appts per 10/6 los.  Spoke with pt and she is aware of the appt date and time

## 2019-06-22 ENCOUNTER — Encounter: Payer: Self-pay | Admitting: Hematology

## 2019-06-22 ENCOUNTER — Encounter (HOSPITAL_COMMUNITY): Payer: Self-pay | Admitting: General Practice

## 2019-06-22 NOTE — Progress Notes (Signed)
[facility] CSW Progress NOtes  Call from patient, wants information on insurance (burial and life) for cancer patients.  Also wants to connect w support groups and receive counseling re "getting my affairs in order."  Has been told she has metastatic disease,  Youngest child is 52, 5 children and several grandchildren.  Discussed options w [place], mailed her information, also referred to [facility] for supportive counseling re parenting end of life issues.  Will follow up in two weeks.  Edwyna Shell, LCSW Clinical Social Worker Phone:  [phone] Cell:  [phone]

## 2019-06-23 ENCOUNTER — Inpatient Hospital Stay: Payer: Medicaid Other

## 2019-06-23 ENCOUNTER — Other Ambulatory Visit: Payer: Self-pay

## 2019-06-23 VITALS — BP 145/80 | HR 80 | Temp 98.1°F | Resp 18

## 2019-06-23 DIAGNOSIS — Z452 Encounter for adjustment and management of vascular access device: Secondary | ICD-10-CM | POA: Diagnosis not present

## 2019-06-23 DIAGNOSIS — Z7189 Other specified counseling: Secondary | ICD-10-CM

## 2019-06-23 DIAGNOSIS — C189 Malignant neoplasm of colon, unspecified: Secondary | ICD-10-CM

## 2019-06-23 MED ORDER — SODIUM CHLORIDE 0.9% FLUSH
10.0000 mL | INTRAVENOUS | Status: DC | PRN
  Administered 2019-06-23: 11:00:00 10 mL
  Filled 2019-06-23: qty 10

## 2019-06-23 MED ORDER — HEPARIN SOD (PORK) LOCK FLUSH 100 UNIT/ML IV SOLN
500.0000 [IU] | Freq: Once | INTRAVENOUS | Status: AC | PRN
  Administered 2019-06-23: 500 [IU]
  Filled 2019-06-23: qty 5

## 2019-06-29 NOTE — Progress Notes (Signed)
HEMATOLOGY/ONCOLOGY CLINIC NOTE  Date of Service: 07/06/2019  Patient Care Team: Nolene Ebbs, MD as PCP - General (Internal Medicine) Jackelyn Knife, MD as Rounding Team (Internal Medicine)   CHIEF COMPLAINTS/PURPOSE OF CONSULTATION:   Continued mx of  Metastatic Adenocarcinoma of colon   HISTORY OF PRESENTING ILLNESS:  Margaret [name] is a wonderful 48 y.o. female who has been referred to [place] by Dr. Nolene Ebbs for evaluation and management of Iron deficiency anemia. The pt reports that she is doing well overall.   The pt recently presented to the ED on 04/16/18 for right sided abdominal pain that was evaluated with a CT A/P which revealed liver and pancreatic cyst, recommended for outpatient MRI follow up. The pt was found to have a UTI, was treated with Rocephin and discharged with Keflex. Prior to this the pt was admitted on 04/09/18 for symptomatic anemia, presenting with HGB at 6.2, received 2 units PRBCs, one IV Iron infusion, and was encouraged to seek out GI as outpatient.  She has an appointment with [facility] on 05/19/18.   The pt reports that she is still feeling dizzy and light headed and denies feeling better after her recent blood transfusion. She notes that she dizziness presents with a feeling of warmness and that she begins hyperventilating, feeling nervous about her dizziness.  She notes that prior to her recent hospital stay, she never required a blood transfusion or IV iron replacement. She notes that as a child she had frequent nose bleeds, and was diagnosed with anemia. She also notes a history of ice picca.   She had a hysterectomy in 2012, and prior to this she had very heavy periods that began at age 27 and occurred almost constantly. She was on Depo and Mirena which did not help slow menstrual losses. Besides taking iron supplements while pregnant she has not taken PO iron replacement as she reports not tolerating it very well while pregnant.   She  denies concern for black stools or blood in the stools and has not had a colonoscopy or endoscopy before. The pt notes that she has not previously had a concern for stomach ulcers, but has acid reflux and takes Prevacid as needed for 7-8 years. She notes that she has not recently used Prevacid, for the last 3 months.  She is not taking vitamin replacements and denies any dietary restrictions.  The pt notes that she has been continuing to have lower right quadrant abdominal pain that radiates across her abdomen, presents for up to 45 minutes and occurs intermittently. She notes that her abdominal pain presented on 04/13/18.   The pt notes that was taking Ibuprofen 854m BID for 8 years to address her back pain related to her herniated disk. She stopped taking this 4 months ago.   Of note prior to the patient's visit today, pt has had CT A/P completed on 04/16/18 with results revealing No acute findings in the abdomen/pelvis. Least 4 low-density hepatic lesions, some of which may represent small cysts or hemangiomas, however there is a 19 mm lesion in the left lobe of the liver that is incompletely characterized. Recommend nonemergent hepatic protocol MRI for further evaluation..Marland KitchenPossible 11 mm pancreatic nodule rising from the tail, alternatively this may represent a splenule. This can also be assessed at time of hepatic MRI. Gallstone without gallbladder inflammation. Colonic diverticulosis without diverticulitis.   Most recent lab results (04/16/18) of CBC is as follows: all values are WNL except for HGB  at 9.5, HCT at 33.6, MCH at 23.8, MCHC at 28.3, RDW at 23.2, PLT at 435k. Ferritin 04/09/18 was low at 6 Vitamin B12 on 04/09/18 was at 196  On review of systems, pt reports light headedness, dizziness, lower back pain, intermittent abdominal pain, and denies black stools, blood in the stools, tingling or numbness in her legs or arms, vaginal bleeding, and any other symptoms.   INTERVAL HISTORY:   Margaret  KENDLE [name] returns today for management, evaluation of her newly diagnosed Metastatic Adenocarcinoma of GI primary. She is here for C1D1 of FOLFIRI. The patient's last visit with [place] was on 06/21/2019. The pt reports that she is doing well overall.  The pt reports no new symptoms in the interim. She has Imodium to use as needed. She has been drinking a lot more and notes that her appetite is picking up. Pt denies much tingling/numbness in her hands and feet. Pt has to take her son to [facility] for a [facility] on 11/18 and may need to be out of town for 1-2 days.   Lab results today (07/06/19) of CBC w/diff and CMP is as follows: all values are WNL except for RBC at 3.56, Hgb at 9.7, HCT at 32.6, MCHC at 29.8, RDW at 16.4, Albumin at 2.6.  On review of systems, pt reports improved appetite and denies bloody stools, abdominal pain, bowel movement issues, numbness/tingling in hands/feet and any other symptoms.    MEDICAL HISTORY:  Past Medical History:  Diagnosis Date   Allergic rhinitis    Anxiety diagnosed in 1990   Arthritis    Asthma    colon ca with liver mets dx'd 08/2018   Dyspnea    with low blood count hx of anemia   Eczema    History of blood transfusion    Hypertension    Lower back pain    Migraine    Sleep apnea    uses CPAP    SURGICAL HISTORY: Past Surgical History:  Procedure Laterality Date   ABDOMINAL HYSTERECTOMY     cortisone injections     knees bilat and back    IR IMAGING GUIDED PORT INSERTION  09/14/2018   iron infusion     LIVER BIOPSY      SOCIAL HISTORY: Social History   Socioeconomic History   Marital status: Single    Spouse name: Not on file   Number of children: Not on file   Years of education: Not on file   Highest education level: Not on file  Occupational History   Not on file  Social Needs   Financial resource strain: Not on file   Food insecurity    Worry: Not on file    Inability: Not on file   Transportation  needs    Medical: Not on file    Non-medical: Not on file  Tobacco Use   Smoking status: Former Smoker    Quit date: 07/30/1999    Years since quitting: 19.9   Smokeless tobacco: Never Used  Substance and Sexual Activity   Alcohol use: No   Drug use: No   Sexual activity: Not on file  Lifestyle   Physical activity    Days per week: Not on file    Minutes per session: Not on file   Stress: Not on file  Relationships   Social connections    Talks on phone: Not on file    Gets together: Not on file    Attends religious service: Not on file  Active member of club or organization: Not on file    Attends meetings of clubs or organizations: Not on file    Relationship status: Not on file   Intimate partner violence    Fear of current or ex partner: Not on file    Emotionally abused: Not on file    Physically abused: Not on file    Forced sexual activity: Not on file  Other Topics Concern   Not on file  Social History Narrative   Not on file    FAMILY HISTORY: No family history on file.  ALLERGIES:  is allergic to latex; tape; and tylenol with codeine #3 [acetaminophen-codeine].  MEDICATIONS:  Current Outpatient Medications  Medication Sig Dispense Refill   albuterol (PROAIR HFA) 108 (90 Base) MCG/ACT inhaler INHALE TWO puffs BY MOUTH into the lungs EVERY 4 HOURS AS NEEDED FOR WHEEZING OR shortness of breath 18 g 1   Boric Acid 4 % SOLN Place 1 capsule vaginally every Friday.      budesonide-formoterol (SYMBICORT) 160-4.5 MCG/ACT inhaler Inhale 2 puffs into the lungs 2 (two) times a day. 10.2 g 5   cetirizine (ZYRTEC) 10 MG tablet Take 1 tablet (10 mg total) by mouth daily. 30 tablet 5   clobetasol ointment (TEMOVATE) 0.73 % Apply 1 application topically 2 (two) times daily. Use for Lichen Sclerosis     Cyanocobalamin (B-12) 1000 MCG SUBL Place 1,000 mcg under the tongue daily. 30 each 3   dexamethasone (DECADRON) 4 MG tablet TAKE 2 TABLETS (8 MG TOTAL)  BY MOUTH DAILY  START THE DAY AFTER CHEMOTHERAPY FOR 2 DAYS. TAKE WITH FOOD. 30 tablet 1   diazepam (VALIUM) 5 MG tablet Take 1 tablet (5 mg total) by mouth daily as needed for anxiety (panic attacks). 30 tablet 0   diclofenac sodium (VOLTAREN) 1 % GEL Apply 2 g topically 4 (four) times daily as needed (for back/knee pain.).      dicyclomine (BENTYL) 20 MG tablet Take 1 tablet (20 mg total) by mouth 2 (two) times daily. (Patient taking differently: Take 20 mg by mouth 3 (three) times daily before meals. ) 20 tablet 0   dronabinol (MARINOL) 5 MG capsule Take 1 capsule (5 mg total) by mouth 2 (two) times daily before a meal. 60 capsule 0   ergocalciferol (VITAMIN D2) 50000 UNITS capsule Take 50,000 Units by mouth every Monday.      lidocaine-prilocaine (EMLA) cream Apply to affected area once 30 g 3   lisinopril-hydrochlorothiazide (PRINZIDE,ZESTORETIC) 20-12.5 MG tablet Take 1 tablet by mouth daily.     methocarbamol (ROBAXIN) 750 MG tablet Take 750 mg by mouth every 8 (eight) hours as needed (back spasms.).      montelukast (SINGULAIR) 10 MG tablet Take 1 tablet (10 mg total) by mouth at bedtime. 30 tablet 6   nystatin-triamcinolone (MYCOLOG II) cream Apply 1 application topically 2 (two) times daily as needed (irritation). Use in skin folds and under breast      Olopatadine HCl (PAZEO) 0.7 % SOLN Apply 1 drop to eye daily. 2.5 mL 5   ondansetron (ZOFRAN) 8 MG tablet Take 1 tablet (8 mg total) by mouth 2 (two) times daily as needed for refractory nausea / vomiting. Start on day 3 after chemotherapy. 30 tablet 0   pantoprazole (PROTONIX) 40 MG tablet Take 1 tablet (40 mg total) by mouth daily before breakfast. 30 tablet 1   pimecrolimus (ELIDEL) 1 % cream Apply 1 application topically 2 (two) times daily. 30 g 6  prochlorperazine (COMPAZINE) 10 MG tablet Take 1 tablet (10 mg total) by mouth every 6 (six) hours as needed (Nausea or vomiting). 30 tablet 1   saccharomyces boulardii  (FLORASTOR) 250 MG capsule Take 1 capsule (250 mg total) by mouth 2 (two) times daily.     traMADol (ULTRAM) 50 MG tablet Take 1-2 tablets (50-100 mg total) by mouth every 6 (six) hours as needed for moderate pain or severe pain. 90 tablet 0   triamcinolone (NASACORT) 55 MCG/ACT AERO nasal inhaler Place 2 sprays into the nose daily as needed (for allergies). 1 Inhaler 5   valACYclovir (VALTREX) 500 MG tablet Take 500 mg by mouth 2 (two) times daily as needed (for cold sores).      No current facility-administered medications for this visit.    Facility-Administered Medications Ordered in Other Visits  Medication Dose Route Frequency Provider Last Rate Last Dose   fluorouracil (ADRUCIL) 6,950 mg in sodium chloride 0.9 % 111 mL chemo infusion  2,400 mg/m2 (Treatment Plan Recorded) Intravenous 1 day or 1 dose Brunetta Genera, MD       leucovorin 1,156 mg in dextrose 5 % 250 mL infusion  400 mg/m2 (Treatment Plan Recorded) Intravenous Once Brunetta Genera, MD 77 mL/hr at 05/24/19 1242 1,156 mg at 05/24/19 1242   oxaliplatin (ELOXATIN) 250 mg in dextrose 5 % 500 mL chemo infusion  250 mg Intravenous Once Brunetta Genera, MD 138 mL/hr at 05/24/19 1245 250 mg at 05/24/19 1245    REVIEW OF SYSTEMS:    A 10+ POINT REVIEW OF SYSTEMS WAS OBTAINED including neurology, dermatology, psychiatry, cardiac, respiratory, lymph, extremities, GI, GU, Musculoskeletal, constitutional, breasts, reproductive, HEENT.  All pertinent positives are noted in the HPI.  All others are negative.   PHYSICAL EXAMINATION:  Vitals:   07/06/19 1015  BP: (!) 143/88  Pulse: 80  Resp: 20  Temp: 97.8 F (36.6 C)  SpO2: 100%    Wt Readings from Last 3 Encounters:  07/06/19 (!) 408 lb 1.6 oz (185.1 kg)  06/21/19 (!) 402 lb 14.4 oz (182.8 kg)  05/24/19 (!) 407 lb 9.6 oz (184.9 kg)   Body mass index is 58.56 kg/m.   GENERAL:alert, in no acute distress and comfortable SKIN: no acute rashes, no  significant lesions EYES: conjunctiva are pink and non-injected, sclera anicteric OROPHARYNX: MMM, no exudates, no oropharyngeal erythema or ulceration NECK: supple, no JVD LYMPH:  no palpable lymphadenopathy in the cervical, axillary or inguinal regions LUNGS: clear to auscultation b/l with normal respiratory effort HEART: regular rate & rhythm ABDOMEN:  normoactive bowel sounds , non tender, not distended. No palpable hepatosplenomegaly.  Extremity: no pedal edema PSYCH: alert & oriented x 3 with fluent speech NEURO: no focal motor/sensory deficits   LABORATORY DATA:  I have reviewed the data as listed  . CBC Latest Ref Rng & Units 07/06/2019 06/21/2019 06/07/2019  WBC 4.0 - 10.5 K/uL 5.4 3.6(L) 3.9(L)  Hemoglobin 12.0 - 15.0 g/dL 9.7(L) 9.7(L) 9.8(L)  Hematocrit 36.0 - 46.0 % 32.6(L) 32.1(L) 33.1(L)  Platelets 150 - 400 K/uL 178 241 222   ANC 900 . CMP Latest Ref Rng & Units 07/06/2019 06/21/2019 06/07/2019  Glucose 70 - 99 mg/dL 95 95 99  BUN 6 - 20 mg/dL _0 Creatinine 0.44 - 1.00 mg/dL 0.78 0.80 0.81  Sodium 135 - 145 mmol/L 143 144 144  Potassium 3.5 - 5.1 mmol/L 4.1 4.0 4.2  Chloride 98 - 111 mmol/L 109 111 110  CO2 22 -  32 mmol/L _0 Calcium 8.9 - 10.3 mg/dL 8.9 8.9 8.8(L)  Total Protein 6.5 - 8.1 g/dL 6.9 7.2 7.4  Total Bilirubin 0.3 - 1.2 mg/dL 0.4 0.2(L) 0.3  Alkaline Phos 38 - 126 U/L 82 86 91  AST 15 - 41 U/L _1 ALT 0 - 44 U/L _2 . Lab Results  Component Value Date   IRON 24 (L) 09/06/2018   TIBC 156 (L) 09/06/2018   IRONPCTSAT 15 (L) 09/06/2018   (Iron and TIBC)  Lab Results  Component Value Date   FERRITIN 540 (H) 11/03/2018   B12  - 196--> 584  07/22/18 Liver Biopsy:   08/27/18 Repeat Liver Biopsy:   Molecular Pathology:     RADIOGRAPHIC STUDIES: I have personally reviewed the radiological images as listed and agreed with the findings in the report. Ct Chest W Contrast  Result Date: 06/15/2019 CLINICAL DATA:   Metastatic colon cancer, assess treatment response EXAM: CT CHEST, ABDOMEN, AND PELVIS WITH CONTRAST TECHNIQUE: Multidetector CT imaging of the chest, abdomen and pelvis was performed following the standard protocol during bolus administration of intravenous contrast. CONTRAST:  163m OMNIPAQUE IOHEXOL 300 MG/ML SOLN, additional oral enteric contrast COMPARISON:  CT abdomen pelvis, 12/13/2018, 08/27/2018, PET-CT, 07/16/2018, thyroid ultrasound, 08/20/2018 FINDINGS: CT CHEST FINDINGS Cardiovascular: Right chest port catheter. Normal heart size. No pericardial effusion. Mediastinum/Nodes: No enlarged mediastinal, hilar, or axillary lymph nodes. Redemonstrated hypodense 2.4 cm nodule of the right lobe of the thyroid (series 2, image 6). Trachea, and esophagus demonstrate no significant findings. Lungs/Pleura: Unchanged bandlike scarring of the right lung base. Stable 2 mm nodules of the left lung base (series 6, image 105, 116). No pleural effusion or pneumothorax. Musculoskeletal: No chest wall mass or suspicious bone lesions identified. CT ABDOMEN PELVIS FINDINGS Hepatobiliary: Significant interval enlargement of ill-defined, hypodense masses of the liver, the largest index mass of the inferior right lobe, segment VI, measuring 4.1 x 3.7 cm, previously 1.0 cm when measured similarly (series 2, image 58). There are additional lesions in caudate (series 2, image 54), lateral right lobe (series 2, image 52) and liver dome (series 2, image 39), all substantially enlarged compared to prior examination. Gallstone near the gallbladder neck. No gallbladder wall thickening, or biliary dilatation. Pancreas: Unremarkable. No pancreatic ductal dilatation or surrounding inflammatory changes. Spleen: Normal in size without significant abnormality. Adrenals/Urinary Tract: Adrenal glands are unremarkable. Fluid attenuation cyst of the posterosuperior pole of the right kidney. Kidneys are otherwise normal, without renal calculi,  solid lesion, or hydronephrosis. Bladder is unremarkable. Stomach/Bowel: Stomach is within normal limits. Appendix appears normal. Redemonstrated residual mass at the cecal base appears thickened in comparison to prior examination, approximately 1.2 cm, previously 5 mm when measured similarly (series 4, image 61, series 2, image 110). Sigmoid diverticulosis. Vascular/Lymphatic: No significant vascular findings are present. No enlarged abdominal or pelvic lymph nodes. Reproductive: Status post hysterectomy. Other: No abdominal wall hernia or abnormality. No abdominopelvic ascites. Musculoskeletal: No acute or significant osseous findings. Unchanged benign sclerotic bone island of the right iliac wing (series 2, image 109). IMPRESSION: 1. Significant interval enlargement of ill-defined, hypodense masses of the liver, the largest index mass of the inferior right lobe, segment VI, measuring 4.1 x 3.7 cm, previously 1.0 cm when measured similarly (series 2, image 58). There are additional lesions in caudate (series 2, image 54), lateral right lobe (series 2, image 52) and liver dome (series 2, image 39), all substantially enlarged compared to prior  examination. Findings are consistent with worsened metastatic disease. 2. Redemonstrated residual mass at the cecal base appears thickened in comparison to prior examination, approximately 1.2 cm, previously 5 mm when measured similarly (series 4, image 61, series 2, image 110). 3. Stable 2 mm nodules of the left lung base (series 6, image 105, 116), likely benign and incidental. No definite evidence of metastatic disease in the chest. Attention on follow-up. Electronically Signed   By: Eddie Candle M.D.   On: 06/15/2019 13:57   Ct Abdomen Pelvis W Contrast  Result Date: 06/15/2019 CLINICAL DATA:  Metastatic colon cancer, assess treatment response EXAM: CT CHEST, ABDOMEN, AND PELVIS WITH CONTRAST TECHNIQUE: Multidetector CT imaging of the chest, abdomen and pelvis was  performed following the standard protocol during bolus administration of intravenous contrast. CONTRAST:  125m OMNIPAQUE IOHEXOL 300 MG/ML SOLN, additional oral enteric contrast COMPARISON:  CT abdomen pelvis, 12/13/2018, 08/27/2018, PET-CT, 07/16/2018, thyroid ultrasound, 08/20/2018 FINDINGS: CT CHEST FINDINGS Cardiovascular: Right chest port catheter. Normal heart size. No pericardial effusion. Mediastinum/Nodes: No enlarged mediastinal, hilar, or axillary lymph nodes. Redemonstrated hypodense 2.4 cm nodule of the right lobe of the thyroid (series 2, image 6). Trachea, and esophagus demonstrate no significant findings. Lungs/Pleura: Unchanged bandlike scarring of the right lung base. Stable 2 mm nodules of the left lung base (series 6, image 105, 116). No pleural effusion or pneumothorax. Musculoskeletal: No chest wall mass or suspicious bone lesions identified. CT ABDOMEN PELVIS FINDINGS Hepatobiliary: Significant interval enlargement of ill-defined, hypodense masses of the liver, the largest index mass of the inferior right lobe, segment VI, measuring 4.1 x 3.7 cm, previously 1.0 cm when measured similarly (series 2, image 58). There are additional lesions in caudate (series 2, image 54), lateral right lobe (series 2, image 52) and liver dome (series 2, image 39), all substantially enlarged compared to prior examination. Gallstone near the gallbladder neck. No gallbladder wall thickening, or biliary dilatation. Pancreas: Unremarkable. No pancreatic ductal dilatation or surrounding inflammatory changes. Spleen: Normal in size without significant abnormality. Adrenals/Urinary Tract: Adrenal glands are unremarkable. Fluid attenuation cyst of the posterosuperior pole of the right kidney. Kidneys are otherwise normal, without renal calculi, solid lesion, or hydronephrosis. Bladder is unremarkable. Stomach/Bowel: Stomach is within normal limits. Appendix appears normal. Redemonstrated residual mass at the cecal base  appears thickened in comparison to prior examination, approximately 1.2 cm, previously 5 mm when measured similarly (series 4, image 61, series 2, image 110). Sigmoid diverticulosis. Vascular/Lymphatic: No significant vascular findings are present. No enlarged abdominal or pelvic lymph nodes. Reproductive: Status post hysterectomy. Other: No abdominal wall hernia or abnormality. No abdominopelvic ascites. Musculoskeletal: No acute or significant osseous findings. Unchanged benign sclerotic bone island of the right iliac wing (series 2, image 109). IMPRESSION: 1. Significant interval enlargement of ill-defined, hypodense masses of the liver, the largest index mass of the inferior right lobe, segment VI, measuring 4.1 x 3.7 cm, previously 1.0 cm when measured similarly (series 2, image 58). There are additional lesions in caudate (series 2, image 54), lateral right lobe (series 2, image 52) and liver dome (series 2, image 39), all substantially enlarged compared to prior examination. Findings are consistent with worsened metastatic disease. 2. Redemonstrated residual mass at the cecal base appears thickened in comparison to prior examination, approximately 1.2 cm, previously 5 mm when measured similarly (series 4, image 61, series 2, image 110). 3. Stable 2 mm nodules of the left lung base (series 6, image 105, 116), likely benign and incidental. No definite evidence of  metastatic disease in the chest. Attention on follow-up. Electronically Signed   By: Eddie Candle M.D.   On: 06/15/2019 13:57    ASSESSMENT & PLAN:   48 y.o. female with  1. Iron Deficiency Anemia - ? Previous heavy periods vs GI losses  Recent heavy NSAID use ? Ulcer  2. B12 deficiency Labs upon initial presentation from 04/16/18, HGB at 9.5. The 04/09/18 Ferritin was low at 6 and Vitamin B12 was at 196 -No antiparietal antibodies, no intrinsic factor antibodies, normal TSH all from 04/28/18  PLAN: -Continue NSAID avoidance  -continue  1033mg Vitamin B12 sublingually daily - B12 improved from 196 to 584  3.MetastaticAdenocarcinoma of cecum with liver mets. KRAS mutated.  04/16/18 CT A/P with pt revealed No acute findings in the abdomen/pelvis. Least 4 low-density hepatic lesions, some of which may represent small cysts or hemangiomas, however there is a 19 mm lesion in the left lobe of the liver that is incompletely characterized. Recommend nonemergent hepatic protocol MRI for further evaluation..Marland KitchenPossible 11 mm pancreatic nodule rising from the tail, alternatively this may represent a splenule. This can also be assessed at time of hepatic MRI. Gallstone without gallbladder inflammation. Colonic diverticulosis without diverticulitis.   07/09/18 Tumor marker work up: CEA normal at 1.32, AFP normal at 1.5, LDH normal at 138, CA 125 normal at 5.9, CA 19-9 normal at <2   07/22/18 Liver biopsy results revealed benign hepatic tissue, and was not diagnostic   07/16/18 PET/CT revealedThere is a solid hypermetabolic mass involving the cecum, worrisome for colonic primary neoplasm. Correlation withcolonoscopy Findings. 2. Multiple hypermetabolic liver lesions compatible with metastaticdisease. 3. Large low-density lesion in right lobe of thyroid gland. Advise further evaluation with thyroid sonography. 4. Aortic Atherosclerosis. 5. Small hiatal hernia.   08/27/18 Liver biopsy revealed Metastatic adenocarcinoma, consistent with gastrointestinal primary 08/27/18 Molecular pathology- KRAS mutation positive   09/03/18 ECHO revealed an LV EF of 691-66% mild diastolic dysfunction; mild RVE; dilated PA; mild TR; moderate to severe pulmonary hypertension  09/17/18 CEA at 2.39, which was the last CEA pre-treatment value   12/13/18 CT A/P revealed Interval response to therapy. There is been decrease in size of multifocal liver metastasis. No new or progressive findings. 2. Hiatal hernia.   Did postpone C6 by one week due to concern for mild  infection.   Did postpone C7 by two weeks for infection  06/15/2019 CT C/A/P ([id number] ([id number] revealed "1. Significant interval enlargement of ill-defined, hypodense masses of the liver, the largest index mass of the inferior right lobe, segment VI, measuring 4.1 x 3.7 cm, previously 1.0 cm when measured similarly (series 2, image 58). There are additional lesions in caudate (series 2, image 54), lateral right lobe (series 2, image 52) and liver dome (series 2, image 39), all substantially enlarged compared to prior examination. Findings are consistent with worsened metastatic disease. 2. Redemonstrated residual mass at the cecal base appears thickened in comparison to prior examination, approximately 1.2 cm, previously 5 mm when measured similarly (series 4, image 61, series 2, image 110). 3. Stable 2 mm nodules of the left lung base (series 6, image 105, 116), likely benign and incidental. No definite evidence of metastatic disease in the chest. Attention on follow-up."1. Significant interval enlargement of ill-defined, hypodense masses of the liver, the largest index mass of the inferior right lobe, segment VI, measuring 4.1 x 3.7 cm, previously 1.0 cm when measured similarly (series 2, image 58). There are additional lesions in caudate (series 2, image 54),  lateral right lobe (series 2, image 52) and liver dome (series 2, image 39), all substantially enlarged compared to prior examination. Findings are consistent with worsened metastatic disease. 2. Redemonstrated residual mass at the cecal base appears thickened in comparison to prior examination, approximately 1.2 cm, previously 5 mm when measured similarly (series 4, image 61, series 2, image 110). 3. Stable 2 mm nodules of the left lung base (series 6, image 105, 116), likely benign and incidental. No definite evidence of metastatic disease in the chest. Attention on follow-up."  PLAN: -Discussed pt labwork today, 07/06/19; blood counts are  stable, blood chemistries are steady.  -Pt notes that she wouldn't have transportation to other facilities for clinical trials  -Pt aware that Irinotecan can cause diarrhea and liver function issues  -No prohibitive toxicities from starting C1 FOLFIRI at this time . Ordered reviewed and signed. -Will consider restarting Avastin on the lowest possible dose depending on how pt tolerates C1 of FOLFIRI -Advised pt to contact if any concerns or changes in symptomology  -Will see back in 2 weeks with next treatment   FOLLOW UP: Please schedule next cycle of FOLFIRI with labs and MD visit in 2 weeks  The total time spent in the appt was 20 minutes and more than 50% was on counseling and direct patient cares.  All of the patient's questions were answered with apparent satisfaction. The patient knows to call the clinic with any problems, questions or concerns.   Sullivan Lone MD [place] AAHIVMS [facility] Hematology/Oncology Physician [facility]  (Office):       [phone] (Work cell):  [phone] (Fax):           [phone]  07/06/2019 10:41 AM  I, Yevette Edwards, am acting as a scribe for Dr. Sullivan Lone.   .I have reviewed the above documentation for accuracy and completeness, and I agree with the above. Brunetta Genera MD

## 2019-07-04 ENCOUNTER — Encounter: Payer: Self-pay | Admitting: Hematology

## 2019-07-04 ENCOUNTER — Other Ambulatory Visit: Payer: Self-pay | Admitting: Hematology

## 2019-07-04 DIAGNOSIS — Z7189 Other specified counseling: Secondary | ICD-10-CM

## 2019-07-04 DIAGNOSIS — C189 Malignant neoplasm of colon, unspecified: Secondary | ICD-10-CM

## 2019-07-04 DIAGNOSIS — C787 Secondary malignant neoplasm of liver and intrahepatic bile duct: Secondary | ICD-10-CM

## 2019-07-04 MED ORDER — ONDANSETRON HCL 8 MG PO TABS: 8.0000 mg | ORAL_TABLET | Freq: Two times a day (BID) | ORAL | 1 refills | Status: DC | PRN

## 2019-07-04 MED ORDER — DEXAMETHASONE 4 MG PO TABS: 8.0000 mg | ORAL_TABLET | Freq: Every day | ORAL | 5 refills | Status: DC

## 2019-07-04 MED ORDER — LORAZEPAM 1 MG PO TABS: 1.0000 mg | ORAL_TABLET | Freq: Four times a day (QID) | ORAL | 0 refills | Status: DC | PRN

## 2019-07-04 MED ORDER — LOPERAMIDE HCL 2 MG PO TABS: 2.0000 mg | ORAL_TABLET | Freq: Four times a day (QID) | ORAL | 1 refills | Status: DC | PRN

## 2019-07-04 MED ORDER — PROCHLORPERAZINE MALEATE 10 MG PO TABS: 10.0000 mg | ORAL_TABLET | Freq: Four times a day (QID) | ORAL | 1 refills | Status: DC | PRN

## 2019-07-04 NOTE — Progress Notes (Signed)
DISCONTINUE OFF PATHWAY REGIMEN - Colorectal   OFF00725:FOLFOX (q14d):   A cycle is every 14 days:     Oxaliplatin      Leucovorin      5-Fluorouracil      5-Fluorouracil   **Always confirm dose/schedule in your pharmacy ordering system**  REASON: Disease Progression PRIOR TREATMENT: Off Pathway: FOLFOX (q14d) TREATMENT RESPONSE: Partial Response (PR)  START ON PATHWAY REGIMEN - Colorectal     A cycle is every 14 days:     Bevacizumab-xxxx      Irinotecan      Leucovorin      Fluorouracil      Fluorouracil   **Always confirm dose/schedule in your pharmacy ordering system**  Patient Characteristics: Distant Metastases, Nonsurgical Candidate, KRAS/NRAS Mutation Positive/Unknown (BRAF V600 Wild-Type/Unknown), Standard Cytotoxic Therapy, Second Line Standard Cytotoxic Therapy, Bevacizumab Eligible Tumor Location: Colon Therapeutic Status: Distant Metastases Microsatellite/Mismatch Repair Status: MSS/pMMR BRAF Mutation Status: Wild-Type (no mutation) KRAS/NRAS Mutation Status: Awaiting Test Results Standard Cytotoxic Line of Therapy: Second Line Standard Cytotoxic Therapy Bevacizumab Eligibility: Eligible Intent of Therapy: Non-Curative / Palliative Intent, Discussed with Patient

## 2019-07-04 NOTE — Progress Notes (Signed)
OFF PATHWAY REGIMEN - Colorectal  No Change  Continue With Treatment as Ordered.   OFF00725:FOLFOX (q14d):   A cycle is every 14 days:     Oxaliplatin      Leucovorin      5-Fluorouracil      5-Fluorouracil   **Always confirm dose/schedule in your pharmacy ordering system**  Patient Characteristics: Distant Metastases, First Line, Nonsurgical Candidate, KRAS Mutation Positive/Unknown, BRAF Wild-Type/Unknown, PS > 1; Bevacizumab Ineligible Therapeutic Status: Distant Metastases BRAF Mutation Status: Awaiting Test Results KRAS/NRAS Mutation Status: Awaiting Test Results Line of Therapy: First Line Performance Status: PS > 1 Bevacizumab Eligibility: Ineligible Intent of Therapy: Non-Curative / Palliative Intent, Discussed with Patient

## 2019-07-06 ENCOUNTER — Inpatient Hospital Stay (HOSPITAL_BASED_OUTPATIENT_CLINIC_OR_DEPARTMENT_OTHER): Payer: Medicaid Other | Admitting: Hematology

## 2019-07-06 ENCOUNTER — Encounter: Payer: Self-pay | Admitting: Hematology

## 2019-07-06 ENCOUNTER — Inpatient Hospital Stay (HOSPITAL_BASED_OUTPATIENT_CLINIC_OR_DEPARTMENT_OTHER): Payer: Medicaid Other | Admitting: Medical

## 2019-07-06 ENCOUNTER — Other Ambulatory Visit: Payer: Self-pay

## 2019-07-06 ENCOUNTER — Inpatient Hospital Stay: Payer: Medicaid Other

## 2019-07-06 VITALS — BP 143/88 | HR 80 | Temp 97.8°F | Resp 20 | Ht 70.0 in | Wt >= 6400 oz

## 2019-07-06 DIAGNOSIS — Z95828 Presence of other vascular implants and grafts: Secondary | ICD-10-CM

## 2019-07-06 DIAGNOSIS — C787 Secondary malignant neoplasm of liver and intrahepatic bile duct: Secondary | ICD-10-CM

## 2019-07-06 DIAGNOSIS — C189 Malignant neoplasm of colon, unspecified: Secondary | ICD-10-CM

## 2019-07-06 DIAGNOSIS — Z23 Encounter for immunization: Secondary | ICD-10-CM

## 2019-07-06 DIAGNOSIS — Z5111 Encounter for antineoplastic chemotherapy: Secondary | ICD-10-CM | POA: Diagnosis not present

## 2019-07-06 DIAGNOSIS — D509 Iron deficiency anemia, unspecified: Secondary | ICD-10-CM

## 2019-07-06 DIAGNOSIS — Z7189 Other specified counseling: Secondary | ICD-10-CM

## 2019-07-06 DIAGNOSIS — Z452 Encounter for adjustment and management of vascular access device: Secondary | ICD-10-CM | POA: Diagnosis not present

## 2019-07-06 LAB — CBC WITH DIFFERENTIAL/PLATELET
Abs Immature Granulocytes: 0.01 10*3/uL (ref 0.00–0.07)
Basophils Absolute: 0 10*3/uL (ref 0.0–0.1)
Basophils Relative: 0 %
Eosinophils Absolute: 0.1 10*3/uL (ref 0.0–0.5)
Eosinophils Relative: 2 %
HCT: 32.6 % — ABNORMAL LOW (ref 36.0–46.0)
Hemoglobin: 9.7 g/dL — ABNORMAL LOW (ref 12.0–15.0)
Immature Granulocytes: 0 %
Lymphocytes Relative: 33 %
Lymphs Abs: 1.8 10*3/uL (ref 0.7–4.0)
MCH: 27.2 pg (ref 26.0–34.0)
MCHC: 29.8 g/dL — ABNORMAL LOW (ref 30.0–36.0)
MCV: 91.6 fL (ref 80.0–100.0)
Monocytes Absolute: 0.6 10*3/uL (ref 0.1–1.0)
Monocytes Relative: 12 %
Neutro Abs: 2.9 10*3/uL (ref 1.7–7.7)
Neutrophils Relative %: 53 %
Platelets: 178 10*3/uL (ref 150–400)
RBC: 3.56 MIL/uL — ABNORMAL LOW (ref 3.87–5.11)
RDW: 16.4 % — ABNORMAL HIGH (ref 11.5–15.5)
WBC: 5.4 10*3/uL (ref 4.0–10.5)
nRBC: 0 % (ref 0.0–0.2)

## 2019-07-06 LAB — CMP (CANCER CENTER ONLY)
ALT: 10 U/L (ref 0–44)
AST: 15 U/L (ref 15–41)
Albumin: 2.6 g/dL — ABNORMAL LOW (ref 3.5–5.0)
Alkaline Phosphatase: 82 U/L (ref 38–126)
Anion gap: 11 (ref 5–15)
BUN: 15 mg/dL (ref 6–20)
CO2: 23 mmol/L (ref 22–32)
Calcium: 8.9 mg/dL (ref 8.9–10.3)
Chloride: 109 mmol/L (ref 98–111)
Creatinine: 0.78 mg/dL (ref 0.44–1.00)
GFR, Est AFR Am: >60 mL/min (ref >60)
GFR, Estimated: >60 mL/min (ref >60)
Glucose, Bld: 95 mg/dL (ref 70–99)
Potassium: 4.1 mmol/L (ref 3.5–5.1)
Sodium: 143 mmol/L (ref 135–145)
Total Bilirubin: 0.4 mg/dL (ref 0.3–1.2)
Total Protein: 6.9 g/dL (ref 6.5–8.1)

## 2019-07-06 MED ORDER — DEXAMETHASONE SODIUM PHOSPHATE 10 MG/ML IJ SOLN
10.0000 mg | Freq: Once | INTRAMUSCULAR | Status: AC
  Administered 2019-07-06: 11:00:00 10 mg via INTRAVENOUS

## 2019-07-06 MED ORDER — PROCHLORPERAZINE EDISYLATE 10 MG/2ML IJ SOLN
10.0000 mg | Freq: Once | INTRAMUSCULAR | Status: AC
  Administered 2019-07-06: 10 mg via INTRAVENOUS
  Filled 2019-07-06: qty 2

## 2019-07-06 MED ORDER — SODIUM CHLORIDE 0.9 % IV SOLN
2400.0000 mg/m2 | INTRAVENOUS | Status: DC
  Administered 2019-07-06: 7200 mg via INTRAVENOUS
  Filled 2019-07-06: qty 144

## 2019-07-06 MED ORDER — INFLUENZA VAC SPLIT QUAD 0.5 ML IM SUSY
0.5000 mL | PREFILLED_SYRINGE | Freq: Once | INTRAMUSCULAR | Status: AC
  Administered 2019-07-06: 14:00:00 0.5 mL via INTRAMUSCULAR

## 2019-07-06 MED ORDER — DEXAMETHASONE SODIUM PHOSPHATE 10 MG/ML IJ SOLN
INTRAMUSCULAR | Status: AC
  Filled 2019-07-06: qty 1

## 2019-07-06 MED ORDER — ATROPINE SULFATE 1 MG/ML IJ SOLN
INTRAMUSCULAR | Status: AC
  Filled 2019-07-06: qty 1

## 2019-07-06 MED ORDER — IRINOTECAN HCL CHEMO INJECTION 100 MG/5ML
180.0000 mg/m2 | Freq: Once | INTRAVENOUS | Status: AC
  Administered 2019-07-06: 540 mg via INTRAVENOUS
  Filled 2019-07-06: qty 27

## 2019-07-06 MED ORDER — SODIUM CHLORIDE 0.9 % IV SOLN
Freq: Once | INTRAVENOUS | Status: AC
  Administered 2019-07-06: 11:00:00 via INTRAVENOUS
  Filled 2019-07-06: qty 250

## 2019-07-06 MED ORDER — SODIUM CHLORIDE 0.9 % IV SOLN: 10.0000 mg | Freq: Once | INTRAVENOUS | Status: DC

## 2019-07-06 MED ORDER — ATROPINE SULFATE 1 MG/ML IJ SOLN
0.5000 mg | Freq: Once | INTRAMUSCULAR | Status: AC | PRN
  Administered 2019-07-06: 0.5 mg via INTRAVENOUS

## 2019-07-06 MED ORDER — LEUCOVORIN CALCIUM INJECTION 350 MG
400.0000 mg/m2 | Freq: Once | INTRAVENOUS | Status: AC
  Administered 2019-07-06: 12:00:00 1200 mg via INTRAVENOUS
  Filled 2019-07-06: qty 60

## 2019-07-06 MED ORDER — SODIUM CHLORIDE 0.9% FLUSH
10.0000 mL | Freq: Once | INTRAVENOUS | Status: AC
  Administered 2019-07-06: 10 mL
  Filled 2019-07-06: qty 10

## 2019-07-06 MED ORDER — PALONOSETRON HCL INJECTION 0.25 MG/5ML
INTRAVENOUS | Status: AC
  Filled 2019-07-06: qty 5

## 2019-07-06 MED ORDER — PALONOSETRON HCL INJECTION 0.25 MG/5ML
0.2500 mg | Freq: Once | INTRAVENOUS | Status: AC
  Administered 2019-07-06: 0.25 mg via INTRAVENOUS

## 2019-07-06 MED ORDER — FLUOROURACIL CHEMO INJECTION 2.5 GM/50ML
400.0000 mg/m2 | Freq: Once | INTRAVENOUS | Status: AC
  Administered 2019-07-06: 14:00:00 1200 mg via INTRAVENOUS
  Filled 2019-07-06: qty 24

## 2019-07-06 MED ORDER — PROCHLORPERAZINE EDISYLATE 10 MG/2ML IJ SOLN
INTRAMUSCULAR | Status: AC
  Filled 2019-07-06: qty 4

## 2019-07-06 MED ORDER — INFLUENZA VAC SPLIT QUAD 0.5 ML IM SUSY
PREFILLED_SYRINGE | INTRAMUSCULAR | Status: AC
  Filled 2019-07-06: qty 0.5

## 2019-07-06 NOTE — Progress Notes (Signed)
During infusion, patient began c/o abdominal discomfort with N/V. Infusion paused and medicated as documented in [facility]. Sandi Mealy, PA-C saw patient in infusion room. Patient verbalized rapid improvement in symptoms and returned to baseline prior to reinitiating therapy. Infusion resumed and completed without further incident.

## 2019-07-07 ENCOUNTER — Telehealth: Payer: Self-pay | Admitting: *Deleted

## 2019-07-07 ENCOUNTER — Encounter: Payer: Self-pay | Admitting: General Practice

## 2019-07-07 ENCOUNTER — Telehealth: Payer: Self-pay | Admitting: Hematology

## 2019-07-07 ENCOUNTER — Inpatient Hospital Stay: Payer: Medicaid Other | Admitting: General Practice

## 2019-07-07 NOTE — Telephone Encounter (Signed)
Scheduled appt per 10/21 los. ° ° °Spoke with pt and she is aware of her appt date and time. °

## 2019-07-07 NOTE — Progress Notes (Signed)
    DATE:  07/06/2019                                          X  CHEMO/IMMUNOTHERAPY REACTION             MD:  Dr. Sullivan Lone   AGENT/BLOOD [place]:              FOLFIRI    AGENT/BLOOD PRODUCT RECEIVING IMMEDIATELY PRIOR TO REACTION:           Irinotecan    REACTION(S):            Nausea and vomiting   PREMEDS:      Aloxi and dexamethasone   INTERVENTION: Atropine 0.5 mg IV x1 and Compazine 10 mg IV x1.   Review of Systems  Review of Systems  Constitutional: Negative for chills, diaphoresis and fever.  HENT: Negative for trouble swallowing and voice change.   Respiratory: Negative for cough, chest tightness, shortness of breath and wheezing.   Cardiovascular: Negative for chest pain and palpitations.  Gastrointestinal: Positive for nausea and vomiting. Negative for abdominal pain, constipation and diarrhea.  Musculoskeletal: Negative for back pain and myalgias.  Neurological: Negative for dizziness, light-headedness and headaches.     Physical Exam  Physical Exam Constitutional:      General: She is not in acute distress.    Appearance: She is not diaphoretic.  HENT:     Head: Normocephalic and atraumatic.  Cardiovascular:     Rate and Rhythm: Normal rate and regular rhythm.     Heart sounds: Normal heart sounds. No murmur. No friction rub. No gallop.   Pulmonary:     Effort: Pulmonary effort is normal. No respiratory distress.     Breath sounds: Normal breath sounds. No wheezing or rales.  Skin:    General: Skin is warm and dry.     Findings: No erythema or rash.  Neurological:     Mental Status: She is alert.     OUTCOME:                 The patient's symptoms abated after dosing of atropine and Compazine.  She was able to complete the remainder of her chemotherapy infusion today.   Sandi Mealy, MHS, PA-C

## 2019-07-07 NOTE — Progress Notes (Unsigned)
[place] CSW Progress Notes  Call to patient to check on progress towards goals.  Has appt w [facility] counselor on 10/30 for self and children to receive support for adjustment to her serious illness and how to support children.  Encouraged to attend today's Living with Cancer support group - link provided to join meeting.  Asked to be added to [place] mailing list, Hassan Rowan notified.  Edwyna Shell, LCSW Clinical Social Worker Phone:  [phone] Cell:  [phone]

## 2019-07-07 NOTE — Progress Notes (Signed)
[place] CSW Progress Notes  Follow up phone call with patient to assess progress. Has appt w [facility] for self and children on 10/30.  Started new chemo regimen yesterday.  Provided information on Living with Cancer support group and encouraged her to attend today's meeting.  Also sent more info on [facility].  Edwyna Shell, LCSW Clinical Social Worker Phone:  [phone] Cell:  [phone]

## 2019-07-08 ENCOUNTER — Other Ambulatory Visit: Payer: Self-pay

## 2019-07-08 ENCOUNTER — Inpatient Hospital Stay: Payer: Medicaid Other

## 2019-07-08 VITALS — BP 143/83 | HR 71 | Temp 98.3°F | Resp 18

## 2019-07-08 DIAGNOSIS — Z452 Encounter for adjustment and management of vascular access device: Secondary | ICD-10-CM | POA: Diagnosis not present

## 2019-07-08 DIAGNOSIS — Z7189 Other specified counseling: Secondary | ICD-10-CM

## 2019-07-08 DIAGNOSIS — C189 Malignant neoplasm of colon, unspecified: Secondary | ICD-10-CM

## 2019-07-08 MED ORDER — HEPARIN SOD (PORK) LOCK FLUSH 100 UNIT/ML IV SOLN
500.0000 [IU] | Freq: Once | INTRAVENOUS | Status: AC | PRN
  Administered 2019-07-08: 500 [IU]
  Filled 2019-07-08: qty 5

## 2019-07-08 MED ORDER — SODIUM CHLORIDE 0.9% FLUSH
10.0000 mL | INTRAVENOUS | Status: DC | PRN
  Administered 2019-07-08: 10 mL
  Filled 2019-07-08: qty 10

## 2019-07-10 ENCOUNTER — Other Ambulatory Visit: Payer: Self-pay

## 2019-07-10 ENCOUNTER — Emergency Department (HOSPITAL_COMMUNITY)
Admission: EM | Admit: 2019-07-10 | Discharge: 2019-07-10 | Disposition: A | Discharge status: Home / Self Care | Payer: Medicaid Other | Attending: Emergency Medicine | Admitting: Emergency Medicine

## 2019-07-10 ENCOUNTER — Emergency Department (HOSPITAL_COMMUNITY): Payer: Medicaid Other

## 2019-07-10 ENCOUNTER — Encounter (HOSPITAL_COMMUNITY): Payer: Self-pay

## 2019-07-10 DIAGNOSIS — R1011 Right upper quadrant pain: Secondary | ICD-10-CM | POA: Diagnosis present

## 2019-07-10 DIAGNOSIS — Z87891 Personal history of nicotine dependence: Secondary | ICD-10-CM | POA: Diagnosis not present

## 2019-07-10 DIAGNOSIS — R112 Nausea with vomiting, unspecified: Secondary | ICD-10-CM | POA: Diagnosis not present

## 2019-07-10 DIAGNOSIS — Z79899 Other long term (current) drug therapy: Secondary | ICD-10-CM | POA: Diagnosis not present

## 2019-07-10 DIAGNOSIS — J45909 Unspecified asthma, uncomplicated: Secondary | ICD-10-CM | POA: Diagnosis not present

## 2019-07-10 DIAGNOSIS — I10 Essential (primary) hypertension: Secondary | ICD-10-CM | POA: Insufficient documentation

## 2019-07-10 DIAGNOSIS — Z91040 Latex allergy status: Secondary | ICD-10-CM | POA: Insufficient documentation

## 2019-07-10 LAB — CBC WITH DIFFERENTIAL/PLATELET
Abs Immature Granulocytes: 0.02 10*3/uL (ref 0.00–0.07)
Basophils Absolute: 0 10*3/uL (ref 0.0–0.1)
Basophils Relative: 0 %
Eosinophils Absolute: 0 10*3/uL (ref 0.0–0.5)
Eosinophils Relative: 1 %
HCT: 34.9 % — ABNORMAL LOW (ref 36.0–46.0)
Hemoglobin: 10.3 g/dL — ABNORMAL LOW (ref 12.0–15.0)
Immature Granulocytes: 0 %
Lymphocytes Relative: 25 %
Lymphs Abs: 1.6 10*3/uL (ref 0.7–4.0)
MCH: 27.9 pg (ref 26.0–34.0)
MCHC: 29.5 g/dL — ABNORMAL LOW (ref 30.0–36.0)
MCV: 94.6 fL (ref 80.0–100.0)
Monocytes Absolute: 0.1 10*3/uL (ref 0.1–1.0)
Monocytes Relative: 1 %
Neutro Abs: 4.5 10*3/uL (ref 1.7–7.7)
Neutrophils Relative %: 73 %
Platelets: 206 10*3/uL (ref 150–400)
RBC: 3.69 MIL/uL — ABNORMAL LOW (ref 3.87–5.11)
RDW: 16.5 % — ABNORMAL HIGH (ref 11.5–15.5)
WBC: 6.2 10*3/uL (ref 4.0–10.5)
nRBC: 0 % (ref 0.0–0.2)

## 2019-07-10 LAB — COMPREHENSIVE METABOLIC PANEL
ALT: 14 U/L (ref 0–44)
AST: 14 U/L — ABNORMAL LOW (ref 15–41)
Albumin: 2.8 g/dL — ABNORMAL LOW (ref 3.5–5.0)
Alkaline Phosphatase: 73 U/L (ref 38–126)
Anion gap: 9 (ref 5–15)
BUN: 23 mg/dL — ABNORMAL HIGH (ref 6–20)
CO2: 26 mmol/L (ref 22–32)
Calcium: 8.3 mg/dL — ABNORMAL LOW (ref 8.9–10.3)
Chloride: 107 mmol/L (ref 98–111)
Creatinine, Ser: 0.9 mg/dL (ref 0.44–1.00)
GFR calc Af Amer: >60 mL/min (ref >60)
GFR calc non Af Amer: >60 mL/min (ref >60)
Glucose, Bld: 94 mg/dL (ref 70–99)
Potassium: 4 mmol/L (ref 3.5–5.1)
Sodium: 142 mmol/L (ref 135–145)
Total Bilirubin: 0.7 mg/dL (ref 0.3–1.2)
Total Protein: 6.7 g/dL (ref 6.5–8.1)

## 2019-07-10 LAB — URINALYSIS, ROUTINE W REFLEX MICROSCOPIC
Bilirubin Urine: NEGATIVE
Glucose, UA: NEGATIVE mg/dL
Hgb urine dipstick: NEGATIVE
Ketones, ur: NEGATIVE mg/dL
Leukocytes,Ua: NEGATIVE
Nitrite: NEGATIVE
Protein, ur: NEGATIVE mg/dL
Specific Gravity, Urine: 1.018 (ref 1.005–1.030)
pH: 5 (ref 5.0–8.0)

## 2019-07-10 LAB — PROTIME-INR
INR: 1.1 (ref 0.8–1.2)
Prothrombin Time: 13.7 seconds (ref 11.4–15.2)

## 2019-07-10 LAB — LIPASE, BLOOD: Lipase: 25 U/L (ref 11–51)

## 2019-07-10 MED ORDER — ONDANSETRON HCL 4 MG/2ML IJ SOLN
4.0000 mg | Freq: Once | INTRAMUSCULAR | Status: AC
  Administered 2019-07-10: 11:00:00 4 mg via INTRAVENOUS
  Filled 2019-07-10: qty 2

## 2019-07-10 MED ORDER — SODIUM CHLORIDE 0.9 % IV BOLUS
1000.0000 mL | Freq: Once | INTRAVENOUS | Status: AC
  Administered 2019-07-10: 1000 mL via INTRAVENOUS

## 2019-07-10 MED ORDER — HEPARIN SOD (PORK) LOCK FLUSH 100 UNIT/ML IV SOLN
500.0000 [IU] | Freq: Once | INTRAVENOUS | Status: AC
  Administered 2019-07-10: 17:00:00 500 [IU]
  Filled 2019-07-10: qty 5

## 2019-07-10 MED ORDER — FENTANYL CITRATE (PF) 100 MCG/2ML IJ SOLN
100.0000 ug | INTRAMUSCULAR | Status: DC | PRN
  Administered 2019-07-10: 16:00:00 100 ug via INTRAVENOUS
  Filled 2019-07-10: qty 2

## 2019-07-10 MED ORDER — ONDANSETRON HCL 8 MG PO TABS: 8.0000 mg | ORAL_TABLET | Freq: Three times a day (TID) | ORAL | 0 refills | Status: AC | PRN

## 2019-07-10 MED ORDER — OXYCODONE-ACETAMINOPHEN 5-325 MG PO TABS: 1.0000 | ORAL_TABLET | ORAL | 0 refills | Status: DC | PRN

## 2019-07-10 NOTE — ED Notes (Signed)
Ultrasound at bedside

## 2019-07-10 NOTE — ED Provider Notes (Signed)
[facility] DEPT Provider Note   CSN: [id number] Arrival date & time: 07/10/19  [id number]     History   Chief Complaint Chief Complaint  Patient presents with  . Abdominal Pain    HPI Margaret [name] is a 48 y.o. female.     HPI   She presents for evaluation of recurrent abdominal pain associated with nausea and vomiting, which started yesterday.  Symptoms progressed overnight so she came here for evaluation.  She started new chemotherapy, for liver cancer, 5 days ago and continued an effusion through her port, until 3 days ago, at home.  She denies fever, chills, cough, shortness of breath, weakness or dizziness.  She has not had any pain medicine at home.  She has a history of gallstones.  There are no other known modifying factors.  Past Medical History:  Diagnosis Date  . Allergic rhinitis   . Anxiety diagnosed in 1990  . Arthritis   . Asthma   . colon ca with liver mets dx'd 08/2018  . Dyspnea    with low blood count hx of anemia  . Eczema   . History of blood transfusion   . Hypertension   . Lower back pain   . Migraine   . Sleep apnea    uses CPAP    Patient Active Problem List   Diagnosis Date Noted  . Port-A-Cath in place 11/03/2018  . Counseling regarding advance care planning and goals of care 09/06/2018  . Colon cancer metastasized to liver ([place])   . Hypokalemia 09/01/2018  . Aspiration pneumonia ([place]) 08/30/2018  . Subcapsular hematoma of liver 08/27/2018  . Obesity, Class III, BMI 40-49.9 (morbid obesity) ([place]) 08/27/2018  . Metastatic carcinoma to liver ([place]) 08/27/2018  . B12 deficiency anemia 08/27/2018  . Dizziness   . Acute blood loss anemia   . Iron deficiency anemia 04/28/2018  . Symptomatic anemia 04/09/2018  . Chest pain 04/09/2018  . Anxiety   . Asthma   . Hypertension   . Migraine     Past Surgical History:  Procedure Laterality Date  . ABDOMINAL HYSTERECTOMY    . cortisone injections     knees  bilat and back   . IR IMAGING GUIDED PORT INSERTION  09/14/2018  . iron infusion    . LIVER BIOPSY       OB History   No obstetric history on file.      Home Medications    Prior to Admission medications   Medication Sig Start Date End Date Taking? Authorizing Provider  albuterol (PROAIR HFA) 108 (90 Base) MCG/ACT inhaler INHALE TWO puffs BY MOUTH into the lungs EVERY 4 HOURS AS NEEDED FOR WHEEZING OR shortness of breath Patient taking differently: Inhale 2 puffs into the lungs every 4 (four) hours as needed for wheezing or shortness of breath.  03/10/19  Yes Valentina Shaggy, MD  Boric Acid 4 % SOLN Place 1 capsule vaginally every Friday.    Yes [provider]  budesonide-formoterol (SYMBICORT) 160-4.5 MCG/ACT inhaler Inhale 2 puffs into the lungs 2 (two) times a day. 03/10/19  Yes Valentina Shaggy, MD  cetirizine (ZYRTEC) 10 MG tablet Take 1 tablet (10 mg total) by mouth daily. 03/10/19  Yes Valentina Shaggy, MD  clobetasol ointment (TEMOVATE) [id number] % Apply 1 application topically 2 (two) times daily. Use for Lichen Sclerosis   Yes Bovard-Stuckert, Jody, MD  Cyanocobalamin (B-12) 1000 MCG SUBL Place 1,000 mcg under the tongue daily. 04/28/18  Yes  Brunetta Genera, MD  dexamethasone (DECADRON) 4 MG tablet Take 2 tablets (8 mg total) by mouth daily. Start the day after chemo for 2 days. 07/04/19  Yes Brunetta Genera, MD  diazepam (VALIUM) 5 MG tablet Take 1 tablet (5 mg total) by mouth daily as needed for anxiety (panic attacks). 11/19/18  Yes Brunetta Genera, MD  diclofenac sodium (VOLTAREN) 1 % GEL Apply 2 g topically 4 (four) times daily as needed (for back/knee pain.).    Yes [provider]  dicyclomine (BENTYL) 20 MG tablet Take 1 tablet (20 mg total) by mouth 2 (two) times daily. Patient taking differently: Take 20 mg by mouth 3 (three) times daily before meals.  04/16/18  Yes Doristine Devoid, PA-C  dronabinol (MARINOL) 5 MG capsule Take 1  capsule (5 mg total) by mouth 2 (two) times daily before a meal. 01/17/19  Yes Brunetta Genera, MD  ergocalciferol (VITAMIN D2) 50000 UNITS capsule Take 50,000 Units by mouth every Monday.    Yes [provider]  lisinopril-hydrochlorothiazide (PRINZIDE,ZESTORETIC) 20-12.5 MG tablet Take 1 tablet by mouth daily.   Yes [provider]  loperamide (IMODIUM A-D) 2 MG tablet Take 1 tablet (2 mg total) by mouth 4 (four) times daily as needed. Take 2 at diarrhea onset , then 1 every 2hr until 12hrs with no BM. May take 2 every 4hrs at night. If diarrhea recurs repeat. 07/04/19  Yes Brunetta Genera, MD  LORazepam (ATIVAN) 1 MG tablet Take 1 tablet (1 mg total) by mouth every 6 (six) hours as needed (NAUSEA). 07/04/19  Yes Brunetta Genera, MD  montelukast (SINGULAIR) 10 MG tablet Take 1 tablet (10 mg total) by mouth at bedtime. 03/10/19  Yes Valentina Shaggy, MD  nystatin-triamcinolone [facility] II) cream Apply 1 application topically 2 (two) times daily as needed (irritation). Use in skin folds and under breast    Yes Bovard-Stuckert, Jody, MD  Olopatadine HCl (PAZEO) 0.7 % SOLN Apply 1 drop to eye daily. 03/11/19  Yes Valentina Shaggy, MD  pantoprazole (PROTONIX) 40 MG tablet Take 1 tablet (40 mg total) by mouth daily before breakfast. 03/10/19  Yes Valentina Shaggy, MD  pimecrolimus (ELIDEL) 1 % cream Apply 1 application topically 2 (two) times daily. 07/06/18  Yes Valentina Shaggy, MD  prochlorperazine (COMPAZINE) 10 MG tablet Take 1 tablet (10 mg total) by mouth every 6 (six) hours as needed (NAUSEA). 07/04/19  Yes Brunetta Genera, MD  saccharomyces boulardii (FLORASTOR) 250 MG capsule Take 1 capsule (250 mg total) by mouth 2 (two) times daily. 09/03/18  Yes Georgette Shell, MD  triamcinolone (NASACORT) 55 MCG/ACT AERO nasal inhaler Place 2 sprays into the nose daily as needed (for allergies). 03/10/19  Yes Valentina Shaggy, MD  valACYclovir  (VALTREX) 500 MG tablet Take 500 mg by mouth 2 (two) times daily as needed (for cold sores).    Yes [provider]  ondansetron (ZOFRAN) 8 MG tablet Take 1 tablet (8 mg total) by mouth every 8 (eight) hours as needed for nausea or vomiting. 07/10/19   Daleen Bo, MD  oxyCODONE-acetaminophen (PERCOCET/ROXICET) 5-325 MG tablet Take 1 tablet by mouth every 4 (four) hours as needed for severe pain. 07/10/19   Daleen Bo, MD  promethazine (PHENERGAN) 25 MG tablet Take 1 tablet (25 mg total) by mouth every 6 (six) hours as needed for nausea. Patient not taking: Reported on 07/10/2019 06/08/19   Brunetta Genera, MD  traMADol Veatrice Bourbon) 50  MG tablet Take 1-2 tablets (50-100 mg total) by mouth every 6 (six) hours as needed for moderate pain or severe pain. Patient not taking: Reported on 07/10/2019 02/22/19   Brunetta Genera, MD    Family History No family history on file.  Social History Social History   Tobacco Use  . Smoking status: Former Smoker    Quit date: 07/30/1999    Years since quitting: 19.9  . Smokeless tobacco: Never Used  Substance Use Topics  . Alcohol use: No  . Drug use: No     Allergies   Latex, Tape, and Tylenol with codeine #3 [acetaminophen-codeine]   Review of Systems Review of Systems  All other systems reviewed and are negative.    Physical Exam Updated Vital Signs BP 134/76   Pulse 77   Temp 98.3 F (36.8 C) (Oral)   Resp 15   Ht 5\' 8"  (1.727 m)   Wt (!) 184.2 kg   SpO2 98%   BMI 61.73 kg/m   Physical Exam Vitals signs and nursing note reviewed.  Constitutional:      General: She is not in acute distress.    Appearance: She is well-developed. She is obese. She is not ill-appearing, toxic-appearing or diaphoretic.  HENT:     Head: Normocephalic and atraumatic.     Right Ear: External ear normal.     Left Ear: External ear normal.  Eyes:     Conjunctiva/sclera: Conjunctivae normal.     Pupils: Pupils are equal, round,  and reactive to light.  Neck:     Musculoskeletal: Normal range of motion and neck supple.     Trachea: Phonation normal.  Cardiovascular:     Rate and Rhythm: Normal rate and regular rhythm.     Heart sounds: Normal heart sounds.  Pulmonary:     Effort: Pulmonary effort is normal.     Breath sounds: Normal breath sounds.  Abdominal:     General: There is no distension.     Palpations: Abdomen is soft. There is no mass.     Tenderness: There is abdominal tenderness (Mild mid right upper quadrant tenderness.). There is no guarding.     Hernia: No hernia is present.  Musculoskeletal: Normal range of motion.  Skin:    General: Skin is warm and dry.  Neurological:     Mental Status: She is alert and oriented to person, place, and time.     Cranial Nerves: No cranial nerve deficit.     Sensory: No sensory deficit.     Motor: No abnormal muscle tone.     Coordination: Coordination normal.  Psychiatric:        Mood and Affect: Mood normal.        Behavior: Behavior normal.        Thought Content: Thought content normal.        Judgment: Judgment normal.      ED Treatments / Results  Labs (all labs ordered are listed, but only abnormal results are displayed) Labs Reviewed  COMPREHENSIVE METABOLIC PANEL - Abnormal; Notable for the following components:      Result Value   BUN 23 (*)    Calcium 8.3 (*)    Albumin 2.8 (*)    AST 14 (*)    All other components within normal limits  CBC WITH DIFFERENTIAL/PLATELET - Abnormal; Notable for the following components:   RBC 3.69 (*)    Hemoglobin 10.3 (*)    HCT 34.9 (*)    MCHC  29.5 (*)    RDW 16.5 (*)    All other components within normal limits  LIPASE, BLOOD  URINALYSIS, ROUTINE W REFLEX MICROSCOPIC  PROTIME-INR    EKG None  Radiology US Abdomen Complete  Result Date: 07/10/2019 CLINICAL DATA:  Upper abdominal pain. History of metastatic colon carcinoma. Morbid obesity. EXAM: ABDOMEN ULTRASOUND COMPLETE COMPARISON:  CT  abdomen and pelvis June 15, 2019 FINDINGS: Gallbladder: Within the gallbladder, there is a 1.1 cm echogenic focus which moves in shadows consistent with cholelithiasis. There is no appreciable gallbladder wall thickening or pericholecystic fluid. No sonographic Murphy sign noted by sonographer. Common bile duct: Diameter: 3 mm. No intrahepatic, common hepatic, or common bile duct dilatation. Liver: No focal lesion identified. The liver shows inhomogeneous appearance with overall increased echogenicity period portal vein is patent on color Doppler imaging with normal direction of blood flow towards the liver. IVC: No abnormality visualized. Pancreas: Visualized portion unremarkable. Portions of pancreas obscured by gas. Spleen: Size and appearance within normal limits. Right Kidney: Length: 9.8 cm. Echogenicity within normal limits. No mass or hydronephrosis visualized. Left Kidney: Length: 10.7 cm. Echogenicity within normal limits. No mass or hydronephrosis visualized. Abdominal aorta: No aneurysm visualized. Other findings: No demonstrable ascites. IMPRESSION: 1. Cholelithiasis. No appreciable gallbladder wall thickening or pericholecystic fluid. 2. Diffuse increase in liver echogenicity, a finding indicative of hepatic steatosis. The known liver masses seen on recent CT are not well delineated by ultrasound, likely due to compromised secondary to underlying hepatic steatosis. 3. Portions of pancreas obscured by gas. Visualized portions of pancreas appear unremarkable. 4.  Study otherwise unremarkable. Electronically Signed   By: Lowella Grip III M.D.   On: 07/10/2019 15:43    Procedures Procedures (including critical care time)  Medications Ordered in ED Medications  fentaNYL (SUBLIMAZE) injection 100 mcg (100 mcg Intravenous Given 07/10/19 1613)  heparin lock flush 100 unit/mL (has no administration in time range)  ondansetron (ZOFRAN) injection 4 mg (4 mg Intravenous Given 07/10/19 1039)   sodium chloride 0.9 % bolus 1,000 mL (0 mLs Intravenous Stopped 07/10/19 1444)     Initial Impression / Assessment and Plan / ED Course  I have reviewed the triage vital signs and the nursing notes.  Pertinent labs & imaging results that were available during my care of the patient were reviewed by me and considered in my medical decision making (see chart for details).         Patient Vitals for the past 24 hrs:  BP Temp Temp src Pulse Resp SpO2 Height Weight  07/10/19 1600 134/76 - - 77 - 98 % - -  07/10/19 1545 - - - 77 - 100 % - -  07/10/19 1530 128/71 - - 79 - 100 % - -  07/10/19 1515 - - - 90 - 98 % - -  07/10/19 1500 116/81 - - 77 - 98 % - -  07/10/19 1454 115/75 - - 78 15 100 % - -  07/10/19 1345 - - - 77 - 100 % - -  07/10/19 1330 (!) 127/98 - - 75 - 100 % - -  07/10/19 1315 - - - 79 - 100 % - -  07/10/19 1300 130/87 - - 77 - 100 % - -  07/10/19 1130 (!) 100/59 - - - - - - -  07/10/19 0932 - - - - - - 5\' 8"  (1.727 m) (!) 184.2 kg  07/10/19 0928 - - - - - 96 % - -  07/10/19 [id number]  110/61 98.3 F (36.8 C) Oral 93 16 100 % 5\' 8"  (1.727 m) (!) 184.2 kg    4:29 PM Reevaluation with update and discussion. After initial assessment and treatment, an updated evaluation reveals she is more comfortable now, findings discussed with the patient and all questions were answered. Daleen Bo   Medical Decision Making: Nausea and vomiting, with mild abdominal pain, post chemotherapy infusion.  Patient is chronic gallbladder disease.  Doubt cholecystitis.  Doubt colitis.  Suspect chemotherapy-induced nausea vomiting with secondary abdominal pain.  Patient improved after treatment and stable for discharge with outpatient management.  CRITICAL CARE-no Performed by: Daleen Bo  Nursing Notes Reviewed/ Care Coordinated Applicable Imaging Reviewed Interpretation of Laboratory Data incorporated into ED treatment  The patient appears reasonably screened and/or stabilized for discharge  and I doubt any other medical condition or other [facility] requiring further screening, evaluation, or treatment in the ED at this time prior to discharge.  Plan: Home Medications-continue current; Home Treatments-gradual advance diet; return here if the recommended treatment, does not improve the symptoms; Recommended follow up-oncology follow-up as soon as possible.   Final Clinical Impressions(s) / ED Diagnoses   Final diagnoses:  Right upper quadrant abdominal pain  Non-intractable vomiting with nausea, unspecified vomiting type    ED Discharge Orders         Ordered    oxyCODONE-acetaminophen (PERCOCET/ROXICET) 5-325 MG tablet  Every 4 hours PRN     07/10/19 1628    ondansetron (ZOFRAN) 8 MG tablet  Every 8 hours PRN     07/10/19 1628           Daleen Bo, MD 07/11/19 1443

## 2019-07-10 NOTE — Discharge Instructions (Addendum)
Start with a clear liquid diet then gradually advance to regular foods after a day or 2.  Call your oncologist in the morning to arrange for further care and treatment as soon as possible.  Return here, if needed, for problems.

## 2019-07-10 NOTE — ED Triage Notes (Signed)
Patient presented to ed with c/o right side abdominal pain RUQ started yesterday. Patient state her pain got worse this morning. Liver cancer patient started on new medication last wed.

## 2019-07-10 NOTE — ED Notes (Signed)
Pure wick has been placed. Suction set to 45mmHg.  

## 2019-07-11 ENCOUNTER — Other Ambulatory Visit: Payer: Self-pay | Admitting: Hematology

## 2019-07-11 MED ORDER — OXYCODONE-ACETAMINOPHEN 5-325 MG PO TABS: 1.0000 | ORAL_TABLET | ORAL | 0 refills | Status: DC | PRN

## 2019-07-12 ENCOUNTER — Telehealth: Payer: Self-pay | Admitting: *Deleted

## 2019-07-12 ENCOUNTER — Encounter: Payer: Self-pay | Admitting: Hematology

## 2019-07-12 NOTE — Telephone Encounter (Signed)
Contacted patient in response to MyChart message. Advised patient that Dr.Kale sent refill of pain medicine to pharmacy yesterday - she stated pharmacy had not called her.  Advised her to make oral rinse with 1 cup warm water+1/4 tsp baking soda+1/8 tsp salt and use every hour to rinse mouth to help with mouth sores. Also advised her that Dr.Kale will be informed and if he prescribes anything else, will contact her.  She verbalized understanding of directions and states she will try oral rinse.

## 2019-07-12 NOTE — Telephone Encounter (Signed)
Late entry for 07/11/2019: Patient called. Was in ED over weekend with abd pain and n/v and was given Rx for zofran and for oxycodone 5-325, take 1 tablet q4 for pain #20 by ED MD. She is requesting a prescription for pain medication. States still having abd pain 7-8 out of 10. Information given to Dr.Kale. Dr. Irene Limbo refilled pain medication.

## 2019-07-18 NOTE — Progress Notes (Signed)
HEMATOLOGY/ONCOLOGY CLINIC NOTE  Date of Service: 07/19/2019  Patient Care Team: Nolene Ebbs, MD as PCP - General (Internal Medicine) Jackelyn Knife, MD as Rounding Team (Internal Medicine)   CHIEF COMPLAINTS/PURPOSE OF CONSULTATION:   Continued mx of  Metastatic Adenocarcinoma of colon   HISTORY OF PRESENTING ILLNESS:  Margaret [name] is a wonderful 48 y.o. female who has been referred to [place] by Dr. Nolene Ebbs for evaluation and management of Iron deficiency anemia. The pt reports that she is doing well overall.   The pt recently presented to the ED on 04/16/18 for right sided abdominal pain that was evaluated with a CT A/P which revealed liver and pancreatic cyst, recommended for outpatient MRI follow up. The pt was found to have a UTI, was treated with Rocephin and discharged with Keflex. Prior to this the pt was admitted on 04/09/18 for symptomatic anemia, presenting with HGB at 6.2, received 2 units PRBCs, one IV Iron infusion, and was encouraged to seek out GI as outpatient.  She has an appointment with [facility] on 05/19/18.   The pt reports that she is still feeling dizzy and light headed and denies feeling better after her recent blood transfusion. She notes that she dizziness presents with a feeling of warmness and that she begins hyperventilating, feeling nervous about her dizziness.  She notes that prior to her recent hospital stay, she never required a blood transfusion or IV iron replacement. She notes that as a child she had frequent nose bleeds, and was diagnosed with anemia. She also notes a history of ice picca.   She had a hysterectomy in 2012, and prior to this she had very heavy periods that began at age 60 and occurred almost constantly. She was on Depo and Mirena which did not help slow menstrual losses. Besides taking iron supplements while pregnant she has not taken PO iron replacement as she reports not tolerating it very well while pregnant.   She denies  concern for black stools or blood in the stools and has not had a colonoscopy or endoscopy before. The pt notes that she has not previously had a concern for stomach ulcers, but has acid reflux and takes Prevacid as needed for 7-8 years. She notes that she has not recently used Prevacid, for the last 3 months.  She is not taking vitamin replacements and denies any dietary restrictions.  The pt notes that she has been continuing to have lower right quadrant abdominal pain that radiates across her abdomen, presents for up to 45 minutes and occurs intermittently. She notes that her abdominal pain presented on 04/13/18.   The pt notes that was taking Ibuprofen '800mg'$  BID for 8 years to address her back pain related to her herniated disk. She stopped taking this 4 months ago.   Of note prior to the patient's visit today, pt has had CT A/P completed on 04/16/18 with results revealing No acute findings in the abdomen/pelvis. Least 4 low-density hepatic lesions, some of which may represent small cysts or hemangiomas, however there is a 19 mm lesion in the left lobe of the liver that is incompletely characterized. Recommend nonemergent hepatic protocol MRI for further evaluation.Marland Kitchen Possible 11 mm pancreatic nodule rising from the tail, alternatively this may represent a splenule. This can also be assessed at time of hepatic MRI. Gallstone without gallbladder inflammation. Colonic diverticulosis without diverticulitis.   Most recent lab results (04/16/18) of CBC is as follows: all values are WNL except for HGB at  9.5, HCT at 33.6, MCH at 23.8, MCHC at 28.3, RDW at 23.2, PLT at 435k. Ferritin 04/09/18 was low at 6 Vitamin B12 on 04/09/18 was at 196  On review of systems, pt reports light headedness, dizziness, lower back pain, intermittent abdominal pain, and denies black stools, blood in the stools, tingling or numbness in her legs or arms, vaginal bleeding, and any other symptoms.   INTERVAL HISTORY:   Margaret [name] returns today for management, evaluation of her newly diagnosed Metastatic Adenocarcinoma of GI primary. She is here for C2D1 of FOLFIRI. The patient's last visit with [place] was on 07/06/2019. The pt reports that she is doing well overall.  The pt reports that she had a right upper abdominal pain that began on Saturday night and it became so intense that she went to the ER on Sunday morning. She was feeling like there was some tightness and some twisting in her bowels and notes that her bowel movements around this time were more intense. She did get a prescription for Fentanyl upon discharge but has not needed any. The pain has resolved on it's own and she is not having any more abdominal pain. She has been able to get up and walk around her apartment complex. Pt states that she has been eating well and trying to stick to vegetables and fruits, as well as fish and chicken. She does have Ensure supplements at home that she can use. Pt states that she is feeling more like herself and is having more good days. Pt does note some SOB when she has to walk long distances. She did have some issues with mouth sores but used a baking soda rinse and it had since resolved. She is working on getting her affairs in order.   Lab results today (07/19/19) of CBC w/diff and CMP is as follows: all values are WNL except for WBC at 2.6K, RBC at 3.37, Hgb at 9.3, HCT at 31.1, MCHC at 29.9, RDW at 16.5, Neutro Abs at 1.0K, Albumin at 2.4, AST at 14.   On review of systems, pt reports SOB and denies fevers, chills, infections issues, bloody/black stools, abdominal pain, mouth sores, chest pain, dysuria and any other symptoms.   MEDICAL HISTORY:  Past Medical History:  Diagnosis Date   Allergic rhinitis    Anxiety diagnosed in 1990   Arthritis    Asthma    colon ca with liver mets dx'd 08/2018   Dyspnea    with low blood count hx of anemia   Eczema    History of blood transfusion    Hypertension     Lower back pain    Migraine    Sleep apnea    uses CPAP    SURGICAL HISTORY: Past Surgical History:  Procedure Laterality Date   ABDOMINAL HYSTERECTOMY     cortisone injections     knees bilat and back    IR IMAGING GUIDED PORT INSERTION  09/14/2018   iron infusion     LIVER BIOPSY      SOCIAL HISTORY: Social History   Socioeconomic History   Marital status: Single    Spouse name: Not on file   Number of children: Not on file   Years of education: Not on file   Highest education level: Not on file  Occupational History   Not on file  Social Needs   Financial resource strain: Not on file   Food insecurity    Worry: Not on file  Inability: Not on file   Transportation needs    Medical: Not on file    Non-medical: Not on file  Tobacco Use   Smoking status: Former Smoker    Quit date: 07/30/1999    Years since quitting: 19.9   Smokeless tobacco: Never Used  Substance and Sexual Activity   Alcohol use: No   Drug use: No   Sexual activity: Not on file  Lifestyle   Physical activity    Days per week: Not on file    Minutes per session: Not on file   Stress: Not on file  Relationships   Social connections    Talks on phone: Not on file    Gets together: Not on file    Attends religious service: Not on file    Active member of club or organization: Not on file    Attends meetings of clubs or organizations: Not on file    Relationship status: Not on file   Intimate partner violence    Fear of current or ex partner: Not on file    Emotionally abused: Not on file    Physically abused: Not on file    Forced sexual activity: Not on file  Other Topics Concern   Not on file  Social History Narrative   Not on file    FAMILY HISTORY: No family history on file.  ALLERGIES:  is allergic to latex; tape; and tylenol with codeine #3 [acetaminophen-codeine].  MEDICATIONS:  Current Outpatient Medications  Medication Sig Dispense Refill     albuterol (PROAIR HFA) 108 (90 Base) MCG/ACT inhaler INHALE TWO puffs BY MOUTH into the lungs EVERY 4 HOURS AS NEEDED FOR WHEEZING OR shortness of breath 18 g 1   Boric Acid 4 % SOLN Place 1 capsule vaginally every Friday.      budesonide-formoterol (SYMBICORT) 160-4.5 MCG/ACT inhaler Inhale 2 puffs into the lungs 2 (two) times a day. 10.2 g 5   cetirizine (ZYRTEC) 10 MG tablet Take 1 tablet (10 mg total) by mouth daily. 30 tablet 5   clobetasol ointment (TEMOVATE) 5.17 % Apply 1 application topically 2 (two) times daily. Use for Lichen Sclerosis     Cyanocobalamin (B-12) 1000 MCG SUBL Place 1,000 mcg under the tongue daily. 30 each 3   dexamethasone (DECADRON) 4 MG tablet TAKE 2 TABLETS (8 MG TOTAL) BY MOUTH DAILY  START THE DAY AFTER CHEMOTHERAPY FOR 2 DAYS. TAKE WITH FOOD. 30 tablet 1   diazepam (VALIUM) 5 MG tablet Take 1 tablet (5 mg total) by mouth daily as needed for anxiety (panic attacks). 30 tablet 0   diclofenac sodium (VOLTAREN) 1 % GEL Apply 2 g topically 4 (four) times daily as needed (for back/knee pain.).      dicyclomine (BENTYL) 20 MG tablet Take 1 tablet (20 mg total) by mouth 2 (two) times daily. (Patient taking differently: Take 20 mg by mouth 3 (three) times daily before meals. ) 20 tablet 0   dronabinol (MARINOL) 5 MG capsule Take 1 capsule (5 mg total) by mouth 2 (two) times daily before a meal. 60 capsule 0   ergocalciferol (VITAMIN D2) 50000 UNITS capsule Take 50,000 Units by mouth every Monday.      lidocaine-prilocaine (EMLA) cream Apply to affected area once 30 g 3   lisinopril-hydrochlorothiazide (PRINZIDE,ZESTORETIC) 20-12.5 MG tablet Take 1 tablet by mouth daily.     methocarbamol (ROBAXIN) 750 MG tablet Take 750 mg by mouth every 8 (eight) hours as needed (back spasms.).  montelukast (SINGULAIR) 10 MG tablet Take 1 tablet (10 mg total) by mouth at bedtime. 30 tablet 6   nystatin-triamcinolone (MYCOLOG II) cream Apply 1 application topically 2  (two) times daily as needed (irritation). Use in skin folds and under breast      Olopatadine HCl (PAZEO) 0.7 % SOLN Apply 1 drop to eye daily. 2.5 mL 5   ondansetron (ZOFRAN) 8 MG tablet Take 1 tablet (8 mg total) by mouth 2 (two) times daily as needed for refractory nausea / vomiting. Start on day 3 after chemotherapy. 30 tablet 0   pantoprazole (PROTONIX) 40 MG tablet Take 1 tablet (40 mg total) by mouth daily before breakfast. 30 tablet 1   pimecrolimus (ELIDEL) 1 % cream Apply 1 application topically 2 (two) times daily. 30 g 6   prochlorperazine (COMPAZINE) 10 MG tablet Take 1 tablet (10 mg total) by mouth every 6 (six) hours as needed (Nausea or vomiting). 30 tablet 1   saccharomyces boulardii (FLORASTOR) 250 MG capsule Take 1 capsule (250 mg total) by mouth 2 (two) times daily.     traMADol (ULTRAM) 50 MG tablet Take 1-2 tablets (50-100 mg total) by mouth every 6 (six) hours as needed for moderate pain or severe pain. 90 tablet 0   triamcinolone (NASACORT) 55 MCG/ACT AERO nasal inhaler Place 2 sprays into the nose daily as needed (for allergies). 1 Inhaler 5   valACYclovir (VALTREX) 500 MG tablet Take 500 mg by mouth 2 (two) times daily as needed (for cold sores).      No current facility-administered medications for this visit.    Facility-Administered Medications Ordered in Other Visits  Medication Dose Route Frequency Provider Last Rate Last Dose   fluorouracil (ADRUCIL) 6,950 mg in sodium chloride 0.9 % 111 mL chemo infusion  2,400 mg/m2 (Treatment Plan Recorded) Intravenous 1 day or 1 dose Brunetta Genera, MD       leucovorin 1,156 mg in dextrose 5 % 250 mL infusion  400 mg/m2 (Treatment Plan Recorded) Intravenous Once Brunetta Genera, MD 77 mL/hr at 05/24/19 1242 1,156 mg at 05/24/19 1242   oxaliplatin (ELOXATIN) 250 mg in dextrose 5 % 500 mL chemo infusion  250 mg Intravenous Once Brunetta Genera, MD 138 mL/hr at 05/24/19 1245 250 mg at 05/24/19 1245     REVIEW OF SYSTEMS:   A 10+ POINT REVIEW OF SYSTEMS WAS OBTAINED including neurology, dermatology, psychiatry, cardiac, respiratory, lymph, extremities, GI, GU, Musculoskeletal, constitutional, breasts, reproductive, HEENT.  All pertinent positives are noted in the HPI.  All others are negative.   PHYSICAL EXAMINATION:  Vitals:   07/19/19 0833  BP: (!) 151/83  Pulse: 84  Resp: 18  Temp: 98.3 F (36.8 C)  SpO2: 100%    Wt Readings from Last 3 Encounters:  07/19/19 (!) 412 lb 14.4 oz (187.3 kg)  07/10/19 (!) 406 lb (184.2 kg)  07/06/19 (!) 408 lb 1.6 oz (185.1 kg)   Body mass index is 62.78 kg/m.   Exam was given in a chair   GENERAL:alert, in no acute distress and comfortable SKIN: no acute rashes, no significant lesions EYES: conjunctiva are pink and non-injected, sclera anicteric OROPHARYNX: MMM, no exudates, no oropharyngeal erythema or ulceration NECK: supple, no JVD LYMPH:  no palpable lymphadenopathy in the cervical, axillary or inguinal regions LUNGS: clear to auscultation b/l with normal respiratory effort HEART: regular rate & rhythm ABDOMEN:  normoactive bowel sounds , non tender, not distended. No palpable hepatosplenomegaly.  Extremity: no pedal edema PSYCH:  alert & oriented x 3 with fluent speech NEURO: no focal motor/sensory deficits   LABORATORY DATA:  I have reviewed the data as listed  . CBC Latest Ref Rng & Units 07/19/2019 07/10/2019 07/06/2019  WBC 4.0 - 10.5 K/uL 2.6(L) 6.2 5.4  Hemoglobin 12.0 - 15.0 g/dL 9.3(L) 10.3(L) 9.7(L)  Hematocrit 36.0 - 46.0 % 31.1(L) 34.9(L) 32.6(L)  Platelets 150 - 400 K/uL 215 206 178   ANC 900 . CMP Latest Ref Rng & Units 07/19/2019 07/10/2019 07/06/2019  Glucose 70 - 99 mg/dL 92 94 95  BUN 6 - 20 mg/dL 12 23(H) 15  Creatinine 0.44 - 1.00 mg/dL 0.80 0.90 0.78  Sodium 135 - 145 mmol/L 143 142 143  Potassium 3.5 - 5.1 mmol/L 4.1 4.0 4.1  Chloride 98 - 111 mmol/L 110 107 109  CO2 22 - 32 mmol/L '24 26 23   '$ Calcium 8.9 - 10.3 mg/dL 8.9 8.3(L) 8.9  Total Protein 6.5 - 8.1 g/dL 6.7 6.7 6.9  Total Bilirubin 0.3 - 1.2 mg/dL 0.3 0.7 0.4  Alkaline Phos 38 - 126 U/L 81 73 82  AST 15 - 41 U/L 14(L) 14(L) 15  ALT 0 - 44 U/L '9 14 10   '$ . Lab Results  Component Value Date   IRON 53 07/19/2019   TIBC 233 (L) 07/19/2019   IRONPCTSAT 23 07/19/2019   (Iron and TIBC)  Lab Results  Component Value Date   FERRITIN 322 (H) 07/19/2019   B12  - 196--> 584  07/22/18 Liver Biopsy:   08/27/18 Repeat Liver Biopsy:   Molecular Pathology:     RADIOGRAPHIC STUDIES: I have personally reviewed the radiological images as listed and agreed with the findings in the report. US Abdomen Complete  Result Date: 07/10/2019 CLINICAL DATA:  Upper abdominal pain. History of metastatic colon carcinoma. Morbid obesity. EXAM: ABDOMEN ULTRASOUND COMPLETE COMPARISON:  CT abdomen and pelvis June 15, 2019 FINDINGS: Gallbladder: Within the gallbladder, there is a 1.1 cm echogenic focus which moves in shadows consistent with cholelithiasis. There is no appreciable gallbladder wall thickening or pericholecystic fluid. No sonographic Murphy sign noted by sonographer. Common bile duct: Diameter: 3 mm. No intrahepatic, common hepatic, or common bile duct dilatation. Liver: No focal lesion identified. The liver shows inhomogeneous appearance with overall increased echogenicity period portal vein is patent on color Doppler imaging with normal direction of blood flow towards the liver. IVC: No abnormality visualized. Pancreas: Visualized portion unremarkable. Portions of pancreas obscured by gas. Spleen: Size and appearance within normal limits. Right Kidney: Length: 9.8 cm. Echogenicity within normal limits. No mass or hydronephrosis visualized. Left Kidney: Length: 10.7 cm. Echogenicity within normal limits. No mass or hydronephrosis visualized. Abdominal aorta: No aneurysm visualized. Other findings: No demonstrable ascites.  IMPRESSION: 1. Cholelithiasis. No appreciable gallbladder wall thickening or pericholecystic fluid. 2. Diffuse increase in liver echogenicity, a finding indicative of hepatic steatosis. The known liver masses seen on recent CT are not well delineated by ultrasound, likely due to compromised secondary to underlying hepatic steatosis. 3. Portions of pancreas obscured by gas. Visualized portions of pancreas appear unremarkable. 4.  Study otherwise unremarkable. Electronically Signed   By: Lowella Grip III M.D.   On: 07/10/2019 15:43    ASSESSMENT & PLAN:   48 y.o. female with  1. Iron Deficiency Anemia - ? Previous heavy periods vs GI losses  Recent heavy NSAID use ? Ulcer  2. B12 deficiency Labs upon initial presentation from 04/16/18, HGB at 9.5. The 04/09/18 Ferritin was low at  6 and Vitamin B12 was at 196 -No antiparietal antibodies, no intrinsic factor antibodies, normal TSH all from 04/28/18  PLAN: -Continue NSAID avoidance  -continue 1040mg Vitamin B12 sublingually daily - B12 improved from 196 to 584  3.MetastaticAdenocarcinoma of cecum with liver mets. KRAS mutated.  04/16/18 CT A/P with pt revealed No acute findings in the abdomen/pelvis. Least 4 low-density hepatic lesions, some of which may represent small cysts or hemangiomas, however there is a 19 mm lesion in the left lobe of the liver that is incompletely characterized. Recommend nonemergent hepatic protocol MRI for further evaluation..Marland KitchenPossible 11 mm pancreatic nodule rising from the tail, alternatively this may represent a splenule. This can also be assessed at time of hepatic MRI. Gallstone without gallbladder inflammation. Colonic diverticulosis without diverticulitis.   07/09/18 Tumor marker work up: CEA normal at 1.32, AFP normal at 1.5, LDH normal at 138, CA 125 normal at 5.9, CA 19-9 normal at <2   07/22/18 Liver biopsy results revealed benign hepatic tissue, and was not diagnostic   07/16/18 PET/CT  revealedThere is a solid hypermetabolic mass involving the cecum, worrisome for colonic primary neoplasm. Correlation withcolonoscopy Findings. 2. Multiple hypermetabolic liver lesions compatible with metastaticdisease. 3. Large low-density lesion in right lobe of thyroid gland. Advise further evaluation with thyroid sonography. 4. Aortic Atherosclerosis. 5. Small hiatal hernia.   08/27/18 Liver biopsy revealed Metastatic adenocarcinoma, consistent with gastrointestinal primary 08/27/18 Molecular pathology- KRAS mutation positive   09/03/18 ECHO revealed an LV EF of 670-35% mild diastolic dysfunction; mild RVE; dilated PA; mild TR; moderate to severe pulmonary hypertension  09/17/18 CEA at 2.39, which was the last CEA pre-treatment value   12/13/18 CT A/P revealed Interval response to therapy. There is been decrease in size of multifocal liver metastasis. No new or progressive findings. 2. Hiatal hernia.   Did postpone C6 by one week due to concern for mild infection.   Did postpone C7 by two weeks for infection  06/15/2019 CT C/A/P ([id number] ([id number] revealed "1. Significant interval enlargement of ill-defined, hypodense masses of the liver, the largest index mass of the inferior right lobe, segment VI, measuring 4.1 x 3.7 cm, previously 1.0 cm when measured similarly (series 2, image 58). There are additional lesions in caudate (series 2, image 54), lateral right lobe (series 2, image 52) and liver dome (series 2, image 39), all substantially enlarged compared to prior examination. Findings are consistent with worsened metastatic disease. 2. Redemonstrated residual mass at the cecal base appears thickened in comparison to prior examination, approximately 1.2 cm, previously 5 mm when measured similarly (series 4, image 61, series 2, image 110). 3. Stable 2 mm nodules of the left lung base (series 6, image 105, 116), likely benign and incidental. No definite evidence of metastatic disease in  the chest. Attention on follow-up."1. Significant interval enlargement of ill-defined, hypodense masses of the liver, the largest index mass of the inferior right lobe, segment VI, measuring 4.1 x 3.7 cm, previously 1.0 cm when measured similarly (series 2, image 58). There are additional lesions in caudate (series 2, image 54), lateral right lobe (series 2, image 52) and liver dome (series 2, image 39), all substantially enlarged compared to prior examination. Findings are consistent with worsened metastatic disease. 2. Redemonstrated residual mass at the cecal base appears thickened in comparison to prior examination, approximately 1.2 cm, previously 5 mm when measured similarly (series 4, image 61, series 2, image 110). 3. Stable 2 mm nodules of the left lung base (series 6, image  105, 116), likely benign and incidental. No definite evidence of metastatic disease in the chest. Attention on follow-up."  PLAN: -Discussed pt labwork today, 07/19/19; mild anemia, leukopenia, low albumin levels  -Discussed 07/19/2019 Ferritin at 322 -Discussed 07/19/2019 Iron and TIBC is as follows: Iron at 53, TIBC at 233, Sat Ratios at 23, UIBC at 179 -Recommended pt to continue taking weekly Vitamin D -The pt has no prohibitive toxicities from continuing C2 FOLFIRI at this time. Ordered reviewed and signed. -Will consider restarting Avastin on the lowest possible dose depending on how pt tolerates C2 of FOLFIRI -Advised pt to contact if any concerns or changes in symptomology  -Will begin Udenyca injections on C2D3 -Will move C3 to 11/23 per pt wishes  FOLLOW UP: -Udenyca added to D3 of each cycle starting from 11/5 -Please schedule C3 of FOLFIRI starting 11/23 (instead of 11/18 per patient preference) with labs and port flush and visit with Dr Laurann Montana NP (since I am on PAL). -plz schedule C4 of FOLFIRI  Avastin as per orders with labs and MD visit with Dr Irene Limbo  The total time spent in the appt was 25  minutes and more than 50% was on counseling and direct patient cares.  All of the patient's questions were answered with apparent satisfaction. The patient knows to call the clinic with any problems, questions or concerns.   Sullivan Lone MD [place] AAHIVMS [facility] Hematology/Oncology Physician [facility]  (Office):       [phone] (Work cell):  [phone] (Fax):           [phone]  07/19/2019 9:51 AM   I, Yevette Edwards, am acting as a scribe for Dr. Sullivan Lone.   .I have reviewed the above documentation for accuracy and completeness, and I agree with the above. Brunetta Genera MD

## 2019-07-19 ENCOUNTER — Ambulatory Visit: Payer: Medicaid Other | Admitting: Hematology

## 2019-07-19 ENCOUNTER — Other Ambulatory Visit: Payer: Medicaid Other

## 2019-07-19 ENCOUNTER — Inpatient Hospital Stay: Payer: Medicaid Other

## 2019-07-19 ENCOUNTER — Inpatient Hospital Stay: Payer: Medicaid Other | Attending: Hematology

## 2019-07-19 ENCOUNTER — Ambulatory Visit: Payer: Medicaid Other

## 2019-07-19 ENCOUNTER — Inpatient Hospital Stay (HOSPITAL_BASED_OUTPATIENT_CLINIC_OR_DEPARTMENT_OTHER): Payer: Medicaid Other | Admitting: Hematology

## 2019-07-19 ENCOUNTER — Other Ambulatory Visit: Payer: Self-pay

## 2019-07-19 VITALS — BP 151/83 | HR 84 | Temp 98.3°F | Resp 18 | Ht 68.0 in | Wt >= 6400 oz

## 2019-07-19 DIAGNOSIS — C180 Malignant neoplasm of cecum: Secondary | ICD-10-CM | POA: Diagnosis present

## 2019-07-19 DIAGNOSIS — Z95828 Presence of other vascular implants and grafts: Secondary | ICD-10-CM

## 2019-07-19 DIAGNOSIS — C787 Secondary malignant neoplasm of liver and intrahepatic bile duct: Secondary | ICD-10-CM

## 2019-07-19 DIAGNOSIS — E538 Deficiency of other specified B group vitamins: Secondary | ICD-10-CM | POA: Insufficient documentation

## 2019-07-19 DIAGNOSIS — Z87891 Personal history of nicotine dependence: Secondary | ICD-10-CM | POA: Insufficient documentation

## 2019-07-19 DIAGNOSIS — Z7189 Other specified counseling: Secondary | ICD-10-CM

## 2019-07-19 DIAGNOSIS — C189 Malignant neoplasm of colon, unspecified: Secondary | ICD-10-CM | POA: Diagnosis not present

## 2019-07-19 DIAGNOSIS — D509 Iron deficiency anemia, unspecified: Secondary | ICD-10-CM | POA: Diagnosis not present

## 2019-07-19 DIAGNOSIS — M545 Low back pain: Secondary | ICD-10-CM | POA: Insufficient documentation

## 2019-07-19 DIAGNOSIS — K219 Gastro-esophageal reflux disease without esophagitis: Secondary | ICD-10-CM | POA: Diagnosis not present

## 2019-07-19 DIAGNOSIS — R109 Unspecified abdominal pain: Secondary | ICD-10-CM | POA: Diagnosis not present

## 2019-07-19 DIAGNOSIS — R42 Dizziness and giddiness: Secondary | ICD-10-CM | POA: Diagnosis not present

## 2019-07-19 DIAGNOSIS — Z5111 Encounter for antineoplastic chemotherapy: Secondary | ICD-10-CM | POA: Insufficient documentation

## 2019-07-19 DIAGNOSIS — Z5189 Encounter for other specified aftercare: Secondary | ICD-10-CM | POA: Insufficient documentation

## 2019-07-19 LAB — CMP (CANCER CENTER ONLY)
ALT: 9 U/L (ref 0–44)
AST: 14 U/L — ABNORMAL LOW (ref 15–41)
Albumin: 2.4 g/dL — ABNORMAL LOW (ref 3.5–5.0)
Alkaline Phosphatase: 81 U/L (ref 38–126)
Anion gap: 9 (ref 5–15)
BUN: 12 mg/dL (ref 6–20)
CO2: 24 mmol/L (ref 22–32)
Calcium: 8.9 mg/dL (ref 8.9–10.3)
Chloride: 110 mmol/L (ref 98–111)
Creatinine: 0.8 mg/dL (ref 0.44–1.00)
GFR, Est AFR Am: >60 mL/min (ref >60)
GFR, Estimated: >60 mL/min (ref >60)
Glucose, Bld: 92 mg/dL (ref 70–99)
Potassium: 4.1 mmol/L (ref 3.5–5.1)
Sodium: 143 mmol/L (ref 135–145)
Total Bilirubin: 0.3 mg/dL (ref 0.3–1.2)
Total Protein: 6.7 g/dL (ref 6.5–8.1)

## 2019-07-19 LAB — CBC WITH DIFFERENTIAL/PLATELET
Abs Immature Granulocytes: 0.01 10*3/uL (ref 0.00–0.07)
Basophils Absolute: 0 10*3/uL (ref 0.0–0.1)
Basophils Relative: 0 %
Eosinophils Absolute: 0.1 10*3/uL (ref 0.0–0.5)
Eosinophils Relative: 2 %
HCT: 31.1 % — ABNORMAL LOW (ref 36.0–46.0)
Hemoglobin: 9.3 g/dL — ABNORMAL LOW (ref 12.0–15.0)
Immature Granulocytes: 0 %
Lymphocytes Relative: 50 %
Lymphs Abs: 1.3 10*3/uL (ref 0.7–4.0)
MCH: 27.6 pg (ref 26.0–34.0)
MCHC: 29.9 g/dL — ABNORMAL LOW (ref 30.0–36.0)
MCV: 92.3 fL (ref 80.0–100.0)
Monocytes Absolute: 0.3 10*3/uL (ref 0.1–1.0)
Monocytes Relative: 10 %
Neutro Abs: 1 10*3/uL — ABNORMAL LOW (ref 1.7–7.7)
Neutrophils Relative %: 38 %
Platelets: 215 10*3/uL (ref 150–400)
RBC: 3.37 MIL/uL — ABNORMAL LOW (ref 3.87–5.11)
RDW: 16.5 % — ABNORMAL HIGH (ref 11.5–15.5)
WBC: 2.6 10*3/uL — ABNORMAL LOW (ref 4.0–10.5)
nRBC: 0 % (ref 0.0–0.2)

## 2019-07-19 LAB — IRON AND TIBC
Iron: 53 ug/dL (ref 41–142)
Saturation Ratios: 23 % (ref 21–57)
TIBC: 233 ug/dL — ABNORMAL LOW (ref 236–444)
UIBC: 179 ug/dL (ref 120–384)

## 2019-07-19 LAB — FERRITIN: Ferritin: 322 ng/mL — ABNORMAL HIGH (ref 11–307)

## 2019-07-19 MED ORDER — ATROPINE SULFATE 1 MG/ML IJ SOLN
INTRAMUSCULAR | Status: AC
  Filled 2019-07-19: qty 1

## 2019-07-19 MED ORDER — HEPARIN SOD (PORK) LOCK FLUSH 100 UNIT/ML IV SOLN
500.0000 [IU] | Freq: Once | INTRAVENOUS | Status: DC | PRN
  Filled 2019-07-19: qty 5

## 2019-07-19 MED ORDER — LEUCOVORIN CALCIUM INJECTION 350 MG
400.0000 mg/m2 | Freq: Once | INTRAVENOUS | Status: AC
  Administered 2019-07-19: 1200 mg via INTRAVENOUS
  Filled 2019-07-19: qty 60

## 2019-07-19 MED ORDER — IRINOTECAN HCL CHEMO INJECTION 100 MG/5ML
180.0000 mg/m2 | Freq: Once | INTRAVENOUS | Status: AC
  Administered 2019-07-19: 11:00:00 540 mg via INTRAVENOUS
  Filled 2019-07-19: qty 27

## 2019-07-19 MED ORDER — SODIUM CHLORIDE 0.9 % IV SOLN
Freq: Once | INTRAVENOUS | Status: AC
  Administered 2019-07-19: 11:00:00 via INTRAVENOUS
  Filled 2019-07-19: qty 250

## 2019-07-19 MED ORDER — DEXAMETHASONE SODIUM PHOSPHATE 10 MG/ML IJ SOLN
INTRAMUSCULAR | Status: AC
  Filled 2019-07-19: qty 1

## 2019-07-19 MED ORDER — PALONOSETRON HCL INJECTION 0.25 MG/5ML
INTRAVENOUS | Status: AC
  Filled 2019-07-19: qty 5

## 2019-07-19 MED ORDER — SODIUM CHLORIDE 0.9% FLUSH
10.0000 mL | INTRAVENOUS | Status: DC | PRN
  Filled 2019-07-19: qty 10

## 2019-07-19 MED ORDER — DEXAMETHASONE SODIUM PHOSPHATE 10 MG/ML IJ SOLN
10.0000 mg | Freq: Once | INTRAMUSCULAR | Status: AC
  Administered 2019-07-19: 10 mg via INTRAVENOUS

## 2019-07-19 MED ORDER — ATROPINE SULFATE 0.4 MG/ML IJ SOLN
INTRAMUSCULAR | Status: AC
  Filled 2019-07-19: qty 1

## 2019-07-19 MED ORDER — ATROPINE SULFATE 1 MG/ML IJ SOLN
0.4000 mg | Freq: Once | INTRAMUSCULAR | Status: AC
  Administered 2019-07-19: 0.4 mg via INTRAVENOUS

## 2019-07-19 MED ORDER — PALONOSETRON HCL INJECTION 0.25 MG/5ML
0.2500 mg | Freq: Once | INTRAVENOUS | Status: AC
  Administered 2019-07-19: 0.25 mg via INTRAVENOUS

## 2019-07-19 MED ORDER — SODIUM CHLORIDE 0.9% FLUSH
10.0000 mL | Freq: Once | INTRAVENOUS | Status: AC
  Administered 2019-07-19: 08:00:00 10 mL
  Filled 2019-07-19: qty 10

## 2019-07-19 MED ORDER — SODIUM CHLORIDE 0.9 % IV SOLN
Freq: Once | INTRAVENOUS | Status: AC
  Administered 2019-07-19: 10:00:00 via INTRAVENOUS
  Filled 2019-07-19: qty 250

## 2019-07-19 MED ORDER — SODIUM CHLORIDE 0.9 % IV SOLN
2400.0000 mg/m2 | INTRAVENOUS | Status: DC
  Administered 2019-07-19: 7200 mg via INTRAVENOUS
  Filled 2019-07-19: qty 144

## 2019-07-19 NOTE — Patient Instructions (Signed)
[facility] Discharge Instructions for Patients Receiving Chemotherapy  Today you received the following chemotherapy agents Irinotecan, Leucovorin and 5FU  To help prevent nausea and vomiting after your treatment, we encourage you to take your nausea medication as directed.    If you develop nausea and vomiting that is not controlled by your nausea medication, call the clinic.   BELOW ARE SYMPTOMS THAT SHOULD BE REPORTED IMMEDIATELY:  *FEVER GREATER THAN 100.5 F  *CHILLS WITH OR WITHOUT FEVER  NAUSEA AND VOMITING THAT IS NOT CONTROLLED WITH YOUR NAUSEA MEDICATION  *UNUSUAL SHORTNESS OF BREATH  *UNUSUAL BRUISING OR BLEEDING  TENDERNESS IN MOUTH AND THROAT WITH OR WITHOUT PRESENCE OF ULCERS  *URINARY PROBLEMS  *BOWEL PROBLEMS  UNUSUAL RASH Items with * indicate a potential emergency and should be followed up as soon as possible.  Feel free to call the clinic should you have any questions or concerns. The clinic phone number is [phone].  Please show the CHEMO ALERT CARD at check-in to the Emergency Department and triage nurse.   

## 2019-07-19 NOTE — Progress Notes (Signed)
Ok to treat per today's note from Dr. Irene Limbo:  "PLAN: -Discussed pt labwork today, 07/19/19; mild anemia, leukopenia, low albumin levels  -Discussed 07/19/2019 Ferritin at 322 -Discussed 07/19/2019 Iron and TIBC is as follows: Iron at 53, TIBC at 233, Sat Ratios at 23, UIBC at 179 -Recommended pt to continue taking weekly Vitamin D -The pt has no prohibitive toxicities from continuing C2 FOLFIRI at this time. Ordered reviewed and signed. -Will consider restarting Avastin on the lowest possible dose depending on how pt tolerates C2 of FOLFIRI -Advised pt to contact if any concerns or changes in symptomology  -Will begin Udenyca injections on C2D3 -Will move C3 to 11/23 per pt wishes"

## 2019-07-21 ENCOUNTER — Telehealth: Payer: Self-pay | Admitting: *Deleted

## 2019-07-21 ENCOUNTER — Inpatient Hospital Stay: Payer: Medicaid Other

## 2019-07-21 ENCOUNTER — Telehealth: Payer: Self-pay | Admitting: Hematology

## 2019-07-21 ENCOUNTER — Other Ambulatory Visit: Payer: Self-pay

## 2019-07-21 ENCOUNTER — Encounter: Payer: Self-pay | Admitting: Hematology

## 2019-07-21 VITALS — BP 116/78 | HR 80 | Temp 98.5°F | Resp 18

## 2019-07-21 DIAGNOSIS — Z5189 Encounter for other specified aftercare: Secondary | ICD-10-CM | POA: Diagnosis not present

## 2019-07-21 DIAGNOSIS — Z7189 Other specified counseling: Secondary | ICD-10-CM

## 2019-07-21 DIAGNOSIS — C189 Malignant neoplasm of colon, unspecified: Secondary | ICD-10-CM

## 2019-07-21 MED ORDER — SODIUM CHLORIDE 0.9% FLUSH
10.0000 mL | INTRAVENOUS | Status: DC | PRN
  Administered 2019-07-21: 10 mL
  Filled 2019-07-21: qty 10

## 2019-07-21 MED ORDER — HEPARIN SOD (PORK) LOCK FLUSH 100 UNIT/ML IV SOLN
500.0000 [IU] | Freq: Once | INTRAVENOUS | Status: AC | PRN
  Administered 2019-07-21: 500 [IU]
  Filled 2019-07-21: qty 5

## 2019-07-21 MED ORDER — PEGFILGRASTIM-CBQV 6 MG/0.6ML ~~LOC~~ SOSY
6.0000 mg | PREFILLED_SYRINGE | Freq: Once | SUBCUTANEOUS | Status: AC
  Administered 2019-07-21: 6 mg via SUBCUTANEOUS

## 2019-07-21 MED ORDER — PEGFILGRASTIM-CBQV 6 MG/0.6ML ~~LOC~~ SOSY
PREFILLED_SYRINGE | SUBCUTANEOUS | Status: AC
  Filled 2019-07-21: qty 0.6

## 2019-07-21 NOTE — Telephone Encounter (Signed)
Scheduled per los. Called and spoke with patient confirmed appt  

## 2019-07-21 NOTE — Patient Instructions (Signed)

## 2019-07-21 NOTE — Telephone Encounter (Signed)
Handicap Placard application completed and signed by Dr.Kale - patient to pick up at [facility] on 07/21/2019

## 2019-07-31 ENCOUNTER — Encounter: Payer: Self-pay | Admitting: Hematology

## 2019-08-03 ENCOUNTER — Telehealth: Payer: Self-pay | Admitting: Hematology

## 2019-08-03 ENCOUNTER — Ambulatory Visit: Payer: Medicaid Other | Admitting: Hematology

## 2019-08-03 ENCOUNTER — Ambulatory Visit: Payer: Medicaid Other

## 2019-08-03 ENCOUNTER — Other Ambulatory Visit: Payer: Medicaid Other

## 2019-08-03 NOTE — Telephone Encounter (Signed)
Called to let pt know of changes per 11/16 sch message. Pt says not a good time to talk. I just let her know changes have been made and if she checks my chart she should see them and if any changes need to be made just to give [place] a call back . Pt agreed .

## 2019-08-07 IMAGING — XA IR FLUORO GUIDE CV LINE*L*
1 series · 1 of 1 positions shown · non-contrast
Comparison: none

CLINICAL DATA: Cecal carcinoma. Needs durable venous access for
chemotherapy regimen.
TECHNIQUE: The procedure, risks, benefits, and alternatives were explained to
the patient. Questions regarding the procedure were encouraged and
answered. The patient understands and consents to the procedure. As
antibiotic prophylaxis, cefazolin 2 g was ordered pre-procedure and
administered intravenously within one hour of incision. Patency of
the right IJ vein was confirmed with ultrasound with image
documentation. An appropriate skin site was determined. Skin site
was marked. Region was prepped using maximum barrier technique
including cap and mask, sterile gown, sterile gloves, large sterile
sheet, and Chlorhexidine as cutaneous antisepsis. The region was
infiltrated locally with 1% lidocaine. Under real-time ultrasound
guidance, the right IJ vein was accessed with a 21 gauge
micropuncture needle; the needle tip within the vein was confirmed
with ultrasound image documentation. Needle was exchanged over a 018
guidewire for transitional dilator which allowed passage of the
Benson wire into the IVC. Over this, the transitional dilator was
exchanged for a 5 French MPA catheter. A small incision was made on
the right anterior chest wall and a subcutaneous pocket fashioned.
The power-injectable port was positioned and its catheter tunneled
to the right IJ dermatotomy site. The MPA catheter was exchanged
over an Amplatz wire for a peel-away sheath, through which the port
catheter, which had been trimmed to the appropriate length, was
advanced and positioned under fluoroscopy with its tip at the
cavoatrial junction. Spot chest radiograph confirms good catheter
position and no pneumothorax. The pocket was closed with deep
interrupted and subcuticular continuous 3-0 Monocryl sutures. The
port was flushed per protocol. The incisions were covered with
Dermabond then covered with a sterile dressing.

COMPLICATIONS:
COMPLICATIONS
None immediate

[Series 300: ir imaging guided port insertion · 1 of 1 slices shown]
[im 1/1]
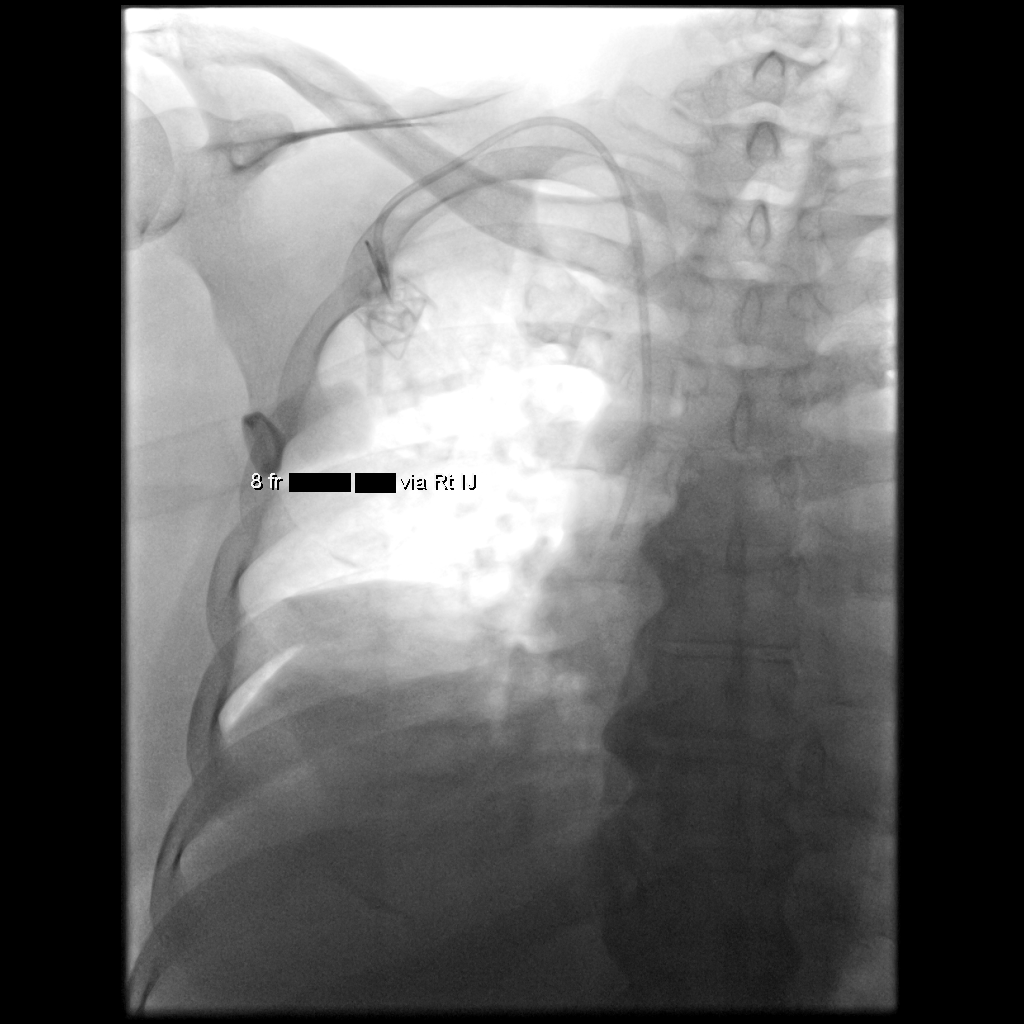

[1 of 1 positions shown; findings below may reference images not displayed]

EXAM:
TUNNELED PORT CATHETER PLACEMENT WITH ULTRASOUND AND FLUOROSCOPIC
GUIDANCE

FLUOROSCOPY TIME:  0.2 minute; 167  uTymR DAP

ANESTHESIA/SEDATION:
Intravenous Fentanyl and Versed were administered as conscious
sedation during continuous monitoring of the patient's level of
consciousness and physiological / cardiorespiratory status by the
radiology RN, with a total moderate sedation time of 17 minutes.
IMPRESSION: Technically successful right IJ power-injectable port catheter
placement. Ready for routine use.

## 2019-08-08 ENCOUNTER — Other Ambulatory Visit: Payer: Medicaid Other

## 2019-08-08 ENCOUNTER — Ambulatory Visit: Payer: Medicaid Other

## 2019-08-08 ENCOUNTER — Ambulatory Visit: Payer: Medicaid Other | Admitting: Hematology and Oncology

## 2019-08-09 ENCOUNTER — Telehealth: Payer: Self-pay | Admitting: *Deleted

## 2019-08-09 NOTE — Telephone Encounter (Signed)
Patient called - has sinus congestion/cough/short of breath. She stated cough/sob was due to her asthma. She has not been tested for covid since symptoms began. She coughed while on the phone and has audible wheeze after cough.  Has appts at [facility] on Monday 11/30 beginning at 9am for lab-flush/infusion. Recommended that with her symptoms she get tested for covid today and notify office of results before Monday if possible. She isn't sure she can get tested at all -- due to no transportation.  Information given to Dr.Shadad in Dr.Kale's absence - he advised testing and if not able to go, then direct her to go to ED is symptoms worsen between now and Monday.  Attempted to contact patient with this information, LVVM and encouraged her to contact office with results or updates.Marland Kitchen

## 2019-08-15 ENCOUNTER — Telehealth: Payer: Self-pay | Admitting: *Deleted

## 2019-08-15 ENCOUNTER — Inpatient Hospital Stay: Payer: Medicaid Other

## 2019-08-15 ENCOUNTER — Inpatient Hospital Stay: Payer: Medicaid Other | Admitting: Hematology

## 2019-08-15 NOTE — Telephone Encounter (Signed)
Contacted patient to f/u on respiratory symptoms reported last week as patient has appts today for labs/infusion/Dr.Kale. Patient reports continued cough/congestion/shortness of breath/wheezing. She reports using rescue inhaler as needed and taking medication for allergy. No fever.  Due to lack of transportation, patient has not been able to obtain testing for covid. Dr. Irene Limbo asked that patient be tested as soon as she can arrange it and to cancel today's appts. Per MD, will see at next appt if covid neg and symptoms reduced. Contacted patient and gave her Dr. Grier Mitts direction - she verbalized understanding and stated she will try to get test today or tomorrow and will call office with result.

## 2019-08-17 ENCOUNTER — Encounter: Payer: Self-pay | Admitting: Hematology

## 2019-08-17 ENCOUNTER — Ambulatory Visit: Payer: Medicaid Other | Admitting: Hematology

## 2019-08-17 ENCOUNTER — Other Ambulatory Visit: Payer: Medicaid Other

## 2019-08-17 ENCOUNTER — Ambulatory Visit: Payer: Medicaid Other

## 2019-08-18 ENCOUNTER — Telehealth: Payer: Self-pay | Admitting: *Deleted

## 2019-08-18 NOTE — Telephone Encounter (Signed)
Attempted to contact patient with Dr.Kale's recommendation: Please call with results of covid test as soon as you get them. Appointments for Monday 12/7 and 12/9 will be cancelled as she needs to to wait 21 days from onset of symptoms and be symptom free to resume care if test is negative. If test is positive, timeline will be adjusted. Symptoms reported on 11/24, so 21 days is 12/15. Advised patient that appointments for treatment and MD appt will be rescheduled after 12/15 - providing test is negative.   Left this information on named voice mail. Encouraged patient to contact office for questions.

## 2019-08-22 ENCOUNTER — Other Ambulatory Visit: Payer: Medicaid Other

## 2019-08-22 ENCOUNTER — Ambulatory Visit: Payer: Medicaid Other

## 2019-08-22 ENCOUNTER — Ambulatory Visit: Payer: Medicaid Other | Admitting: Hematology

## 2019-08-24 ENCOUNTER — Encounter: Payer: Self-pay | Admitting: Hematology

## 2019-08-29 ENCOUNTER — Telehealth: Payer: Self-pay | Admitting: *Deleted

## 2019-08-29 DIAGNOSIS — B372 Candidiasis of skin and nail: Secondary | ICD-10-CM | POA: Insufficient documentation

## 2019-08-29 DIAGNOSIS — Z6841 Body Mass Index (BMI) 40.0 and over, adult: Secondary | ICD-10-CM | POA: Insufficient documentation

## 2019-08-29 DIAGNOSIS — N959 Unspecified menopausal and perimenopausal disorder: Secondary | ICD-10-CM | POA: Insufficient documentation

## 2019-08-29 DIAGNOSIS — M62830 Muscle spasm of back: Secondary | ICD-10-CM | POA: Insufficient documentation

## 2019-08-29 DIAGNOSIS — B009 Herpesviral infection, unspecified: Secondary | ICD-10-CM | POA: Insufficient documentation

## 2019-08-29 DIAGNOSIS — N760 Acute vaginitis: Secondary | ICD-10-CM | POA: Insufficient documentation

## 2019-08-29 DIAGNOSIS — A5901 Trichomonal vulvovaginitis: Secondary | ICD-10-CM | POA: Insufficient documentation

## 2019-08-29 DIAGNOSIS — R319 Hematuria, unspecified: Secondary | ICD-10-CM | POA: Insufficient documentation

## 2019-08-29 DIAGNOSIS — N951 Menopausal and female climacteric states: Secondary | ICD-10-CM | POA: Insufficient documentation

## 2019-08-29 DIAGNOSIS — L900 Lichen sclerosus et atrophicus: Secondary | ICD-10-CM | POA: Insufficient documentation

## 2019-08-29 DIAGNOSIS — N909 Noninflammatory disorder of vulva and perineum, unspecified: Secondary | ICD-10-CM | POA: Insufficient documentation

## 2019-08-29 DIAGNOSIS — N898 Other specified noninflammatory disorders of vagina: Secondary | ICD-10-CM | POA: Insufficient documentation

## 2019-08-29 NOTE — Telephone Encounter (Signed)
Patient called - she is waiting on results from Covid test (12/8). Still has not received them. Per Dr. Irene Limbo, will need to have Neg result before she can come to [facility]. She stated she still had cough - was seeing asthma doctor tomorrow (virtual). She states she will call as soon as she knows something so appts can be rescheduled.

## 2019-08-30 ENCOUNTER — Ambulatory Visit (INDEPENDENT_AMBULATORY_CARE_PROVIDER_SITE_OTHER): Payer: Medicaid Other | Admitting: Allergy & Immunology

## 2019-08-30 ENCOUNTER — Other Ambulatory Visit: Payer: Self-pay

## 2019-08-30 ENCOUNTER — Encounter: Payer: Self-pay | Admitting: Allergy & Immunology

## 2019-08-30 DIAGNOSIS — L2089 Other atopic dermatitis: Secondary | ICD-10-CM

## 2019-08-30 DIAGNOSIS — J3089 Other allergic rhinitis: Secondary | ICD-10-CM | POA: Diagnosis not present

## 2019-08-30 DIAGNOSIS — J0100 Acute maxillary sinusitis, unspecified: Secondary | ICD-10-CM

## 2019-08-30 DIAGNOSIS — J4540 Moderate persistent asthma, uncomplicated: Secondary | ICD-10-CM

## 2019-08-30 DIAGNOSIS — J302 Other seasonal allergic rhinitis: Secondary | ICD-10-CM

## 2019-08-30 MED ORDER — PREDNISONE 10 MG PO TABS: ORAL_TABLET | ORAL | 0 refills | Status: DC

## 2019-08-30 MED ORDER — AMOXICILLIN-POT CLAVULANATE 875-125 MG PO TABS: 1.0000 | ORAL_TABLET | Freq: Two times a day (BID) | ORAL | 0 refills | Status: AC

## 2019-08-30 MED ORDER — BUDESONIDE-FORMOTEROL FUMARATE 160-4.5 MCG/ACT IN AERO: 2.0000 | INHALATION_SPRAY | Freq: Two times a day (BID) | RESPIRATORY_TRACT | 5 refills | Status: DC

## 2019-08-30 MED ORDER — ALBUTEROL SULFATE HFA 108 (90 BASE) MCG/ACT IN AERS: 2.0000 | INHALATION_SPRAY | RESPIRATORY_TRACT | 2 refills | Status: DC | PRN

## 2019-08-30 NOTE — Progress Notes (Signed)
RE: Margaret [name] MRN: [id number] DOB: 02-05-1971 Date of Telemedicine Visit: 08/30/2019  Referring provider: Nolene Ebbs, MD Primary care provider: Nolene Ebbs, MD  Chief Complaint: No chief complaint on file.   Telemedicine Follow Up Visit via Telephone: I connected with Margaret [name] for a follow up on 08/30/19 by telephone and verified that I am speaking with the correct person using two identifiers.   I discussed the limitations, risks, security and privacy concerns of performing an evaluation and management service by telephone and the availability of in person appointments. I also discussed with the patient that there may be a patient responsible charge related to this service. The patient expressed understanding and agreed to proceed.  Patient is at home.  Provider is at the office.  Visit start time: 11:17 AM Visit end time: 11:31 AM Insurance consent/check in by: [facility] Medical consent and medical assistant/nurse: Olivia Mackie  History of Present Illness:  She is a 48 y.o. female, who is being followed for persistent asthma as well as allergic rhinitis and eczema. Her previous allergy office visit was in June 2020 with myself.  She is awaiting a chemo treatment. She got it last in mid November. She has been off of schedule because they are awaiting a COVID testing. She got tested around one week ago. She reports postnasal drip without a sore throat. But she has had a cough. It started around Thanksgiving and has not cleared up. She does not have a rescue inhaler that was up to date.  Asthma/Respiratory Symptom History: Prior to her current onset of symptoms, she was having marked shortness of breath. She has been using her Symbicort two puffs twice daily. She has not been on systemic steroids in quite some time.  Margaret [name]'s asthma has been well controlled. She has not required rescue medication, experienced nocturnal awakenings due to lower respiratory symptoms, nor have  activities of daily living been limited. She has required no Emergency Department or Urgent Care visits for her asthma. She has required zero courses of systemic steroids for asthma exacerbations since the last visit. ACT score today is 13, indicating terrible asthma symptom control. She has not been to the ED and has not been hospitalized.   Allergic Rhinitis Symptom History: She remains on the nasal saline rinses. This is enough to keep everything clear. She is also on the cetirizine daily. She has not needed antibiotics at all since the last visit. She has not needed antibiotics in quite some time, in fact she cannot remember the last time that she needed antibiotics.   Eczema Symptom Symptom History: She does report some dryness but otherwise it is good. She has had any honey crusting at all consistent. She has not needed systemic steroids for her skin at all.   Otherwise, there have been no changes to her past medical history, surgical history, family history, or social history.  Assessment and Plan:  Margaret [name] is a 48 y.o. female with:  Moderate persistent asthma, uncomplicated  Seasonal and perennial allergic rhinitis  Eczema  Stage IV colon cancer with metastases   1. Moderate persistent asthma, uncomplicated - We will send in refills for all of her medications. - Daily controller medication(s): Singulair 10mg  daily and Symbicort 80/4.70mcg two puffs twice daily with spacer - Prior to physical activity: ProAir 2 puffs 10-15 minutes before physical activity. - Rescue medications: ProAir 4 puffs every 4-6 hours as needed - Asthma control goals:  * Full participation in all desired activities (may need albuterol before  activity) * Albuterol use two time or less a week on average (not counting use with activity) * Cough interfering with sleep two time or less a month * Oral steroids no more than once a year * No hospitalizations  2. Seasonal and perennial allergic rhinitis - with  overlying acute sinusitis - Continue with Nasacort 1-2 sprays per nostril daily. - Continue with Zyrtec 10mg  daily.  - Add on Augmentin one tablet twice daily. - Start a low dose prednisone taper.   3. Eczema - Continue with Elidel as needed.  - Continue with moisturizing twice daily.  - Continue with nystatin twice daily as needed for fungal flares.   4. Return in about 6 months (around 02/28/2020). This can be an in-person, a virtual Webex or a telephone follow up visit.   Diagnostics: None.  Medication List:  Current Outpatient Medications  Medication Sig Dispense Refill  . albuterol (PROAIR HFA) 108 (90 Base) MCG/ACT inhaler Inhale 2 puffs into the lungs every 4 (four) hours as needed for wheezing or shortness of breath. 6.7 g 2  . amoxicillin-clavulanate (AUGMENTIN) 875-125 MG tablet Take 1 tablet by mouth 2 (two) times daily for 10 days. 20 tablet 0  . Boric Acid 4 % SOLN Place 1 capsule vaginally every Friday.     . budesonide-formoterol (SYMBICORT) 160-4.5 MCG/ACT inhaler Inhale 2 puffs into the lungs 2 (two) times daily. 6 g 5  . cetirizine (ZYRTEC) 10 MG tablet Take 1 tablet (10 mg total) by mouth daily. 30 tablet 5  . clobetasol ointment (TEMOVATE) AB-123456789 % Apply 1 application topically 2 (two) times daily. Use for Lichen Sclerosis    . Cyanocobalamin (B-12) 1000 MCG SUBL Place 1,000 mcg under the tongue daily. 30 each 3  . dexamethasone (DECADRON) 4 MG tablet Take 2 tablets (8 mg total) by mouth daily. Start the day after chemo for 2 days. 8 tablet 5  . diazepam (VALIUM) 5 MG tablet Take 1 tablet (5 mg total) by mouth daily as needed for anxiety (panic attacks). 30 tablet 0  . diclofenac sodium (VOLTAREN) 1 % GEL Apply 2 g topically 4 (four) times daily as needed (for back/knee pain.).     Marland Kitchen dicyclomine (BENTYL) 20 MG tablet Take 1 tablet (20 mg total) by mouth 2 (two) times daily. (Patient taking differently: Take 20 mg by mouth 3 (three) times daily before meals. ) 20 tablet  0  . dronabinol (MARINOL) 5 MG capsule Take 1 capsule (5 mg total) by mouth 2 (two) times daily before a meal. 60 capsule 0  . ergocalciferol (VITAMIN D2) 50000 UNITS capsule Take 50,000 Units by mouth every Monday.     Marland Kitchen lisinopril-hydrochlorothiazide (PRINZIDE,ZESTORETIC) 20-12.5 MG tablet Take 1 tablet by mouth daily.    Marland Kitchen loperamide (IMODIUM A-D) 2 MG tablet Take 1 tablet (2 mg total) by mouth 4 (four) times daily as needed. Take 2 at diarrhea onset , then 1 every 2hr until 12hrs with no BM. May take 2 every 4hrs at night. If diarrhea recurs repeat. 60 tablet 1  . LORazepam (ATIVAN) 1 MG tablet Take 1 tablet (1 mg total) by mouth every 6 (six) hours as needed (NAUSEA). 30 tablet 0  . montelukast (SINGULAIR) 10 MG tablet Take 1 tablet (10 mg total) by mouth at bedtime. 30 tablet 6  . nystatin-triamcinolone (MYCOLOG II) cream Apply 1 application topically 2 (two) times daily as needed (irritation). Use in skin folds and under breast     . Olopatadine HCl (PAZEO) 0.7 %  SOLN Apply 1 drop to eye daily. 2.5 mL 5  . ondansetron (ZOFRAN) 8 MG tablet Take 1 tablet (8 mg total) by mouth every 8 (eight) hours as needed for nausea or vomiting. 20 tablet 0  . oxyCODONE-acetaminophen (PERCOCET/ROXICET) 5-325 MG tablet Take 1-2 tablets by mouth every 4 (four) hours as needed for moderate pain or severe pain. 60 tablet 0  . pantoprazole (PROTONIX) 40 MG tablet Take 1 tablet (40 mg total) by mouth daily before breakfast. 30 tablet 1  . pimecrolimus (ELIDEL) 1 % cream Apply 1 application topically 2 (two) times daily. 30 g 6  . predniSONE (DELTASONE) 10 MG tablet Take two tablets (20mg ) twice daily for three days, then one tablet (10mg ) twice daily for three days, then STOP. 18 tablet 0  . prochlorperazine (COMPAZINE) 10 MG tablet Take 1 tablet (10 mg total) by mouth every 6 (six) hours as needed (NAUSEA). 30 tablet 1  . promethazine (PHENERGAN) 25 MG tablet Take 1 tablet (25 mg total) by mouth every 6 (six) hours  as needed for nausea. (Patient not taking: Reported on 07/10/2019) 30 tablet 0  . saccharomyces boulardii (FLORASTOR) 250 MG capsule Take 1 capsule (250 mg total) by mouth 2 (two) times daily.    . traMADol (ULTRAM) 50 MG tablet Take 1-2 tablets (50-100 mg total) by mouth every 6 (six) hours as needed for moderate pain or severe pain. (Patient not taking: Reported on 07/10/2019) 90 tablet 0  . triamcinolone (NASACORT) 55 MCG/ACT AERO nasal inhaler Place 2 sprays into the nose daily as needed (for allergies). 1 Inhaler 5  . valACYclovir (VALTREX) 500 MG tablet Take 500 mg by mouth 2 (two) times daily as needed (for cold sores).      No current facility-administered medications for this visit.   Allergies: Allergies  Allergen Reactions  . Latex Hives and Shortness Of Breath  . Tape Rash  . Tylenol With Codeine #3 [Acetaminophen-Codeine] Itching and Rash   I reviewed her past medical history, social history, family history, and environmental history and no significant changes have been reported from previous visits.  Review of Systems  Constitutional: Negative for activity change and appetite change.  HENT: Negative for congestion, postnasal drip, rhinorrhea, sinus pressure and sore throat.   Eyes: Negative for pain, discharge, redness and itching.  Respiratory: Negative for shortness of breath, wheezing and stridor.   Gastrointestinal: Negative for diarrhea, nausea and vomiting.  Musculoskeletal: Negative for arthralgias, joint swelling and myalgias.  Skin: Negative for rash.  Allergic/Immunologic: Negative for environmental allergies and food allergies.    Objective:  Physical exam not obtained as encounter was done via telephone.   Previous notes and tests were reviewed.  I discussed the assessment and treatment plan with the patient. The patient was provided an opportunity to ask questions and all were answered. The patient agreed with the plan and demonstrated an understanding of  the instructions.   The patient was advised to call back or seek an in-person evaluation if the symptoms worsen or if the condition fails to improve as anticipated.  I provided 14 minutes of non-face-to-face time during this encounter.  It was my pleasure to participate in [place] care today. Please feel free to contact me with any questions or concerns.   Sincerely,  Valentina Shaggy, MD

## 2019-08-30 NOTE — Patient Instructions (Addendum)
1. Moderate persistent asthma, uncomplicated - We will send in refills for all of her medications. - Daily controller medication(s): Singulair 10mg  daily and Symbicort 80/4.52mcg two puffs twice daily with spacer - Prior to physical activity: ProAir 2 puffs 10-15 minutes before physical activity. - Rescue medications: ProAir 4 puffs every 4-6 hours as needed - Asthma control goals:  * Full participation in all desired activities (may need albuterol before activity) * Albuterol use two time or less a week on average (not counting use with activity) * Cough interfering with sleep two time or less a month * Oral steroids no more than once a year * No hospitalizations  2. Seasonal and perennial allergic rhinitis - with overlying acute sinusitis - Continue with Nasacort 1-2 sprays per nostril daily. - Continue with Zyrtec 10mg  daily.  - Add on Augmentin one tablet twice daily. - Start a low dose prednisone taper.   3. Eczema - Continue with Elidel as needed.  - Continue with moisturizing twice daily.  - Continue with nystatin twice daily as needed for fungal flares.   4. Return in about 6 months (around 02/28/2020). This can be an in-person, a virtual Webex or a telephone follow up visit.   Please inform us of any Emergency Department visits, hospitalizations, or changes in symptoms. Call us before going to the ED for breathing or allergy symptoms since we might be able to fit you in for a sick visit. Feel free to contact us anytime with any questions, problems, or concerns.  It was a pleasure to talk to you today today!  Websites that have reliable patient information: 1. American Academy of Asthma, Allergy, and Immunology: www.aaaai.org 2. Food Allergy Research and Education (FARE): foodallergy.org 3. Mothers of Asthmatics: http://www.asthmacommunitynetwork.org 4. American College of Allergy, Asthma, and Immunology: www.acaai.org  "Like" [place] on Facebook and Instagram for our latest  updates!      Make sure you are registered to vote! If you have moved or changed any of your contact information, you will need to get this updated before voting!  In some cases, you MAY be able to register to vote online: [email or web address]

## 2019-09-01 ENCOUNTER — Encounter: Payer: Self-pay | Admitting: Hematology

## 2019-09-04 ENCOUNTER — Encounter: Payer: Self-pay | Admitting: Allergy & Immunology

## 2019-09-05 ENCOUNTER — Encounter: Payer: Self-pay | Admitting: Allergy & Immunology

## 2019-09-06 ENCOUNTER — Other Ambulatory Visit: Payer: Self-pay | Admitting: *Deleted

## 2019-09-06 ENCOUNTER — Telehealth: Payer: Self-pay | Admitting: *Deleted

## 2019-09-06 MED ORDER — MAGIC MOUTHWASH: 5.0000 mL | Freq: Four times a day (QID) | ORAL | 0 refills | Status: DC | PRN

## 2019-09-06 MED ORDER — CLARITHROMYCIN 500 MG PO TABS: 500.0000 mg | ORAL_TABLET | Freq: Two times a day (BID) | ORAL | 0 refills | Status: AC

## 2019-09-06 MED ORDER — BENZONATATE 200 MG PO CAPS: 200.0000 mg | ORAL_CAPSULE | Freq: Three times a day (TID) | ORAL | 0 refills | Status: DC | PRN

## 2019-09-06 NOTE — Telephone Encounter (Signed)
Contacted patient with Dr. Grier Mitts response after receiving following MyChart message: "I'm still coughing very bad. So, Dr. Ernst Bowler has to call me in some more prescriptions ( Tessalon pearls, Biaxin 500mg , and magic mouthwash) today. He had originally prescribed me prednisone and an antibiotic that has not been to helpful in resolving my symptoms. Please let me know our next course of action as far as chemo treatments."   Per Dr. Irene Limbo: Appointments 1st week of Jan for labs, portflush, MD visit and next cycle of chemo. Patient is to follow up with PCP to ensure resolution of respiratory infection.  Schedule message sent as MD requested above. Informed patient that scheduling will contact her with appt times. Patient verbalized understanding of Dr. Grier Mitts response.

## 2019-09-07 ENCOUNTER — Telehealth: Payer: Self-pay | Admitting: Hematology

## 2019-09-07 NOTE — Telephone Encounter (Signed)
Scheduled appt per 12/22 sch message - pt aware of appt date and time

## 2019-09-10 ENCOUNTER — Encounter: Payer: Self-pay | Admitting: Hematology

## 2019-09-12 ENCOUNTER — Emergency Department (HOSPITAL_COMMUNITY)
Admission: EM | Admit: 2019-09-12 | Discharge: 2019-09-12 | Disposition: A | Discharge status: Home / Self Care | Payer: Medicaid Other | Attending: Emergency Medicine | Admitting: Emergency Medicine

## 2019-09-12 ENCOUNTER — Encounter (HOSPITAL_COMMUNITY): Payer: Self-pay

## 2019-09-12 ENCOUNTER — Other Ambulatory Visit: Payer: Self-pay

## 2019-09-12 ENCOUNTER — Telehealth: Payer: Self-pay | Admitting: *Deleted

## 2019-09-12 ENCOUNTER — Encounter: Payer: Self-pay | Admitting: Allergy & Immunology

## 2019-09-12 ENCOUNTER — Emergency Department (HOSPITAL_COMMUNITY): Payer: Medicaid Other

## 2019-09-12 DIAGNOSIS — I10 Essential (primary) hypertension: Secondary | ICD-10-CM | POA: Insufficient documentation

## 2019-09-12 DIAGNOSIS — R0602 Shortness of breath: Secondary | ICD-10-CM | POA: Diagnosis present

## 2019-09-12 DIAGNOSIS — Z79899 Other long term (current) drug therapy: Secondary | ICD-10-CM | POA: Diagnosis not present

## 2019-09-12 DIAGNOSIS — Z87891 Personal history of nicotine dependence: Secondary | ICD-10-CM | POA: Diagnosis not present

## 2019-09-12 DIAGNOSIS — Z91040 Latex allergy status: Secondary | ICD-10-CM | POA: Insufficient documentation

## 2019-09-12 DIAGNOSIS — Z85038 Personal history of other malignant neoplasm of large intestine: Secondary | ICD-10-CM | POA: Insufficient documentation

## 2019-09-12 DIAGNOSIS — Z8505 Personal history of malignant neoplasm of liver: Secondary | ICD-10-CM | POA: Insufficient documentation

## 2019-09-12 DIAGNOSIS — U071 COVID-19: Secondary | ICD-10-CM | POA: Insufficient documentation

## 2019-09-12 LAB — CBC
HCT: 37.8 % (ref 36.0–46.0)
Hemoglobin: 11 g/dL — ABNORMAL LOW (ref 12.0–15.0)
MCH: 27 pg (ref 26.0–34.0)
MCHC: 29.1 g/dL — ABNORMAL LOW (ref 30.0–36.0)
MCV: 92.9 fL (ref 80.0–100.0)
Platelets: 196 10*3/uL (ref 150–400)
RBC: 4.07 MIL/uL (ref 3.87–5.11)
RDW: 16.5 % — ABNORMAL HIGH (ref 11.5–15.5)
WBC: 3.3 10*3/uL — ABNORMAL LOW (ref 4.0–10.5)
nRBC: 0 % (ref 0.0–0.2)

## 2019-09-12 LAB — RESPIRATORY PANEL BY RT PCR (FLU A&B, COVID)
Influenza A by PCR: NEGATIVE
Influenza B by PCR: NEGATIVE
SARS Coronavirus 2 by RT PCR: POSITIVE — AB

## 2019-09-12 LAB — COMPREHENSIVE METABOLIC PANEL
ALT: 29 U/L (ref 0–44)
AST: 53 U/L — ABNORMAL HIGH (ref 15–41)
Albumin: 2.8 g/dL — ABNORMAL LOW (ref 3.5–5.0)
Alkaline Phosphatase: 88 U/L (ref 38–126)
Anion gap: 10 (ref 5–15)
BUN: 16 mg/dL (ref 6–20)
CO2: 23 mmol/L (ref 22–32)
Calcium: 8.5 mg/dL — ABNORMAL LOW (ref 8.9–10.3)
Chloride: 108 mmol/L (ref 98–111)
Creatinine, Ser: 0.98 mg/dL (ref 0.44–1.00)
GFR calc Af Amer: >60 mL/min (ref >60)
GFR calc non Af Amer: >60 mL/min (ref >60)
Glucose, Bld: 97 mg/dL (ref 70–99)
Potassium: 3.8 mmol/L (ref 3.5–5.1)
Sodium: 141 mmol/L (ref 135–145)
Total Bilirubin: 0.4 mg/dL (ref 0.3–1.2)
Total Protein: 7.4 g/dL (ref 6.5–8.1)

## 2019-09-12 LAB — D-DIMER, QUANTITATIVE: D-Dimer, Quant: 1.83 ug/mL-FEU — ABNORMAL HIGH (ref 0.00–0.50)

## 2019-09-12 MED ORDER — SODIUM CHLORIDE (PF) 0.9 % IJ SOLN
INTRAMUSCULAR | Status: AC
  Filled 2019-09-12: qty 50

## 2019-09-12 MED ORDER — ALBUTEROL SULFATE HFA 108 (90 BASE) MCG/ACT IN AERS
4.0000 | INHALATION_SPRAY | Freq: Once | RESPIRATORY_TRACT | Status: AC
  Administered 2019-09-12: 4 via RESPIRATORY_TRACT
  Filled 2019-09-12: qty 6.7

## 2019-09-12 MED ORDER — IPRATROPIUM BROMIDE HFA 17 MCG/ACT IN AERS
2.0000 | INHALATION_SPRAY | Freq: Once | RESPIRATORY_TRACT | Status: AC
  Administered 2019-09-12: 17:00:00 2 via RESPIRATORY_TRACT
  Filled 2019-09-12: qty 12.9

## 2019-09-12 MED ORDER — SODIUM CHLORIDE 0.9 % IV SOLN
INTRAVENOUS | Status: DC
  Administered 2019-09-12: 14:00:00 20 mL/h via INTRAVENOUS

## 2019-09-12 MED ORDER — HEPARIN SOD (PORK) LOCK FLUSH 100 UNIT/ML IV SOLN
500.0000 [IU] | Freq: Once | INTRAVENOUS | Status: AC
  Administered 2019-09-12: 500 [IU]
  Filled 2019-09-12: qty 5

## 2019-09-12 MED ORDER — DEXAMETHASONE SODIUM PHOSPHATE 10 MG/ML IJ SOLN
10.0000 mg | Freq: Once | INTRAMUSCULAR | Status: AC
  Administered 2019-09-12: 10 mg via INTRAVENOUS
  Filled 2019-09-12: qty 1

## 2019-09-12 MED ORDER — IOHEXOL 350 MG/ML SOLN
100.0000 mL | Freq: Once | INTRAVENOUS | Status: AC | PRN
  Administered 2019-09-12: 100 mL via INTRAVENOUS

## 2019-09-12 NOTE — ED Triage Notes (Signed)
Per EMS- patient has been c/o SOB x 1 week. Patient denies fever and states she had a negative Covid test 1 week prior to Christmas. Patient has a history of colon and liver cancer.

## 2019-09-12 NOTE — ED Notes (Signed)
Date and time results received: 09/12/19 3:32 PM   Test:COVID Critical Value: positive  Name of Provider Notified: Dr Ashok Cordia  Orders Received? Or Actions Taken?: Airborne precautions already in effect

## 2019-09-12 NOTE — Telephone Encounter (Signed)
Contacted patient by phone following Mychart messages to let her know messages received: 1) request for call back; 2) she was going to [facility] ED for evaluation for continued shortness of breath and decreased appetite as recommended by her asthma doctor   Also informed patient that Dr. Irene Limbo was out of office this week and if she should have oncological needs, the on-call oncology MD would be responding. Patient verbalized understanding.

## 2019-09-12 NOTE — Discharge Instructions (Addendum)
It was our pleasure to provide your ER care today - we hope that you feel better.  See covid instructions/precautions. Drink adequate fluids, eat balanced diet. Stay active, take full and deep breaths, considering spending time in a prone position (face down/forward) if able.   The outpatient infusion center may be contacting you should you qualify for any additional outpatient therapies.   Return to ER if worse, increased trouble breathing, weak/fainting, or other concern.

## 2019-09-12 NOTE — ED Provider Notes (Signed)
[facility] DEPT Provider Note   CSN: [id number] Arrival date & time: 09/12/19  1238     History Chief Complaint  Patient presents with  . Shortness of Breath    Margaret [name] is a 48 y.o. female.  Patient with hx asthma, hx metastatic colon ca, presents with sob x 1 week. Symptoms acute onset, moderate, persistent/constant, worse w exertion. States last week had non prod cough, no sore throat or runny nose. No fevers. No known covid + exposure. Patient denies chest pain or discomfort. No leg pain or swelling. No hx dvt or pe. No recent cancer tx, states is supposed to re-start chemo tx next month. No recent bleeding or blood loss, no melena or hematochezia. No recent change in meds or new meds. Denies recent asthma exacerbating or increase in mdi use.   The history is provided by the patient and the EMS personnel.  Shortness of Breath Associated symptoms: cough   Associated symptoms: no abdominal pain, no chest pain, no fever, no headaches, no neck pain, no rash, no sore throat and no vomiting        Past Medical History:  Diagnosis Date  . Allergic rhinitis   . Anxiety diagnosed in 1990  . Arthritis   . Asthma   . colon ca with liver mets dx'd 08/2018  . Dyspnea    with low blood count hx of anemia  . Eczema   . History of blood transfusion   . Hypertension   . Lower back pain   . Migraine   . Sleep apnea    uses CPAP    Patient Active Problem List   Diagnosis Date Noted  . Morbid obesity with BMI of 40.0-44.9, adult ([place]) 08/29/2019  . Vaginal trichomoniasis 08/29/2019  . Blood in urine 08/29/2019  . Candidiasis of skin 08/29/2019  . Disorder of vulva 08/29/2019  . Herpes simplex 08/29/2019  . Lichen sclerosus et atrophicus 08/29/2019  . Menopausal problem 08/29/2019  . Menopausal syndrome 08/29/2019  . Spasm of back muscles 08/29/2019  . Vaginitis 08/29/2019  . Vaginal irritation 08/29/2019  . Port-A-Cath in place  11/03/2018  . Counseling regarding advance care planning and goals of care 09/06/2018  . Colon cancer metastasized to liver ([place])   . Hypokalemia 09/01/2018  . Aspiration pneumonia ([place]) 08/30/2018  . Subcapsular hematoma of liver 08/27/2018  . Obesity, Class III, BMI 40-49.9 (morbid obesity) ([name]) 08/27/2018  . Metastatic carcinoma to liver ([name]) 08/27/2018  . B12 deficiency anemia 08/27/2018  . Dizziness   . Acute blood loss anemia   . Thyroid mass 08/10/2018  . Epistaxis 08/10/2018  . Iron deficiency anemia 04/28/2018  . Symptomatic anemia 04/09/2018  . Chest pain 04/09/2018  . Anxiety   . Asthma   . Hypertension   . Migraine     Past Surgical History:  Procedure Laterality Date  . ABDOMINAL HYSTERECTOMY    . cortisone injections     knees bilat and back   . IR IMAGING GUIDED PORT INSERTION  09/14/2018  . iron infusion    . LIVER BIOPSY       OB History   No obstetric history on file.     Family History  Problem Relation Age of Onset  . Diabetes Father     Social History   Tobacco Use  . Smoking status: Former Smoker    Quit date: 07/30/1999    Years since quitting: 20.1  . Smokeless tobacco: Never Used  Substance Use  Topics  . Alcohol use: No  . Drug use: No    Home Medications Prior to Admission medications   Medication Sig Start Date End Date Taking? Authorizing Provider  albuterol (PROAIR HFA) 108 (90 Base) MCG/ACT inhaler Inhale 2 puffs into the lungs every 4 (four) hours as needed for wheezing or shortness of breath. 08/30/19   Valentina Shaggy, MD  benzonatate (TESSALON) 200 MG capsule Take 1 capsule (200 mg total) by mouth 3 (three) times daily as needed for cough. 09/06/19   Valentina Shaggy, MD  Boric Acid 4 % SOLN Place 1 capsule vaginally every Friday.     [provider]  budesonide-formoterol (SYMBICORT) 160-4.5 MCG/ACT inhaler Inhale 2 puffs into the lungs 2 (two) times daily. 08/30/19 09/29/19  Valentina Shaggy, MD    cetirizine (ZYRTEC) 10 MG tablet Take 1 tablet (10 mg total) by mouth daily. 03/10/19   Valentina Shaggy, MD  clarithromycin (BIAXIN) 500 MG tablet Take 1 tablet (500 mg total) by mouth 2 (two) times daily for 7 days. 09/06/19 09/13/19  Valentina Shaggy, MD  clobetasol ointment (TEMOVATE) [id number] % Apply 1 application topically 2 (two) times daily. Use for Lichen Sclerosis    Bovard-Stuckert, Jody, MD  Cyanocobalamin (B-12) 1000 MCG SUBL Place 1,000 mcg under the tongue daily. 04/28/18   Brunetta Genera, MD  dexamethasone (DECADRON) 4 MG tablet Take 2 tablets (8 mg total) by mouth daily. Start the day after chemo for 2 days. 07/04/19   Brunetta Genera, MD  diazepam (VALIUM) 5 MG tablet Take 1 tablet (5 mg total) by mouth daily as needed for anxiety (panic attacks). 11/19/18   Brunetta Genera, MD  diclofenac sodium (VOLTAREN) 1 % GEL Apply 2 g topically 4 (four) times daily as needed (for back/knee pain.).     [provider]  dicyclomine (BENTYL) 20 MG tablet Take 1 tablet (20 mg total) by mouth 2 (two) times daily. Patient taking differently: Take 20 mg by mouth 3 (three) times daily before meals.  04/16/18   Doristine Devoid, PA-C  dronabinol (MARINOL) 5 MG capsule Take 1 capsule (5 mg total) by mouth 2 (two) times daily before a meal. 01/17/19   Brunetta Genera, MD  ergocalciferol (VITAMIN D2) 50000 UNITS capsule Take 50,000 Units by mouth every Monday.     [provider]  lisinopril-hydrochlorothiazide (PRINZIDE,ZESTORETIC) 20-12.5 MG tablet Take 1 tablet by mouth daily.    [provider]  loperamide (IMODIUM A-D) 2 MG tablet Take 1 tablet (2 mg total) by mouth 4 (four) times daily as needed. Take 2 at diarrhea onset , then 1 every 2hr until 12hrs with no BM. May take 2 every 4hrs at night. If diarrhea recurs repeat. 07/04/19   Brunetta Genera, MD  LORazepam (ATIVAN) 1 MG tablet Take 1 tablet (1 mg total) by mouth every 6 (six) hours as  needed (NAUSEA). 07/04/19   Brunetta Genera, MD  magic mouthwash SOLN Take 5 mLs by mouth 4 (four) times daily as needed for mouth pain. Swish and spit. 09/06/19   Valentina Shaggy, MD  montelukast (SINGULAIR) 10 MG tablet Take 1 tablet (10 mg total) by mouth at bedtime. 03/10/19   Valentina Shaggy, MD  nystatin-triamcinolone [facility]) cream Apply 1 application topically 2 (two) times daily as needed (irritation). Use in skin folds and under breast     Bovard-Stuckert, Jody, MD  Olopatadine HCl (PAZEO) 0.7 % SOLN Apply 1 drop to eye  daily. 03/11/19   Valentina Shaggy, MD  ondansetron (ZOFRAN) 8 MG tablet Take 1 tablet (8 mg total) by mouth every 8 (eight) hours as needed for nausea or vomiting. 07/10/19   Daleen Bo, MD  oxyCODONE-acetaminophen (PERCOCET/ROXICET) 5-325 MG tablet Take 1-2 tablets by mouth every 4 (four) hours as needed for moderate pain or severe pain. 07/11/19   Brunetta Genera, MD  pantoprazole (PROTONIX) 40 MG tablet Take 1 tablet (40 mg total) by mouth daily before breakfast. 03/10/19   Valentina Shaggy, MD  pimecrolimus (ELIDEL) 1 % cream Apply 1 application topically 2 (two) times daily. 07/06/18   Valentina Shaggy, MD  predniSONE (DELTASONE) 10 MG tablet Take two tablets (20mg ) twice daily for three days, then one tablet (10mg ) twice daily for three days, then STOP. 08/30/19   Valentina Shaggy, MD  prochlorperazine (COMPAZINE) 10 MG tablet Take 1 tablet (10 mg total) by mouth every 6 (six) hours as needed (NAUSEA). 07/04/19   Brunetta Genera, MD  promethazine (PHENERGAN) 25 MG tablet Take 1 tablet (25 mg total) by mouth every 6 (six) hours as needed for nausea. Patient not taking: Reported on 07/10/2019 06/08/19   Brunetta Genera, MD  saccharomyces boulardii (FLORASTOR) 250 MG capsule Take 1 capsule (250 mg total) by mouth 2 (two) times daily. 09/03/18   Georgette Shell, MD  traMADol (ULTRAM) 50 MG tablet Take 1-2 tablets  (50-100 mg total) by mouth every 6 (six) hours as needed for moderate pain or severe pain. Patient not taking: Reported on 07/10/2019 02/22/19   Brunetta Genera, MD  triamcinolone (NASACORT) 55 MCG/ACT AERO nasal inhaler Place 2 sprays into the nose daily as needed (for allergies). 03/10/19   Valentina Shaggy, MD  valACYclovir (VALTREX) 500 MG tablet Take 500 mg by mouth 2 (two) times daily as needed (for cold sores).     [provider]    Allergies    Latex, Tape, and Tylenol with codeine #3 [acetaminophen-codeine]  Review of Systems   Review of Systems  Constitutional: Negative for chills and fever.  HENT: Negative for sore throat.   Eyes: Negative for redness.  Respiratory: Positive for cough and shortness of breath.   Cardiovascular: Negative for chest pain, palpitations and leg swelling.  Gastrointestinal: Negative for abdominal pain and vomiting.  Endocrine: Negative for polyuria.  Genitourinary: Negative for dysuria and flank pain.  Musculoskeletal: Negative for back pain and neck pain.  Skin: Negative for rash.  Neurological: Negative for headaches.  Hematological: Does not bruise/bleed easily.  Psychiatric/Behavioral: Negative for confusion.    Physical Exam Updated Vital Signs Ht 1.727 m (5\' 8" )   Wt (!) 188.7 kg   SpO2 94% Comment: EMS placed O2 4L/min via [place] for comfort and sats increased to 100%  BMI 63.25 kg/m   Physical Exam Vitals and nursing note reviewed.  Constitutional:      Appearance: Normal appearance. She is well-developed.  HENT:     Head: Atraumatic.     Nose: Nose normal.     Mouth/Throat:     Mouth: Mucous membranes are moist.  Eyes:     General: No scleral icterus.    Conjunctiva/sclera: Conjunctivae normal.  Neck:     Trachea: No tracheal deviation.  Cardiovascular:     Rate and Rhythm: Normal rate and regular rhythm.     Pulses: Normal pulses.     Heart sounds: Normal heart sounds. No murmur. No friction rub. No  gallop.  Pulmonary:     Effort: Pulmonary effort is normal. No respiratory distress.     Breath sounds: Normal breath sounds.  Abdominal:     General: Bowel sounds are normal. There is no distension.     Palpations: Abdomen is soft.     Tenderness: There is no abdominal tenderness. There is no guarding.     Comments: Obese.   Genitourinary:    Comments: No cva tenderness.  Musculoskeletal:        General: No swelling or tenderness.     Cervical back: Normal range of motion and neck supple. No rigidity. No muscular tenderness.     Right lower leg: No edema.     Left lower leg: No edema.  Skin:    General: Skin is warm and dry.     Findings: No rash.  Neurological:     Mental Status: She is alert.     Comments: Alert, speech normal.   Psychiatric:        Mood and Affect: Mood normal.     ED Results / Procedures / Treatments   Labs (all labs ordered are listed, but only abnormal results are displayed) Results for orders placed or performed during the hospital encounter of 09/12/19  Respiratory Panel by RT PCR (Flu A&B, Covid) - Nasopharyngeal Swab   Specimen: Nasopharyngeal Swab  Result Value Ref Range   SARS Coronavirus 2 by RT PCR POSITIVE (A) NEGATIVE   Influenza A by PCR NEGATIVE NEGATIVE   Influenza B by PCR NEGATIVE NEGATIVE  CBC  Result Value Ref Range   WBC 3.3 (L) 4.0 - 10.5 K/uL   RBC 4.07 3.87 - 5.11 MIL/uL   Hemoglobin 11.0 (L) 12.0 - 15.0 g/dL   HCT 37.8 36.0 - 46.0 %   MCV 92.9 80.0 - 100.0 fL   MCH 27.0 26.0 - 34.0 pg   MCHC 29.1 (L) 30.0 - 36.0 g/dL   RDW 16.5 (H) 11.5 - 15.5 %   Platelets 196 150 - 400 K/uL   nRBC 0.0 0.0 - 0.2 %  CMET  Result Value Ref Range   Sodium 141 135 - 145 mmol/L   Potassium 3.8 3.5 - 5.1 mmol/L   Chloride 108 98 - 111 mmol/L   CO2 23 22 - 32 mmol/L   Glucose, Bld 97 70 - 99 mg/dL   BUN 16 6 - 20 mg/dL   Creatinine, Ser 0.98 0.44 - 1.00 mg/dL   Calcium 8.5 (L) 8.9 - 10.3 mg/dL   Total Protein 7.4 6.5 - 8.1 g/dL    Albumin 2.8 (L) 3.5 - 5.0 g/dL   AST 53 (H) 15 - 41 U/L   ALT 29 0 - 44 U/L   Alkaline Phosphatase 88 38 - 126 U/L   Total Bilirubin 0.4 0.3 - 1.2 mg/dL   GFR calc non Af Amer >60 >60 mL/min   GFR calc Af Amer >60 >60 mL/min   Anion gap 10 5 - 15  D-dimer  Result Value Ref Range   D-Dimer, Quant 1.83 (H) 0.00 - 0.50 ug/mL-FEU   CT Angio Chest PE W/Cm &/Or Wo Cm  Result Date: 09/12/2019 CLINICAL DATA:  Shortness of breath for 1 week. COVID-19 positive. History of colon cancer. EXAM: CT ANGIOGRAPHY CHEST WITH CONTRAST TECHNIQUE: Multidetector CT imaging of the chest was performed using the standard protocol during bolus administration of intravenous contrast. Multiplanar CT image reconstructions and MIPs were obtained to evaluate the vascular anatomy. CONTRAST:  165mL OMNIPAQUE IOHEXOL 350 MG/ML SOLN  COMPARISON:  Chest CT 06/15/2019 FINDINGS: Cardiovascular: Pulmonary arterial opacification is adequate to the segmental level. No pulmonary emboli are identified, however motion artifact limits segmental assessment primarily in the lower lobes. There is unchanged pulmonary arterial enlargement which main [place] underlying pulmonary arterial hypertension. The thoracic aorta is normal in caliber. A right jugular Port-A-Cath terminates in the right atrium. The heart is borderline enlarged. There is no pericardial effusion. Mediastinum/Nodes: Partially visualized bilateral thyroid nodules which are partially calcified on the left and similar to the prior study. No enlarged axillary, mediastinal, or hilar lymph nodes. Moderate-sized sliding hiatal hernia. Lungs/Pleura: Elevation of the right hemidiaphragm with overlying right basilar atelectasis and or consolidation. Patchy ground-glass opacities in both upper lobes, right middle lobe, and left lower lobe. Upper Abdomen: Known liver masses are not well seen, likely due to contrast timing. Grossly unchanged hypodense lesion in the upper pole of the right kidney  which may represent a cyst. Musculoskeletal: No acute osseous abnormality or suspicious osseous lesion. Thoracic spondylosis with multilevel bridging osteophytes. Review of the MIP images confirms the above findings. IMPRESSION: 1. No evidence of pulmonary emboli on this motion degraded study. 2. Patchy bilateral lung opacities consistent with pneumonia. 3. Hiatal hernia. 4. Aortic Atherosclerosis (ICD10-I70.0). Electronically Signed   By: Logan Bores M.D.   On: 09/12/2019 15:47   XR Chest Portable  Result Date: 09/12/2019 CLINICAL DATA:  Shortness of breath.  History of colon carcinoma EXAM: PORTABLE CHEST 1 VIEW COMPARISON:  July 06, 2018 FINDINGS: Port-A-Cath tip is in the superior vena cava. No pneumothorax. There is patchy airspace opacity in each mid lung and right base regions. There is associated atelectasis in the right base. Heart is upper normal in size with pulmonary vascularity normal. No adenopathy. No bone lesions. IMPRESSION: Patchy airspace disease bilaterally consistent with multifocal pneumonia. Atelectatic change also noted right base. Heart upper normal in size. No adenopathy evident. Port-A-Cath tip in superior vena cava. Electronically Signed   By: Lowella Grip III M.D.   On: 09/12/2019 14:11    EKG EKG Interpretation  Date/Time:  Monday September 12 2019 13:08:12 EST Ventricular Rate:  84 PR Interval:    QRS Duration: 92 QT Interval:  369 QTC Calculation: 437 R Axis:   69 Text Interpretation: Sinus rhythm Nonspecific ST abnormality Confirmed by Lajean Saver [phone]) on 09/12/2019 1:11:49 PM   Radiology CT Angio Chest PE W/Cm &/Or Wo Cm  Result Date: 09/12/2019 CLINICAL DATA:  Shortness of breath for 1 week. COVID-19 positive. History of colon cancer. EXAM: CT ANGIOGRAPHY CHEST WITH CONTRAST TECHNIQUE: Multidetector CT imaging of the chest was performed using the standard protocol during bolus administration of intravenous contrast. Multiplanar CT image  reconstructions and MIPs were obtained to evaluate the vascular anatomy. CONTRAST:  128mL OMNIPAQUE IOHEXOL 350 MG/ML SOLN COMPARISON:  Chest CT 06/15/2019 FINDINGS: Cardiovascular: Pulmonary arterial opacification is adequate to the segmental level. No pulmonary emboli are identified, however motion artifact limits segmental assessment primarily in the lower lobes. There is unchanged pulmonary arterial enlargement which main [place] underlying pulmonary arterial hypertension. The thoracic aorta is normal in caliber. A right jugular Port-A-Cath terminates in the right atrium. The heart is borderline enlarged. There is no pericardial effusion. Mediastinum/Nodes: Partially visualized bilateral thyroid nodules which are partially calcified on the left and similar to the prior study. No enlarged axillary, mediastinal, or hilar lymph nodes. Moderate-sized sliding hiatal hernia. Lungs/Pleura: Elevation of the right hemidiaphragm with overlying right basilar atelectasis and or consolidation. Patchy ground-glass opacities in  both upper lobes, right middle lobe, and left lower lobe. Upper Abdomen: Known liver masses are not well seen, likely due to contrast timing. Grossly unchanged hypodense lesion in the upper pole of the right kidney which may represent a cyst. Musculoskeletal: No acute osseous abnormality or suspicious osseous lesion. Thoracic spondylosis with multilevel bridging osteophytes. Review of the MIP images confirms the above findings. IMPRESSION: 1. No evidence of pulmonary emboli on this motion degraded study. 2. Patchy bilateral lung opacities consistent with pneumonia. 3. Hiatal hernia. 4. Aortic Atherosclerosis (ICD10-I70.0). Electronically Signed   By: Logan Bores M.D.   On: 09/12/2019 15:47   XR Chest Portable  Result Date: 09/12/2019 CLINICAL DATA:  Shortness of breath.  History of colon carcinoma EXAM: PORTABLE CHEST 1 VIEW COMPARISON:  July 06, 2018 FINDINGS: Port-A-Cath tip is in the  superior vena cava. No pneumothorax. There is patchy airspace opacity in each mid lung and right base regions. There is associated atelectasis in the right base. Heart is upper normal in size with pulmonary vascularity normal. No adenopathy. No bone lesions. IMPRESSION: Patchy airspace disease bilaterally consistent with multifocal pneumonia. Atelectatic change also noted right base. Heart upper normal in size. No adenopathy evident. Port-A-Cath tip in superior vena cava. Electronically Signed   By: Lowella Grip III M.D.   On: 09/12/2019 14:11    Procedures Procedures (including critical care time)  Medications Ordered in ED Medications  0.9 %  sodium chloride infusion (has no administration in time range)    ED Course  I have reviewed the triage vital signs and the nursing notes.  Pertinent labs & imaging results that were available during my care of the patient were reviewed by me and considered in my medical decision making (see chart for details).    MDM Rules/Calculators/A&P                      Iv ns. Continuous pulse ox and monitor. Ecg. Cxr. Stat labs. covid test sent.  Reviewed nursing notes and prior charts for additional history.   Labs reviewed/interpreted by me - chem normal. ddimer is high - given ca hx, will get cta r/o pe.   Xray reviewed/interpreted by me - patchy infil ?viral pna. covid test is pending.   Patient with hx asthma, mild wheezing on recheck. Decadron iv. Albuterol and atrovent tx.   Await ct.  CT reviewed/interpreted by me - no pe.   Additional labs reviewed/interpreted by me - covid Positive. Pt informed.   On recheck of pt, no wheezing, is breathing comfortably, talking on phone. Off oxygen, her room air pulse ox is staying in the 98-100% range.   Patient currently appears stable for d/c. Given morbidities, message left on infusion center line for consideration of outpatient infusion therapy.  Return precautions provided.   Sarinity Briant Sites was evaluated in Emergency Department on 09/12/2019 for the symptoms described in the history of present illness. She was evaluated in the context of the global COVID-19 pandemic, which necessitated consideration that the patient might be at risk for infection with the SARS-CoV-2 virus that causes COVID-19. Institutional protocols and algorithms that pertain to the evaluation of patients at risk for COVID-19 are in a state of rapid change based on information released by regulatory bodies including the CDC and federal and state organizations. These policies and algorithms were followed during the patient's care in the ED.     Final Clinical Impression(s) / ED Diagnoses Final diagnoses:  None  Rx / DC Orders ED Discharge Orders    None       Lajean Saver, MD 09/12/19 [phone]

## 2019-09-13 ENCOUNTER — Telehealth: Payer: Self-pay | Admitting: Hematology

## 2019-09-13 ENCOUNTER — Encounter: Payer: Self-pay | Admitting: Allergy & Immunology

## 2019-09-13 ENCOUNTER — Telehealth: Payer: Self-pay | Admitting: *Deleted

## 2019-09-13 ENCOUNTER — Telehealth: Payer: Self-pay | Admitting: Nurse Practitioner

## 2019-09-13 NOTE — Telephone Encounter (Signed)
Scheduled appt per 12/29 sch message - pt is aware of apt date and time

## 2019-09-13 NOTE — Telephone Encounter (Signed)
Called to Discuss with patient about Covid symptoms and the use of bamlanivimab, a monoclonal antibody infusion for those with mild to moderate Covid symptoms and at a high risk of hospitalization.     Pt is qualified for this infusion at the [facility] infusion center due to co-morbid conditions and/or a member of an at-risk group.    Patient Active Problem List   Diagnosis Date Noted  . Morbid obesity with BMI of 40.0-44.9, adult ([place]) 08/29/2019  . Vaginal trichomoniasis 08/29/2019  . Blood in urine 08/29/2019  . Candidiasis of skin 08/29/2019  . Disorder of vulva 08/29/2019  . Herpes simplex 08/29/2019  . Lichen sclerosus et atrophicus 08/29/2019  . Menopausal problem 08/29/2019  . Menopausal syndrome 08/29/2019  . Spasm of back muscles 08/29/2019  . Vaginitis 08/29/2019  . Vaginal irritation 08/29/2019  . Port-A-Cath in place 11/03/2018  . Counseling regarding advance care planning and goals of care 09/06/2018  . Colon cancer metastasized to liver ([place])   . Hypokalemia 09/01/2018  . Aspiration pneumonia ([place]) 08/30/2018  . Subcapsular hematoma of liver 08/27/2018  . Obesity, Class III, BMI 40-49.9 (morbid obesity) ([place]) 08/27/2018  . Metastatic carcinoma to liver ([place]) 08/27/2018  . B12 deficiency anemia 08/27/2018  . Dizziness   . Acute blood loss anemia   . Thyroid mass 08/10/2018  . Epistaxis 08/10/2018  . Iron deficiency anemia 04/28/2018  . Symptomatic anemia 04/09/2018  . Chest pain 04/09/2018  . Anxiety   . Asthma   . Hypertension   . Migraine     Patient states that symptoms started over 2 weeks ago and there does not meet criteria for infusion.

## 2019-09-13 NOTE — Telephone Encounter (Signed)
Patient called - she was seen yesterday in ED and received positive diagnosis of Covid. Patient states she thought she might be admitted, but was not.  Patient states she is a little short of breath and does not have an appetite, stating she is moving slowly. Encouraged patient to seek emergency care if shortness of breath becomes worse or if she experiences chest pain or discomfort. She verbalized understanding.  She is encouraging her family members (adolescent son and adult children) to be tested as well.  Margaret [name] verbalized understanding that her appointments with [facility] will be rescheduled after 21 day post positive Covid results.

## 2019-09-22 ENCOUNTER — Ambulatory Visit: Payer: Medicaid Other

## 2019-09-22 ENCOUNTER — Encounter: Payer: Self-pay | Admitting: Allergy & Immunology

## 2019-09-22 ENCOUNTER — Other Ambulatory Visit: Payer: Medicaid Other

## 2019-09-22 ENCOUNTER — Ambulatory Visit: Payer: Medicaid Other | Admitting: Hematology

## 2019-09-22 MED ORDER — MAGIC MOUTHWASH: 5.0000 mL | Freq: Four times a day (QID) | ORAL | 0 refills | Status: AC | PRN

## 2019-09-22 MED ORDER — MAGIC MOUTHWASH: 5.0000 mL | Freq: Four times a day (QID) | ORAL | 0 refills | Status: DC

## 2019-09-22 MED ORDER — MAGIC MOUTHWASH: 5.0000 mL | Freq: Four times a day (QID) | ORAL | 0 refills | Status: DC | PRN

## 2019-10-05 NOTE — Progress Notes (Signed)
  HEMATOLOGY/ONCOLOGY CLINIC NOTE  Date of Service: 10/06/2019  Patient Care Team: Avbuere, Edwin, MD as PCP - General (Internal Medicine) [facility]osp, Wladmits, MD as Rounding Team (Internal Medicine)   CHIEF COMPLAINTS/PURPOSE OF CONSULTATION:   Continued mx of  Metastatic Adenocarcinoma of colon   HISTORY OF PRESENTING ILLNESS:  Margaret [name] is a wonderful 48 y.o. female who has been referred to us by Dr. Edwin Avbuere for evaluation and management of Iron deficiency anemia. The pt reports that she is doing well overall.   The pt recently presented to the ED on 04/16/18 for right sided abdominal pain that was evaluated with a CT A/P which revealed liver and pancreatic cyst, recommended for outpatient MRI follow up. The pt was found to have a UTI, was treated with Rocephin and discharged with Keflex. Prior to this the pt was admitted on 04/09/18 for symptomatic anemia, presenting with HGB at 6.2, received 2 units PRBCs, one IV Iron infusion, and was encouraged to seek out GI as outpatient.  She has an appointment with [facility] on 05/19/18.   The pt reports that she is still feeling dizzy and light headed and denies feeling better after her recent blood transfusion. She notes that she dizziness presents with a feeling of warmness and that she begins hyperventilating, feeling nervous about her dizziness.  She notes that prior to her recent hospital stay, she never required a blood transfusion or IV iron replacement. She notes that as a child she had frequent nose bleeds, and was diagnosed with anemia. She also notes a history of ice picca.   She had a hysterectomy in 2012, and prior to this she had very heavy periods that began at age 8 and occurred almost constantly. She was on Depo and Mirena which did not help slow menstrual losses. Besides taking iron supplements while pregnant she has not taken PO iron replacement as she reports not tolerating it very well while pregnant.   She denies  concern for black stools or blood in the stools and has not had a colonoscopy or endoscopy before. The pt notes that she has not previously had a concern for stomach ulcers, but has acid reflux and takes Prevacid as needed for 7-8 years. She notes that she has not recently used Prevacid, for the last 3 months.  She is not taking vitamin replacements and denies any dietary restrictions.  The pt notes that she has been continuing to have lower right quadrant abdominal pain that radiates across her abdomen, presents for up to 45 minutes and occurs intermittently. She notes that her abdominal pain presented on 04/13/18.   The pt notes that was taking Ibuprofen 800mg BID for 8 years to address her back pain related to her herniated disk. She stopped taking this 4 months ago.   Of note prior to the patient's visit today, pt has had CT A/P completed on 04/16/18 with results revealing No acute findings in the abdomen/pelvis. Least 4 low-density hepatic lesions, some of which may represent small cysts or hemangiomas, however there is a 19 mm lesion in the left lobe of the liver that is incompletely characterized. Recommend nonemergent hepatic protocol MRI for further evaluation.. Possible 11 mm pancreatic nodule rising from the tail, alternatively this may represent a splenule. This can also be assessed at time of hepatic MRI. Gallstone without gallbladder inflammation. Colonic diverticulosis without diverticulitis.   Most recent lab results (04/16/18) of CBC is as follows: all values are WNL except for HGB at   9.5, HCT at 33.6, MCH at 23.8, MCHC at 28.3, RDW at 23.2, PLT at 435k. Ferritin 04/09/18 was low at 6 Vitamin B12 on 04/09/18 was at 196  On review of systems, pt reports light headedness, dizziness, lower back pain, intermittent abdominal pain, and denies black stools, blood in the stools, tingling or numbness in her legs or arms, vaginal bleeding, and any other symptoms.   INTERVAL HISTORY:   Margaret [name] returns today for management, evaluation of her newly diagnosed Metastatic Adenocarcinoma of GI primary. She is here for delayed C3D1 of FOLFIRI. The patient's last visit with [place] was on 07/19/2019. The pt reports that she is doing well overall.  The pt reports she is doing well. She contracted COVID-19 and was given a positive result on 09/12/2019 but is not sure how.  She is still feeling fatigue from COVID-19. She also has not regained her taste back. She says that everything taste like chemicals.  She says her breathing is okay, she experiences shortness of breath. Her oxygen level is at 97%  She is taking Best boy and is taking nausea medicine.  She also has some upper abdominal pain on the right side.   Her knee is the same from her previous visit in November.  She is also fasting for religous reasons.   Lab results today (10/06/19) of CBC w/diff and CMP is as follows: all values are WNL except for RBC at 3.60, Hemoglobin at 9.6, HCT at 32.5, MCHC at 29.5, RDW at 16.9, Calcium at 8.5, Albumin at 2.6.  On review of systems, pt reports nausea, fatigue, some right upper abdominal pain and denies leg swelling and any other symptoms.    MEDICAL HISTORY:  Past Medical History:  Diagnosis Date  . Allergic rhinitis   . Anxiety diagnosed in 1990  . Arthritis   . Asthma   . colon ca with liver mets dx'd 08/2018  . Dyspnea    with low blood count hx of anemia  . Eczema   . History of blood transfusion   . Hypertension   . Lower back pain   . Migraine   . Sleep apnea    uses CPAP    SURGICAL HISTORY: Past Surgical History:  Procedure Laterality Date  . ABDOMINAL HYSTERECTOMY    . cortisone injections     knees bilat and back   . IR IMAGING GUIDED PORT INSERTION  09/14/2018  . iron infusion    . LIVER BIOPSY      SOCIAL HISTORY: Social History   Socioeconomic History  . Marital status: Single    Spouse name: Not on file  . Number of children: Not on  file  . Years of education: Not on file  . Highest education level: Not on file  Occupational History  . Not on file  Tobacco Use  . Smoking status: Former Smoker    Quit date: 07/30/1999    Years since quitting: 20.1  . Smokeless tobacco: Never Used  Substance and Sexual Activity  . Alcohol use: No  . Drug use: No  . Sexual activity: Not on file  Other Topics Concern  . Not on file  Social History Narrative  . Not on file   Social Determinants of Health   Financial Resource Strain:   . Difficulty of Paying Living Expenses: Not on file  Food Insecurity:   . Worried About Charity fundraiser in the Last Year: Not on file  . Ran Out of Food  in the Last Year: Not on file  Transportation Needs:   . Lack of Transportation (Medical): Not on file  . Lack of Transportation (Non-Medical): Not on file  Physical Activity:   . Days of Exercise per Week: Not on file  . Minutes of Exercise per Session: Not on file  Stress:   . Feeling of Stress : Not on file  Social Connections:   . Frequency of Communication with Friends and Family: Not on file  . Frequency of Social Gatherings with Friends and Family: Not on file  . Attends Religious Services: Not on file  . Active Member of Clubs or Organizations: Not on file  . Attends Club or Organization Meetings: Not on file  . Marital Status: Not on file  Intimate Partner Violence:   . Fear of Current or Ex-Partner: Not on file  . Emotionally Abused: Not on file  . Physically Abused: Not on file  . Sexually Abused: Not on file    FAMILY HISTORY: Family History  Problem Relation Age of Onset  . Diabetes Father     ALLERGIES:  is allergic to latex; tape; and tylenol with codeine #3 [acetaminophen-codeine].  MEDICATIONS:  Current Outpatient Medications  Medication Sig Dispense Refill  . albuterol (PROAIR HFA) 108 (90 Base) MCG/ACT inhaler INHALE TWO puffs BY MOUTH into the lungs EVERY 4 HOURS AS NEEDED FOR WHEEZING OR shortness of  breath 18 g 1  . Boric Acid 4 % SOLN Place 1 capsule vaginally every Friday.     . budesonide-formoterol (SYMBICORT) 160-4.5 MCG/ACT inhaler Inhale 2 puffs into the lungs 2 (two) times a day. 10.2 g 5  . cetirizine (ZYRTEC) 10 MG tablet Take 1 tablet (10 mg total) by mouth daily. 30 tablet 5  . clobetasol ointment (TEMOVATE) 0.05 % Apply 1 application topically 2 (two) times daily. Use for Lichen Sclerosis    . Cyanocobalamin (B-12) 1000 MCG SUBL Place 1,000 mcg under the tongue daily. 30 each 3  . dexamethasone (DECADRON) 4 MG tablet TAKE 2 TABLETS (8 MG TOTAL) BY MOUTH DAILY  START THE DAY AFTER CHEMOTHERAPY FOR 2 DAYS. TAKE WITH FOOD. 30 tablet 1  . diazepam (VALIUM) 5 MG tablet Take 1 tablet (5 mg total) by mouth daily as needed for anxiety (panic attacks). 30 tablet 0  . diclofenac sodium (VOLTAREN) 1 % GEL Apply 2 g topically 4 (four) times daily as needed (for back/knee pain.).     . dicyclomine (BENTYL) 20 MG tablet Take 1 tablet (20 mg total) by mouth 2 (two) times daily. (Patient taking differently: Take 20 mg by mouth 3 (three) times daily before meals. ) 20 tablet 0  . dronabinol (MARINOL) 5 MG capsule Take 1 capsule (5 mg total) by mouth 2 (two) times daily before a meal. 60 capsule 0  . ergocalciferol (VITAMIN D2) 50000 UNITS capsule Take 50,000 Units by mouth every Monday.     . lidocaine-prilocaine (EMLA) cream Apply to affected area once 30 g 3  . lisinopril-hydrochlorothiazide (PRINZIDE,ZESTORETIC) 20-12.5 MG tablet Take 1 tablet by mouth daily.    . methocarbamol (ROBAXIN) 750 MG tablet Take 750 mg by mouth every 8 (eight) hours as needed (back spasms.).     . montelukast (SINGULAIR) 10 MG tablet Take 1 tablet (10 mg total) by mouth at bedtime. 30 tablet 6  . nystatin-triamcinolone (MYCOLOG II) cream Apply 1 application topically 2 (two) times daily as needed (irritation). Use in skin folds and under breast     . Olopatadine HCl (  PAZEO) 0.7 % SOLN Apply 1 drop to eye daily. 2.5 mL  5  . ondansetron (ZOFRAN) 8 MG tablet Take 1 tablet (8 mg total) by mouth 2 (two) times daily as needed for refractory nausea / vomiting. Start on day 3 after chemotherapy. 30 tablet 0  . pantoprazole (PROTONIX) 40 MG tablet Take 1 tablet (40 mg total) by mouth daily before breakfast. 30 tablet 1  . pimecrolimus (ELIDEL) 1 % cream Apply 1 application topically 2 (two) times daily. 30 g 6  . prochlorperazine (COMPAZINE) 10 MG tablet Take 1 tablet (10 mg total) by mouth every 6 (six) hours as needed (Nausea or vomiting). 30 tablet 1  . saccharomyces boulardii (FLORASTOR) 250 MG capsule Take 1 capsule (250 mg total) by mouth 2 (two) times daily.    . traMADol (ULTRAM) 50 MG tablet Take 1-2 tablets (50-100 mg total) by mouth every 6 (six) hours as needed for moderate pain or severe pain. 90 tablet 0  . triamcinolone (NASACORT) 55 MCG/ACT AERO nasal inhaler Place 2 sprays into the nose daily as needed (for allergies). 1 Inhaler 5  . valACYclovir (VALTREX) 500 MG tablet Take 500 mg by mouth 2 (two) times daily as needed (for cold sores).      No current facility-administered medications for this visit.    Facility-Administered Medications Ordered in Other Visits  Medication Dose Route Frequency Provider Last Rate Last Dose  . fluorouracil (ADRUCIL) 6,950 mg in sodium chloride 0.9 % 111 mL chemo infusion  2,400 mg/m2 (Treatment Plan Recorded) Intravenous 1 day or 1 dose Kale, Gautam Kishore, MD      . leucovorin 1,156 mg in dextrose 5 % 250 mL infusion  400 mg/m2 (Treatment Plan Recorded) Intravenous Once Kale, Gautam Kishore, MD 77 mL/hr at 05/24/19 1242 1,156 mg at 05/24/19 1242  . oxaliplatin (ELOXATIN) 250 mg in dextrose 5 % 500 mL chemo infusion  250 mg Intravenous Once Kale, Gautam Kishore, MD 138 mL/hr at 05/24/19 1245 250 mg at 05/24/19 1245    REVIEW OF SYSTEMS:   A 10+ POINT REVIEW OF SYSTEMS WAS OBTAINED including neurology, dermatology, psychiatry, cardiac, respiratory, lymph, extremities,  GI, GU, Musculoskeletal, constitutional, breasts, reproductive, HEENT.  All pertinent positives are noted in the HPI.  All others are negative.    PHYSICAL EXAMINATION:  ECOG FS:2 - Symptomatic, <50% confined to bed  Vitals:   10/06/19 0905  BP: 135/88  Pulse: 97  Resp: 18  Temp: 98.3 F (36.8 C)  SpO2: 99%   Wt Readings from Last 3 Encounters:  10/06/19 (!) 400 lb 12.8 oz (181.8 kg)  09/12/19 (!) 416 lb (188.7 kg)  07/19/19 (!) 412 lb 14.4 oz (187.3 kg)   Body mass index is 60.94 kg/m.    Performed In a Chair  GENERAL:alert, in no acute distress and comfortable SKIN: no acute rashes, no significant lesions EYES: conjunctiva are pink and non-injected, sclera anicteric OROPHARYNX: MMM, no exudates, no oropharyngeal erythema or ulceration NECK: supple, no JVD LYMPH:  no palpable lymphadenopathy in the cervical, axillary or inguinal regions LUNGS: clear to auscultation b/l with normal respiratory effort HEART: regular rate & rhythm ABDOMEN:  normoactive bowel sounds , non tender, not distended. Extremity: no pedal edema PSYCH: alert & oriented x 3 with fluent speech NEURO: no focal motor/sensory deficits   LABORATORY DATA:  I have reviewed the data as listed  . CBC Latest Ref Rng & Units 10/06/2019 09/12/2019 07/19/2019  WBC 4.0 - 10.5 K/uL 8.6 3.3(L) 2.6(L)  Hemoglobin   12.0 - 15.0 g/dL 9.6(L) 11.0(L) 9.3(L)  Hematocrit 36.0 - 46.0 % 32.5(L) 37.8 31.1(L)  Platelets 150 - 400 K/uL 337 196 215   ANC 900 . CMP Latest Ref Rng & Units 10/06/2019 09/12/2019 07/19/2019  Glucose 70 - 99 mg/dL 90 97 92  BUN 6 - 20 mg/dL 13 16 12  Creatinine 0.44 - 1.00 mg/dL 0.78 0.98 0.80  Sodium 135 - 145 mmol/L 142 141 143  Potassium 3.5 - 5.1 mmol/L 3.9 3.8 4.1  Chloride 98 - 111 mmol/L 109 108 110  CO2 22 - 32 mmol/L 23 23 24  Calcium 8.9 - 10.3 mg/dL 8.5(L) 8.5(L) 8.9  Total Protein 6.5 - 8.1 g/dL 7.3 7.4 6.7  Total Bilirubin 0.3 - 1.2 mg/dL 0.4 0.4 0.3  Alkaline Phos 38 - 126 U/L  90 88 81  AST 15 - 41 U/L 41 53(H) 14(L)  ALT 0 - 44 U/L 25 29 9   . Lab Results  Component Value Date   IRON 53 07/19/2019   TIBC 233 (L) 07/19/2019   IRONPCTSAT 23 07/19/2019   (Iron and TIBC)  Lab Results  Component Value Date   FERRITIN 322 (H) 07/19/2019   B12  - 196--> 584  07/22/18 Liver Biopsy:   08/27/18 Repeat Liver Biopsy:   Molecular Pathology:     RADIOGRAPHIC STUDIES: I have personally reviewed the radiological images as listed and agreed with the findings in the report. CT Angio Chest PE W/Cm &/Or Wo Cm  Result Date: 09/12/2019 CLINICAL DATA:  Shortness of breath for 1 week. COVID-19 positive. History of colon cancer. EXAM: CT ANGIOGRAPHY CHEST WITH CONTRAST TECHNIQUE: Multidetector CT imaging of the chest was performed using the standard protocol during bolus administration of intravenous contrast. Multiplanar CT image reconstructions and MIPs were obtained to evaluate the vascular anatomy. CONTRAST:  100mL OMNIPAQUE IOHEXOL 350 MG/ML SOLN COMPARISON:  Chest CT 06/15/2019 FINDINGS: Cardiovascular: Pulmonary arterial opacification is adequate to the segmental level. No pulmonary emboli are identified, however motion artifact limits segmental assessment primarily in the lower lobes. There is unchanged pulmonary arterial enlargement which main [place] underlying pulmonary arterial hypertension. The thoracic aorta is normal in caliber. A right jugular Port-A-Cath terminates in the right atrium. The heart is borderline enlarged. There is no pericardial effusion. Mediastinum/Nodes: Partially visualized bilateral thyroid nodules which are partially calcified on the left and similar to the prior study. No enlarged axillary, mediastinal, or hilar lymph nodes. Moderate-sized sliding hiatal hernia. Lungs/Pleura: Elevation of the right hemidiaphragm with overlying right basilar atelectasis and or consolidation. Patchy ground-glass opacities in both upper lobes, right middle  lobe, and left lower lobe. Upper Abdomen: Known liver masses are not well seen, likely due to contrast timing. Grossly unchanged hypodense lesion in the upper pole of the right kidney which may represent a cyst. Musculoskeletal: No acute osseous abnormality or suspicious osseous lesion. Thoracic spondylosis with multilevel bridging osteophytes. Review of the MIP images confirms the above findings. IMPRESSION: 1. No evidence of pulmonary emboli on this motion degraded study. 2. Patchy bilateral lung opacities consistent with pneumonia. 3. Hiatal hernia. 4. Aortic Atherosclerosis (ICD10-I70.0). Electronically Signed   By: Allen  Grady M.D.   On: 09/12/2019 15:47   XR Chest Portable  Result Date: 09/12/2019 CLINICAL DATA:  Shortness of breath.  History of colon carcinoma EXAM: PORTABLE CHEST 1 VIEW COMPARISON:  July 06, 2018 FINDINGS: Port-A-Cath tip is in the superior vena cava. No pneumothorax. There is patchy airspace opacity in each mid lung and right base   regions. There is associated atelectasis in the right base. Heart is upper normal in size with pulmonary vascularity normal. No adenopathy. No bone lesions. IMPRESSION: Patchy airspace disease bilaterally consistent with multifocal pneumonia. Atelectatic change also noted right base. Heart upper normal in size. No adenopathy evident. Port-A-Cath tip in superior vena cava. Electronically Signed   By: William  Woodruff III M.D.   On: 09/12/2019 14:11    ASSESSMENT & PLAN:   48 y.o. female with  1. Iron Deficiency Anemia - ? Previous heavy periods vs GI losses  Recent heavy NSAID use ? Ulcer  2. B12 deficiency Labs upon initial presentation from 04/16/18, HGB at 9.5. The 04/09/18 Ferritin was low at 6 and Vitamin B12 was at 196 -No antiparietal antibodies, no intrinsic factor antibodies, normal TSH all from 04/28/18  PLAN: -Continue NSAID avoidance  -continue 1000mcg Vitamin B12 sublingually daily - B12 improved from 196 to  584  3.MetastaticAdenocarcinoma of cecum with liver mets. KRAS mutated.  04/16/18 CT A/P with pt revealed No acute findings in the abdomen/pelvis. Least 4 low-density hepatic lesions, some of which may represent small cysts or hemangiomas, however there is a 19 mm lesion in the left lobe of the liver that is incompletely characterized. Recommend nonemergent hepatic protocol MRI for further evaluation.. Possible 11 mm pancreatic nodule rising from the tail, alternatively this may represent a splenule. This can also be assessed at time of hepatic MRI. Gallstone without gallbladder inflammation. Colonic diverticulosis without diverticulitis.   07/09/18 Tumor marker work up: CEA normal at 1.32, AFP normal at 1.5, LDH normal at 138, CA 125 normal at 5.9, CA 19-9 normal at <2   07/22/18 Liver biopsy results revealed benign hepatic tissue, and was not diagnostic   07/16/18 PET/CT revealedThere is a solid hypermetabolic mass involving the cecum, worrisome for colonic primary neoplasm. Correlation withcolonoscopy Findings. 2. Multiple hypermetabolic liver lesions compatible with metastaticdisease. 3. Large low-density lesion in right lobe of thyroid gland. Advise further evaluation with thyroid sonography. 4. Aortic Atherosclerosis. 5. Small hiatal hernia.   08/27/18 Liver biopsy revealed Metastatic adenocarcinoma, consistent with gastrointestinal primary 08/27/18 Molecular pathology- KRAS mutation positive   09/03/18 ECHO revealed an LV EF of 60-65%, mild diastolic dysfunction; mild RVE; dilated PA; mild TR; moderate to severe pulmonary hypertension  09/17/18 CEA at 2.39, which was the last CEA pre-treatment value   12/13/18 CT A/P revealed Interval response to therapy. There is been decrease in size of multifocal liver metastasis. No new or progressive findings. 2. Hiatal hernia.   Did postpone C6 by one week due to concern for mild infection.   Did postpone C7 by two weeks for  infection  06/15/2019 CT C/A/P ([id number]) ([id number]) revealed "1. Significant interval enlargement of ill-defined, hypodense masses of the liver, the largest index mass of the inferior right lobe, segment VI, measuring 4.1 x 3.7 cm, previously 1.0 cm when measured similarly (series 2, image 58). There are additional lesions in caudate (series 2, image 54), lateral right lobe (series 2, image 52) and liver dome (series 2, image 39), all substantially enlarged compared to prior examination. Findings are consistent with worsened metastatic disease. 2. Redemonstrated residual mass at the cecal base appears thickened in comparison to prior examination, approximately 1.2 cm, previously 5 mm when measured similarly (series 4, image 61, series 2, image 110). 3. Stable 2 mm nodules of the left lung base (series 6, image 105, 116), likely benign and incidental. No definite evidence of metastatic disease in the chest. Attention on follow-up."1.   Significant interval enlargement of ill-defined, hypodense masses of the liver, the largest index mass of the inferior right lobe, segment VI, measuring 4.1 x 3.7 cm, previously 1.0 cm when measured similarly (series 2, image 58). There are additional lesions in caudate (series 2, image 54), lateral right lobe (series 2, image 52) and liver dome (series 2, image 39), all substantially enlarged compared to prior examination. Findings are consistent with worsened metastatic disease. 2. Redemonstrated residual mass at the cecal base appears thickened in comparison to prior examination, approximately 1.2 cm, previously 5 mm when measured similarly (series 4, image 61, series 2, image 110). 3. Stable 2 mm nodules of the left lung base (series 6, image 105, 116), likely benign and incidental. No definite evidence of metastatic disease in the chest. Attention on follow-up."  PLAN: -Discussed pt labwork today, 10/06/19; all values are WNL except for RBC at 3.60, Hemoglobin at 9.6, HCT  at 32.5, MCHC at 29.5, RDW at 16.9, Calcium at 8.5, Albumin at 2.6. -Discussed how she felt when she had COVID-19 -Discussed whether or not she felt back to her baseline before contracting COVID-19 -Discussed that pending chemistries will determine if we continue with treatment today -no clinical symptoms suggestive of overt colon cancer progression at this time -discussed getting rpt CTCAP to evaluate current status of her metastatic colon cancer given treatment delays on account of COVID 19  FOLLOW UP: CT chest/abd/pelvis in 1 week Please schedule next 2 cycles of FOLFIRI with portflush, labs and MD visit   The total time spent in the appt was 30 minutes and more than 50% was on counseling and direct patient cares.  All of the patient's questions were answered with apparent satisfaction. The patient knows to call the clinic with any problems, questions or concerns.    Sullivan Lone MD MS AAHIVMS [facility] Hematology/Oncology Physician [facility]  (Office):       [phone] (Work cell):  [phone] (Fax):           [phone]  10/05/2019 5:07 PM   I, Scot Dock, am acting as a scribe for Dr. Sullivan Lone.   .I have reviewed the above documentation for accuracy and completeness, and I agree with the above. Brunetta Genera MD       ADDENDUM:  -Chemistries are normal. Albumin is slightly low. Could be from fasting.  -Will continue with treatment with Folfiri -Will hold Avastin

## 2019-10-06 ENCOUNTER — Telehealth: Payer: Self-pay | Admitting: Hematology

## 2019-10-06 ENCOUNTER — Inpatient Hospital Stay (HOSPITAL_BASED_OUTPATIENT_CLINIC_OR_DEPARTMENT_OTHER): Payer: Medicaid Other | Admitting: Medical

## 2019-10-06 ENCOUNTER — Inpatient Hospital Stay: Payer: Medicaid Other | Attending: Hematology

## 2019-10-06 ENCOUNTER — Inpatient Hospital Stay: Payer: Medicaid Other

## 2019-10-06 ENCOUNTER — Other Ambulatory Visit: Payer: Self-pay

## 2019-10-06 ENCOUNTER — Inpatient Hospital Stay (HOSPITAL_BASED_OUTPATIENT_CLINIC_OR_DEPARTMENT_OTHER): Payer: Medicaid Other | Admitting: Hematology

## 2019-10-06 VITALS — BP 149/96 | HR 88 | Resp 17

## 2019-10-06 VITALS — BP 135/88 | HR 97 | Temp 98.3°F | Resp 18 | Ht 68.0 in | Wt >= 6400 oz

## 2019-10-06 DIAGNOSIS — C189 Malignant neoplasm of colon, unspecified: Secondary | ICD-10-CM

## 2019-10-06 DIAGNOSIS — C180 Malignant neoplasm of cecum: Secondary | ICD-10-CM | POA: Diagnosis present

## 2019-10-06 DIAGNOSIS — R5383 Other fatigue: Secondary | ICD-10-CM | POA: Insufficient documentation

## 2019-10-06 DIAGNOSIS — E538 Deficiency of other specified B group vitamins: Secondary | ICD-10-CM | POA: Diagnosis not present

## 2019-10-06 DIAGNOSIS — D509 Iron deficiency anemia, unspecified: Secondary | ICD-10-CM

## 2019-10-06 DIAGNOSIS — Z79899 Other long term (current) drug therapy: Secondary | ICD-10-CM | POA: Insufficient documentation

## 2019-10-06 DIAGNOSIS — R109 Unspecified abdominal pain: Secondary | ICD-10-CM | POA: Insufficient documentation

## 2019-10-06 DIAGNOSIS — Z5189 Encounter for other specified aftercare: Secondary | ICD-10-CM | POA: Diagnosis present

## 2019-10-06 DIAGNOSIS — D500 Iron deficiency anemia secondary to blood loss (chronic): Secondary | ICD-10-CM | POA: Diagnosis not present

## 2019-10-06 DIAGNOSIS — C787 Secondary malignant neoplasm of liver and intrahepatic bile duct: Secondary | ICD-10-CM

## 2019-10-06 DIAGNOSIS — Z5111 Encounter for antineoplastic chemotherapy: Secondary | ICD-10-CM | POA: Diagnosis present

## 2019-10-06 DIAGNOSIS — R110 Nausea: Secondary | ICD-10-CM | POA: Diagnosis not present

## 2019-10-06 DIAGNOSIS — Z87891 Personal history of nicotine dependence: Secondary | ICD-10-CM | POA: Insufficient documentation

## 2019-10-06 DIAGNOSIS — Z7189 Other specified counseling: Secondary | ICD-10-CM

## 2019-10-06 DIAGNOSIS — D701 Agranulocytosis secondary to cancer chemotherapy: Secondary | ICD-10-CM

## 2019-10-06 DIAGNOSIS — R0602 Shortness of breath: Secondary | ICD-10-CM | POA: Diagnosis not present

## 2019-10-06 DIAGNOSIS — T451X5A Adverse effect of antineoplastic and immunosuppressive drugs, initial encounter: Secondary | ICD-10-CM

## 2019-10-06 DIAGNOSIS — Z95828 Presence of other vascular implants and grafts: Secondary | ICD-10-CM

## 2019-10-06 DIAGNOSIS — I272 Pulmonary hypertension, unspecified: Secondary | ICD-10-CM

## 2019-10-06 LAB — CMP (CANCER CENTER ONLY)
ALT: 25 U/L (ref 0–44)
AST: 41 U/L (ref 15–41)
Albumin: 2.6 g/dL — ABNORMAL LOW (ref 3.5–5.0)
Alkaline Phosphatase: 90 U/L (ref 38–126)
Anion gap: 10 (ref 5–15)
BUN: 13 mg/dL (ref 6–20)
CO2: 23 mmol/L (ref 22–32)
Calcium: 8.5 mg/dL — ABNORMAL LOW (ref 8.9–10.3)
Chloride: 109 mmol/L (ref 98–111)
Creatinine: 0.78 mg/dL (ref 0.44–1.00)
GFR, Est AFR Am: >60 mL/min (ref >60)
GFR, Estimated: >60 mL/min (ref >60)
Glucose, Bld: 90 mg/dL (ref 70–99)
Potassium: 3.9 mmol/L (ref 3.5–5.1)
Sodium: 142 mmol/L (ref 135–145)
Total Bilirubin: 0.4 mg/dL (ref 0.3–1.2)
Total Protein: 7.3 g/dL (ref 6.5–8.1)

## 2019-10-06 LAB — CBC WITH DIFFERENTIAL/PLATELET
Abs Immature Granulocytes: 0.04 10*3/uL (ref 0.00–0.07)
Basophils Absolute: 0 10*3/uL (ref 0.0–0.1)
Basophils Relative: 0 %
Eosinophils Absolute: 0.1 10*3/uL (ref 0.0–0.5)
Eosinophils Relative: 1 %
HCT: 32.5 % — ABNORMAL LOW (ref 36.0–46.0)
Hemoglobin: 9.6 g/dL — ABNORMAL LOW (ref 12.0–15.0)
Immature Granulocytes: 1 %
Lymphocytes Relative: 22 %
Lymphs Abs: 1.9 10*3/uL (ref 0.7–4.0)
MCH: 26.7 pg (ref 26.0–34.0)
MCHC: 29.5 g/dL — ABNORMAL LOW (ref 30.0–36.0)
MCV: 90.3 fL (ref 80.0–100.0)
Monocytes Absolute: 0.7 10*3/uL (ref 0.1–1.0)
Monocytes Relative: 8 %
Neutro Abs: 5.8 10*3/uL (ref 1.7–7.7)
Neutrophils Relative %: 68 %
Platelets: 337 10*3/uL (ref 150–400)
RBC: 3.6 MIL/uL — ABNORMAL LOW (ref 3.87–5.11)
RDW: 16.9 % — ABNORMAL HIGH (ref 11.5–15.5)
WBC: 8.6 10*3/uL (ref 4.0–10.5)
nRBC: 0 % (ref 0.0–0.2)

## 2019-10-06 MED ORDER — LORAZEPAM 2 MG/ML IJ SOLN
0.5000 mg | Freq: Once | INTRAMUSCULAR | Status: AC
  Administered 2019-10-06: 0.5 mg via INTRAVENOUS

## 2019-10-06 MED ORDER — DEXAMETHASONE SODIUM PHOSPHATE 10 MG/ML IJ SOLN
INTRAMUSCULAR | Status: AC
  Filled 2019-10-06: qty 1

## 2019-10-06 MED ORDER — ATROPINE SULFATE 1 MG/ML IJ SOLN
INTRAMUSCULAR | Status: AC
  Filled 2019-10-06: qty 1

## 2019-10-06 MED ORDER — IRINOTECAN HCL CHEMO INJECTION 100 MG/5ML
180.0000 mg/m2 | Freq: Once | INTRAVENOUS | Status: AC
  Administered 2019-10-06: 540 mg via INTRAVENOUS
  Filled 2019-10-06: qty 27

## 2019-10-06 MED ORDER — LEUCOVORIN CALCIUM INJECTION 350 MG
400.0000 mg/m2 | Freq: Once | INTRAVENOUS | Status: AC
  Administered 2019-10-06: 1200 mg via INTRAVENOUS
  Filled 2019-10-06: qty 60

## 2019-10-06 MED ORDER — SODIUM CHLORIDE 0.9 % IV SOLN
2400.0000 mg/m2 | INTRAVENOUS | Status: DC
  Administered 2019-10-06: 13:00:00 7200 mg via INTRAVENOUS
  Filled 2019-10-06: qty 144

## 2019-10-06 MED ORDER — PALONOSETRON HCL INJECTION 0.25 MG/5ML
INTRAVENOUS | Status: AC
  Filled 2019-10-06: qty 5

## 2019-10-06 MED ORDER — ATROPINE SULFATE 0.4 MG/ML IJ SOLN
INTRAMUSCULAR | Status: AC
  Filled 2019-10-06: qty 1

## 2019-10-06 MED ORDER — ATROPINE SULFATE 1 MG/ML IJ SOLN
0.5000 mg | Freq: Once | INTRAMUSCULAR | Status: AC | PRN
  Administered 2019-10-06: 0.5 mg via INTRAVENOUS

## 2019-10-06 MED ORDER — LORAZEPAM 2 MG/ML IJ SOLN
INTRAMUSCULAR | Status: AC
  Filled 2019-10-06: qty 1

## 2019-10-06 MED ORDER — SODIUM CHLORIDE 0.9% FLUSH
10.0000 mL | Freq: Once | INTRAVENOUS | Status: AC
  Administered 2019-10-06: 10 mL
  Filled 2019-10-06: qty 10

## 2019-10-06 MED ORDER — SODIUM CHLORIDE 0.9 % IV SOLN
Freq: Once | INTRAVENOUS | Status: AC
  Filled 2019-10-06: qty 250

## 2019-10-06 MED ORDER — DEXAMETHASONE SODIUM PHOSPHATE 10 MG/ML IJ SOLN
10.0000 mg | Freq: Once | INTRAMUSCULAR | Status: AC
  Administered 2019-10-06: 10 mg via INTRAVENOUS

## 2019-10-06 MED ORDER — ATROPINE SULFATE 1 MG/ML IJ SOLN: 0.4000 mg | Freq: Once | INTRAMUSCULAR | Status: DC

## 2019-10-06 MED ORDER — PALONOSETRON HCL INJECTION 0.25 MG/5ML
0.2500 mg | Freq: Once | INTRAVENOUS | Status: AC
  Administered 2019-10-06: 0.25 mg via INTRAVENOUS

## 2019-10-06 NOTE — Patient Instructions (Signed)
[facility] Discharge Instructions for Patients Receiving Chemotherapy  Today you received the following chemotherapy agents: Irinotecan, leucovorin, 5FU  To help prevent nausea and vomiting after your treatment, we encourage you to take your nausea medication as directed.   If you develop nausea and vomiting that is not controlled by your nausea medication, call the clinic.   BELOW ARE SYMPTOMS THAT SHOULD BE REPORTED IMMEDIATELY:  *FEVER GREATER THAN 100.5 F  *CHILLS WITH OR WITHOUT FEVER  NAUSEA AND VOMITING THAT IS NOT CONTROLLED WITH YOUR NAUSEA MEDICATION  *UNUSUAL SHORTNESS OF BREATH  *UNUSUAL BRUISING OR BLEEDING  TENDERNESS IN MOUTH AND THROAT WITH OR WITHOUT PRESENCE OF ULCERS  *URINARY PROBLEMS  *BOWEL PROBLEMS  UNUSUAL RASH Items with * indicate a potential emergency and should be followed up as soon as possible.  Feel free to call the clinic should you have any questions or concerns. The clinic phone number is [phone].  Please show the CHEMO ALERT CARD at check-in to the Emergency Department and triage nurse.   

## 2019-10-06 NOTE — Progress Notes (Signed)
This patient was seen in the infusion room today after she had finished an infusion of irinotecan and leucovorin.  She had gotten up to go to the bathroom when she became nauseated, vomited, and had shortness of breath and lightheadedness.  She had been premedicated with Aloxi, dexamethasone, and had received atropine.  She was given Ativan 0.5 mg IV x1 with resolution of her symptoms.  She had her 5-FU pump connected and was released home.  Her blood pressure returned at 149/96 with a pulse of 84 and an oxygen saturation of 100% on room air.  The patient reported that she had felt fine until getting up to go to the bathroom.  Sandi Mealy, MHS, PA-C Physician Assistant

## 2019-10-06 NOTE — Progress Notes (Signed)
Patient complete Irinotecan/leucovorin infusion, ambulated to the bathroom. Came back to the room SOB, hot, light-headed, and nauseas. Patient then started to vomit. Contacted Sandi Mealy PA and administered Ativan per verbal order. Patient remained in the room for an additional 30 minutes until she felt that all symptoms resolved. Connected to pump prior to DC. Patient was assisted to the car via wheelchair by NT and understands to call with any questions or concerns.

## 2019-10-06 NOTE — Telephone Encounter (Signed)
Scheduled appt per 1/21 los.  Patient will get a print put after treatment

## 2019-10-08 ENCOUNTER — Inpatient Hospital Stay: Payer: Medicaid Other

## 2019-10-08 ENCOUNTER — Other Ambulatory Visit: Payer: Self-pay

## 2019-10-08 VITALS — BP 134/92 | HR 88 | Temp 98.1°F | Resp 18

## 2019-10-08 DIAGNOSIS — Z5189 Encounter for other specified aftercare: Secondary | ICD-10-CM | POA: Diagnosis not present

## 2019-10-08 DIAGNOSIS — Z7189 Other specified counseling: Secondary | ICD-10-CM

## 2019-10-08 DIAGNOSIS — C189 Malignant neoplasm of colon, unspecified: Secondary | ICD-10-CM

## 2019-10-08 MED ORDER — SODIUM CHLORIDE 0.9% FLUSH
10.0000 mL | INTRAVENOUS | Status: DC | PRN
  Administered 2019-10-08: 10 mL
  Filled 2019-10-08: qty 10

## 2019-10-08 MED ORDER — PEGFILGRASTIM-CBQV 6 MG/0.6ML ~~LOC~~ SOSY
6.0000 mg | PREFILLED_SYRINGE | Freq: Once | SUBCUTANEOUS | Status: AC
  Administered 2019-10-08: 6 mg via SUBCUTANEOUS

## 2019-10-08 MED ORDER — HEPARIN SOD (PORK) LOCK FLUSH 100 UNIT/ML IV SOLN
500.0000 [IU] | Freq: Once | INTRAVENOUS | Status: AC | PRN
  Administered 2019-10-08: 500 [IU]
  Filled 2019-10-08: qty 5

## 2019-10-13 ENCOUNTER — Telehealth: Payer: Self-pay | Admitting: *Deleted

## 2019-10-13 ENCOUNTER — Other Ambulatory Visit: Payer: Self-pay | Admitting: Hematology

## 2019-10-13 ENCOUNTER — Ambulatory Visit (HOSPITAL_COMMUNITY): Payer: Medicaid Other

## 2019-10-13 ENCOUNTER — Encounter: Payer: Self-pay | Admitting: Hematology

## 2019-10-13 DIAGNOSIS — Z7189 Other specified counseling: Secondary | ICD-10-CM

## 2019-10-13 DIAGNOSIS — C189 Malignant neoplasm of colon, unspecified: Secondary | ICD-10-CM

## 2019-10-13 MED ORDER — LORAZEPAM 1 MG PO TABS: 1.0000 mg | ORAL_TABLET | Freq: Four times a day (QID) | ORAL | 0 refills | Status: DC | PRN

## 2019-10-13 NOTE — Telephone Encounter (Signed)
Patient called - cannot have CT today due to nausea. She will reschedule. Requests refill of Ativan

## 2019-10-20 ENCOUNTER — Inpatient Hospital Stay: Payer: Medicaid Other

## 2019-10-20 ENCOUNTER — Inpatient Hospital Stay: Payer: Medicaid Other | Attending: Hematology

## 2019-10-20 ENCOUNTER — Inpatient Hospital Stay (HOSPITAL_BASED_OUTPATIENT_CLINIC_OR_DEPARTMENT_OTHER): Payer: Medicaid Other | Admitting: Hematology

## 2019-10-20 ENCOUNTER — Other Ambulatory Visit: Payer: Self-pay

## 2019-10-20 VITALS — BP 151/96 | HR 108 | Temp 98.0°F | Resp 18 | Ht 68.0 in | Wt >= 6400 oz

## 2019-10-20 VITALS — BP 118/84 | HR 94 | Resp 18

## 2019-10-20 DIAGNOSIS — R1031 Right lower quadrant pain: Secondary | ICD-10-CM | POA: Diagnosis not present

## 2019-10-20 DIAGNOSIS — C787 Secondary malignant neoplasm of liver and intrahepatic bile duct: Secondary | ICD-10-CM | POA: Insufficient documentation

## 2019-10-20 DIAGNOSIS — Z87891 Personal history of nicotine dependence: Secondary | ICD-10-CM | POA: Insufficient documentation

## 2019-10-20 DIAGNOSIS — I10 Essential (primary) hypertension: Secondary | ICD-10-CM | POA: Insufficient documentation

## 2019-10-20 DIAGNOSIS — Z791 Long term (current) use of non-steroidal anti-inflammatories (NSAID): Secondary | ICD-10-CM | POA: Insufficient documentation

## 2019-10-20 DIAGNOSIS — M545 Low back pain: Secondary | ICD-10-CM | POA: Insufficient documentation

## 2019-10-20 DIAGNOSIS — Z5189 Encounter for other specified aftercare: Secondary | ICD-10-CM | POA: Diagnosis present

## 2019-10-20 DIAGNOSIS — C189 Malignant neoplasm of colon, unspecified: Secondary | ICD-10-CM

## 2019-10-20 DIAGNOSIS — E538 Deficiency of other specified B group vitamins: Secondary | ICD-10-CM | POA: Insufficient documentation

## 2019-10-20 DIAGNOSIS — D509 Iron deficiency anemia, unspecified: Secondary | ICD-10-CM

## 2019-10-20 DIAGNOSIS — Z7189 Other specified counseling: Secondary | ICD-10-CM

## 2019-10-20 DIAGNOSIS — Z7952 Long term (current) use of systemic steroids: Secondary | ICD-10-CM | POA: Insufficient documentation

## 2019-10-20 DIAGNOSIS — Z5111 Encounter for antineoplastic chemotherapy: Secondary | ICD-10-CM | POA: Diagnosis not present

## 2019-10-20 DIAGNOSIS — C180 Malignant neoplasm of cecum: Secondary | ICD-10-CM | POA: Insufficient documentation

## 2019-10-20 DIAGNOSIS — Z79899 Other long term (current) drug therapy: Secondary | ICD-10-CM | POA: Diagnosis not present

## 2019-10-20 DIAGNOSIS — R110 Nausea: Secondary | ICD-10-CM | POA: Insufficient documentation

## 2019-10-20 DIAGNOSIS — R42 Dizziness and giddiness: Secondary | ICD-10-CM | POA: Insufficient documentation

## 2019-10-20 DIAGNOSIS — Z7951 Long term (current) use of inhaled steroids: Secondary | ICD-10-CM | POA: Diagnosis not present

## 2019-10-20 DIAGNOSIS — Z95828 Presence of other vascular implants and grafts: Secondary | ICD-10-CM

## 2019-10-20 LAB — CBC WITH DIFFERENTIAL/PLATELET
Abs Immature Granulocytes: 0.06 10*3/uL (ref 0.00–0.07)
Basophils Absolute: 0 10*3/uL (ref 0.0–0.1)
Basophils Relative: 0 %
Eosinophils Absolute: 0.1 10*3/uL (ref 0.0–0.5)
Eosinophils Relative: 1 %
HCT: 30.2 % — ABNORMAL LOW (ref 36.0–46.0)
Hemoglobin: 8.8 g/dL — ABNORMAL LOW (ref 12.0–15.0)
Immature Granulocytes: 1 %
Lymphocytes Relative: 28 %
Lymphs Abs: 2 10*3/uL (ref 0.7–4.0)
MCH: 26 pg (ref 26.0–34.0)
MCHC: 29.1 g/dL — ABNORMAL LOW (ref 30.0–36.0)
MCV: 89.3 fL (ref 80.0–100.0)
Monocytes Absolute: 0.6 10*3/uL (ref 0.1–1.0)
Monocytes Relative: 9 %
Neutro Abs: 4.5 10*3/uL (ref 1.7–7.7)
Neutrophils Relative %: 61 %
Platelets: 328 10*3/uL (ref 150–400)
RBC: 3.38 MIL/uL — ABNORMAL LOW (ref 3.87–5.11)
RDW: 17.4 % — ABNORMAL HIGH (ref 11.5–15.5)
WBC: 7.3 10*3/uL (ref 4.0–10.5)
nRBC: 0 % (ref 0.0–0.2)

## 2019-10-20 LAB — CMP (CANCER CENTER ONLY)
ALT: 17 U/L (ref 0–44)
AST: 28 U/L (ref 15–41)
Albumin: 2.7 g/dL — ABNORMAL LOW (ref 3.5–5.0)
Alkaline Phosphatase: 99 U/L (ref 38–126)
Anion gap: 9 (ref 5–15)
BUN: 14 mg/dL (ref 6–20)
CO2: 25 mmol/L (ref 22–32)
Calcium: 8.9 mg/dL (ref 8.9–10.3)
Chloride: 106 mmol/L (ref 98–111)
Creatinine: 0.88 mg/dL (ref 0.44–1.00)
GFR, Est AFR Am: >60 mL/min (ref >60)
GFR, Estimated: >60 mL/min (ref >60)
Glucose, Bld: 96 mg/dL (ref 70–99)
Potassium: 3.9 mmol/L (ref 3.5–5.1)
Sodium: 140 mmol/L (ref 135–145)
Total Bilirubin: 0.5 mg/dL (ref 0.3–1.2)
Total Protein: 7.2 g/dL (ref 6.5–8.1)

## 2019-10-20 LAB — FERRITIN: Ferritin: 478 ng/mL — ABNORMAL HIGH (ref 11–307)

## 2019-10-20 LAB — IRON AND TIBC
Iron: 26 ug/dL — ABNORMAL LOW (ref 41–142)
Saturation Ratios: 14 % — ABNORMAL LOW (ref 21–57)
TIBC: 184 ug/dL — ABNORMAL LOW (ref 236–444)
UIBC: 158 ug/dL (ref 120–384)

## 2019-10-20 LAB — CEA (IN HOUSE-CHCC): CEA (CHCC-In House): 15.06 ng/mL — ABNORMAL HIGH (ref 0.00–5.00)

## 2019-10-20 MED ORDER — ATROPINE SULFATE 0.4 MG/ML IJ SOLN
0.4000 mg | Freq: Once | INTRAMUSCULAR | Status: AC
  Administered 2019-10-20: 0.4 mg via INTRAVENOUS

## 2019-10-20 MED ORDER — ATROPINE SULFATE 1 MG/ML IJ SOLN: 0.4000 mg | Freq: Once | INTRAMUSCULAR | Status: DC

## 2019-10-20 MED ORDER — SODIUM CHLORIDE 0.9% FLUSH
10.0000 mL | Freq: Once | INTRAVENOUS | Status: AC
  Administered 2019-10-20: 10 mL
  Filled 2019-10-20: qty 10

## 2019-10-20 MED ORDER — DEXAMETHASONE SODIUM PHOSPHATE 10 MG/ML IJ SOLN
10.0000 mg | Freq: Once | INTRAMUSCULAR | Status: AC
  Administered 2019-10-20: 13:00:00 10 mg via INTRAVENOUS

## 2019-10-20 MED ORDER — SODIUM CHLORIDE 0.9 % IV SOLN
2400.0000 mg/m2 | INTRAVENOUS | Status: DC
  Administered 2019-10-20: 7200 mg via INTRAVENOUS
  Filled 2019-10-20: qty 144

## 2019-10-20 MED ORDER — ATROPINE SULFATE 1 MG/ML IJ SOLN
INTRAMUSCULAR | Status: AC
  Filled 2019-10-20: qty 1

## 2019-10-20 MED ORDER — LEUCOVORIN CALCIUM INJECTION 350 MG
400.0000 mg/m2 | Freq: Once | INTRAVENOUS | Status: AC
  Administered 2019-10-20: 15:00:00 1200 mg via INTRAVENOUS
  Filled 2019-10-20: qty 60

## 2019-10-20 MED ORDER — SODIUM CHLORIDE 0.9 % IV SOLN
Freq: Once | INTRAVENOUS | Status: AC
  Filled 2019-10-20: qty 250

## 2019-10-20 MED ORDER — ATROPINE SULFATE 1 MG/ML IJ SOLN
0.4000 mg | Freq: Once | INTRAMUSCULAR | Status: AC
  Administered 2019-10-20: 17:00:00 0.4 mg via INTRAVENOUS

## 2019-10-20 MED ORDER — ATROPINE SULFATE 1 MG/ML IJ SOLN: 0.4000 mg | Freq: Once | INTRAMUSCULAR | Status: DC | PRN

## 2019-10-20 MED ORDER — DEXAMETHASONE SODIUM PHOSPHATE 10 MG/ML IJ SOLN
INTRAMUSCULAR | Status: AC
  Filled 2019-10-20: qty 1

## 2019-10-20 MED ORDER — FAMOTIDINE IN NACL 20-0.9 MG/50ML-% IV SOLN
INTRAVENOUS | Status: AC
  Filled 2019-10-20: qty 50

## 2019-10-20 MED ORDER — ATROPINE SULFATE 1 MG/ML IJ SOLN: 0.5000 mg | Freq: Once | INTRAMUSCULAR | Status: DC | PRN

## 2019-10-20 MED ORDER — ATROPINE SULFATE 0.4 MG/ML IJ SOLN
INTRAMUSCULAR | Status: AC
  Filled 2019-10-20: qty 1

## 2019-10-20 MED ORDER — PALONOSETRON HCL INJECTION 0.25 MG/5ML
INTRAVENOUS | Status: AC
  Filled 2019-10-20: qty 5

## 2019-10-20 MED ORDER — IRINOTECAN HCL CHEMO INJECTION 100 MG/5ML
180.0000 mg/m2 | Freq: Once | INTRAVENOUS | Status: AC
  Administered 2019-10-20: 15:00:00 540 mg via INTRAVENOUS
  Filled 2019-10-20: qty 27

## 2019-10-20 MED ORDER — FAMOTIDINE IN NACL 20-0.9 MG/50ML-% IV SOLN
20.0000 mg | Freq: Once | INTRAVENOUS | Status: AC
  Administered 2019-10-20: 20 mg via INTRAVENOUS

## 2019-10-20 MED ORDER — SODIUM CHLORIDE 0.9% FLUSH
10.0000 mL | INTRAVENOUS | Status: DC | PRN
  Filled 2019-10-20: qty 10

## 2019-10-20 MED ORDER — PALONOSETRON HCL INJECTION 0.25 MG/5ML
0.2500 mg | Freq: Once | INTRAVENOUS | Status: AC
  Administered 2019-10-20: 13:00:00 0.25 mg via INTRAVENOUS

## 2019-10-20 NOTE — Progress Notes (Signed)
1630 - Pt C/O abdominal cramping, "feels like menstrual cramping."  Atropine 0.4 mg given before irinotecan infusion started.  Per M. Lynnell Catalan RPH ok to give another dose of atropine 0.4 mg.  Give as charted on MAR.  1653 - Pt's 5FU pump applied, pt's 46 hour infusion will not be finished by 1400 on Saturday. Dr. Irene Limbo out of office, will be notified tomorrow.  Shelia Media PA informed, he suggests increasing pump rate so infusion will be completed.  Explained to pt, pt does not want the pump rate increased, she states this was done before & she was sick.  Pump infusion remains at 5.4, pt aware.

## 2019-10-20 NOTE — Progress Notes (Signed)
HEMATOLOGY/ONCOLOGY CLINIC NOTE  Date of Service: 10/20/2019  Patient Care Team: Nolene Ebbs, MD as PCP - General (Internal Medicine) Jackelyn Knife, MD as Rounding Team (Internal Medicine)   CHIEF COMPLAINTS/PURPOSE OF CONSULTATION:   Continued mx of  Metastatic Adenocarcinoma of colon   HISTORY OF PRESENTING ILLNESS:  Margaret [name] is a wonderful 49 y.o. female who has been referred to [place] by Dr. Nolene Ebbs for evaluation and management of Iron deficiency anemia. The pt reports that she is doing well overall.   The pt recently presented to the ED on 04/16/18 for right sided abdominal pain that was evaluated with a CT A/P which revealed liver and pancreatic cyst, recommended for outpatient MRI follow up. The pt was found to have a UTI, was treated with Rocephin and discharged with Keflex. Prior to this the pt was admitted on 04/09/18 for symptomatic anemia, presenting with HGB at 6.2, received 2 units PRBCs, one IV Iron infusion, and was encouraged to seek out GI as outpatient.  She has an appointment with [facility] on 05/19/18.   The pt reports that she is still feeling dizzy and light headed and denies feeling better after her recent blood transfusion. She notes that she dizziness presents with a feeling of warmness and that she begins hyperventilating, feeling nervous about her dizziness.  She notes that prior to her recent hospital stay, she never required a blood transfusion or IV iron replacement. She notes that as a child she had frequent nose bleeds, and was diagnosed with anemia. She also notes a history of ice picca.   She had a hysterectomy in 2012, and prior to this she had very heavy periods that began at age 62 and occurred almost constantly. She was on Depo and Mirena which did not help slow menstrual losses. Besides taking iron supplements while pregnant she has not taken PO iron replacement as she reports not tolerating it very well while pregnant.   She denies  concern for black stools or blood in the stools and has not had a colonoscopy or endoscopy before. The pt notes that she has not previously had a concern for stomach ulcers, but has acid reflux and takes Prevacid as needed for 7-8 years. She notes that she has not recently used Prevacid, for the last 3 months.  She is not taking vitamin replacements and denies any dietary restrictions.  The pt notes that she has been continuing to have lower right quadrant abdominal pain that radiates across her abdomen, presents for up to 45 minutes and occurs intermittently. She notes that her abdominal pain presented on 04/13/18.   The pt notes that was taking Ibuprofen 874m BID for 8 years to address her back pain related to her herniated disk. She stopped taking this 4 months ago.   Of note prior to the patient's visit today, pt has had CT A/P completed on 04/16/18 with results revealing No acute findings in the abdomen/pelvis. Least 4 low-density hepatic lesions, some of which may represent small cysts or hemangiomas, however there is a 19 mm lesion in the left lobe of the liver that is incompletely characterized. Recommend nonemergent hepatic protocol MRI for further evaluation..Marland KitchenPossible 11 mm pancreatic nodule rising from the tail, alternatively this may represent a splenule. This can also be assessed at time of hepatic MRI. Gallstone without gallbladder inflammation. Colonic diverticulosis without diverticulitis.   Most recent lab results (04/16/18) of CBC is as follows: all values are WNL except for HGB at  9.5, HCT at 33.6, MCH at 23.8, MCHC at 28.3, RDW at 23.2, PLT at 435k. Ferritin 04/09/18 was low at 6 Vitamin B12 on 04/09/18 was at 196  On review of systems, pt reports light headedness, dizziness, lower back pain, intermittent abdominal pain, and denies black stools, blood in the stools, tingling or numbness in her legs or arms, vaginal bleeding, and any other symptoms.   INTERVAL HISTORY:   Margaret [name] returns today for management, evaluation of her newly diagnosed Metastatic Adenocarcinoma of GI primary. She is here for delayed C3D1 of FOLFIRI. The patient's last visit with [place] was on 10/06/2019. The pt reports that she is doing well overall.  The pt reports she has been experiencing off and on nausea.   Today her nausea is is worse.   CT Chest/Abdomin on 10/27/2019  Lab results today (10/20/19) of CBC w/diff and CMP is as follows: all values are WNL except for RBC at 3.38, Hemoglobin t 8.8, HCT at 30.2, MCHC at 29.1, RDW at 17.4, Albumin at 2.7, PENDING  CEA, Ferritin, and Iron/TIBC.  On review of systems, pt reports nausea, lightheadedness when she stands up, feeling more coldness and denies blood in stool, black stools, abdominal pain, mouth sores, problems with port a cath, neck swelling, leg swelling and any other symptoms.    MEDICAL HISTORY:  Past Medical History:  Diagnosis Date  . Allergic rhinitis   . Anxiety diagnosed in 1990  . Arthritis   . Asthma   . colon ca with liver mets dx'd 08/2018  . Dyspnea    with low blood count hx of anemia  . Eczema   . History of blood transfusion   . Hypertension   . Lower back pain   . Migraine   . Sleep apnea    uses CPAP    SURGICAL HISTORY: Past Surgical History:  Procedure Laterality Date  . ABDOMINAL HYSTERECTOMY    . cortisone injections     knees bilat and back   . IR IMAGING GUIDED PORT INSERTION  09/14/2018  . iron infusion    . LIVER BIOPSY      SOCIAL HISTORY: Social History   Socioeconomic History  . Marital status: Single    Spouse name: Not on file  . Number of children: Not on file  . Years of education: Not on file  . Highest education level: Not on file  Occupational History  . Not on file  Tobacco Use  . Smoking status: Former Smoker    Quit date: 07/30/1999    Years since quitting: 20.2  . Smokeless tobacco: Never Used  Substance and Sexual Activity  . Alcohol use: No  . Drug use:  No  . Sexual activity: Not on file  Other Topics Concern  . Not on file  Social History Narrative  . Not on file   Social Determinants of Health   Financial Resource Strain:   . Difficulty of Paying Living Expenses: Not on file  Food Insecurity:   . Worried About Charity fundraiser in the Last Year: Not on file  . Ran Out of Food in the Last Year: Not on file  Transportation Needs:   . Lack of Transportation (Medical): Not on file  . Lack of Transportation (Non-Medical): Not on file  Physical Activity:   . Days of Exercise per Week: Not on file  . Minutes of Exercise per Session: Not on file  Stress:   . Feeling of Stress : Not  on file  Social Connections:   . Frequency of Communication with Friends and Family: Not on file  . Frequency of Social Gatherings with Friends and Family: Not on file  . Attends Religious Services: Not on file  . Active Member of Clubs or Organizations: Not on file  . Attends Archivist Meetings: Not on file  . Marital Status: Not on file  Intimate Partner Violence:   . Fear of Current or Ex-Partner: Not on file  . Emotionally Abused: Not on file  . Physically Abused: Not on file  . Sexually Abused: Not on file    FAMILY HISTORY: Family History  Problem Relation Age of Onset  . Diabetes Father     ALLERGIES:  is allergic to latex; tape; and tylenol with codeine #3 [acetaminophen-codeine].  MEDICATIONS:  Current Outpatient Medications  Medication Sig Dispense Refill  . albuterol (PROAIR HFA) 108 (90 Base) MCG/ACT inhaler INHALE TWO puffs BY MOUTH into the lungs EVERY 4 HOURS AS NEEDED FOR WHEEZING OR shortness of breath 18 g 1  . Boric Acid 4 % SOLN Place 1 capsule vaginally every Friday.     . budesonide-formoterol (SYMBICORT) 160-4.5 MCG/ACT inhaler Inhale 2 puffs into the lungs 2 (two) times a day. 10.2 g 5  . cetirizine (ZYRTEC) 10 MG tablet Take 1 tablet (10 mg total) by mouth daily. 30 tablet 5  . clobetasol ointment  (TEMOVATE) 9.47 % Apply 1 application topically 2 (two) times daily. Use for Lichen Sclerosis    . Cyanocobalamin (B-12) 1000 MCG SUBL Place 1,000 mcg under the tongue daily. 30 each 3  . dexamethasone (DECADRON) 4 MG tablet TAKE 2 TABLETS (8 MG TOTAL) BY MOUTH DAILY  START THE DAY AFTER CHEMOTHERAPY FOR 2 DAYS. TAKE WITH FOOD. 30 tablet 1  . diazepam (VALIUM) 5 MG tablet Take 1 tablet (5 mg total) by mouth daily as needed for anxiety (panic attacks). 30 tablet 0  . diclofenac sodium (VOLTAREN) 1 % GEL Apply 2 g topically 4 (four) times daily as needed (for back/knee pain.).     Marland Kitchen dicyclomine (BENTYL) 20 MG tablet Take 1 tablet (20 mg total) by mouth 2 (two) times daily. (Patient taking differently: Take 20 mg by mouth 3 (three) times daily before meals. ) 20 tablet 0  . dronabinol (MARINOL) 5 MG capsule Take 1 capsule (5 mg total) by mouth 2 (two) times daily before a meal. 60 capsule 0  . ergocalciferol (VITAMIN D2) 50000 UNITS capsule Take 50,000 Units by mouth every Monday.     . lidocaine-prilocaine (EMLA) cream Apply to affected area once 30 g 3  . lisinopril-hydrochlorothiazide (PRINZIDE,ZESTORETIC) 20-12.5 MG tablet Take 1 tablet by mouth daily.    . methocarbamol (ROBAXIN) 750 MG tablet Take 750 mg by mouth every 8 (eight) hours as needed (back spasms.).     Marland Kitchen montelukast (SINGULAIR) 10 MG tablet Take 1 tablet (10 mg total) by mouth at bedtime. 30 tablet 6  . nystatin-triamcinolone (MYCOLOG II) cream Apply 1 application topically 2 (two) times daily as needed (irritation). Use in skin folds and under breast     . Olopatadine HCl (PAZEO) 0.7 % SOLN Apply 1 drop to eye daily. 2.5 mL 5  . ondansetron (ZOFRAN) 8 MG tablet Take 1 tablet (8 mg total) by mouth 2 (two) times daily as needed for refractory nausea / vomiting. Start on day 3 after chemotherapy. 30 tablet 0  . pantoprazole (PROTONIX) 40 MG tablet Take 1 tablet (40 mg total) by mouth  daily before breakfast. 30 tablet 1  . pimecrolimus  (ELIDEL) 1 % cream Apply 1 application topically 2 (two) times daily. 30 g 6  . prochlorperazine (COMPAZINE) 10 MG tablet Take 1 tablet (10 mg total) by mouth every 6 (six) hours as needed (Nausea or vomiting). 30 tablet 1  . saccharomyces boulardii (FLORASTOR) 250 MG capsule Take 1 capsule (250 mg total) by mouth 2 (two) times daily.    . traMADol (ULTRAM) 50 MG tablet Take 1-2 tablets (50-100 mg total) by mouth every 6 (six) hours as needed for moderate pain or severe pain. 90 tablet 0  . triamcinolone (NASACORT) 55 MCG/ACT AERO nasal inhaler Place 2 sprays into the nose daily as needed (for allergies). 1 Inhaler 5  . valACYclovir (VALTREX) 500 MG tablet Take 500 mg by mouth 2 (two) times daily as needed (for cold sores).      No current facility-administered medications for this visit.    Facility-Administered Medications Ordered in Other Visits  Medication Dose Route Frequency Provider Last Rate Last Dose  . fluorouracil (ADRUCIL) 6,950 mg in sodium chloride 0.9 % 111 mL chemo infusion  2,400 mg/m2 (Treatment Plan Recorded) Intravenous 1 day or 1 dose Brunetta Genera, MD      . leucovorin 1,156 mg in dextrose 5 % 250 mL infusion  400 mg/m2 (Treatment Plan Recorded) Intravenous Once Brunetta Genera, MD 77 mL/hr at 05/24/19 1242 1,156 mg at 05/24/19 1242  . oxaliplatin (ELOXATIN) 250 mg in dextrose 5 % 500 mL chemo infusion  250 mg Intravenous Once Brunetta Genera, MD 138 mL/hr at 05/24/19 1245 250 mg at 05/24/19 1245    REVIEW OF SYSTEMS:   A 10+ POINT REVIEW OF SYSTEMS WAS OBTAINED including neurology, dermatology, psychiatry, cardiac, respiratory, lymph, extremities, GI, GU, Musculoskeletal, constitutional, breasts, reproductive, HEENT.  All pertinent positives are noted in the HPI.  All others are negative.     PHYSICAL EXAMINATION:  ECOG FS:2 - Symptomatic, <50% confined to bed  Vitals:   10/20/19 1043  BP: (!) 151/96  Pulse: (!) 108  Resp: 18  Temp: 98 F (36.7 C)   SpO2: 99%   Wt Readings from Last 3 Encounters:  10/20/19 (!) 402 lb 12.8 oz (182.7 kg)  10/06/19 (!) 400 lb 12.8 oz (181.8 kg)  09/12/19 (!) 416 lb (188.7 kg)   Body mass index is 61.25 kg/m.    GENERAL:alert, in no acute distress and comfortable SKIN: no acute rashes, no significant lesions EYES: conjunctiva are pink and non-injected, sclera anicteric OROPHARYNX: MMM, no exudates, no oropharyngeal erythema or ulceration NECK: supple, no JVD LYMPH:  no palpable lymphadenopathy in the cervical, axillary or inguinal regions LUNGS: clear to auscultation b/l with normal respiratory effort HEART: regular rate & rhythm ABDOMEN:  normoactive bowel sounds , non tender, not distended. Extremity: no pedal edema PSYCH: alert & oriented x 3 with fluent speech NEURO: no focal motor/sensory deficits    LABORATORY DATA:  I have reviewed the data as listed  . CBC Latest Ref Rng & Units 10/20/2019 10/06/2019 09/12/2019  WBC 4.0 - 10.5 K/uL 7.3 8.6 3.3(L)  Hemoglobin 12.0 - 15.0 g/dL 8.8(L) 9.6(L) 11.0(L)  Hematocrit 36.0 - 46.0 % 30.2(L) 32.5(L) 37.8  Platelets 150 - 400 K/uL 328 337 196   ANC 900 . CMP Latest Ref Rng & Units 10/20/2019 10/06/2019 09/12/2019  Glucose 70 - 99 mg/dL 96 90 97  BUN 6 - 20 mg/dL _0 Creatinine 0.44 - 1.00 mg/dL 0.88  0.78 0.98  Sodium 135 - 145 mmol/L 140 142 141  Potassium 3.5 - 5.1 mmol/L 3.9 3.9 3.8  Chloride 98 - 111 mmol/L 106 109 108  CO2 22 - 32 mmol/L _0 Calcium 8.9 - 10.3 mg/dL 8.9 8.5(L) 8.5(L)  Total Protein 6.5 - 8.1 g/dL 7.2 7.3 7.4  Total Bilirubin 0.3 - 1.2 mg/dL 0.5 0.4 0.4  Alkaline Phos 38 - 126 U/L 99 90 88  AST 15 - 41 U/L 28 41 53(H)  ALT 0 - 44 U/L _1 . Lab Results  Component Value Date   IRON 53 07/19/2019   TIBC 233 (L) 07/19/2019   IRONPCTSAT 23 07/19/2019   (Iron and TIBC)  Lab Results  Component Value Date   FERRITIN 322 (H) 07/19/2019   B12  - 196--> 584  07/22/18 Liver Biopsy:   08/27/18  Repeat Liver Biopsy:   Molecular Pathology:     RADIOGRAPHIC STUDIES: I have personally reviewed the radiological images as listed and agreed with the findings in the report. No results found.  ASSESSMENT & PLAN:   49 y.o. female with  1. Iron Deficiency Anemia - ? Previous heavy periods vs GI losses  Recent heavy NSAID use ? Ulcer  2. B12 deficiency Labs upon initial presentation from 04/16/18, HGB at 9.5. The 04/09/18 Ferritin was low at 6 and Vitamin B12 was at 196 -No antiparietal antibodies, no intrinsic factor antibodies, normal TSH all from 04/28/18  PLAN: -Continue NSAID avoidance  -continue 103mg Vitamin B12 sublingually daily - B12 improved from 196 to 584  3.MetastaticAdenocarcinoma of cecum with liver mets. KRAS mutated.  04/16/18 CT A/P with pt revealed No acute findings in the abdomen/pelvis. Least 4 low-density hepatic lesions, some of which may represent small cysts or hemangiomas, however there is a 19 mm lesion in the left lobe of the liver that is incompletely characterized. Recommend nonemergent hepatic protocol MRI for further evaluation..Marland KitchenPossible 11 mm pancreatic nodule rising from the tail, alternatively this may represent a splenule. This can also be assessed at time of hepatic MRI. Gallstone without gallbladder inflammation. Colonic diverticulosis without diverticulitis.   07/09/18 Tumor marker work up: CEA normal at 1.32, AFP normal at 1.5, LDH normal at 138, CA 125 normal at 5.9, CA 19-9 normal at <2   07/22/18 Liver biopsy results revealed benign hepatic tissue, and was not diagnostic   07/16/18 PET/CT revealedThere is a solid hypermetabolic mass involving the cecum, worrisome for colonic primary neoplasm. Correlation withcolonoscopy Findings. 2. Multiple hypermetabolic liver lesions compatible with metastaticdisease. 3. Large low-density lesion in right lobe of thyroid gland. Advise further evaluation with thyroid sonography. 4. Aortic  Atherosclerosis. 5. Small hiatal hernia.   08/27/18 Liver biopsy revealed Metastatic adenocarcinoma, consistent with gastrointestinal primary 08/27/18 Molecular pathology- KRAS mutation positive   09/03/18 ECHO revealed an LV EF of 693-79% mild diastolic dysfunction; mild RVE; dilated PA; mild TR; moderate to severe pulmonary hypertension  09/17/18 CEA at 2.39, which was the last CEA pre-treatment value   12/13/18 CT A/P revealed Interval response to therapy. There is been decrease in size of multifocal liver metastasis. No new or progressive findings. 2. Hiatal hernia.   Did postpone C6 by one week due to concern for mild infection.   Did postpone C7 by two weeks for infection  06/15/2019 CT C/A/P ([id number] ([id number] revealed "1. Significant interval enlargement of ill-defined, hypodense masses of the liver, the largest index mass of the inferior right lobe, segment VI, measuring  4.1 x 3.7 cm, previously 1.0 cm when measured similarly (series 2, image 58). There are additional lesions in caudate (series 2, image 54), lateral right lobe (series 2, image 52) and liver dome (series 2, image 39), all substantially enlarged compared to prior examination. Findings are consistent with worsened metastatic disease. 2. Redemonstrated residual mass at the cecal base appears thickened in comparison to prior examination, approximately 1.2 cm, previously 5 mm when measured similarly (series 4, image 61, series 2, image 110). 3. Stable 2 mm nodules of the left lung base (series 6, image 105, 116), likely benign and incidental. No definite evidence of metastatic disease in the chest. Attention on follow-up."1. Significant interval enlargement of ill-defined, hypodense masses of the liver, the largest index mass of the inferior right lobe, segment VI, measuring 4.1 x 3.7 cm, previously 1.0 cm when measured similarly (series 2, image 58). There are additional lesions in caudate (series 2, image 54), lateral right  lobe (series 2, image 52) and liver dome (series 2, image 39), all substantially enlarged compared to prior examination. Findings are consistent with worsened metastatic disease. 2. Redemonstrated residual mass at the cecal base appears thickened in comparison to prior examination, approximately 1.2 cm, previously 5 mm when measured similarly (series 4, image 61, series 2, image 110). 3. Stable 2 mm nodules of the left lung base (series 6, image 105, 116), likely benign and incidental. No definite evidence of metastatic disease in the chest. Attention on follow-up."  PLAN: -Discussed pt labwork today, 10/20/19; all values are WNL except for RBC at 3.38, Hemoglobin t 8.8, HCT at 30.2, MCHC at 29.1, RDW at 17.4, Albumin at 2.7, PENDING  CEA, Ferritin, and Iron/TIBC. -Discussed CT scans scheduled for 10/27/2019 -no prohibitive toxicities - proceed with next cycle of FOLFIRI  FOLLOW UP: F/u with portflush, labs and MD visit with next cycle of rx    The total time spent in the appt was 25 minutes and more than 50% was on counseling and direct patient cares.  All of the patient's questions were answered with apparent satisfaction. The patient knows to call the clinic with any problems, questions or concerns.     Sullivan Lone MD MS AAHIVMS [facility] Hematology/Oncology Physician [facility]  (Office):       [phone] (Work cell):  [phone] (Fax):           [phone]  10/20/2019 9:37 AM   I, Scot Dock, am acting as a scribe for Dr. Sullivan Lone.   .I have reviewed the above documentation for accuracy and completeness, and I agree with the above. Brunetta Genera MD

## 2019-10-21 ENCOUNTER — Telehealth: Payer: Self-pay | Admitting: Hematology

## 2019-10-21 NOTE — Telephone Encounter (Signed)
No new los during check out. 

## 2019-10-22 ENCOUNTER — Telehealth: Payer: Self-pay | Admitting: *Deleted

## 2019-10-22 ENCOUNTER — Other Ambulatory Visit: Payer: Self-pay

## 2019-10-22 ENCOUNTER — Inpatient Hospital Stay: Payer: Medicaid Other

## 2019-10-22 VITALS — BP 145/81 | HR 98 | Temp 98.3°F | Resp 22

## 2019-10-22 DIAGNOSIS — Z5111 Encounter for antineoplastic chemotherapy: Secondary | ICD-10-CM | POA: Diagnosis not present

## 2019-10-22 MED ORDER — HEPARIN SOD (PORK) LOCK FLUSH 100 UNIT/ML IV SOLN
500.0000 [IU] | Freq: Once | INTRAVENOUS | Status: AC | PRN
  Administered 2019-10-22: 500 [IU]
  Filled 2019-10-22: qty 5

## 2019-10-22 MED ORDER — SODIUM CHLORIDE 0.9% FLUSH
10.0000 mL | INTRAVENOUS | Status: DC | PRN
  Administered 2019-10-22: 10 mL
  Filled 2019-10-22: qty 10

## 2019-10-22 MED ORDER — PEGFILGRASTIM-CBQV 6 MG/0.6ML ~~LOC~~ SOSY
6.0000 mg | PREFILLED_SYRINGE | Freq: Once | SUBCUTANEOUS | Status: AC
  Administered 2019-10-22: 14:00:00 6 mg via SUBCUTANEOUS

## 2019-10-22 NOTE — Patient Instructions (Signed)

## 2019-10-22 NOTE — Telephone Encounter (Signed)
Late entry from 10/21/19 - spoke with Dr. Grier Mitts nurse, Inocencio Homes RN, regarding this patient's pump infusion will not be complete at her pump stop appointment on Saturday 10/22/19.  RN informed that patient declined having her infusion rate increased.  RN states she informed Dr. Irene Limbo and that he is ok with pump being DC'd without patient completing infusion.

## 2019-10-23 ENCOUNTER — Encounter: Payer: Self-pay | Admitting: Hematology

## 2019-10-24 ENCOUNTER — Encounter: Payer: Self-pay | Admitting: *Deleted

## 2019-10-26 ENCOUNTER — Telehealth: Payer: Self-pay | Admitting: *Deleted

## 2019-10-26 NOTE — Telephone Encounter (Signed)
Contacted patient in response to My chart question about CEA level with Dr. Grier Mitts response: It is higher and concerning for cancer progression -- awaiting CT -scheduled tomorrow. He will review CT results. Patient verbalized understanding.

## 2019-10-27 ENCOUNTER — Encounter (HOSPITAL_COMMUNITY): Payer: Self-pay

## 2019-10-27 ENCOUNTER — Other Ambulatory Visit: Payer: Self-pay | Admitting: *Deleted

## 2019-10-27 ENCOUNTER — Encounter: Payer: Self-pay | Admitting: Hematology

## 2019-10-27 ENCOUNTER — Ambulatory Visit (HOSPITAL_COMMUNITY)
Admission: RE | Admit: 2019-10-27 | Discharge: 2019-10-27 | Disposition: A | Discharge status: Home / Self Care | Payer: Medicaid Other | Source: Ambulatory Visit | Attending: Hematology | Admitting: Hematology

## 2019-10-27 ENCOUNTER — Other Ambulatory Visit: Payer: Self-pay

## 2019-10-27 DIAGNOSIS — C787 Secondary malignant neoplasm of liver and intrahepatic bile duct: Secondary | ICD-10-CM | POA: Insufficient documentation

## 2019-10-27 DIAGNOSIS — C189 Malignant neoplasm of colon, unspecified: Secondary | ICD-10-CM | POA: Insufficient documentation

## 2019-10-27 DIAGNOSIS — Z5111 Encounter for antineoplastic chemotherapy: Secondary | ICD-10-CM | POA: Diagnosis present

## 2019-10-27 MED ORDER — HEPARIN SOD (PORK) LOCK FLUSH 100 UNIT/ML IV SOLN
500.0000 [IU] | Freq: Once | INTRAVENOUS | Status: AC
  Administered 2019-10-27: 500 [IU] via INTRAVENOUS

## 2019-10-27 MED ORDER — IOHEXOL 9 MG/ML PO SOLN: 500.0000 mL | ORAL | Status: AC

## 2019-10-27 MED ORDER — IOHEXOL 9 MG/ML PO SOLN
ORAL | Status: AC
  Administered 2019-10-27: 1000 mL via ORAL
  Filled 2019-10-27: qty 500

## 2019-10-27 MED ORDER — SENNOSIDES-DOCUSATE SODIUM 8.6-50 MG PO TABS: 2.0000 | ORAL_TABLET | Freq: Every day | ORAL | 2 refills | Status: AC

## 2019-10-27 MED ORDER — SODIUM CHLORIDE (PF) 0.9 % IJ SOLN
INTRAMUSCULAR | Status: AC
  Filled 2019-10-27: qty 50

## 2019-10-27 MED ORDER — IOHEXOL 300 MG/ML  SOLN
100.0000 mL | Freq: Once | INTRAMUSCULAR | Status: AC | PRN
  Administered 2019-10-27: 100 mL via INTRAVENOUS

## 2019-10-27 MED ORDER — HEPARIN SOD (PORK) LOCK FLUSH 100 UNIT/ML IV SOLN
INTRAVENOUS | Status: AC
  Filled 2019-10-27: qty 5

## 2019-11-02 ENCOUNTER — Telehealth: Payer: Self-pay | Admitting: *Deleted

## 2019-11-02 ENCOUNTER — Encounter: Payer: Self-pay | Admitting: Hematology

## 2019-11-02 NOTE — Telephone Encounter (Signed)
Patient's appts (lab/MD/infusion) scheduled on 2/18 cancelled by CC due to inclement weather. Patient contacted and verbalized understanding. Schedule message sent to reschedule appointments. Patient states earliest she can come next week is on Wednesday 2/24 at 9 am at earliest.

## 2019-11-03 ENCOUNTER — Inpatient Hospital Stay: Payer: Medicaid Other

## 2019-11-03 ENCOUNTER — Inpatient Hospital Stay: Payer: Medicaid Other | Admitting: Hematology

## 2019-11-07 ENCOUNTER — Encounter: Payer: Self-pay | Admitting: Hematology

## 2019-11-07 ENCOUNTER — Telehealth: Payer: Self-pay | Admitting: Hematology

## 2019-11-07 NOTE — Telephone Encounter (Signed)
Scheduled appt per 2/17 sch message- pt aware of appts added.

## 2019-11-08 ENCOUNTER — Inpatient Hospital Stay (HOSPITAL_BASED_OUTPATIENT_CLINIC_OR_DEPARTMENT_OTHER): Payer: Medicaid Other | Admitting: Hematology

## 2019-11-08 ENCOUNTER — Encounter: Payer: Self-pay | Admitting: General Practice

## 2019-11-08 DIAGNOSIS — C787 Secondary malignant neoplasm of liver and intrahepatic bile duct: Secondary | ICD-10-CM | POA: Diagnosis not present

## 2019-11-08 DIAGNOSIS — C189 Malignant neoplasm of colon, unspecified: Secondary | ICD-10-CM | POA: Diagnosis not present

## 2019-11-08 DIAGNOSIS — Z5111 Encounter for antineoplastic chemotherapy: Secondary | ICD-10-CM

## 2019-11-08 NOTE — Progress Notes (Signed)
Margaret [name] CSW Progress Notes  Called patient at request of oncologist/S Diplomatic Services operational officer.  Patient states "I got some bad news, my tumors are growing.  I need help w making a will and finding whole life insurance that is effective immediately."  Also wants help finding counselor for 51 year old son.  Patient and family are already linked w [facility] - have had visits to discuss family plans in light of mother's serious illness.  Sibling has been identified to take in 39 year old in event of mother's death. Encouraged patient to reach out to [place] for ongoing support as they are expert in supporting families through serious illness/ death.  Patient needs help eeds to put this plan in writing and get paperwork in order.  Will research counseling referrals for son as well as options for will preparation for mother.  Will continue to reach out to patient for help/support.    Margaret Shell, LCSW Clinical Social Worker Phone:  [phone] Cell:  [phone]

## 2019-11-08 NOTE — Progress Notes (Signed)
HEMATOLOGY/ONCOLOGY CLINIC NOTE  Date of Service: 11/08/2019  Patient Care Team: Nolene Ebbs, MD as PCP - General (Internal Medicine) Jackelyn Knife, MD as Rounding Team (Internal Medicine)   CHIEF COMPLAINTS/PURPOSE OF CONSULTATION:   Continued mx of  Metastatic Adenocarcinoma of colon   HISTORY OF PRESENTING ILLNESS:  Margaret [name] is a wonderful 49 y.o. female who has been referred to [place] by Dr. Nolene Ebbs for evaluation and management of Iron deficiency anemia. The pt reports that she is doing well overall.   The pt recently presented to the ED on 04/16/18 for right sided abdominal pain that was evaluated with a CT A/P which revealed liver and pancreatic cyst, recommended for outpatient MRI follow up. The pt was found to have a UTI, was treated with Rocephin and discharged with Keflex. Prior to this the pt was admitted on 04/09/18 for symptomatic anemia, presenting with HGB at 6.2, received 2 units PRBCs, one IV Iron infusion, and was encouraged to seek out GI as outpatient.  She has an appointment with [facility] on 05/19/18.   The pt reports that she is still feeling dizzy and light headed and denies feeling better after her recent blood transfusion. She notes that she dizziness presents with a feeling of warmness and that she begins hyperventilating, feeling nervous about her dizziness.  She notes that prior to her recent hospital stay, she never required a blood transfusion or IV iron replacement. She notes that as a child she had frequent nose bleeds, and was diagnosed with anemia. She also notes a history of ice picca.   She had a hysterectomy in 2012, and prior to this she had very heavy periods that began at age 88 and occurred almost constantly. She was on Depo and Mirena which did not help slow menstrual losses. Besides taking iron supplements while pregnant she has not taken PO iron replacement as she reports not tolerating it very well while pregnant.   She denies  concern for black stools or blood in the stools and has not had a colonoscopy or endoscopy before. The pt notes that she has not previously had a concern for stomach ulcers, but has acid reflux and takes Prevacid as needed for 7-8 years. She notes that she has not recently used Prevacid, for the last 3 months.  She is not taking vitamin replacements and denies any dietary restrictions.  The pt notes that she has been continuing to have lower right quadrant abdominal pain that radiates across her abdomen, presents for up to 45 minutes and occurs intermittently. She notes that her abdominal pain presented on 04/13/18.   The pt notes that was taking Ibuprofen 853m BID for 8 years to address her back pain related to her herniated disk. She stopped taking this 4 months ago.   Of note prior to the patient's visit today, pt has had CT A/P completed on 04/16/18 with results revealing No acute findings in the abdomen/pelvis. Least 4 low-density hepatic lesions, some of which may represent small cysts or hemangiomas, however there is a 19 mm lesion in the left lobe of the liver that is incompletely characterized. Recommend nonemergent hepatic protocol MRI for further evaluation..Marland KitchenPossible 11 mm pancreatic nodule rising from the tail, alternatively this may represent a splenule. This can also be assessed at time of hepatic MRI. Gallstone without gallbladder inflammation. Colonic diverticulosis without diverticulitis.   Most recent lab results (04/16/18) of CBC is as follows: all values are WNL except for HGB at  9.5, HCT at 33.6, MCH at 23.8, MCHC at 28.3, RDW at 23.2, PLT at 435k. Ferritin 04/09/18 was low at 6 Vitamin B12 on 04/09/18 was at 196  On review of systems, pt reports light headedness, dizziness, lower back pain, intermittent abdominal pain, and denies black stools, blood in the stools, tingling or numbness in her legs or arms, vaginal bleeding, and any other symptoms.   INTERVAL HISTORY:   I connected  with  Berneice Gandy on 11/08/19 by telephone and verified that I am speaking with the correct person using two identifiers.   I discussed the limitations of evaluation and management by telemedicine. The patient expressed understanding and agreed to proceed.  Other persons participating in the visit and their role in the encounter:       -Yevette Edwards, Medical Scribe  Patient's location: Home Provider's location: [place] at [employer] returns today for management, evaluation of her newly diagnosed Metastatic Adenocarcinoma of GI primary. She is here prior to very delayed C5D1 of FOLFIRI. The patient's last visit with [place] was on 10/20/2019. The pt reports that she is doing well overall.  The pt reports that she is currently nauseous and has been vomiting 2-3 times per week. Pt is only eating once per day, because it makes her stomach hurt. Vomiting her food back up provides her more relief from nausea than her medications. Pt is currently having low right abdominal pain but has continued having bowel movements and passing gas.   Of note since the patient's last visit, pt has had CT Abd/Pel ([id number]) completed on 10/27/2019 with results revealing "1. Significant interval increase in size and bulk of an endoluminal mass of the cecal base near the ileocecal valve, measuring at least 3.1 cm in thickness, previously approximately 1.6 cm. Findings are consistent with worsened primary malignancy. 2. Marked interval increase in size of multiple bulky hypodense masses of the liver, the largest in the lateral right lobe measuring 10.1 x 6.3 cm, previously 4.0 x 3.6 cm. There are multiple additional lesions of the right lobe of the liver and new lesions in the left lobe of the liver. Findings are consistent with worsened hepatic metastatic disease. 3. Stable small pulmonary nodules, likely benign and incidental. No specific evidence of metastatic disease within the chest. Attention on  follow-up. 4. Enlargement of the main pulmonary artery up to 3.7 cm, which can be seen with pulmonary arterial hypertension."  On review of systems, pt reports nausea, vomiting, right abdominal pain and denies constipation, bloody stools and any other symptoms.   MEDICAL HISTORY:  Past Medical History:  Diagnosis Date  . Allergic rhinitis   . Anxiety diagnosed in 1990  . Arthritis   . Asthma   . colon ca with liver mets dx'd 08/2018  . Dyspnea    with low blood count hx of anemia  . Eczema   . History of blood transfusion   . Hypertension   . Lower back pain   . Migraine   . Sleep apnea    uses CPAP    SURGICAL HISTORY: Past Surgical History:  Procedure Laterality Date  . ABDOMINAL HYSTERECTOMY    . cortisone injections     knees bilat and back   . IR IMAGING GUIDED PORT INSERTION  09/14/2018  . iron infusion    . LIVER BIOPSY      SOCIAL HISTORY: Social History   Socioeconomic History  . Marital status: Single    Spouse name: Not  on file  . Number of children: Not on file  . Years of education: Not on file  . Highest education level: Not on file  Occupational History  . Not on file  Tobacco Use  . Smoking status: Former Smoker    Quit date: 07/30/1999    Years since quitting: 20.2  . Smokeless tobacco: Never Used  Substance and Sexual Activity  . Alcohol use: No  . Drug use: No  . Sexual activity: Not on file  Other Topics Concern  . Not on file  Social History Narrative  . Not on file   Social Determinants of Health   Financial Resource Strain:   . Difficulty of Paying Living Expenses: Not on file  Food Insecurity:   . Worried About Charity fundraiser in the Last Year: Not on file  . Ran Out of Food in the Last Year: Not on file  Transportation Needs:   . Lack of Transportation (Medical): Not on file  . Lack of Transportation (Non-Medical): Not on file  Physical Activity:   . Days of Exercise per Week: Not on file  . Minutes of Exercise per  Session: Not on file  Stress:   . Feeling of Stress : Not on file  Social Connections:   . Frequency of Communication with Friends and Family: Not on file  . Frequency of Social Gatherings with Friends and Family: Not on file  . Attends Religious Services: Not on file  . Active Member of Clubs or Organizations: Not on file  . Attends Archivist Meetings: Not on file  . Marital Status: Not on file  Intimate Partner Violence:   . Fear of Current or Ex-Partner: Not on file  . Emotionally Abused: Not on file  . Physically Abused: Not on file  . Sexually Abused: Not on file    FAMILY HISTORY: Family History  Problem Relation Age of Onset  . Diabetes Father     ALLERGIES:  is allergic to latex; tape; and tylenol with codeine #3 [acetaminophen-codeine].  MEDICATIONS:  Current Outpatient Medications  Medication Sig Dispense Refill  . albuterol (PROAIR HFA) 108 (90 Base) MCG/ACT inhaler INHALE TWO puffs BY MOUTH into the lungs EVERY 4 HOURS AS NEEDED FOR WHEEZING OR shortness of breath 18 g 1  . Boric Acid 4 % SOLN Place 1 capsule vaginally every Friday.     . budesonide-formoterol (SYMBICORT) 160-4.5 MCG/ACT inhaler Inhale 2 puffs into the lungs 2 (two) times a day. 10.2 g 5  . cetirizine (ZYRTEC) 10 MG tablet Take 1 tablet (10 mg total) by mouth daily. 30 tablet 5  . clobetasol ointment (TEMOVATE) 8.18 % Apply 1 application topically 2 (two) times daily. Use for Lichen Sclerosis    . Cyanocobalamin (B-12) 1000 MCG SUBL Place 1,000 mcg under the tongue daily. 30 each 3  . dexamethasone (DECADRON) 4 MG tablet TAKE 2 TABLETS (8 MG TOTAL) BY MOUTH DAILY  START THE DAY AFTER CHEMOTHERAPY FOR 2 DAYS. TAKE WITH FOOD. 30 tablet 1  . diazepam (VALIUM) 5 MG tablet Take 1 tablet (5 mg total) by mouth daily as needed for anxiety (panic attacks). 30 tablet 0  . diclofenac sodium (VOLTAREN) 1 % GEL Apply 2 g topically 4 (four) times daily as needed (for back/knee pain.).     Marland Kitchen dicyclomine  (BENTYL) 20 MG tablet Take 1 tablet (20 mg total) by mouth 2 (two) times daily. (Patient taking differently: Take 20 mg by mouth 3 (three) times daily before  meals. ) 20 tablet 0  . dronabinol (MARINOL) 5 MG capsule Take 1 capsule (5 mg total) by mouth 2 (two) times daily before a meal. 60 capsule 0  . ergocalciferol (VITAMIN D2) 50000 UNITS capsule Take 50,000 Units by mouth every Monday.     . lidocaine-prilocaine (EMLA) cream Apply to affected area once 30 g 3  . lisinopril-hydrochlorothiazide (PRINZIDE,ZESTORETIC) 20-12.5 MG tablet Take 1 tablet by mouth daily.    . methocarbamol (ROBAXIN) 750 MG tablet Take 750 mg by mouth every 8 (eight) hours as needed (back spasms.).     Marland Kitchen montelukast (SINGULAIR) 10 MG tablet Take 1 tablet (10 mg total) by mouth at bedtime. 30 tablet 6  . nystatin-triamcinolone (MYCOLOG II) cream Apply 1 application topically 2 (two) times daily as needed (irritation). Use in skin folds and under breast     . Olopatadine HCl (PAZEO) 0.7 % SOLN Apply 1 drop to eye daily. 2.5 mL 5  . ondansetron (ZOFRAN) 8 MG tablet Take 1 tablet (8 mg total) by mouth 2 (two) times daily as needed for refractory nausea / vomiting. Start on day 3 after chemotherapy. 30 tablet 0  . pantoprazole (PROTONIX) 40 MG tablet Take 1 tablet (40 mg total) by mouth daily before breakfast. 30 tablet 1  . pimecrolimus (ELIDEL) 1 % cream Apply 1 application topically 2 (two) times daily. 30 g 6  . prochlorperazine (COMPAZINE) 10 MG tablet Take 1 tablet (10 mg total) by mouth every 6 (six) hours as needed (Nausea or vomiting). 30 tablet 1  . saccharomyces boulardii (FLORASTOR) 250 MG capsule Take 1 capsule (250 mg total) by mouth 2 (two) times daily.    . traMADol (ULTRAM) 50 MG tablet Take 1-2 tablets (50-100 mg total) by mouth every 6 (six) hours as needed for moderate pain or severe pain. 90 tablet 0  . triamcinolone (NASACORT) 55 MCG/ACT AERO nasal inhaler Place 2 sprays into the nose daily as needed (for  allergies). 1 Inhaler 5  . valACYclovir (VALTREX) 500 MG tablet Take 500 mg by mouth 2 (two) times daily as needed (for cold sores).      No current facility-administered medications for this visit.    Facility-Administered Medications Ordered in Other Visits  Medication Dose Route Frequency Provider Last Rate Last Dose  . fluorouracil (ADRUCIL) 6,950 mg in sodium chloride 0.9 % 111 mL chemo infusion  2,400 mg/m2 (Treatment Plan Recorded) Intravenous 1 day or 1 dose Brunetta Genera, MD      . leucovorin 1,156 mg in dextrose 5 % 250 mL infusion  400 mg/m2 (Treatment Plan Recorded) Intravenous Once Brunetta Genera, MD 77 mL/hr at 05/24/19 1242 1,156 mg at 05/24/19 1242  . oxaliplatin (ELOXATIN) 250 mg in dextrose 5 % 500 mL chemo infusion  250 mg Intravenous Once Brunetta Genera, MD 138 mL/hr at 05/24/19 1245 250 mg at 05/24/19 1245    REVIEW OF SYSTEMS:   A 10+ POINT REVIEW OF SYSTEMS WAS OBTAINED including neurology, dermatology, psychiatry, cardiac, respiratory, lymph, extremities, GI, GU, Musculoskeletal, constitutional, breasts, reproductive, HEENT.  All pertinent positives are noted in the HPI.  All others are negative.   PHYSICAL EXAMINATION:  ECOG FS:2 - Symptomatic, <50% confined to bed  There were no vitals filed for this visit. Wt Readings from Last 3 Encounters:  10/20/19 (!) 402 lb 12.8 oz (182.7 kg)  10/06/19 (!) 400 lb 12.8 oz (181.8 kg)  09/12/19 (!) 416 lb (188.7 kg)   There is no height or weight  on file to calculate BMI.    Telehealth visit   LABORATORY DATA:  I have reviewed the data as listed  . CBC Latest Ref Rng & Units 11/09/2019 10/20/2019 10/06/2019  WBC 4.0 - 10.5 K/uL 10.2 7.3 8.6  Hemoglobin 12.0 - 15.0 g/dL 8.8(L) 8.8(L) 9.6(L)  Hematocrit 36.0 - 46.0 % 30.3(L) 30.2(L) 32.5(L)  Platelets 150 - 400 K/uL 393 328 337   ANC 900 . CMP Latest Ref Rng & Units 11/09/2019 10/20/2019 10/06/2019  Glucose 70 - 99 mg/dL 112(H) 96 90  BUN 6 - 20 mg/dL _0 Creatinine 0.44 - 1.00 mg/dL 1.04(H) 0.88 0.78  Sodium 135 - 145 mmol/L 142 140 142  Potassium 3.5 - 5.1 mmol/L 3.8 3.9 3.9  Chloride 98 - 111 mmol/L 109 106 109  CO2 22 - 32 mmol/L _1 Calcium 8.9 - 10.3 mg/dL 8.8(L) 8.9 8.5(L)  Total Protein 6.5 - 8.1 g/dL 7.7 7.2 7.3  Total Bilirubin 0.3 - 1.2 mg/dL 0.5 0.5 0.4  Alkaline Phos 38 - 126 U/L 146(H) 99 90  AST 15 - 41 U/L 34 28 41  ALT 0 - 44 U/L _2 . Lab Results  Component Value Date   IRON 26 (L) 10/20/2019   TIBC 184 (L) 10/20/2019   IRONPCTSAT 14 (L) 10/20/2019   (Iron and TIBC)  Lab Results  Component Value Date   FERRITIN 478 (H) 10/20/2019   B12  - 196--> 584  07/22/18 Liver Biopsy:   08/27/18 Repeat Liver Biopsy:   Molecular Pathology:     RADIOGRAPHIC STUDIES: I have personally reviewed the radiological images as listed and agreed with the findings in the report. CT Chest W Contrast  Result Date: 10/27/2019 CLINICAL DATA:  Metastatic colon cancer, evaluate treatment response EXAM: CT CHEST, ABDOMEN, AND PELVIS WITH CONTRAST TECHNIQUE: Multidetector CT imaging of the chest, abdomen and pelvis was performed following the standard protocol during bolus administration of intravenous contrast. CONTRAST:  154m OMNIPAQUE IOHEXOL 300 MG/ML SOLN, additional oral enteric contrast COMPARISON:  06/15/2019 FINDINGS: CT CHEST FINDINGS Cardiovascular: Right chest port catheter. Normal heart size. Enlargement of the main pulmonary artery up to 3.7 cm no pericardial effusion. Mediastinum/Nodes: No enlarged mediastinal, hilar, or axillary lymph nodes. Large, heterogeneous nodule of the right lobe of the thyroid, measuring 2.6 cm. Trachea, and esophagus demonstrate no significant findings. Lungs/Pleura: Unchanged bandlike scarring and volume loss of the right lobe lower and middle lobes. Unchanged small pulmonary nodules, including a 2 mm subpleural nodule of the left lower lobe (series 6, image 103) and a 4 mm  nodule of the medial anterior right upper lobe (series 6, image 49). No pleural effusion or pneumothorax. Musculoskeletal: No chest wall mass or suspicious bone lesions identified. CT ABDOMEN PELVIS FINDINGS Hepatobiliary: Marked interval increase in size of multiple bulky hypodense masses of the liver, the largest in the lateral right lobe measuring 10.1 x 6.3 cm, previously 4.0 x 3.6 cm (series 2, image 47). There are multiple additional lesions of the right lobe of the liver and new lesions in the left lobe of the liver. No gallstones, gallbladder wall thickening, or biliary dilatation. Pancreas: Unremarkable. No pancreatic ductal dilatation or surrounding inflammatory changes. Spleen: Normal in size without significant abnormality. Adrenals/Urinary Tract: Adrenal glands are unremarkable. Kidneys are normal, without renal calculi, solid lesion, or hydronephrosis. Bladder is unremarkable. Stomach/Bowel: Stomach is within normal limits. Appendix appears normal. Significant interval increase in thickness and bulk of an endoluminal mass  of the cecal base near the ileocecal valve, measuring at least 3.1 cm in thickness, previously approximately 1.6 cm (series 4, image 78). Descending and sigmoid diverticulosis. Vascular/Lymphatic: No significant vascular findings are present. No enlarged abdominal or pelvic lymph nodes. Reproductive: Status post hysterectomy. Other: No abdominal wall hernia or abnormality. No abdominopelvic ascites. Musculoskeletal: No acute or significant osseous findings. Unchanged benign sclerotic bone island of the right ilium (series 4, image 95). IMPRESSION: 1. Significant interval increase in size and bulk of an endoluminal mass of the cecal base near the ileocecal valve, measuring at least 3.1 cm in thickness, previously approximately 1.6 cm. Findings are consistent with worsened primary malignancy. 2. Marked interval increase in size of multiple bulky hypodense masses of the liver, the  largest in the lateral right lobe measuring 10.1 x 6.3 cm, previously 4.0 x 3.6 cm. There are multiple additional lesions of the right lobe of the liver and new lesions in the left lobe of the liver. Findings are consistent with worsened hepatic metastatic disease. 3. Stable small pulmonary nodules, likely benign and incidental. No specific evidence of metastatic disease within the chest. Attention on follow-up. 4. Enlargement of the main pulmonary artery up to 3.7 cm, which can be seen with pulmonary arterial hypertension. Electronically Signed   By: Eddie Candle M.D.   On: 10/27/2019 16:36   CT Abdomen Pelvis W Contrast  Result Date: 10/27/2019 CLINICAL DATA:  Metastatic colon cancer, evaluate treatment response EXAM: CT CHEST, ABDOMEN, AND PELVIS WITH CONTRAST TECHNIQUE: Multidetector CT imaging of the chest, abdomen and pelvis was performed following the standard protocol during bolus administration of intravenous contrast. CONTRAST:  153m OMNIPAQUE IOHEXOL 300 MG/ML SOLN, additional oral enteric contrast COMPARISON:  06/15/2019 FINDINGS: CT CHEST FINDINGS Cardiovascular: Right chest port catheter. Normal heart size. Enlargement of the main pulmonary artery up to 3.7 cm no pericardial effusion. Mediastinum/Nodes: No enlarged mediastinal, hilar, or axillary lymph nodes. Large, heterogeneous nodule of the right lobe of the thyroid, measuring 2.6 cm. Trachea, and esophagus demonstrate no significant findings. Lungs/Pleura: Unchanged bandlike scarring and volume loss of the right lobe lower and middle lobes. Unchanged small pulmonary nodules, including a 2 mm subpleural nodule of the left lower lobe (series 6, image 103) and a 4 mm nodule of the medial anterior right upper lobe (series 6, image 49). No pleural effusion or pneumothorax. Musculoskeletal: No chest wall mass or suspicious bone lesions identified. CT ABDOMEN PELVIS FINDINGS Hepatobiliary: Marked interval increase in size of multiple bulky hypodense  masses of the liver, the largest in the lateral right lobe measuring 10.1 x 6.3 cm, previously 4.0 x 3.6 cm (series 2, image 47). There are multiple additional lesions of the right lobe of the liver and new lesions in the left lobe of the liver. No gallstones, gallbladder wall thickening, or biliary dilatation. Pancreas: Unremarkable. No pancreatic ductal dilatation or surrounding inflammatory changes. Spleen: Normal in size without significant abnormality. Adrenals/Urinary Tract: Adrenal glands are unremarkable. Kidneys are normal, without renal calculi, solid lesion, or hydronephrosis. Bladder is unremarkable. Stomach/Bowel: Stomach is within normal limits. Appendix appears normal. Significant interval increase in thickness and bulk of an endoluminal mass of the cecal base near the ileocecal valve, measuring at least 3.1 cm in thickness, previously approximately 1.6 cm (series 4, image 78). Descending and sigmoid diverticulosis. Vascular/Lymphatic: No significant vascular findings are present. No enlarged abdominal or pelvic lymph nodes. Reproductive: Status post hysterectomy. Other: No abdominal wall hernia or abnormality. No abdominopelvic ascites. Musculoskeletal: No acute or significant  osseous findings. Unchanged benign sclerotic bone island of the right ilium (series 4, image 95). IMPRESSION: 1. Significant interval increase in size and bulk of an endoluminal mass of the cecal base near the ileocecal valve, measuring at least 3.1 cm in thickness, previously approximately 1.6 cm. Findings are consistent with worsened primary malignancy. 2. Marked interval increase in size of multiple bulky hypodense masses of the liver, the largest in the lateral right lobe measuring 10.1 x 6.3 cm, previously 4.0 x 3.6 cm. There are multiple additional lesions of the right lobe of the liver and new lesions in the left lobe of the liver. Findings are consistent with worsened hepatic metastatic disease. 3. Stable small  pulmonary nodules, likely benign and incidental. No specific evidence of metastatic disease within the chest. Attention on follow-up. 4. Enlargement of the main pulmonary artery up to 3.7 cm, which can be seen with pulmonary arterial hypertension. Electronically Signed   By: Eddie Candle M.D.   On: 10/27/2019 16:36    ASSESSMENT & PLAN:   49 y.o. female with  1. Iron Deficiency Anemia - ? Previous heavy periods vs GI losses  Recent heavy NSAID use ? Ulcer  2. B12 deficiency Labs upon initial presentation from 04/16/18, HGB at 9.5. The 04/09/18 Ferritin was low at 6 and Vitamin B12 was at 196 -No antiparietal antibodies, no intrinsic factor antibodies, normal TSH all from 04/28/18  PLAN: -Continue NSAID avoidance  -continue 1026mg Vitamin B12 sublingually daily - B12 improved from 196 to 584  3.MetastaticAdenocarcinoma of cecum with liver mets. KRAS mutated.  04/16/18 CT A/P with pt revealed No acute findings in the abdomen/pelvis. Least 4 low-density hepatic lesions, some of which may represent small cysts or hemangiomas, however there is a 19 mm lesion in the left lobe of the liver that is incompletely characterized. Recommend nonemergent hepatic protocol MRI for further evaluation..Marland KitchenPossible 11 mm pancreatic nodule rising from the tail, alternatively this may represent a splenule. This can also be assessed at time of hepatic MRI. Gallstone without gallbladder inflammation. Colonic diverticulosis without diverticulitis.   07/09/18 Tumor marker work up: CEA normal at 1.32, AFP normal at 1.5, LDH normal at 138, CA 125 normal at 5.9, CA 19-9 normal at <2   07/22/18 Liver biopsy results revealed benign hepatic tissue, and was not diagnostic   07/16/18 PET/CT revealedThere is a solid hypermetabolic mass involving the cecum, worrisome for colonic primary neoplasm. Correlation withcolonoscopy Findings. 2. Multiple hypermetabolic liver lesions compatible with metastaticdisease. 3. Large  low-density lesion in right lobe of thyroid gland. Advise further evaluation with thyroid sonography. 4. Aortic Atherosclerosis. 5. Small hiatal hernia.   08/27/18 Liver biopsy revealed Metastatic adenocarcinoma, consistent with gastrointestinal primary 08/27/18 Molecular pathology- KRAS mutation positive   09/03/18 ECHO revealed an LV EF of 637-10% mild diastolic dysfunction; mild RVE; dilated PA; mild TR; moderate to severe pulmonary hypertension  09/17/18 CEA at 2.39, which was the last CEA pre-treatment value   12/13/18 CT A/P revealed Interval response to therapy. There is been decrease in size of multifocal liver metastasis. No new or progressive findings. 2. Hiatal hernia.   Did postpone C6 by one week due to concern for mild infection.   Did postpone C7 by two weeks for infection  06/15/2019 CT C/A/P ([id number] ([id number] revealed "1. Significant interval enlargement of ill-defined, hypodense masses of the liver, the largest index mass of the inferior right lobe, segment VI, measuring 4.1 x 3.7 cm, previously 1.0 cm when measured similarly (series 2, image 58).  There are additional lesions in caudate (series 2, image 54), lateral right lobe (series 2, image 52) and liver dome (series 2, image 39), all substantially enlarged compared to prior examination. Findings are consistent with worsened metastatic disease. 2. Redemonstrated residual mass at the cecal base appears thickened in comparison to prior examination, approximately 1.2 cm, previously 5 mm when measured similarly (series 4, image 61, series 2, image 110). 3. Stable 2 mm nodules of the left lung base (series 6, image 105, 116), likely benign and incidental. No definite evidence of metastatic disease in the chest. Attention on follow-up."1. Significant interval enlargement of ill-defined, hypodense masses of the liver, the largest index mass of the inferior right lobe, segment VI, measuring 4.1 x 3.7 cm, previously 1.0 cm when  measured similarly (series 2, image 58). There are additional lesions in caudate (series 2, image 54), lateral right lobe (series 2, image 52) and liver dome (series 2, image 39), all substantially enlarged compared to prior examination. Findings are consistent with worsened metastatic disease. 2. Redemonstrated residual mass at the cecal base appears thickened in comparison to prior examination, approximately 1.2 cm, previously 5 mm when measured similarly (series 4, image 61, series 2, image 110). 3. Stable 2 mm nodules of the left lung base (series 6, image 105, 116), likely benign and incidental. No definite evidence of metastatic disease in the chest. Attention on follow-up."  PLAN: -Pt's persistent nausea is concerning for partial obstruction -Discussed 10/27/2019 CT Abd/Pel ([id number]) which revealed fairly significant progression in the cecum and liver, stable pulmonary nodules  -Advised pt that we could not say that she has had progression with her current treatment regimen, due to the many breaks in her treatment, but that there is concern that FOLFIRI will not be able to hold further progression at this time -Discussed adding Avastin to her current FOLFIRI treatment. Pt has previously had an adverse reaction to Avastin. -Advised pt that we would not be able to proceed with small bowel obstructive surgery if necessary while treating with Avastin due to increased risk of bleeding and blood clots  -Will hold Avastin for C5D1. Will consider for future treatments if CEA levels rise and nausea and vomiting resolve.  -The pt has no prohibitive toxicities from continuing with C5D1 of FOLFIRI. Okay to proceed. -Will continue to watch CEA levels  -Will refer to Education officer, museum for help planning end of life care   FOLLOW UP: RTC with Dr Irene Limbo with labs and next cycle of FOLFIRI   The total time spent in the appt was 20 minutes and more than 50% was on counseling and direct patient cares.  All of  the patient's questions were answered with apparent satisfaction. The patient knows to call the clinic with any problems, questions or concerns.    Sullivan Lone MD [place] AAHIVMS [facility] Hematology/Oncology Physician [facility]  (Office):       [phone] (Work cell):  [phone] (Fax):           [phone]  11/08/2019 3:46 PM   I, Yevette Edwards, am acting as a scribe for Dr. Sullivan Lone.   .I have reviewed the above documentation for accuracy and completeness, and I agree with the above. Brunetta Genera MD

## 2019-11-09 ENCOUNTER — Inpatient Hospital Stay: Payer: Medicaid Other

## 2019-11-09 ENCOUNTER — Other Ambulatory Visit: Payer: Self-pay

## 2019-11-09 ENCOUNTER — Other Ambulatory Visit: Payer: Medicaid Other | Admitting: General Practice

## 2019-11-09 VITALS — BP 140/95 | HR 93 | Temp 98.7°F | Resp 20

## 2019-11-09 DIAGNOSIS — D509 Iron deficiency anemia, unspecified: Secondary | ICD-10-CM

## 2019-11-09 DIAGNOSIS — Z5111 Encounter for antineoplastic chemotherapy: Secondary | ICD-10-CM | POA: Diagnosis not present

## 2019-11-09 DIAGNOSIS — C189 Malignant neoplasm of colon, unspecified: Secondary | ICD-10-CM

## 2019-11-09 DIAGNOSIS — Z7189 Other specified counseling: Secondary | ICD-10-CM

## 2019-11-09 DIAGNOSIS — Z95828 Presence of other vascular implants and grafts: Secondary | ICD-10-CM

## 2019-11-09 LAB — CMP (CANCER CENTER ONLY)
ALT: 25 U/L (ref 0–44)
AST: 34 U/L (ref 15–41)
Albumin: 2.3 g/dL — ABNORMAL LOW (ref 3.5–5.0)
Alkaline Phosphatase: 146 U/L — ABNORMAL HIGH (ref 38–126)
Anion gap: 11 (ref 5–15)
BUN: 19 mg/dL (ref 6–20)
CO2: 22 mmol/L (ref 22–32)
Calcium: 8.8 mg/dL — ABNORMAL LOW (ref 8.9–10.3)
Chloride: 109 mmol/L (ref 98–111)
Creatinine: 1.04 mg/dL — ABNORMAL HIGH (ref 0.44–1.00)
GFR, Est AFR Am: >60 mL/min (ref >60)
GFR, Estimated: >60 mL/min (ref >60)
Glucose, Bld: 112 mg/dL — ABNORMAL HIGH (ref 70–99)
Potassium: 3.8 mmol/L (ref 3.5–5.1)
Sodium: 142 mmol/L (ref 135–145)
Total Bilirubin: 0.5 mg/dL (ref 0.3–1.2)
Total Protein: 7.7 g/dL (ref 6.5–8.1)

## 2019-11-09 LAB — CBC WITH DIFFERENTIAL/PLATELET
Abs Immature Granulocytes: 0.09 10*3/uL — ABNORMAL HIGH (ref 0.00–0.07)
Basophils Absolute: 0.1 10*3/uL (ref 0.0–0.1)
Basophils Relative: 1 %
Eosinophils Absolute: 0.2 10*3/uL (ref 0.0–0.5)
Eosinophils Relative: 2 %
HCT: 30.3 % — ABNORMAL LOW (ref 36.0–46.0)
Hemoglobin: 8.8 g/dL — ABNORMAL LOW (ref 12.0–15.0)
Immature Granulocytes: 1 %
Lymphocytes Relative: 25 %
Lymphs Abs: 2.5 10*3/uL (ref 0.7–4.0)
MCH: 25.7 pg — ABNORMAL LOW (ref 26.0–34.0)
MCHC: 29 g/dL — ABNORMAL LOW (ref 30.0–36.0)
MCV: 88.3 fL (ref 80.0–100.0)
Monocytes Absolute: 1.2 10*3/uL — ABNORMAL HIGH (ref 0.1–1.0)
Monocytes Relative: 12 %
Neutro Abs: 6.2 10*3/uL (ref 1.7–7.7)
Neutrophils Relative %: 59 %
Platelets: 393 10*3/uL (ref 150–400)
RBC: 3.43 MIL/uL — ABNORMAL LOW (ref 3.87–5.11)
RDW: 18 % — ABNORMAL HIGH (ref 11.5–15.5)
WBC: 10.2 10*3/uL (ref 4.0–10.5)
nRBC: 0 % (ref 0.0–0.2)

## 2019-11-09 MED ORDER — SODIUM CHLORIDE 0.9 % IV SOLN
Freq: Once | INTRAVENOUS | Status: AC
  Filled 2019-11-09: qty 250

## 2019-11-09 MED ORDER — PALONOSETRON HCL INJECTION 0.25 MG/5ML
0.2500 mg | Freq: Once | INTRAVENOUS | Status: AC
  Administered 2019-11-09: 0.25 mg via INTRAVENOUS

## 2019-11-09 MED ORDER — DEXAMETHASONE SODIUM PHOSPHATE 10 MG/ML IJ SOLN
10.0000 mg | Freq: Once | INTRAMUSCULAR | Status: AC
  Administered 2019-11-09: 10 mg via INTRAVENOUS

## 2019-11-09 MED ORDER — SODIUM CHLORIDE 0.9 % IV SOLN
2400.0000 mg/m2 | INTRAVENOUS | Status: DC
  Administered 2019-11-09: 7200 mg via INTRAVENOUS
  Filled 2019-11-09: qty 144

## 2019-11-09 MED ORDER — DEXAMETHASONE SODIUM PHOSPHATE 10 MG/ML IJ SOLN
INTRAMUSCULAR | Status: AC
  Filled 2019-11-09: qty 1

## 2019-11-09 MED ORDER — ATROPINE SULFATE 1 MG/ML IJ SOLN
0.5000 mg | Freq: Once | INTRAMUSCULAR | Status: AC | PRN
  Administered 2019-11-09: 0.5 mg via INTRAVENOUS

## 2019-11-09 MED ORDER — ATROPINE SULFATE 1 MG/ML IJ SOLN
INTRAMUSCULAR | Status: AC
  Filled 2019-11-09: qty 1

## 2019-11-09 MED ORDER — SODIUM CHLORIDE 0.9% FLUSH
10.0000 mL | Freq: Once | INTRAVENOUS | Status: AC
  Administered 2019-11-09: 10 mL
  Filled 2019-11-09: qty 10

## 2019-11-09 MED ORDER — LEUCOVORIN CALCIUM INJECTION 350 MG
400.0000 mg/m2 | Freq: Once | INTRAVENOUS | Status: AC
  Administered 2019-11-09: 1200 mg via INTRAVENOUS
  Filled 2019-11-09: qty 60

## 2019-11-09 MED ORDER — ATROPINE SULFATE 0.4 MG/ML IJ SOLN
0.4000 mg | Freq: Once | INTRAMUSCULAR | Status: AC
  Administered 2019-11-09: 0.4 mg via INTRAVENOUS

## 2019-11-09 MED ORDER — PALONOSETRON HCL INJECTION 0.25 MG/5ML
INTRAVENOUS | Status: AC
  Filled 2019-11-09: qty 5

## 2019-11-09 MED ORDER — ATROPINE SULFATE 0.4 MG/ML IJ SOLN
INTRAMUSCULAR | Status: AC
  Filled 2019-11-09: qty 1

## 2019-11-09 MED ORDER — IRINOTECAN HCL CHEMO INJECTION 100 MG/5ML
180.0000 mg/m2 | Freq: Once | INTRAVENOUS | Status: AC
  Administered 2019-11-09: 13:00:00 540 mg via INTRAVENOUS
  Filled 2019-11-09: qty 27

## 2019-11-09 NOTE — Patient Instructions (Signed)
[facility] Discharge Instructions for Patients Receiving Chemotherapy  Today you received the following chemotherapy agents: irinotecan, leucovorin, and fluorouracil.  To help prevent nausea and vomiting after your treatment, we encourage you to take your nausea medication as directed.   If you develop nausea and vomiting that is not controlled by your nausea medication, call the clinic.   BELOW ARE SYMPTOMS THAT SHOULD BE REPORTED IMMEDIATELY:  *FEVER GREATER THAN 100.5 F  *CHILLS WITH OR WITHOUT FEVER  NAUSEA AND VOMITING THAT IS NOT CONTROLLED WITH YOUR NAUSEA MEDICATION  *UNUSUAL SHORTNESS OF BREATH  *UNUSUAL BRUISING OR BLEEDING  TENDERNESS IN MOUTH AND THROAT WITH OR WITHOUT PRESENCE OF ULCERS  *URINARY PROBLEMS  *BOWEL PROBLEMS  UNUSUAL RASH Items with * indicate a potential emergency and should be followed up as soon as possible.  Feel free to call the clinic should you have any questions or concerns. The clinic phone number is [phone].  Please show the CHEMO ALERT CARD at check-in to the Emergency Department and triage nurse.   

## 2019-11-11 ENCOUNTER — Inpatient Hospital Stay: Payer: Medicaid Other

## 2019-11-11 ENCOUNTER — Other Ambulatory Visit: Payer: Self-pay

## 2019-11-11 VITALS — BP 155/89 | HR 88 | Temp 98.1°F | Resp 18

## 2019-11-11 DIAGNOSIS — Z5111 Encounter for antineoplastic chemotherapy: Secondary | ICD-10-CM | POA: Diagnosis not present

## 2019-11-11 DIAGNOSIS — Z7189 Other specified counseling: Secondary | ICD-10-CM

## 2019-11-11 DIAGNOSIS — C189 Malignant neoplasm of colon, unspecified: Secondary | ICD-10-CM

## 2019-11-11 MED ORDER — SODIUM CHLORIDE 0.9% FLUSH
10.0000 mL | INTRAVENOUS | Status: DC | PRN
  Administered 2019-11-11: 13:00:00 10 mL
  Filled 2019-11-11: qty 10

## 2019-11-11 MED ORDER — HEPARIN SOD (PORK) LOCK FLUSH 100 UNIT/ML IV SOLN
500.0000 [IU] | Freq: Once | INTRAVENOUS | Status: AC | PRN
  Administered 2019-11-11: 13:00:00 500 [IU]
  Filled 2019-11-11: qty 5

## 2019-11-11 MED ORDER — PEGFILGRASTIM-CBQV 6 MG/0.6ML ~~LOC~~ SOSY
6.0000 mg | PREFILLED_SYRINGE | Freq: Once | SUBCUTANEOUS | Status: AC
  Administered 2019-11-11: 13:00:00 6 mg via SUBCUTANEOUS

## 2019-11-11 MED ORDER — PEGFILGRASTIM-CBQV 6 MG/0.6ML ~~LOC~~ SOSY
PREFILLED_SYRINGE | SUBCUTANEOUS | Status: AC
  Filled 2019-11-11: qty 0.6

## 2019-11-15 ENCOUNTER — Encounter: Payer: Self-pay | Admitting: Hematology

## 2019-11-16 ENCOUNTER — Other Ambulatory Visit: Payer: Self-pay | Admitting: Hematology

## 2019-11-16 ENCOUNTER — Telehealth: Payer: Self-pay | Admitting: *Deleted

## 2019-11-16 MED ORDER — OXYCODONE HCL 5 MG PO TABS: 5.0000 mg | ORAL_TABLET | ORAL | 0 refills | Status: AC | PRN

## 2019-11-16 NOTE — Telephone Encounter (Signed)
Dr. Irene Limbo signed Live in Care attendant verification for [facility] on patient behalf. Faxed to [phone], attn Jake Church. Fax confirmation received

## 2019-11-17 ENCOUNTER — Ambulatory Visit: Payer: Medicaid Other | Admitting: Hematology

## 2019-11-17 ENCOUNTER — Other Ambulatory Visit: Payer: Medicaid Other

## 2019-11-17 ENCOUNTER — Ambulatory Visit: Payer: Medicaid Other

## 2019-11-23 ENCOUNTER — Inpatient Hospital Stay (HOSPITAL_BASED_OUTPATIENT_CLINIC_OR_DEPARTMENT_OTHER): Payer: Medicaid Other | Admitting: Hematology

## 2019-11-23 ENCOUNTER — Other Ambulatory Visit: Payer: Self-pay

## 2019-11-23 ENCOUNTER — Inpatient Hospital Stay: Payer: Medicaid Other | Attending: Hematology

## 2019-11-23 ENCOUNTER — Inpatient Hospital Stay: Payer: Medicaid Other

## 2019-11-23 VITALS — BP 135/88 | HR 83 | Temp 98.3°F | Resp 19 | Wt 364.2 lb

## 2019-11-23 DIAGNOSIS — Z7952 Long term (current) use of systemic steroids: Secondary | ICD-10-CM | POA: Insufficient documentation

## 2019-11-23 DIAGNOSIS — Z7189 Other specified counseling: Secondary | ICD-10-CM

## 2019-11-23 DIAGNOSIS — Z79899 Other long term (current) drug therapy: Secondary | ICD-10-CM | POA: Diagnosis not present

## 2019-11-23 DIAGNOSIS — E538 Deficiency of other specified B group vitamins: Secondary | ICD-10-CM | POA: Diagnosis not present

## 2019-11-23 DIAGNOSIS — R1031 Right lower quadrant pain: Secondary | ICD-10-CM | POA: Insufficient documentation

## 2019-11-23 DIAGNOSIS — Z5111 Encounter for antineoplastic chemotherapy: Secondary | ICD-10-CM | POA: Diagnosis present

## 2019-11-23 DIAGNOSIS — R200 Anesthesia of skin: Secondary | ICD-10-CM | POA: Diagnosis not present

## 2019-11-23 DIAGNOSIS — M1990 Unspecified osteoarthritis, unspecified site: Secondary | ICD-10-CM | POA: Insufficient documentation

## 2019-11-23 DIAGNOSIS — R634 Abnormal weight loss: Secondary | ICD-10-CM | POA: Insufficient documentation

## 2019-11-23 DIAGNOSIS — Z791 Long term (current) use of non-steroidal anti-inflammatories (NSAID): Secondary | ICD-10-CM | POA: Insufficient documentation

## 2019-11-23 DIAGNOSIS — Z95828 Presence of other vascular implants and grafts: Secondary | ICD-10-CM

## 2019-11-23 DIAGNOSIS — J45909 Unspecified asthma, uncomplicated: Secondary | ICD-10-CM | POA: Insufficient documentation

## 2019-11-23 DIAGNOSIS — D509 Iron deficiency anemia, unspecified: Secondary | ICD-10-CM | POA: Insufficient documentation

## 2019-11-23 DIAGNOSIS — C787 Secondary malignant neoplasm of liver and intrahepatic bile duct: Secondary | ICD-10-CM

## 2019-11-23 DIAGNOSIS — C180 Malignant neoplasm of cecum: Secondary | ICD-10-CM | POA: Diagnosis not present

## 2019-11-23 DIAGNOSIS — R42 Dizziness and giddiness: Secondary | ICD-10-CM | POA: Diagnosis not present

## 2019-11-23 DIAGNOSIS — Z7951 Long term (current) use of inhaled steroids: Secondary | ICD-10-CM | POA: Diagnosis not present

## 2019-11-23 DIAGNOSIS — Z5112 Encounter for antineoplastic immunotherapy: Secondary | ICD-10-CM | POA: Diagnosis present

## 2019-11-23 DIAGNOSIS — Z5189 Encounter for other specified aftercare: Secondary | ICD-10-CM | POA: Diagnosis present

## 2019-11-23 DIAGNOSIS — C189 Malignant neoplasm of colon, unspecified: Secondary | ICD-10-CM | POA: Diagnosis not present

## 2019-11-23 DIAGNOSIS — R0602 Shortness of breath: Secondary | ICD-10-CM | POA: Diagnosis not present

## 2019-11-23 DIAGNOSIS — M545 Low back pain: Secondary | ICD-10-CM | POA: Diagnosis not present

## 2019-11-23 DIAGNOSIS — I10 Essential (primary) hypertension: Secondary | ICD-10-CM | POA: Diagnosis not present

## 2019-11-23 DIAGNOSIS — Z87891 Personal history of nicotine dependence: Secondary | ICD-10-CM | POA: Insufficient documentation

## 2019-11-23 LAB — CMP (CANCER CENTER ONLY)
ALT: 16 U/L (ref 0–44)
AST: 27 U/L (ref 15–41)
Albumin: 2.4 g/dL — ABNORMAL LOW (ref 3.5–5.0)
Alkaline Phosphatase: 138 U/L — ABNORMAL HIGH (ref 38–126)
Anion gap: 9 (ref 5–15)
BUN: 10 mg/dL (ref 6–20)
CO2: 24 mmol/L (ref 22–32)
Calcium: 8.7 mg/dL — ABNORMAL LOW (ref 8.9–10.3)
Chloride: 111 mmol/L (ref 98–111)
Creatinine: 0.92 mg/dL (ref 0.44–1.00)
GFR, Est AFR Am: >60 mL/min (ref >60)
GFR, Estimated: >60 mL/min (ref >60)
Glucose, Bld: 93 mg/dL (ref 70–99)
Potassium: 3.6 mmol/L (ref 3.5–5.1)
Sodium: 144 mmol/L (ref 135–145)
Total Bilirubin: 0.4 mg/dL (ref 0.3–1.2)
Total Protein: 7 g/dL (ref 6.5–8.1)

## 2019-11-23 LAB — CBC WITH DIFFERENTIAL/PLATELET
Abs Immature Granulocytes: 0.1 10*3/uL — ABNORMAL HIGH (ref 0.00–0.07)
Basophils Absolute: 0 10*3/uL (ref 0.0–0.1)
Basophils Relative: 1 %
Eosinophils Absolute: 0.1 10*3/uL (ref 0.0–0.5)
Eosinophils Relative: 1 %
HCT: 27.8 % — ABNORMAL LOW (ref 36.0–46.0)
Hemoglobin: 7.9 g/dL — ABNORMAL LOW (ref 12.0–15.0)
Immature Granulocytes: 1 %
Lymphocytes Relative: 39 %
Lymphs Abs: 3.2 10*3/uL (ref 0.7–4.0)
MCH: 25.6 pg — ABNORMAL LOW (ref 26.0–34.0)
MCHC: 28.4 g/dL — ABNORMAL LOW (ref 30.0–36.0)
MCV: 90 fL (ref 80.0–100.0)
Monocytes Absolute: 0.7 10*3/uL (ref 0.1–1.0)
Monocytes Relative: 9 %
Neutro Abs: 4.1 10*3/uL (ref 1.7–7.7)
Neutrophils Relative %: 49 %
Platelets: 307 10*3/uL (ref 150–400)
RBC: 3.09 MIL/uL — ABNORMAL LOW (ref 3.87–5.11)
RDW: 18.6 % — ABNORMAL HIGH (ref 11.5–15.5)
WBC: 8.3 10*3/uL (ref 4.0–10.5)
nRBC: 0.2 % (ref 0.0–0.2)

## 2019-11-23 LAB — SAMPLE TO BLOOD BANK

## 2019-11-23 MED ORDER — SODIUM CHLORIDE 0.9% FLUSH
10.0000 mL | Freq: Once | INTRAVENOUS | Status: AC
  Administered 2019-11-23: 11:00:00 10 mL
  Filled 2019-11-23: qty 10

## 2019-11-23 MED ORDER — SODIUM CHLORIDE 0.9 % IV SOLN
2400.0000 mg/m2 | INTRAVENOUS | Status: DC
  Administered 2019-11-23: 15:00:00 7200 mg via INTRAVENOUS
  Filled 2019-11-23: qty 144

## 2019-11-23 MED ORDER — DEXAMETHASONE SODIUM PHOSPHATE 10 MG/ML IJ SOLN
INTRAMUSCULAR | Status: AC
  Filled 2019-11-23: qty 1

## 2019-11-23 MED ORDER — LEUCOVORIN CALCIUM INJECTION 350 MG
400.0000 mg/m2 | Freq: Once | INTRAVENOUS | Status: AC
  Administered 2019-11-23: 1200 mg via INTRAVENOUS
  Filled 2019-11-23: qty 60

## 2019-11-23 MED ORDER — SODIUM CHLORIDE 0.9 % IV SOLN
Freq: Once | INTRAVENOUS | Status: AC
  Filled 2019-11-23: qty 250

## 2019-11-23 MED ORDER — IRINOTECAN HCL CHEMO INJECTION 100 MG/5ML
180.0000 mg/m2 | Freq: Once | INTRAVENOUS | Status: AC
  Administered 2019-11-23: 13:00:00 540 mg via INTRAVENOUS
  Filled 2019-11-23: qty 27

## 2019-11-23 MED ORDER — PALONOSETRON HCL INJECTION 0.25 MG/5ML
INTRAVENOUS | Status: AC
  Filled 2019-11-23: qty 5

## 2019-11-23 MED ORDER — DEXAMETHASONE SODIUM PHOSPHATE 10 MG/ML IJ SOLN
10.0000 mg | Freq: Once | INTRAMUSCULAR | Status: AC
  Administered 2019-11-23: 10 mg via INTRAVENOUS

## 2019-11-23 MED ORDER — ATROPINE SULFATE 0.4 MG/ML IJ SOLN
INTRAMUSCULAR | Status: AC
  Filled 2019-11-23: qty 1

## 2019-11-23 MED ORDER — PALONOSETRON HCL INJECTION 0.25 MG/5ML
0.2500 mg | Freq: Once | INTRAVENOUS | Status: AC
  Administered 2019-11-23: 0.25 mg via INTRAVENOUS

## 2019-11-23 MED ORDER — ATROPINE SULFATE 0.4 MG/ML IJ SOLN
0.4000 mg | Freq: Once | INTRAMUSCULAR | Status: AC
  Administered 2019-11-23: 13:00:00 0.4 mg via INTRAVENOUS

## 2019-11-23 NOTE — Progress Notes (Signed)
Okay to treat with Hgb 7.9 per Dr. Irene Limbo.  Patient aware of appt on Friday at noon for pump stop and will receive 2 units PRBC's. Per Dr. Irene Limbo can bolus 5FU on Friday upon arrival and then initiate blood transfusion.

## 2019-11-23 NOTE — Patient Instructions (Signed)
[facility] Discharge Instructions for Patients Receiving Chemotherapy  Today you received the following chemotherapy agents: Irinotecan, leucovorin, 5FU  To help prevent nausea and vomiting after your treatment, we encourage you to take your nausea medication as directed.   If you develop nausea and vomiting that is not controlled by your nausea medication, call the clinic.   BELOW ARE SYMPTOMS THAT SHOULD BE REPORTED IMMEDIATELY:  *FEVER GREATER THAN 100.5 F  *CHILLS WITH OR WITHOUT FEVER  NAUSEA AND VOMITING THAT IS NOT CONTROLLED WITH YOUR NAUSEA MEDICATION  *UNUSUAL SHORTNESS OF BREATH  *UNUSUAL BRUISING OR BLEEDING  TENDERNESS IN MOUTH AND THROAT WITH OR WITHOUT PRESENCE OF ULCERS  *URINARY PROBLEMS  *BOWEL PROBLEMS  UNUSUAL RASH Items with * indicate a potential emergency and should be followed up as soon as possible.  Feel free to call the clinic should you have any questions or concerns. The clinic phone number is [phone].  Please show the CHEMO ALERT CARD at check-in to the Emergency Department and triage nurse.   

## 2019-11-23 NOTE — Progress Notes (Signed)
HEMATOLOGY/ONCOLOGY CLINIC NOTE  Date of Service: 11/23/2019  Patient Care Team: Nolene Ebbs, MD as PCP - General (Internal Medicine) Jackelyn Knife, MD as Rounding Team (Internal Medicine)   CHIEF COMPLAINTS/PURPOSE OF CONSULTATION:   Continued mx of  Metastatic Adenocarcinoma of colon   HISTORY OF PRESENTING ILLNESS:  Margaret [name] is a wonderful 49 y.o. female who has been referred to [place] by Dr. Nolene Ebbs for evaluation and management of Iron deficiency anemia. The pt reports that she is doing well overall.   The pt recently presented to the ED on 04/16/18 for right sided abdominal pain that was evaluated with a CT A/P which revealed liver and pancreatic cyst, recommended for outpatient MRI follow up. The pt was found to have a UTI, was treated with Rocephin and discharged with Keflex. Prior to this the pt was admitted on 04/09/18 for symptomatic anemia, presenting with HGB at 6.2, received 2 units PRBCs, one IV Iron infusion, and was encouraged to seek out GI as outpatient.  She has an appointment with [facility] on 05/19/18.   The pt reports that she is still feeling dizzy and light headed and denies feeling better after her recent blood transfusion. She notes that she dizziness presents with a feeling of warmness and that she begins hyperventilating, feeling nervous about her dizziness.  She notes that prior to her recent hospital stay, she never required a blood transfusion or IV iron replacement. She notes that as a child she had frequent nose bleeds, and was diagnosed with anemia. She also notes a history of ice picca.   She had a hysterectomy in 2012, and prior to this she had very heavy periods that began at age 40 and occurred almost constantly. She was on Depo and Mirena which did not help slow menstrual losses. Besides taking iron supplements while pregnant she has not taken PO iron replacement as she reports not tolerating it very well while pregnant.   She denies  concern for black stools or blood in the stools and has not had a colonoscopy or endoscopy before. The pt notes that she has not previously had a concern for stomach ulcers, but has acid reflux and takes Prevacid as needed for 7-8 years. She notes that she has not recently used Prevacid, for the last 3 months.  She is not taking vitamin replacements and denies any dietary restrictions.  The pt notes that she has been continuing to have lower right quadrant abdominal pain that radiates across her abdomen, presents for up to 45 minutes and occurs intermittently. She notes that her abdominal pain presented on 04/13/18.   The pt notes that was taking Ibuprofen 870m BID for 8 years to address her back pain related to her herniated disk. She stopped taking this 4 months ago.   Of note prior to the patient's visit today, pt has had CT A/P completed on 04/16/18 with results revealing No acute findings in the abdomen/pelvis. Least 4 low-density hepatic lesions, some of which may represent small cysts or hemangiomas, however there is a 19 mm lesion in the left lobe of the liver that is incompletely characterized. Recommend nonemergent hepatic protocol MRI for further evaluation..Marland KitchenPossible 11 mm pancreatic nodule rising from the tail, alternatively this may represent a splenule. This can also be assessed at time of hepatic MRI. Gallstone without gallbladder inflammation. Colonic diverticulosis without diverticulitis.   Most recent lab results (04/16/18) of CBC is as follows: all values are WNL except for HGB at  9.5, HCT at 33.6, MCH at 23.8, MCHC at 28.3, RDW at 23.2, PLT at 435k. Ferritin 04/09/18 was low at 6 Vitamin B12 on 04/09/18 was at 196  On review of systems, pt reports light headedness, dizziness, lower back pain, intermittent abdominal pain, and denies black stools, blood in the stools, tingling or numbness in her legs or arms, vaginal bleeding, and any other symptoms.   INTERVAL HISTORY:   Margaret [name] returns today for management, evaluation of her Metastatic Adenocarcinoma of GI primary. She is here for a very delayed C6D1 of FOLFIRI. The patient's last visit with [place] was on 11/08/2019. The pt reports that she is doing well overall.  The pt reports that she has been very SOB lately. When she walks she is sometimes lightheaded and dizzy. She has been having intermittent numbness in her feet, which caused her to fall on Friday. She re-injured her right knee during her fall. Pt has been using her cane more at home, but was not using it prior to her fall. Her nausea and vomiting have improved. Pt has been using Oxycodone as needed to help with her abdominal pain, which has been controlling it well. Ativan has also helped with her nausea. Marinol did not help with her nausea or vomiting symptoms. She has been eating several small meals per day and is drinking a sufficient amount of water. Pt is currently eating at about 66% of her baseline and has lost some weight.  Pt has discussed her prognosis with her family and is working to get her affairs in order. She has been in contact with Edwyna Shell, our social worker, who is assisting her with planning. She is becoming overwhelmed by anxiety because she is unable to do some things for herself like used to. She is also concerned about some of her family member's responses to her diagnosis. Pt has continued being active at church and is participating in a virtual support group.   Lab results today (11/23/19) of CBC w/diff and CMP is as follows: all values are WNL except for RBC at 3.09, Hgb at 7.9, HCT at 27.8, MCH at 25.6, MCHC at 28.4, RDW at 18.6, Abs Immature Granulocytes at 0.10K, Calcium at 8.7, Albumin at 2.4, ALP at 138.  On review of systems, pt reports diarrhea, unexpected weight loss, SOB, lightheadedness, dizziness, low appetite, numbness in feet and denies fevers, chills, night sweats, nausea, vomiting, abdominal pain, constipation,  black/bloody stools, leg swelling and any other symptoms.    MEDICAL HISTORY:  Past Medical History:  Diagnosis Date  . Allergic rhinitis   . Anxiety diagnosed in 1990  . Arthritis   . Asthma   . colon ca with liver mets dx'd 08/2018  . Dyspnea    with low blood count hx of anemia  . Eczema   . History of blood transfusion   . Hypertension   . Lower back pain   . Migraine   . Sleep apnea    uses CPAP    SURGICAL HISTORY: Past Surgical History:  Procedure Laterality Date  . ABDOMINAL HYSTERECTOMY    . cortisone injections     knees bilat and back   . IR IMAGING GUIDED PORT INSERTION  09/14/2018  . iron infusion    . LIVER BIOPSY      SOCIAL HISTORY: Social History   Socioeconomic History  . Marital status: Single    Spouse name: Not on file  . Number of children: Not on file  .  Years of education: Not on file  . Highest education level: Not on file  Occupational History  . Not on file  Tobacco Use  . Smoking status: Former Smoker    Quit date: 07/30/1999    Years since quitting: 20.3  . Smokeless tobacco: Never Used  Substance and Sexual Activity  . Alcohol use: No  . Drug use: No  . Sexual activity: Not on file  Other Topics Concern  . Not on file  Social History Narrative  . Not on file   Social Determinants of Health   Financial Resource Strain:   . Difficulty of Paying Living Expenses: Not on file  Food Insecurity:   . Worried About Charity fundraiser in the Last Year: Not on file  . Ran Out of Food in the Last Year: Not on file  Transportation Needs:   . Lack of Transportation (Medical): Not on file  . Lack of Transportation (Non-Medical): Not on file  Physical Activity:   . Days of Exercise per Week: Not on file  . Minutes of Exercise per Session: Not on file  Stress:   . Feeling of Stress : Not on file  Social Connections:   . Frequency of Communication with Friends and Family: Not on file  . Frequency of Social Gatherings with Friends  and Family: Not on file  . Attends Religious Services: Not on file  . Active Member of Clubs or Organizations: Not on file  . Attends Archivist Meetings: Not on file  . Marital Status: Not on file  Intimate Partner Violence:   . Fear of Current or Ex-Partner: Not on file  . Emotionally Abused: Not on file  . Physically Abused: Not on file  . Sexually Abused: Not on file    FAMILY HISTORY: Family History  Problem Relation Age of Onset  . Diabetes Father     ALLERGIES:  is allergic to latex; tape; and tylenol with codeine #3 [acetaminophen-codeine].  MEDICATIONS:  Current Outpatient Medications  Medication Sig Dispense Refill  . albuterol (PROAIR HFA) 108 (90 Base) MCG/ACT inhaler INHALE TWO puffs BY MOUTH into the lungs EVERY 4 HOURS AS NEEDED FOR WHEEZING OR shortness of breath 18 g 1  . Boric Acid 4 % SOLN Place 1 capsule vaginally every Friday.     . budesonide-formoterol (SYMBICORT) 160-4.5 MCG/ACT inhaler Inhale 2 puffs into the lungs 2 (two) times a day. 10.2 g 5  . cetirizine (ZYRTEC) 10 MG tablet Take 1 tablet (10 mg total) by mouth daily. 30 tablet 5  . clobetasol ointment (TEMOVATE) 3.41 % Apply 1 application topically 2 (two) times daily. Use for Lichen Sclerosis    . Cyanocobalamin (B-12) 1000 MCG SUBL Place 1,000 mcg under the tongue daily. 30 each 3  . dexamethasone (DECADRON) 4 MG tablet TAKE 2 TABLETS (8 MG TOTAL) BY MOUTH DAILY  START THE DAY AFTER CHEMOTHERAPY FOR 2 DAYS. TAKE WITH FOOD. 30 tablet 1  . diazepam (VALIUM) 5 MG tablet Take 1 tablet (5 mg total) by mouth daily as needed for anxiety (panic attacks). 30 tablet 0  . diclofenac sodium (VOLTAREN) 1 % GEL Apply 2 g topically 4 (four) times daily as needed (for back/knee pain.).     Marland Kitchen dicyclomine (BENTYL) 20 MG tablet Take 1 tablet (20 mg total) by mouth 2 (two) times daily. (Patient taking differently: Take 20 mg by mouth 3 (three) times daily before meals. ) 20 tablet 0  . dronabinol (MARINOL) 5 MG  capsule  Take 1 capsule (5 mg total) by mouth 2 (two) times daily before a meal. 60 capsule 0  . ergocalciferol (VITAMIN D2) 50000 UNITS capsule Take 50,000 Units by mouth every Monday.     . lidocaine-prilocaine (EMLA) cream Apply to affected area once 30 g 3  . lisinopril-hydrochlorothiazide (PRINZIDE,ZESTORETIC) 20-12.5 MG tablet Take 1 tablet by mouth daily.    . methocarbamol (ROBAXIN) 750 MG tablet Take 750 mg by mouth every 8 (eight) hours as needed (back spasms.).     Marland Kitchen montelukast (SINGULAIR) 10 MG tablet Take 1 tablet (10 mg total) by mouth at bedtime. 30 tablet 6  . nystatin-triamcinolone (MYCOLOG II) cream Apply 1 application topically 2 (two) times daily as needed (irritation). Use in skin folds and under breast     . Olopatadine HCl (PAZEO) 0.7 % SOLN Apply 1 drop to eye daily. 2.5 mL 5  . ondansetron (ZOFRAN) 8 MG tablet Take 1 tablet (8 mg total) by mouth 2 (two) times daily as needed for refractory nausea / vomiting. Start on day 3 after chemotherapy. 30 tablet 0  . pantoprazole (PROTONIX) 40 MG tablet Take 1 tablet (40 mg total) by mouth daily before breakfast. 30 tablet 1  . pimecrolimus (ELIDEL) 1 % cream Apply 1 application topically 2 (two) times daily. 30 g 6  . prochlorperazine (COMPAZINE) 10 MG tablet Take 1 tablet (10 mg total) by mouth every 6 (six) hours as needed (Nausea or vomiting). 30 tablet 1  . saccharomyces boulardii (FLORASTOR) 250 MG capsule Take 1 capsule (250 mg total) by mouth 2 (two) times daily.    . traMADol (ULTRAM) 50 MG tablet Take 1-2 tablets (50-100 mg total) by mouth every 6 (six) hours as needed for moderate pain or severe pain. 90 tablet 0  . triamcinolone (NASACORT) 55 MCG/ACT AERO nasal inhaler Place 2 sprays into the nose daily as needed (for allergies). 1 Inhaler 5  . valACYclovir (VALTREX) 500 MG tablet Take 500 mg by mouth 2 (two) times daily as needed (for cold sores).      No current facility-administered medications for this visit.     Facility-Administered Medications Ordered in Other Visits  Medication Dose Route Frequency Provider Last Rate Last Dose  . fluorouracil (ADRUCIL) 6,950 mg in sodium chloride 0.9 % 111 mL chemo infusion  2,400 mg/m2 (Treatment Plan Recorded) Intravenous 1 day or 1 dose Brunetta Genera, MD      . leucovorin 1,156 mg in dextrose 5 % 250 mL infusion  400 mg/m2 (Treatment Plan Recorded) Intravenous Once Brunetta Genera, MD 77 mL/hr at 05/24/19 1242 1,156 mg at 05/24/19 1242  . oxaliplatin (ELOXATIN) 250 mg in dextrose 5 % 500 mL chemo infusion  250 mg Intravenous Once Brunetta Genera, MD 138 mL/hr at 05/24/19 1245 250 mg at 05/24/19 1245    REVIEW OF SYSTEMS:   A 10+ POINT REVIEW OF SYSTEMS WAS OBTAINED including neurology, dermatology, psychiatry, cardiac, respiratory, lymph, extremities, GI, GU, Musculoskeletal, constitutional, breasts, reproductive, HEENT.  All pertinent positives are noted in the HPI.  All others are negative.   PHYSICAL EXAMINATION:  ECOG FS:2 - Symptomatic, <50% confined to bed  There were no vitals filed for this visit. Wt Readings from Last 3 Encounters:  10/20/19 (!) 402 lb 12.8 oz (182.7 kg)  10/06/19 (!) 400 lb 12.8 oz (181.8 kg)  09/12/19 (!) 416 lb (188.7 kg)   There is no height or weight on file to calculate BMI.    Exam was given in  a chair   GENERAL:alert, in no acute distress and comfortable SKIN: no acute rashes, no significant lesions EYES: conjunctiva are pink and non-injected, sclera anicteric OROPHARYNX: MMM, no exudates, no oropharyngeal erythema or ulceration NECK: supple, no JVD LYMPH:  no palpable lymphadenopathy in the cervical, axillary or inguinal regions LUNGS: clear to auscultation b/l with normal respiratory effort HEART: regular rate & rhythm ABDOMEN:  normoactive bowel sounds , non tender, not distended. No palpable hepatosplenomegaly.  Extremity: no pedal edema PSYCH: alert & oriented x 3 with fluent speech NEURO: no  focal motor/sensory deficits   LABORATORY DATA:  I have reviewed the data as listed  . CBC Latest Ref Rng & Units 11/23/2019 11/09/2019 10/20/2019  WBC 4.0 - 10.5 K/uL 8.3 10.2 7.3  Hemoglobin 12.0 - 15.0 g/dL 7.9(L) 8.8(L) 8.8(L)  Hematocrit 36.0 - 46.0 % 27.8(L) 30.3(L) 30.2(L)  Platelets 150 - 400 K/uL 307 393 328   ANC 900 . CMP Latest Ref Rng & Units 11/23/2019 11/09/2019 10/20/2019  Glucose 70 - 99 mg/dL 93 112(H) 96  BUN 6 - 20 mg/dL _0 Creatinine 0.44 - 1.00 mg/dL 0.92 1.04(H) 0.88  Sodium 135 - 145 mmol/L 144 142 140  Potassium 3.5 - 5.1 mmol/L 3.6 3.8 3.9  Chloride 98 - 111 mmol/L 111 109 106  CO2 22 - 32 mmol/L _1 Calcium 8.9 - 10.3 mg/dL 8.7(L) 8.8(L) 8.9  Total Protein 6.5 - 8.1 g/dL 7.0 7.7 7.2  Total Bilirubin 0.3 - 1.2 mg/dL 0.4 0.5 0.5  Alkaline Phos 38 - 126 U/L 138(H) 146(H) 99  AST 15 - 41 U/L 27 34 28  ALT 0 - 44 U/L _2 . Lab Results  Component Value Date   IRON 26 (L) 10/20/2019   TIBC 184 (L) 10/20/2019   IRONPCTSAT 14 (L) 10/20/2019   (Iron and TIBC)  Lab Results  Component Value Date   FERRITIN 478 (H) 10/20/2019   B12  - 196--> 584  07/22/18 Liver Biopsy:   08/27/18 Repeat Liver Biopsy:   Molecular Pathology:     RADIOGRAPHIC STUDIES: I have personally reviewed the radiological images as listed and agreed with the findings in the report. CT Chest W Contrast  Result Date: 10/27/2019 CLINICAL DATA:  Metastatic colon cancer, evaluate treatment response EXAM: CT CHEST, ABDOMEN, AND PELVIS WITH CONTRAST TECHNIQUE: Multidetector CT imaging of the chest, abdomen and pelvis was performed following the standard protocol during bolus administration of intravenous contrast. CONTRAST:  123m OMNIPAQUE IOHEXOL 300 MG/ML SOLN, additional oral enteric contrast COMPARISON:  06/15/2019 FINDINGS: CT CHEST FINDINGS Cardiovascular: Right chest port catheter. Normal heart size. Enlargement of the main pulmonary artery up to 3.7 cm no  pericardial effusion. Mediastinum/Nodes: No enlarged mediastinal, hilar, or axillary lymph nodes. Large, heterogeneous nodule of the right lobe of the thyroid, measuring 2.6 cm. Trachea, and esophagus demonstrate no significant findings. Lungs/Pleura: Unchanged bandlike scarring and volume loss of the right lobe lower and middle lobes. Unchanged small pulmonary nodules, including a 2 mm subpleural nodule of the left lower lobe (series 6, image 103) and a 4 mm nodule of the medial anterior right upper lobe (series 6, image 49). No pleural effusion or pneumothorax. Musculoskeletal: No chest wall mass or suspicious bone lesions identified. CT ABDOMEN PELVIS FINDINGS Hepatobiliary: Marked interval increase in size of multiple bulky hypodense masses of the liver, the largest in the lateral right lobe measuring 10.1 x 6.3 cm, previously 4.0 x 3.6 cm (series 2, image  1). There are multiple additional lesions of the right lobe of the liver and new lesions in the left lobe of the liver. No gallstones, gallbladder wall thickening, or biliary dilatation. Pancreas: Unremarkable. No pancreatic ductal dilatation or surrounding inflammatory changes. Spleen: Normal in size without significant abnormality. Adrenals/Urinary Tract: Adrenal glands are unremarkable. Kidneys are normal, without renal calculi, solid lesion, or hydronephrosis. Bladder is unremarkable. Stomach/Bowel: Stomach is within normal limits. Appendix appears normal. Significant interval increase in thickness and bulk of an endoluminal mass of the cecal base near the ileocecal valve, measuring at least 3.1 cm in thickness, previously approximately 1.6 cm (series 4, image 78). Descending and sigmoid diverticulosis. Vascular/Lymphatic: No significant vascular findings are present. No enlarged abdominal or pelvic lymph nodes. Reproductive: Status post hysterectomy. Other: No abdominal wall hernia or abnormality. No abdominopelvic ascites. Musculoskeletal: No acute or  significant osseous findings. Unchanged benign sclerotic bone island of the right ilium (series 4, image 95). IMPRESSION: 1. Significant interval increase in size and bulk of an endoluminal mass of the cecal base near the ileocecal valve, measuring at least 3.1 cm in thickness, previously approximately 1.6 cm. Findings are consistent with worsened primary malignancy. 2. Marked interval increase in size of multiple bulky hypodense masses of the liver, the largest in the lateral right lobe measuring 10.1 x 6.3 cm, previously 4.0 x 3.6 cm. There are multiple additional lesions of the right lobe of the liver and new lesions in the left lobe of the liver. Findings are consistent with worsened hepatic metastatic disease. 3. Stable small pulmonary nodules, likely benign and incidental. No specific evidence of metastatic disease within the chest. Attention on follow-up. 4. Enlargement of the main pulmonary artery up to 3.7 cm, which can be seen with pulmonary arterial hypertension. Electronically Signed   By: Eddie Candle M.D.   On: 10/27/2019 16:36   CT Abdomen Pelvis W Contrast  Result Date: 10/27/2019 CLINICAL DATA:  Metastatic colon cancer, evaluate treatment response EXAM: CT CHEST, ABDOMEN, AND PELVIS WITH CONTRAST TECHNIQUE: Multidetector CT imaging of the chest, abdomen and pelvis was performed following the standard protocol during bolus administration of intravenous contrast. CONTRAST:  164m OMNIPAQUE IOHEXOL 300 MG/ML SOLN, additional oral enteric contrast COMPARISON:  06/15/2019 FINDINGS: CT CHEST FINDINGS Cardiovascular: Right chest port catheter. Normal heart size. Enlargement of the main pulmonary artery up to 3.7 cm no pericardial effusion. Mediastinum/Nodes: No enlarged mediastinal, hilar, or axillary lymph nodes. Large, heterogeneous nodule of the right lobe of the thyroid, measuring 2.6 cm. Trachea, and esophagus demonstrate no significant findings. Lungs/Pleura: Unchanged bandlike scarring and  volume loss of the right lobe lower and middle lobes. Unchanged small pulmonary nodules, including a 2 mm subpleural nodule of the left lower lobe (series 6, image 103) and a 4 mm nodule of the medial anterior right upper lobe (series 6, image 49). No pleural effusion or pneumothorax. Musculoskeletal: No chest wall mass or suspicious bone lesions identified. CT ABDOMEN PELVIS FINDINGS Hepatobiliary: Marked interval increase in size of multiple bulky hypodense masses of the liver, the largest in the lateral right lobe measuring 10.1 x 6.3 cm, previously 4.0 x 3.6 cm (series 2, image 47). There are multiple additional lesions of the right lobe of the liver and new lesions in the left lobe of the liver. No gallstones, gallbladder wall thickening, or biliary dilatation. Pancreas: Unremarkable. No pancreatic ductal dilatation or surrounding inflammatory changes. Spleen: Normal in size without significant abnormality. Adrenals/Urinary Tract: Adrenal glands are unremarkable. Kidneys are normal, without renal calculi,  solid lesion, or hydronephrosis. Bladder is unremarkable. Stomach/Bowel: Stomach is within normal limits. Appendix appears normal. Significant interval increase in thickness and bulk of an endoluminal mass of the cecal base near the ileocecal valve, measuring at least 3.1 cm in thickness, previously approximately 1.6 cm (series 4, image 78). Descending and sigmoid diverticulosis. Vascular/Lymphatic: No significant vascular findings are present. No enlarged abdominal or pelvic lymph nodes. Reproductive: Status post hysterectomy. Other: No abdominal wall hernia or abnormality. No abdominopelvic ascites. Musculoskeletal: No acute or significant osseous findings. Unchanged benign sclerotic bone island of the right ilium (series 4, image 95). IMPRESSION: 1. Significant interval increase in size and bulk of an endoluminal mass of the cecal base near the ileocecal valve, measuring at least 3.1 cm in thickness,  previously approximately 1.6 cm. Findings are consistent with worsened primary malignancy. 2. Marked interval increase in size of multiple bulky hypodense masses of the liver, the largest in the lateral right lobe measuring 10.1 x 6.3 cm, previously 4.0 x 3.6 cm. There are multiple additional lesions of the right lobe of the liver and new lesions in the left lobe of the liver. Findings are consistent with worsened hepatic metastatic disease. 3. Stable small pulmonary nodules, likely benign and incidental. No specific evidence of metastatic disease within the chest. Attention on follow-up. 4. Enlargement of the main pulmonary artery up to 3.7 cm, which can be seen with pulmonary arterial hypertension. Electronically Signed   By: Eddie Candle M.D.   On: 10/27/2019 16:36    ASSESSMENT & PLAN:   49 y.o. female with  1. Iron Deficiency Anemia - ? Previous heavy periods vs GI losses  Recent heavy NSAID use ? Ulcer  2. B12 deficiency Labs upon initial presentation from 04/16/18, HGB at 9.5. The 04/09/18 Ferritin was low at 6 and Vitamin B12 was at 196 -No antiparietal antibodies, no intrinsic factor antibodies, normal TSH all from 04/28/18  PLAN: -Continue NSAID avoidance  -continue 1062mg Vitamin B12 sublingually daily - B12 improved from 196 to 584  3.MetastaticAdenocarcinoma of cecum with liver mets. KRAS mutated.  04/16/18 CT A/P with pt revealed No acute findings in the abdomen/pelvis. Least 4 low-density hepatic lesions, some of which may represent small cysts or hemangiomas, however there is a 19 mm lesion in the left lobe of the liver that is incompletely characterized. Recommend nonemergent hepatic protocol MRI for further evaluation..Marland KitchenPossible 11 mm pancreatic nodule rising from the tail, alternatively this may represent a splenule. This can also be assessed at time of hepatic MRI. Gallstone without gallbladder inflammation. Colonic diverticulosis without diverticulitis.   07/09/18 Tumor  marker work up: CEA normal at 1.32, AFP normal at 1.5, LDH normal at 138, CA 125 normal at 5.9, CA 19-9 normal at <2   07/22/18 Liver biopsy results revealed benign hepatic tissue, and was not diagnostic   07/16/18 PET/CT revealedThere is a solid hypermetabolic mass involving the cecum, worrisome for colonic primary neoplasm. Correlation withcolonoscopy Findings. 2. Multiple hypermetabolic liver lesions compatible with metastaticdisease. 3. Large low-density lesion in right lobe of thyroid gland. Advise further evaluation with thyroid sonography. 4. Aortic Atherosclerosis. 5. Small hiatal hernia.   08/27/18 Liver biopsy revealed Metastatic adenocarcinoma, consistent with gastrointestinal primary 08/27/18 Molecular pathology- KRAS mutation positive   09/03/18 ECHO revealed an LV EF of 627-25% mild diastolic dysfunction; mild RVE; dilated PA; mild TR; moderate to severe pulmonary hypertension  09/17/18 CEA at 2.39, which was the last CEA pre-treatment value   12/13/18 CT A/P revealed Interval response to  therapy. There is been decrease in size of multifocal liver metastasis. No new or progressive findings. 2. Hiatal hernia.   Did postpone C6 by one week due to concern for mild infection.   Did postpone C7 by two weeks for infection  06/15/2019 CT C/A/P ([id number]) ([id number]) revealed "1. Significant interval enlargement of ill-defined, hypodense masses of the liver, the largest index mass of the inferior right lobe, segment VI, measuring 4.1 x 3.7 cm, previously 1.0 cm when measured similarly (series 2, image 58). There are additional lesions in caudate (series 2, image 54), lateral right lobe (series 2, image 52) and liver dome (series 2, image 39), all substantially enlarged compared to prior examination. Findings are consistent with worsened metastatic disease. 2. Redemonstrated residual mass at the cecal base appears thickened in comparison to prior examination, approximately 1.2 cm,  previously 5 mm when measured similarly (series 4, image 61, series 2, image 110). 3. Stable 2 mm nodules of the left lung base (series 6, image 105, 116), likely benign and incidental. No definite evidence of metastatic disease in the chest. Attention on follow-up."1. Significant interval enlargement of ill-defined, hypodense masses of the liver, the largest index mass of the inferior right lobe, segment VI, measuring 4.1 x 3.7 cm, previously 1.0 cm when measured similarly (series 2, image 58). There are additional lesions in caudate (series 2, image 54), lateral right lobe (series 2, image 52) and liver dome (series 2, image 39), all substantially enlarged compared to prior examination. Findings are consistent with worsened metastatic disease. 2. Redemonstrated residual mass at the cecal base appears thickened in comparison to prior examination, approximately 1.2 cm, previously 5 mm when measured similarly (series 4, image 61, series 2, image 110). 3. Stable 2 mm nodules of the left lung base (series 6, image 105, 116), likely benign and incidental. No definite evidence of metastatic disease in the chest. Attention on follow-up."  10/27/2019 CT Abd/Pel ([id number]) revealed fairly significant progression in the cecum and liver, stable pulmonary nodules   PLAN: -Discussed pt labwork today, 11/23/19; multifactorial anemia, other blood counts and chemistries are steady -Recommended that the pt continue to eat well, drink at least 48-64 oz of water each day, and walk as much as possible. -Advised pt that we will send out a recommendation for a bariatric walker to help with her mobility, as it is becoming limiting   -Discussed burden of care with pt. Advised pt that if she feels that she is becoming overwhelmed by treatment we will stop. Pt would like to continue at this time.  -The pt has no prohibitive toxicities from continuing with C6D1 of FOLFIRI. Okay to proceed. -Will consider adding Avastin with C7  if anemia is corrected by next visit -Will repeat CT after 3-4 cycles to determine treatment efficacy  -Will continue to watch CEA levels -Will give 2 units of PRBC in 2 days -Will check iron with next labs -Refill Compazine  -Will see back in 2 weeks with labs  FOLLOW UP: plzschedule with next 2 cycles of FOLFIRI + Avastin with port flush , labs and MD visit   The total time spent in the appt was 30 minutes and more than 50% was on counseling and direct patient cares.  All of the patient's questions were answered with apparent satisfaction. The patient knows to call the clinic with any problems, questions or concerns.    Sullivan Lone MD [place] AAHIVMS [facility] Hematology/Oncology Physician [place]  (Office):  [phone] (Work cell):  [phone] (Fax):           [phone]  11/23/2019 6:51 AM   I, Yevette Edwards, am acting as a scribe for Dr. Sullivan Lone.   .I have reviewed the above documentation for accuracy and completeness, and I agree with the above. Brunetta Genera MD

## 2019-11-24 ENCOUNTER — Other Ambulatory Visit: Payer: Self-pay | Admitting: *Deleted

## 2019-11-24 DIAGNOSIS — C189 Malignant neoplasm of colon, unspecified: Secondary | ICD-10-CM

## 2019-11-24 DIAGNOSIS — D509 Iron deficiency anemia, unspecified: Secondary | ICD-10-CM

## 2019-11-24 DIAGNOSIS — C787 Secondary malignant neoplasm of liver and intrahepatic bile duct: Secondary | ICD-10-CM

## 2019-11-24 LAB — PREPARE RBC (CROSSMATCH)

## 2019-11-25 ENCOUNTER — Inpatient Hospital Stay: Payer: Medicaid Other

## 2019-11-25 ENCOUNTER — Other Ambulatory Visit: Payer: Self-pay

## 2019-11-25 VITALS — BP 137/92 | HR 72 | Temp 98.3°F | Resp 16

## 2019-11-25 DIAGNOSIS — Z5189 Encounter for other specified aftercare: Secondary | ICD-10-CM | POA: Diagnosis not present

## 2019-11-25 DIAGNOSIS — D509 Iron deficiency anemia, unspecified: Secondary | ICD-10-CM

## 2019-11-25 DIAGNOSIS — Z7189 Other specified counseling: Secondary | ICD-10-CM

## 2019-11-25 DIAGNOSIS — C189 Malignant neoplasm of colon, unspecified: Secondary | ICD-10-CM

## 2019-11-25 MED ORDER — PEGFILGRASTIM-CBQV 6 MG/0.6ML ~~LOC~~ SOSY
6.0000 mg | PREFILLED_SYRINGE | Freq: Once | SUBCUTANEOUS | Status: AC
  Administered 2019-11-25: 6 mg via SUBCUTANEOUS

## 2019-11-25 MED ORDER — DIPHENHYDRAMINE HCL 25 MG PO CAPS
25.0000 mg | ORAL_CAPSULE | Freq: Once | ORAL | Status: AC
  Administered 2019-11-25: 25 mg via ORAL

## 2019-11-25 MED ORDER — DIPHENHYDRAMINE HCL 25 MG PO CAPS
ORAL_CAPSULE | ORAL | Status: AC
  Filled 2019-11-25: qty 1

## 2019-11-25 MED ORDER — SODIUM CHLORIDE 0.9% FLUSH
10.0000 mL | INTRAVENOUS | Status: DC | PRN
  Filled 2019-11-25: qty 10

## 2019-11-25 MED ORDER — ACETAMINOPHEN 325 MG PO TABS
ORAL_TABLET | ORAL | Status: AC
  Filled 2019-11-25: qty 2

## 2019-11-25 MED ORDER — ACETAMINOPHEN 325 MG PO TABS
650.0000 mg | ORAL_TABLET | Freq: Once | ORAL | Status: AC
  Administered 2019-11-25: 650 mg via ORAL

## 2019-11-25 MED ORDER — HEPARIN SOD (PORK) LOCK FLUSH 100 UNIT/ML IV SOLN
500.0000 [IU] | Freq: Once | INTRAVENOUS | Status: DC | PRN
  Filled 2019-11-25: qty 5

## 2019-11-25 MED ORDER — METHYLPREDNISOLONE SODIUM SUCC 40 MG IJ SOLR
40.0000 mg | Freq: Once | INTRAMUSCULAR | Status: AC
  Administered 2019-11-25: 40 mg via INTRAVENOUS

## 2019-11-25 MED ORDER — SODIUM CHLORIDE 0.9 % IV SOLN
INTRAVENOUS | Status: DC
  Filled 2019-11-25: qty 250

## 2019-11-25 MED ORDER — METHYLPREDNISOLONE SODIUM SUCC 40 MG IJ SOLR
INTRAMUSCULAR | Status: AC
  Filled 2019-11-25: qty 1

## 2019-11-25 MED ORDER — PEGFILGRASTIM-CBQV 6 MG/0.6ML ~~LOC~~ SOSY
PREFILLED_SYRINGE | SUBCUTANEOUS | Status: AC
  Filled 2019-11-25: qty 0.6

## 2019-11-25 NOTE — Patient Instructions (Signed)
Blood Transfusion, Adult, Care After This sheet gives you information about how to care for yourself after your procedure. Your doctor may also give you more specific instructions. If you have problems or questions, contact your doctor. What can I expect after the procedure? After the procedure, it is common to have:  Bruising and soreness at the IV site.  A fever or chills on the day of the procedure. This may be your body's response to the new blood cells received.  A headache. Follow these instructions at home: Insertion site care      Follow instructions from your doctor about how to take care of your insertion site. This is where an IV tube was put into your vein. Make sure you: ? Wash your hands with soap and water before and after you change your bandage (dressing). If you cannot use soap and water, use hand sanitizer. ? Change your bandage as told by your doctor.  Check your insertion site every day for signs of infection. Check for: ? Redness, swelling, or pain. ? Bleeding from the site. ? Warmth. ? Pus or a bad smell. General instructions  Take over-the-counter and prescription medicines only as told by your doctor.  Rest as told by your doctor.  Go back to your normal activities as told by your doctor.  Keep all follow-up visits as told by your doctor. This is important. Contact a doctor if:  You have itching or red, swollen areas of skin (hives).  You feel worried or nervous (anxious).  You feel weak after doing your normal activities.  You have redness, swelling, warmth, or pain around the insertion site.  You have blood coming from the insertion site, and the blood does not stop with pressure.  You have pus or a bad smell coming from the insertion site. Get help right away if:  You have signs of a serious reaction. This may be coming from an allergy or the body's defense system (immune system). Signs include: ? Trouble breathing or shortness of  breath. ? Swelling of the face or feeling warm (flushed). ? Fever or chills. ? Head, chest, or back pain. ? Dark pee (urine) or blood in the pee. ? Widespread rash. ? Fast heartbeat. ? Feeling dizzy or light-headed. You may receive your blood transfusion in an outpatient setting. If so, you will be told whom to contact to report any reactions. These symptoms may be an emergency. Do not wait to see if the symptoms will go away. Get medical help right away. Call your local emergency services (911 in the [place] Do not drive yourself to the hospital. Summary  Bruising and soreness at the IV site are common.  Check your insertion site every day for signs of infection.  Rest as told by your doctor. Go back to your normal activities as told by your doctor.  Get help right away if you have signs of a serious reaction. This information is not intended to replace advice given to you by your health care provider. Make sure you discuss any questions you have with your health care provider. Document Revised: 02/24/2019 Document Reviewed: 02/24/2019 Elsevier Patient Education  2020 [employer]   Pegfilgrastim injection What is this medicine? PEGFILGRASTIM (PEG fil gra stim) is a long-acting granulocyte colony-stimulating factor that stimulates the growth of neutrophils, a type of white blood cell important in the body's fight against infection. It is used to reduce the incidence of fever and infection in patients with certain types of cancer   who are receiving chemotherapy that affects the bone marrow, and to increase survival after being exposed to high doses of radiation. This medicine may be used for other purposes; ask your health care provider or pharmacist if you have questions. COMMON BRAND NAME(S): Fulphila, Neulasta, UDENYCA, Ziextenzo What should I tell my health care provider before I take this medicine? They need to know if you have any of these conditions:  kidney disease  latex  allergy  ongoing radiation therapy  sickle cell disease  skin reactions to acrylic adhesives (On-Body Injector only)  an unusual or allergic reaction to pegfilgrastim, filgrastim, other medicines, foods, dyes, or preservatives  pregnant or trying to get pregnant  breast-feeding How should I use this medicine? This medicine is for injection under the skin. If you get this medicine at home, you will be taught how to prepare and give the pre-filled syringe or how to use the On-body Injector. Refer to the patient Instructions for Use for detailed instructions. Use exactly as directed. Tell your healthcare provider immediately if you suspect that the On-body Injector may not have performed as intended or if you suspect the use of the On-body Injector resulted in a missed or partial dose. It is important that you put your used needles and syringes in a special sharps container. Do not put them in a trash can. If you do not have a sharps container, call your pharmacist or healthcare provider to get one. Talk to your pediatrician regarding the use of this medicine in children. While this drug may be prescribed for selected conditions, precautions do apply. Overdosage: If you think you have taken too much of this medicine contact a poison control center or emergency room at once. NOTE: This medicine is only for you. Do not share this medicine with others. What if I miss a dose? It is important not to miss your dose. Call your doctor or health care professional if you miss your dose. If you miss a dose due to an On-body Injector failure or leakage, a new dose should be administered as soon as possible using a single prefilled syringe for manual use. What may interact with this medicine? Interactions have not been studied. Give your health care provider a list of all the medicines, herbs, non-prescription drugs, or dietary supplements you use. Also tell them if you smoke, drink alcohol, or use illegal  drugs. Some items may interact with your medicine. This list may not describe all possible interactions. Give your health care provider a list of all the medicines, herbs, non-prescription drugs, or dietary supplements you use. Also tell them if you smoke, drink alcohol, or use illegal drugs. Some items may interact with your medicine. What should I watch for while using this medicine? You may need blood work done while you are taking this medicine. If you are going to need a MRI, CT scan, or other procedure, tell your doctor that you are using this medicine (On-Body Injector only). What side effects may I notice from receiving this medicine? Side effects that you should report to your doctor or health care professional as soon as possible:  allergic reactions like skin rash, itching or hives, swelling of the face, lips, or tongue  back pain  dizziness  fever  pain, redness, or irritation at site where injected  pinpoint red spots on the skin  red or dark-brown urine  shortness of breath or breathing problems  stomach or side pain, or pain at the shoulder  swelling  tiredness    trouble passing urine or change in the amount of urine Side effects that usually do not require medical attention (report to your doctor or health care professional if they continue or are bothersome):  bone pain  muscle pain This list may not describe all possible side effects. Call your doctor for medical advice about side effects. You may report side effects to FDA at 1-800-FDA-1088. Where should I keep my medicine? Keep out of the reach of children. If you are using this medicine at home, you will be instructed on how to store it. Throw away any unused medicine after the expiration date on the label. NOTE: This sheet is a summary. It may not cover all possible information. If you have questions about this medicine, talk to your doctor, pharmacist, or health care provider.  2020 Elsevier/Gold  Standard (2017-12-07 16:57:08)  

## 2019-11-27 LAB — BPAM RBC
Blood Product Expiration Date: 202103242359
Blood Product Expiration Date: 202104042359
ISSUE DATE / TIME: 202103121234
ISSUE DATE / TIME: 202103121234
Unit Type and Rh: 6200
Unit Type and Rh: 6200

## 2019-11-27 LAB — TYPE AND SCREEN
ABO/RH(D): A POS
Antibody Screen: NEGATIVE
Unit division: 0
Unit division: 0

## 2019-11-29 ENCOUNTER — Encounter: Payer: Self-pay | Admitting: Hematology

## 2019-11-29 ENCOUNTER — Other Ambulatory Visit: Payer: Self-pay | Admitting: Hematology

## 2019-11-29 ENCOUNTER — Telehealth: Payer: Self-pay | Admitting: *Deleted

## 2019-11-29 MED ORDER — DEXAMETHASONE 2 MG PO TABS: 2.0000 mg | ORAL_TABLET | Freq: Two times a day (BID) | ORAL | 1 refills | Status: DC

## 2019-11-29 NOTE — Telephone Encounter (Signed)
Called patient in response to MyChart message regarding lack of appetite and loss of desire to drink; Per Dr. Irene Limbo - Prescription sent for Dexamethasone 2mg  po 2 x daily with breakfast and lunch and could increase marinol to 5mg  po three times daily. Contacted patient with Dr. Grier Mitts instructions.Ms. Mowell verbalized understanding.

## 2019-11-30 ENCOUNTER — Other Ambulatory Visit: Payer: Self-pay | Admitting: Hematology

## 2019-11-30 MED ORDER — DIAZEPAM 5 MG PO TABS: 5.0000 mg | ORAL_TABLET | Freq: Every day | ORAL | 0 refills | Status: AC | PRN

## 2019-11-30 NOTE — Telephone Encounter (Signed)
Please review for refill.  

## 2019-12-02 ENCOUNTER — Encounter: Payer: Self-pay | Admitting: *Deleted

## 2019-12-02 ENCOUNTER — Telehealth: Payer: Self-pay | Admitting: Hematology

## 2019-12-02 NOTE — Telephone Encounter (Signed)
Scheduled apt per 3/16 sch message - left message for pt with appt date and time

## 2019-12-06 NOTE — Progress Notes (Signed)
HEMATOLOGY/ONCOLOGY CLINIC NOTE  Date of Service: 12/07/2019  Patient Care Team: Nolene Ebbs, MD as PCP - General (Internal Medicine) Jackelyn Knife, MD as Rounding Team (Internal Medicine)   CHIEF COMPLAINTS/PURPOSE OF CONSULTATION:   Continued mx of  Metastatic Adenocarcinoma of colon   HISTORY OF PRESENTING ILLNESS:   Margaret [name] is a wonderful 49 y.o. female who has been referred to [place] by Dr. Nolene Ebbs for evaluation and management of Iron deficiency anemia. The pt reports that she is doing well overall.   The pt recently presented to the ED on 04/16/18 for right sided abdominal pain that was evaluated with a CT A/P which revealed liver and pancreatic cyst, recommended for outpatient MRI follow up. The pt was found to have a UTI, was treated with Rocephin and discharged with Keflex. Prior to this the pt was admitted on 04/09/18 for symptomatic anemia, presenting with HGB at 6.2, received 2 units PRBCs, one IV Iron infusion, and was encouraged to seek out GI as outpatient.  She has an appointment with [facility] on 05/19/18.   The pt reports that she is still feeling dizzy and light headed and denies feeling better after her recent blood transfusion. She notes that she dizziness presents with a feeling of warmness and that she begins hyperventilating, feeling nervous about her dizziness.  She notes that prior to her recent hospital stay, she never required a blood transfusion or IV iron replacement. She notes that as a child she had frequent nose bleeds, and was diagnosed with anemia. She also notes a history of ice picca.   She had a hysterectomy in 2012, and prior to this she had very heavy periods that began at age 19 and occurred almost constantly. She was on Depo and Mirena which did not help slow menstrual losses. Besides taking iron supplements while pregnant she has not taken PO iron replacement as she reports not tolerating it very well while pregnant.   She  denies concern for black stools or blood in the stools and has not had a colonoscopy or endoscopy before. The pt notes that she has not previously had a concern for stomach ulcers, but has acid reflux and takes Prevacid as needed for 7-8 years. She notes that she has not recently used Prevacid, for the last 3 months.  She is not taking vitamin replacements and denies any dietary restrictions.  The pt notes that she has been continuing to have lower right quadrant abdominal pain that radiates across her abdomen, presents for up to 45 minutes and occurs intermittently. She notes that her abdominal pain presented on 04/13/18.   The pt notes that was taking Ibuprofen '800mg'$  BID for 8 years to address her back pain related to her herniated disk. She stopped taking this 4 months ago.   Of note prior to the patient's visit today, pt has had CT A/P completed on 04/16/18 with results revealing No acute findings in the abdomen/pelvis. Least 4 low-density hepatic lesions, some of which may represent small cysts or hemangiomas, however there is a 19 mm lesion in the left lobe of the liver that is incompletely characterized. Recommend nonemergent hepatic protocol MRI for further evaluation.Marland Kitchen Possible 11 mm pancreatic nodule rising from the tail, alternatively this may represent a splenule. This can also be assessed at time of hepatic MRI. Gallstone without gallbladder inflammation. Colonic diverticulosis without diverticulitis.   Most recent lab results (04/16/18) of CBC is as follows: all values are WNL except for HGB  at 9.5, HCT at 33.6, MCH at 23.8, MCHC at 28.3, RDW at 23.2, PLT at 435k. Ferritin 04/09/18 was low at 6 Vitamin B12 on 04/09/18 was at 196  On review of systems, pt reports light headedness, dizziness, lower back pain, intermittent abdominal pain, and denies black stools, blood in the stools, tingling or numbness in her legs or arms, vaginal bleeding, and any other symptoms.   INTERVAL HISTORY:   Margaret  TEJAH [name] returns today for management, evaluation of her Metastatic Adenocarcinoma of GI primary. She is here for C7D1 of FOLFIRI+ Avastrin. The patient's last visit with [place] was on 11/23/2019. The pt reports that she is doing well overall.  The pt reports that she is still SOB with movement, but that it has improved after the blood transfusion. She is eating better since beginning to use Dexamethasone and has continued using Marinol. She is now eating 2-3 meals per day. Pt has not had any nausea, vomiting, or diarrhea in the interim. Pt notes that she has a throbbing in the back of her head that is in sync with her heartbeat. She believes that she can feel her food pass through her body.   Her daughter is doing well but her son is still having difficulty dealing with her prognosis. He is going to therapy and working with [facility] at this time.   Lab results today (12/07/19) of CBC w/diff and CMP is as follows: all values are WNL except for WBC at 12.2K, RBC at 3.47, Hgb at 9.3, HCT at 31.7, MCHC at 29.3, RDW at 18.6, Abs Immature Granulocytes at 0.30K, Chloride at 112, Calcium at 8.8, Albumin at 2.5, ALP at 129. 12/07/2019 CEA at 10.57 down from about 15 12/07/2019 Ferritin at 716 12/07/2019 Iron and TIBC is as follows: Iron at 40, TIBC at 199, Sat Ratios at 20, UIBC at 159  On review of systems, pt reports SOB, improved appetite, right knee pain, head throbbing and denies nausea, vomiting, diarrhea, abdominal pain and any other symptoms.    MEDICAL HISTORY:  Past Medical History:  Diagnosis Date  . Allergic rhinitis   . Anxiety diagnosed in 1990  . Arthritis   . Asthma   . colon ca with liver mets dx'd 08/2018  . Dyspnea    with low blood count hx of anemia  . Eczema   . History of blood transfusion   . Hypertension   . Lower back pain   . Migraine   . Sleep apnea    uses CPAP    SURGICAL HISTORY: Past Surgical History:  Procedure Laterality Date  . ABDOMINAL HYSTERECTOMY      . cortisone injections     knees bilat and back   . IR IMAGING GUIDED PORT INSERTION  09/14/2018  . iron infusion    . LIVER BIOPSY      SOCIAL HISTORY: Social History   Socioeconomic History  . Marital status: Single    Spouse name: Not on file  . Number of children: Not on file  . Years of education: Not on file  . Highest education level: Not on file  Occupational History  . Not on file  Tobacco Use  . Smoking status: Former Smoker    Quit date: 07/30/1999    Years since quitting: 20.3  . Smokeless tobacco: Never Used  Substance and Sexual Activity  . Alcohol use: No  . Drug use: No  . Sexual activity: Not on file  Other Topics Concern  . Not  on file  Social History Narrative  . Not on file   Social Determinants of Health   Financial Resource Strain:   . Difficulty of Paying Living Expenses:   Food Insecurity:   . Worried About Charity fundraiser in the Last Year:   . Arboriculturist in the Last Year:   Transportation Needs:   . Film/video editor (Medical):   Marland Kitchen Lack of Transportation (Non-Medical):   Physical Activity:   . Days of Exercise per Week:   . Minutes of Exercise per Session:   Stress:   . Feeling of Stress :   Social Connections:   . Frequency of Communication with Friends and Family:   . Frequency of Social Gatherings with Friends and Family:   . Attends Religious Services:   . Active Member of Clubs or Organizations:   . Attends Archivist Meetings:   Marland Kitchen Marital Status:   Intimate Partner Violence:   . Fear of Current or Ex-Partner:   . Emotionally Abused:   Marland Kitchen Physically Abused:   . Sexually Abused:     FAMILY HISTORY: Family History  Problem Relation Age of Onset  . Diabetes Father     ALLERGIES:  is allergic to latex; tape; and tylenol with codeine #3 [acetaminophen-codeine].  MEDICATIONS:  Current Outpatient Medications  Medication Sig Dispense Refill  . albuterol (PROAIR HFA) 108 (90 Base) MCG/ACT inhaler  INHALE TWO puffs BY MOUTH into the lungs EVERY 4 HOURS AS NEEDED FOR WHEEZING OR shortness of breath 18 g 1  . Boric Acid 4 % SOLN Place 1 capsule vaginally every Friday.     . budesonide-formoterol (SYMBICORT) 160-4.5 MCG/ACT inhaler Inhale 2 puffs into the lungs 2 (two) times a day. 10.2 g 5  . cetirizine (ZYRTEC) 10 MG tablet Take 1 tablet (10 mg total) by mouth daily. 30 tablet 5  . clobetasol ointment (TEMOVATE) 5.36 % Apply 1 application topically 2 (two) times daily. Use for Lichen Sclerosis    . Cyanocobalamin (B-12) 1000 MCG SUBL Place 1,000 mcg under the tongue daily. 30 each 3  . dexamethasone (DECADRON) 4 MG tablet TAKE 2 TABLETS (8 MG TOTAL) BY MOUTH DAILY  START THE DAY AFTER CHEMOTHERAPY FOR 2 DAYS. TAKE WITH FOOD. 30 tablet 1  . diazepam (VALIUM) 5 MG tablet Take 1 tablet (5 mg total) by mouth daily as needed for anxiety (panic attacks). 30 tablet 0  . diclofenac sodium (VOLTAREN) 1 % GEL Apply 2 g topically 4 (four) times daily as needed (for back/knee pain.).     Marland Kitchen dicyclomine (BENTYL) 20 MG tablet Take 1 tablet (20 mg total) by mouth 2 (two) times daily. (Patient taking differently: Take 20 mg by mouth 3 (three) times daily before meals. ) 20 tablet 0  . dronabinol (MARINOL) 5 MG capsule Take 1 capsule (5 mg total) by mouth 2 (two) times daily before a meal. 60 capsule 0  . ergocalciferol (VITAMIN D2) 50000 UNITS capsule Take 50,000 Units by mouth every Monday.     . lidocaine-prilocaine (EMLA) cream Apply to affected area once 30 g 3  . lisinopril-hydrochlorothiazide (PRINZIDE,ZESTORETIC) 20-12.5 MG tablet Take 1 tablet by mouth daily.    . methocarbamol (ROBAXIN) 750 MG tablet Take 750 mg by mouth every 8 (eight) hours as needed (back spasms.).     Marland Kitchen montelukast (SINGULAIR) 10 MG tablet Take 1 tablet (10 mg total) by mouth at bedtime. 30 tablet 6  . nystatin-triamcinolone (MYCOLOG II) cream Apply 1 application  topically 2 (two) times daily as needed (irritation). Use in skin  folds and under breast     . Olopatadine HCl (PAZEO) 0.7 % SOLN Apply 1 drop to eye daily. 2.5 mL 5  . ondansetron (ZOFRAN) 8 MG tablet Take 1 tablet (8 mg total) by mouth 2 (two) times daily as needed for refractory nausea / vomiting. Start on day 3 after chemotherapy. 30 tablet 0  . pantoprazole (PROTONIX) 40 MG tablet Take 1 tablet (40 mg total) by mouth daily before breakfast. 30 tablet 1  . pimecrolimus (ELIDEL) 1 % cream Apply 1 application topically 2 (two) times daily. 30 g 6  . prochlorperazine (COMPAZINE) 10 MG tablet Take 1 tablet (10 mg total) by mouth every 6 (six) hours as needed (Nausea or vomiting). 30 tablet 1  . saccharomyces boulardii (FLORASTOR) 250 MG capsule Take 1 capsule (250 mg total) by mouth 2 (two) times daily.    . traMADol (ULTRAM) 50 MG tablet Take 1-2 tablets (50-100 mg total) by mouth every 6 (six) hours as needed for moderate pain or severe pain. 90 tablet 0  . triamcinolone (NASACORT) 55 MCG/ACT AERO nasal inhaler Place 2 sprays into the nose daily as needed (for allergies). 1 Inhaler 5  . valACYclovir (VALTREX) 500 MG tablet Take 500 mg by mouth 2 (two) times daily as needed (for cold sores).      No current facility-administered medications for this visit.    Facility-Administered Medications Ordered in Other Visits  Medication Dose Route Frequency Provider Last Rate Last Dose  . fluorouracil (ADRUCIL) 6,950 mg in sodium chloride 0.9 % 111 mL chemo infusion  2,400 mg/m2 (Treatment Plan Recorded) Intravenous 1 day or 1 dose Brunetta Genera, MD      . leucovorin 1,156 mg in dextrose 5 % 250 mL infusion  400 mg/m2 (Treatment Plan Recorded) Intravenous Once Brunetta Genera, MD 77 mL/hr at 05/24/19 1242 1,156 mg at 05/24/19 1242  . oxaliplatin (ELOXATIN) 250 mg in dextrose 5 % 500 mL chemo infusion  250 mg Intravenous Once Brunetta Genera, MD 138 mL/hr at 05/24/19 1245 250 mg at 05/24/19 1245    REVIEW OF SYSTEMS:   A 10+ POINT REVIEW OF SYSTEMS WAS  OBTAINED including neurology, dermatology, psychiatry, cardiac, respiratory, lymph, extremities, GI, GU, Musculoskeletal, constitutional, breasts, reproductive, HEENT.  All pertinent positives are noted in the HPI.  All others are negative.   PHYSICAL EXAMINATION:  ECOG FS:2 - Symptomatic, <50% confined to bed  There were no vitals filed for this visit. Wt Readings from Last 3 Encounters:  12/07/19 (!) 377 lb 8 oz (171.2 kg)  11/23/19 (!) 364 lb 4 oz (165.2 kg)  10/20/19 (!) 402 lb 12.8 oz (182.7 kg)   There is no height or weight on file to calculate BMI.    Exam was given in bed  GENERAL:alert, in no acute distress and comfortable SKIN: no acute rashes, no significant lesions EYES: conjunctiva are pink and non-injected, sclera anicteric OROPHARYNX: MMM, no exudates, no oropharyngeal erythema or ulceration NECK: supple, no JVD LYMPH:  no palpable lymphadenopathy in the cervical, axillary or inguinal regions LUNGS: clear to auscultation b/l with normal respiratory effort HEART: regular rate & rhythm ABDOMEN:  normoactive bowel sounds , non tender, not distended. No palpable hepatosplenomegaly.  Extremity: trace pedal edema b/l PSYCH: alert & oriented x 3 with fluent speech NEURO: no focal motor/sensory deficits   LABORATORY DATA:  I have reviewed the data as listed  . CBC Latest Ref  Rng & Units 12/07/2019 11/23/2019 11/09/2019  WBC 4.0 - 10.5 K/uL 12.2(H) 8.3 10.2  Hemoglobin 12.0 - 15.0 g/dL 9.3(L) 7.9(L) 8.8(L)  Hematocrit 36.0 - 46.0 % 31.7(L) 27.8(L) 30.3(L)  Platelets 150 - 400 K/uL 355 307 393   ANC 900 . CMP Latest Ref Rng & Units 12/07/2019 11/23/2019 11/09/2019  Glucose 70 - 99 mg/dL 96 93 112(H)  BUN 6 - 20 mg/dL '14 10 19  '$ Creatinine 0.44 - 1.00 mg/dL 0.96 0.92 1.04(H)  Sodium 135 - 145 mmol/L 143 144 142  Potassium 3.5 - 5.1 mmol/L 3.6 3.6 3.8  Chloride 98 - 111 mmol/L 112(H) 111 109  CO2 22 - 32 mmol/L '25 24 22  '$ Calcium 8.9 - 10.3 mg/dL 8.8(L) 8.7(L) 8.8(L)    Total Protein 6.5 - 8.1 g/dL 7.1 7.0 7.7  Total Bilirubin 0.3 - 1.2 mg/dL 0.3 0.4 0.5  Alkaline Phos 38 - 126 U/L 129(H) 138(H) 146(H)  AST 15 - 41 U/L 21 27 34  ALT 0 - 44 U/L '13 16 25   '$ . Lab Results  Component Value Date   IRON 40 (L) 12/07/2019   TIBC 199 (L) 12/07/2019   IRONPCTSAT 20 (L) 12/07/2019   (Iron and TIBC)  Lab Results  Component Value Date   FERRITIN 716 (H) 12/07/2019   B12  - 196--> 584  07/22/18 Liver Biopsy:   08/27/18 Repeat Liver Biopsy:   Molecular Pathology:     RADIOGRAPHIC STUDIES: I have personally reviewed the radiological images as listed and agreed with the findings in the report. No results found.  ASSESSMENT & PLAN:   49 y.o. female with  1. Iron Deficiency Anemia - ? Previous heavy periods vs GI losses  Recent heavy NSAID use ? Ulcer  2. B12 deficiency Labs upon initial presentation from 04/16/18, HGB at 9.5. The 04/09/18 Ferritin was low at 6 and Vitamin B12 was at 196 -No antiparietal antibodies, no intrinsic factor antibodies, normal TSH all from 04/28/18  PLAN: -Continue NSAID avoidance  -continue 1086mg Vitamin B12 sublingually daily - B12 improved from 196 to 584  3.MetastaticAdenocarcinoma of cecum with liver mets. KRAS mutated.  04/16/18 CT A/P with pt revealed No acute findings in the abdomen/pelvis. Least 4 low-density hepatic lesions, some of which may represent small cysts or hemangiomas, however there is a 19 mm lesion in the left lobe of the liver that is incompletely characterized. Recommend nonemergent hepatic protocol MRI for further evaluation..Marland KitchenPossible 11 mm pancreatic nodule rising from the tail, alternatively this may represent a splenule. This can also be assessed at time of hepatic MRI. Gallstone without gallbladder inflammation. Colonic diverticulosis without diverticulitis.   07/09/18 Tumor marker work up: CEA normal at 1.32, AFP normal at 1.5, LDH normal at 138, CA 125 normal at 5.9, CA 19-9 normal  at <2   07/22/18 Liver biopsy results revealed benign hepatic tissue, and was not diagnostic   07/16/18 PET/CT revealedThere is a solid hypermetabolic mass involving the cecum, worrisome for colonic primary neoplasm. Correlation withcolonoscopy Findings. 2. Multiple hypermetabolic liver lesions compatible with metastaticdisease. 3. Large low-density lesion in right lobe of thyroid gland. Advise further evaluation with thyroid sonography. 4. Aortic Atherosclerosis. 5. Small hiatal hernia.   08/27/18 Liver biopsy revealed Metastatic adenocarcinoma, consistent with gastrointestinal primary 08/27/18 Molecular pathology- KRAS mutation positive   09/03/18 ECHO revealed an LV EF of 625-63% mild diastolic dysfunction; mild RVE; dilated PA; mild TR; moderate to severe pulmonary hypertension  09/17/18 CEA at 2.39, which was the last CEA  pre-treatment value   12/13/18 CT A/P revealed Interval response to therapy. There is been decrease in size of multifocal liver metastasis. No new or progressive findings. 2. Hiatal hernia.   Did postpone C6 by one week due to concern for mild infection.   Did postpone C7 by two weeks for infection  06/15/2019 CT C/A/P ([id number]) ([id number]) revealed "1. Significant interval enlargement of ill-defined, hypodense masses of the liver, the largest index mass of the inferior right lobe, segment VI, measuring 4.1 x 3.7 cm, previously 1.0 cm when measured similarly (series 2, image 58). There are additional lesions in caudate (series 2, image 54), lateral right lobe (series 2, image 52) and liver dome (series 2, image 39), all substantially enlarged compared to prior examination. Findings are consistent with worsened metastatic disease. 2. Redemonstrated residual mass at the cecal base appears thickened in comparison to prior examination, approximately 1.2 cm, previously 5 mm when measured similarly (series 4, image 61, series 2, image 110). 3. Stable 2 mm nodules of the left  lung base (series 6, image 105, 116), likely benign and incidental. No definite evidence of metastatic disease in the chest. Attention on follow-up."1. Significant interval enlargement of ill-defined, hypodense masses of the liver, the largest index mass of the inferior right lobe, segment VI, measuring 4.1 x 3.7 cm, previously 1.0 cm when measured similarly (series 2, image 58). There are additional lesions in caudate (series 2, image 54), lateral right lobe (series 2, image 52) and liver dome (series 2, image 39), all substantially enlarged compared to prior examination. Findings are consistent with worsened metastatic disease. 2. Redemonstrated residual mass at the cecal base appears thickened in comparison to prior examination, approximately 1.2 cm, previously 5 mm when measured similarly (series 4, image 61, series 2, image 110). 3. Stable 2 mm nodules of the left lung base (series 6, image 105, 116), likely benign and incidental. No definite evidence of metastatic disease in the chest. Attention on follow-up."  10/27/2019 CT Abd/Pel ([id number]) revealed fairly significant progression in the cecum and liver, stable pulmonary nodules   PLAN: -Discussed pt labwork today, 12/07/19; Ferritin is well replaced, CEA is down, Hgb is improved post-transfusion, other blood counts and chemistries are stable  -The pt has no prohibitive toxicities from continuing with C7D1 of FOLFIRI. Will add Avastin with C7.  -Will watch BP with Avastin. Recommend pt restart Lisinopril.  -Recommend pt monitor her BP in the mornings and contact us if systolic BP begins to climb >160 -Advised pt that her head throbbing is likely caused by her anemia  -Will continue to watch CEA levels -Orders reviewed and signed  -Will see back in 2 weeks with labs and next treatmen  FOLLOW UP: F/u as per her next scheduled appointments on 12/21/2019 for labs, portflush, MD visit and next cycle of chemotherapy   The total time spent in the  appt was 20 minutes and more than 50% was on counseling and direct patient cares.  All of the patient's questions were answered with apparent satisfaction. The patient knows to call the clinic with any problems, questions or concerns.    Sullivan Lone MD [place] AAHIVMS [facility] Hematology/Oncology Physician [facility]  (Office):       [phone] (Work cell):  [phone] (Fax):           [phone]  12/07/2019 4:57 PM   I, Yevette Edwards, am acting as a scribe for Dr. Sullivan Lone.   .I have reviewed the above documentation for  accuracy and completeness, and I agree with the above. Brunetta Genera MD

## 2019-12-07 ENCOUNTER — Inpatient Hospital Stay: Payer: Medicaid Other

## 2019-12-07 ENCOUNTER — Other Ambulatory Visit: Payer: Self-pay

## 2019-12-07 ENCOUNTER — Other Ambulatory Visit: Payer: Self-pay | Admitting: *Deleted

## 2019-12-07 ENCOUNTER — Ambulatory Visit (HOSPITAL_BASED_OUTPATIENT_CLINIC_OR_DEPARTMENT_OTHER): Payer: Medicaid Other | Admitting: Medical

## 2019-12-07 ENCOUNTER — Inpatient Hospital Stay (HOSPITAL_BASED_OUTPATIENT_CLINIC_OR_DEPARTMENT_OTHER): Payer: Medicaid Other | Admitting: Hematology

## 2019-12-07 ENCOUNTER — Other Ambulatory Visit: Payer: Self-pay | Admitting: Hematology

## 2019-12-07 VITALS — BP 155/89 | HR 97 | Temp 98.3°F | Resp 20 | Wt 377.5 lb

## 2019-12-07 DIAGNOSIS — C189 Malignant neoplasm of colon, unspecified: Secondary | ICD-10-CM

## 2019-12-07 DIAGNOSIS — C787 Secondary malignant neoplasm of liver and intrahepatic bile duct: Secondary | ICD-10-CM

## 2019-12-07 DIAGNOSIS — Z5111 Encounter for antineoplastic chemotherapy: Secondary | ICD-10-CM

## 2019-12-07 DIAGNOSIS — Z95828 Presence of other vascular implants and grafts: Secondary | ICD-10-CM

## 2019-12-07 DIAGNOSIS — Z7189 Other specified counseling: Secondary | ICD-10-CM

## 2019-12-07 DIAGNOSIS — Z5189 Encounter for other specified aftercare: Secondary | ICD-10-CM | POA: Diagnosis not present

## 2019-12-07 DIAGNOSIS — R110 Nausea: Secondary | ICD-10-CM

## 2019-12-07 DIAGNOSIS — D509 Iron deficiency anemia, unspecified: Secondary | ICD-10-CM

## 2019-12-07 LAB — CMP (CANCER CENTER ONLY)
ALT: 13 U/L (ref 0–44)
AST: 21 U/L (ref 15–41)
Albumin: 2.5 g/dL — ABNORMAL LOW (ref 3.5–5.0)
Alkaline Phosphatase: 129 U/L — ABNORMAL HIGH (ref 38–126)
Anion gap: 6 (ref 5–15)
BUN: 14 mg/dL (ref 6–20)
CO2: 25 mmol/L (ref 22–32)
Calcium: 8.8 mg/dL — ABNORMAL LOW (ref 8.9–10.3)
Chloride: 112 mmol/L — ABNORMAL HIGH (ref 98–111)
Creatinine: 0.96 mg/dL (ref 0.44–1.00)
GFR, Est AFR Am: >60 mL/min (ref >60)
GFR, Estimated: >60 mL/min (ref >60)
Glucose, Bld: 96 mg/dL (ref 70–99)
Potassium: 3.6 mmol/L (ref 3.5–5.1)
Sodium: 143 mmol/L (ref 135–145)
Total Bilirubin: 0.3 mg/dL (ref 0.3–1.2)
Total Protein: 7.1 g/dL (ref 6.5–8.1)

## 2019-12-07 LAB — CBC WITH DIFFERENTIAL/PLATELET
Abs Immature Granulocytes: 0.3 10*3/uL — ABNORMAL HIGH (ref 0.00–0.07)
Basophils Absolute: 0.1 10*3/uL (ref 0.0–0.1)
Basophils Relative: 1 %
Eosinophils Absolute: 0 10*3/uL (ref 0.0–0.5)
Eosinophils Relative: 0 %
HCT: 31.7 % — ABNORMAL LOW (ref 36.0–46.0)
Hemoglobin: 9.3 g/dL — ABNORMAL LOW (ref 12.0–15.0)
Immature Granulocytes: 3 %
Lymphocytes Relative: 26 %
Lymphs Abs: 3.2 10*3/uL (ref 0.7–4.0)
MCH: 26.8 pg (ref 26.0–34.0)
MCHC: 29.3 g/dL — ABNORMAL LOW (ref 30.0–36.0)
MCV: 91.4 fL (ref 80.0–100.0)
Monocytes Absolute: 1 10*3/uL (ref 0.1–1.0)
Monocytes Relative: 9 %
Neutro Abs: 7.6 10*3/uL (ref 1.7–7.7)
Neutrophils Relative %: 61 %
Platelets: 355 10*3/uL (ref 150–400)
RBC: 3.47 MIL/uL — ABNORMAL LOW (ref 3.87–5.11)
RDW: 18.6 % — ABNORMAL HIGH (ref 11.5–15.5)
WBC: 12.2 10*3/uL — ABNORMAL HIGH (ref 4.0–10.5)
nRBC: 0 % (ref 0.0–0.2)

## 2019-12-07 LAB — IRON AND TIBC
Iron: 40 ug/dL — ABNORMAL LOW (ref 41–142)
Saturation Ratios: 20 % — ABNORMAL LOW (ref 21–57)
TIBC: 199 ug/dL — ABNORMAL LOW (ref 236–444)
UIBC: 159 ug/dL (ref 120–384)

## 2019-12-07 LAB — FERRITIN: Ferritin: 716 ng/mL — ABNORMAL HIGH (ref 11–307)

## 2019-12-07 LAB — TOTAL PROTEIN, URINE DIPSTICK: Protein, ur: NEGATIVE mg/dL

## 2019-12-07 LAB — CEA (IN HOUSE-CHCC): CEA (CHCC-In House): 10.57 ng/mL — ABNORMAL HIGH (ref 0.00–5.00)

## 2019-12-07 MED ORDER — SODIUM CHLORIDE 0.9 % IV SOLN
2400.0000 mg/m2 | INTRAVENOUS | Status: DC
  Administered 2019-12-07: 7200 mg via INTRAVENOUS
  Filled 2019-12-07: qty 144

## 2019-12-07 MED ORDER — SODIUM CHLORIDE 0.9 % IV SOLN
Freq: Once | INTRAVENOUS | Status: AC
  Filled 2019-12-07: qty 250

## 2019-12-07 MED ORDER — DEXAMETHASONE SODIUM PHOSPHATE 10 MG/ML IJ SOLN
INTRAMUSCULAR | Status: AC
  Filled 2019-12-07: qty 1

## 2019-12-07 MED ORDER — SODIUM CHLORIDE 0.9 % IV SOLN
5.0000 mg/kg | Freq: Once | INTRAVENOUS | Status: AC
  Administered 2019-12-07: 900 mg via INTRAVENOUS
  Filled 2019-12-07: qty 32

## 2019-12-07 MED ORDER — DEXAMETHASONE SODIUM PHOSPHATE 10 MG/ML IJ SOLN
10.0000 mg | Freq: Once | INTRAMUSCULAR | Status: AC
  Administered 2019-12-07: 10 mg via INTRAVENOUS

## 2019-12-07 MED ORDER — PALONOSETRON HCL INJECTION 0.25 MG/5ML
0.2500 mg | Freq: Once | INTRAVENOUS | Status: AC
  Administered 2019-12-07: 0.25 mg via INTRAVENOUS

## 2019-12-07 MED ORDER — SODIUM CHLORIDE 0.9% FLUSH
10.0000 mL | INTRAVENOUS | Status: DC | PRN
  Filled 2019-12-07: qty 10

## 2019-12-07 MED ORDER — ATROPINE SULFATE 0.4 MG/ML IJ SOLN
0.4000 mg | Freq: Once | INTRAMUSCULAR | Status: AC
  Administered 2019-12-07: 0.4 mg via INTRAVENOUS

## 2019-12-07 MED ORDER — ATROPINE SULFATE 0.4 MG/ML IJ SOLN
INTRAMUSCULAR | Status: AC
  Filled 2019-12-07: qty 1

## 2019-12-07 MED ORDER — PALONOSETRON HCL INJECTION 0.25 MG/5ML
INTRAVENOUS | Status: AC
  Filled 2019-12-07: qty 5

## 2019-12-07 MED ORDER — HEPARIN SOD (PORK) LOCK FLUSH 100 UNIT/ML IV SOLN
500.0000 [IU] | Freq: Once | INTRAVENOUS | Status: DC | PRN
  Filled 2019-12-07: qty 5

## 2019-12-07 MED ORDER — LEUCOVORIN CALCIUM INJECTION 350 MG
400.0000 mg/m2 | Freq: Once | INTRAVENOUS | Status: AC
  Administered 2019-12-07: 1200 mg via INTRAVENOUS
  Filled 2019-12-07: qty 60

## 2019-12-07 MED ORDER — IRINOTECAN HCL CHEMO INJECTION 100 MG/5ML
180.0000 mg/m2 | Freq: Once | INTRAVENOUS | Status: AC
  Administered 2019-12-07: 540 mg via INTRAVENOUS
  Filled 2019-12-07: qty 27

## 2019-12-07 MED ORDER — LORAZEPAM 2 MG/ML IJ SOLN
INTRAMUSCULAR | Status: AC
  Filled 2019-12-07: qty 1

## 2019-12-07 MED ORDER — SODIUM CHLORIDE 0.9% FLUSH
10.0000 mL | Freq: Once | INTRAVENOUS | Status: AC
  Administered 2019-12-07: 10 mL
  Filled 2019-12-07: qty 10

## 2019-12-07 MED ORDER — LORAZEPAM 2 MG/ML IJ SOLN
0.5000 mg | Freq: Once | INTRAMUSCULAR | Status: AC
  Administered 2019-12-07: 0.5 mg via INTRAVENOUS

## 2019-12-07 NOTE — Patient Instructions (Signed)
[facility] Discharge Instructions for Patients Receiving Chemotherapy  Today you received the following chemotherapy agents: Irinotecan, Leucovorin, Avastin, 5FU pump   To help prevent nausea and vomiting after your treatment, we encourage you to take your nausea medication as directed.    If you develop nausea and vomiting that is not controlled by your nausea medication, call the clinic.   BELOW ARE SYMPTOMS THAT SHOULD BE REPORTED IMMEDIATELY:  *FEVER GREATER THAN 100.5 F  *CHILLS WITH OR WITHOUT FEVER  NAUSEA AND VOMITING THAT IS NOT CONTROLLED WITH YOUR NAUSEA MEDICATION  *UNUSUAL SHORTNESS OF BREATH  *UNUSUAL BRUISING OR BLEEDING  TENDERNESS IN MOUTH AND THROAT WITH OR WITHOUT PRESENCE OF ULCERS  *URINARY PROBLEMS  *BOWEL PROBLEMS  UNUSUAL RASH Items with * indicate a potential emergency and should be followed up as soon as possible.  Feel free to call the clinic should you have any questions or concerns. The clinic phone number is [phone].  Please show the [place] at check-in to the Emergency Department and triage nurse.

## 2019-12-07 NOTE — Progress Notes (Signed)
Per Dr. Irene Limbo, Avastin will be re-started today. Urine protein collected.    Per Carolann Littler in pharmacy, Avastin can be administered after chemotherapy.   After completion of irinotecan/leucovorin infusion, pt began to vomit and requested medication. Sandi Mealy, PA notified for orders. One time order given for Ativan 0.5mg  IV. Ativan administered, and pt voiced improvement of symptoms within minutes of administration.

## 2019-12-08 ENCOUNTER — Telehealth: Payer: Self-pay | Admitting: Medical

## 2019-12-08 NOTE — Telephone Encounter (Signed)
Added appt on 3/24 with tanner per 3/24 sch msg

## 2019-12-09 ENCOUNTER — Other Ambulatory Visit: Payer: Self-pay

## 2019-12-09 ENCOUNTER — Inpatient Hospital Stay: Payer: Medicaid Other

## 2019-12-09 VITALS — BP 150/86 | HR 77 | Temp 98.2°F | Resp 20

## 2019-12-09 DIAGNOSIS — Z5189 Encounter for other specified aftercare: Secondary | ICD-10-CM | POA: Diagnosis not present

## 2019-12-09 DIAGNOSIS — C189 Malignant neoplasm of colon, unspecified: Secondary | ICD-10-CM

## 2019-12-09 DIAGNOSIS — Z7189 Other specified counseling: Secondary | ICD-10-CM

## 2019-12-09 MED ORDER — PEGFILGRASTIM-CBQV 6 MG/0.6ML ~~LOC~~ SOSY
PREFILLED_SYRINGE | SUBCUTANEOUS | Status: AC
  Filled 2019-12-09: qty 0.6

## 2019-12-09 MED ORDER — SODIUM CHLORIDE 0.9% FLUSH
10.0000 mL | INTRAVENOUS | Status: DC | PRN
  Administered 2019-12-09: 10 mL
  Filled 2019-12-09: qty 10

## 2019-12-09 MED ORDER — HEPARIN SOD (PORK) LOCK FLUSH 100 UNIT/ML IV SOLN
500.0000 [IU] | Freq: Once | INTRAVENOUS | Status: AC | PRN
  Administered 2019-12-09: 500 [IU]
  Filled 2019-12-09: qty 5

## 2019-12-09 MED ORDER — PEGFILGRASTIM-CBQV 6 MG/0.6ML ~~LOC~~ SOSY
6.0000 mg | PREFILLED_SYRINGE | Freq: Once | SUBCUTANEOUS | Status: AC
  Administered 2019-12-09: 6 mg via SUBCUTANEOUS

## 2019-12-09 NOTE — Progress Notes (Signed)
This patient was seen in infusion. She was receiving irinotecan and was having nausea despite being dosed with Aloxi, dexamethasone, and Emend. She was given Ativan 0.5 mg IV x 1 with resolution of her nausea.  Sandi Mealy, MHS, PA-C Physician Assistant

## 2019-12-15 NOTE — Progress Notes (Signed)
Pharmacist Chemotherapy Monitoring - Follow Up Assessment    I verify that I have reviewed each item in the below checklist:  . Regimen for the patient is scheduled for the appropriate day and plan matches scheduled date. Marland Kitchen Appropriate non-routine labs are ordered dependent on drug ordered. . If applicable, additional medications reviewed and ordered per protocol based on lifetime cumulative doses and/or treatment regimen.   Plan for follow-up and/or issues identified: No . I-vent associated with next due treatment: No . MD and/or nursing notified: No   Kennith Center, Pharm.D., CPP 12/15/2019@12 :17 PM

## 2019-12-15 NOTE — Progress Notes (Signed)
The following biosimilar Zirabev (bevacizumab-bvzr) has been selected for use in this patient. Medicaid pt.  No PA req'd  Kennith Center, Pharm.D., CPP 12/15/2019@12 :39 PM

## 2019-12-20 NOTE — Progress Notes (Signed)
HEMATOLOGY/ONCOLOGY CLINIC NOTE  Date of Service: 12/21/2019  Patient Care Team: Nolene Ebbs, MD as PCP - General (Internal Medicine) Jackelyn Knife, MD as Rounding Team (Internal Medicine)   CHIEF COMPLAINTS/PURPOSE OF CONSULTATION:   Continued mx of  Metastatic Adenocarcinoma of colon   HISTORY OF PRESENTING ILLNESS:   Margaret [name] is a wonderful 49 y.o. female who has been referred to [place] by Dr. Nolene Ebbs for evaluation and management of Iron deficiency anemia. The pt reports that she is doing well overall.   The pt recently presented to the ED on 04/16/18 for right sided abdominal pain that was evaluated with a CT A/P which revealed liver and pancreatic cyst, recommended for outpatient MRI follow up. The pt was found to have a UTI, was treated with Rocephin and discharged with Keflex. Prior to this the pt was admitted on 04/09/18 for symptomatic anemia, presenting with HGB at 6.2, received 2 units PRBCs, one IV Iron infusion, and was encouraged to seek out GI as outpatient.  She has an appointment with [facility] on 05/19/18.   The pt reports that she is still feeling dizzy and light headed and denies feeling better after her recent blood transfusion. She notes that she dizziness presents with a feeling of warmness and that she begins hyperventilating, feeling nervous about her dizziness.  She notes that prior to her recent hospital stay, she never required a blood transfusion or IV iron replacement. She notes that as a child she had frequent nose bleeds, and was diagnosed with anemia. She also notes a history of ice picca.   She had a hysterectomy in 2012, and prior to this she had very heavy periods that began at age 89 and occurred almost constantly. She was on Depo and Mirena which did not help slow menstrual losses. Besides taking iron supplements while pregnant she has not taken PO iron replacement as she reports not tolerating it very well while pregnant.   She  denies concern for black stools or blood in the stools and has not had a colonoscopy or endoscopy before. The pt notes that she has not previously had a concern for stomach ulcers, but has acid reflux and takes Prevacid as needed for 7-8 years. She notes that she has not recently used Prevacid, for the last 3 months.  She is not taking vitamin replacements and denies any dietary restrictions.  The pt notes that she has been continuing to have lower right quadrant abdominal pain that radiates across her abdomen, presents for up to 45 minutes and occurs intermittently. She notes that her abdominal pain presented on 04/13/18.   The pt notes that was taking Ibuprofen 827m BID for 8 years to address her back pain related to her herniated disk. She stopped taking this 4 months ago.   Of note prior to the patient's visit today, pt has had CT A/P completed on 04/16/18 with results revealing No acute findings in the abdomen/pelvis. Least 4 low-density hepatic lesions, some of which may represent small cysts or hemangiomas, however there is a 19 mm lesion in the left lobe of the liver that is incompletely characterized. Recommend nonemergent hepatic protocol MRI for further evaluation..Marland KitchenPossible 11 mm pancreatic nodule rising from the tail, alternatively this may represent a splenule. This can also be assessed at time of hepatic MRI. Gallstone without gallbladder inflammation. Colonic diverticulosis without diverticulitis.   Most recent lab results (04/16/18) of CBC is as follows: all values are WNL except for HGB  at 9.5, HCT at 33.6, MCH at 23.8, MCHC at 28.3, RDW at 23.2, PLT at 435k. Ferritin 04/09/18 was low at 6 Vitamin B12 on 04/09/18 was at 196  On review of systems, pt reports light headedness, dizziness, lower back pain, intermittent abdominal pain, and denies black stools, blood in the stools, tingling or numbness in her legs or arms, vaginal bleeding, and any other symptoms.   INTERVAL HISTORY:   Margaret [name] returns today for management, evaluation of her Metastatic Adenocarcinoma of GI primary. She is here for C8D1 of FOLFIRI+ Avastrin. The patient's last visit with [place] was on 12/07/2019. The pt reports that she is doing well overall.  The pt reports that she did have some increase in energy directly after her last blood transfusion. Soon after she began having significantly more fatigue. All she does at home is lay down due to being tired constantly. Pt has also been having some SOB, lightheadedness, and headaches. Pt is experiencing seasonal allergies. She is currently eating less than half of her baseline but has gained weight. Nausea is not keeping her from eating at this time. Pt denies bloody/black stools and is having a bowel movement almost everyday. She is still having occasional right-sided abdominal pain, but overall her pain is currently well controlled.   Pt has also been dealing with a significant amount of stress because she is being overwhelmed by other's suggestions and a lack of true support from her close family. She has been feeling depressed as well.   Lab results today (12/21/19) of CBC w/diff and CMP is as follows: all values are WNL except for RBC at 3.21, Hgb at 8.4, HCT at 29.3, MCHC at 28.7, RDW at 18.1, Abs Immature Granulocytes at 0.11K, Chloride at 112, Glucose at 100, Creatinine at 1.37, Albumin at 2.5, AST at 14, GFR Est Af Am at 52.  On review of systems, pt reports abdominal pain, low appetite, fatigue, SOB, lightheadedness, headaches, stress, depressed mood and denies bloody/black stools, nausea, diarrhea, constipation, leg swelling and any other symptoms.    MEDICAL HISTORY:  Past Medical History:  Diagnosis Date  . Allergic rhinitis   . Anxiety diagnosed in 1990  . Arthritis   . Asthma   . colon ca with liver mets dx'd 08/2018  . Dyspnea    with low blood count hx of anemia  . Eczema   . History of blood transfusion   . Hypertension   . Lower back  pain   . Migraine   . Sleep apnea    uses CPAP    SURGICAL HISTORY: Past Surgical History:  Procedure Laterality Date  . ABDOMINAL HYSTERECTOMY    . cortisone injections     knees bilat and back   . IR IMAGING GUIDED PORT INSERTION  09/14/2018  . iron infusion    . LIVER BIOPSY      SOCIAL HISTORY: Social History   Socioeconomic History  . Marital status: Single    Spouse name: Not on file  . Number of children: Not on file  . Years of education: Not on file  . Highest education level: Not on file  Occupational History  . Not on file  Tobacco Use  . Smoking status: Former Smoker    Quit date: 07/30/1999    Years since quitting: 20.4  . Smokeless tobacco: Never Used  Substance and Sexual Activity  . Alcohol use: No  . Drug use: No  . Sexual activity: Not on file  Other  Topics Concern  . Not on file  Social History Narrative  . Not on file   Social Determinants of Health   Financial Resource Strain:   . Difficulty of Paying Living Expenses:   Food Insecurity:   . Worried About Charity fundraiser in the Last Year:   . Arboriculturist in the Last Year:   Transportation Needs:   . Film/video editor (Medical):   Marland Kitchen Lack of Transportation (Non-Medical):   Physical Activity:   . Days of Exercise per Week:   . Minutes of Exercise per Session:   Stress:   . Feeling of Stress :   Social Connections:   . Frequency of Communication with Friends and Family:   . Frequency of Social Gatherings with Friends and Family:   . Attends Religious Services:   . Active Member of Clubs or Organizations:   . Attends Archivist Meetings:   Marland Kitchen Marital Status:   Intimate Partner Violence:   . Fear of Current or Ex-Partner:   . Emotionally Abused:   Marland Kitchen Physically Abused:   . Sexually Abused:     FAMILY HISTORY: Family History  Problem Relation Age of Onset  . Diabetes Father     ALLERGIES:  is allergic to latex; tape; and tylenol with codeine #3  [acetaminophen-codeine].  MEDICATIONS:  Current Outpatient Medications  Medication Sig Dispense Refill  . albuterol (PROAIR HFA) 108 (90 Base) MCG/ACT inhaler INHALE TWO puffs BY MOUTH into the lungs EVERY 4 HOURS AS NEEDED FOR WHEEZING OR shortness of breath 18 g 1  . Boric Acid 4 % SOLN Place 1 capsule vaginally every Friday.     . budesonide-formoterol (SYMBICORT) 160-4.5 MCG/ACT inhaler Inhale 2 puffs into the lungs 2 (two) times a day. 10.2 g 5  . cetirizine (ZYRTEC) 10 MG tablet Take 1 tablet (10 mg total) by mouth daily. 30 tablet 5  . clobetasol ointment (TEMOVATE) 8.15 % Apply 1 application topically 2 (two) times daily. Use for Lichen Sclerosis    . Cyanocobalamin (B-12) 1000 MCG SUBL Place 1,000 mcg under the tongue daily. 30 each 3  . dexamethasone (DECADRON) 4 MG tablet TAKE 2 TABLETS (8 MG TOTAL) BY MOUTH DAILY  START THE DAY AFTER CHEMOTHERAPY FOR 2 DAYS. TAKE WITH FOOD. 30 tablet 1  . diazepam (VALIUM) 5 MG tablet Take 1 tablet (5 mg total) by mouth daily as needed for anxiety (panic attacks). 30 tablet 0  . diclofenac sodium (VOLTAREN) 1 % GEL Apply 2 g topically 4 (four) times daily as needed (for back/knee pain.).     Marland Kitchen dicyclomine (BENTYL) 20 MG tablet Take 1 tablet (20 mg total) by mouth 2 (two) times daily. (Patient taking differently: Take 20 mg by mouth 3 (three) times daily before meals. ) 20 tablet 0  . dronabinol (MARINOL) 5 MG capsule Take 1 capsule (5 mg total) by mouth 2 (two) times daily before a meal. 60 capsule 0  . ergocalciferol (VITAMIN D2) 50000 UNITS capsule Take 50,000 Units by mouth every Monday.     . lidocaine-prilocaine (EMLA) cream Apply to affected area once 30 g 3  . lisinopril-hydrochlorothiazide (PRINZIDE,ZESTORETIC) 20-12.5 MG tablet Take 1 tablet by mouth daily.    . methocarbamol (ROBAXIN) 750 MG tablet Take 750 mg by mouth every 8 (eight) hours as needed (back spasms.).     Marland Kitchen montelukast (SINGULAIR) 10 MG tablet Take 1 tablet (10 mg total) by  mouth at bedtime. 30 tablet 6  . nystatin-triamcinolone (  MYCOLOG II) cream Apply 1 application topically 2 (two) times daily as needed (irritation). Use in skin folds and under breast     . Olopatadine HCl (PAZEO) 0.7 % SOLN Apply 1 drop to eye daily. 2.5 mL 5  . ondansetron (ZOFRAN) 8 MG tablet Take 1 tablet (8 mg total) by mouth 2 (two) times daily as needed for refractory nausea / vomiting. Start on day 3 after chemotherapy. 30 tablet 0  . pantoprazole (PROTONIX) 40 MG tablet Take 1 tablet (40 mg total) by mouth daily before breakfast. 30 tablet 1  . pimecrolimus (ELIDEL) 1 % cream Apply 1 application topically 2 (two) times daily. 30 g 6  . prochlorperazine (COMPAZINE) 10 MG tablet Take 1 tablet (10 mg total) by mouth every 6 (six) hours as needed (Nausea or vomiting). 30 tablet 1  . saccharomyces boulardii (FLORASTOR) 250 MG capsule Take 1 capsule (250 mg total) by mouth 2 (two) times daily.    . traMADol (ULTRAM) 50 MG tablet Take 1-2 tablets (50-100 mg total) by mouth every 6 (six) hours as needed for moderate pain or severe pain. 90 tablet 0  . triamcinolone (NASACORT) 55 MCG/ACT AERO nasal inhaler Place 2 sprays into the nose daily as needed (for allergies). 1 Inhaler 5  . valACYclovir (VALTREX) 500 MG tablet Take 500 mg by mouth 2 (two) times daily as needed (for cold sores).      No current facility-administered medications for this visit.    Facility-Administered Medications Ordered in Other Visits  Medication Dose Route Frequency Provider Last Rate Last Dose  . fluorouracil (ADRUCIL) 6,950 mg in sodium chloride 0.9 % 111 mL chemo infusion  2,400 mg/m2 (Treatment Plan Recorded) Intravenous 1 day or 1 dose Brunetta Genera, MD      . leucovorin 1,156 mg in dextrose 5 % 250 mL infusion  400 mg/m2 (Treatment Plan Recorded) Intravenous Once Brunetta Genera, MD 77 mL/hr at 05/24/19 1242 1,156 mg at 05/24/19 1242  . oxaliplatin (ELOXATIN) 250 mg in dextrose 5 % 500 mL chemo infusion   250 mg Intravenous Once Brunetta Genera, MD 138 mL/hr at 05/24/19 1245 250 mg at 05/24/19 1245    REVIEW OF SYSTEMS:   A 10+ POINT REVIEW OF SYSTEMS WAS OBTAINED including neurology, dermatology, psychiatry, cardiac, respiratory, lymph, extremities, GI, GU, Musculoskeletal, constitutional, breasts, reproductive, HEENT.  All pertinent positives are noted in the HPI.  All others are negative.   PHYSICAL EXAMINATION:  ECOG FS:2 - Symptomatic, <50% confined to bed  Vitals:   12/21/19 1130  BP: (!) 139/92  Pulse: (!) 106  Resp: 20  Temp: 98.5 F (36.9 C)  SpO2: 100%   Wt Readings from Last 3 Encounters:  12/21/19 (!) 377 lb 3.2 oz (171.1 kg)  12/07/19 (!) 377 lb 8 oz (171.2 kg)  11/23/19 (!) 364 lb 4 oz (165.2 kg)   Body mass index is 57.35 kg/m.    GENERAL:alert, in no acute distress and comfortable SKIN: no acute rashes, no significant lesions EYES: conjunctiva are pink and non-injected, sclera anicteric OROPHARYNX: MMM, no exudates, no oropharyngeal erythema or ulceration NECK: supple, no JVD LYMPH:  no palpable lymphadenopathy in the cervical, axillary or inguinal regions LUNGS: clear to auscultation b/l with normal respiratory effort HEART: regular rate & rhythm ABDOMEN:  normoactive bowel sounds , non tender, not distended. No palpable hepatosplenomegaly.  Extremity: no pedal edema PSYCH: alert & oriented x 3 with fluent speech NEURO: no focal motor/sensory deficits   LABORATORY DATA:  I have reviewed the data as listed  . CBC Latest Ref Rng & Units 12/21/2019 12/07/2019 11/23/2019  WBC 4.0 - 10.5 K/uL 9.2 12.2(H) 8.3  Hemoglobin 12.0 - 15.0 g/dL 8.4(L) 9.3(L) 7.9(L)  Hematocrit 36.0 - 46.0 % 29.3(L) 31.7(L) 27.8(L)  Platelets 150 - 400 K/uL 390 355 307   ANC 900 . CMP Latest Ref Rng & Units 12/21/2019 12/07/2019 11/23/2019  Glucose 70 - 99 mg/dL 100(H) 96 93  BUN 6 - 20 mg/dL _0 Creatinine 0.44 - 1.00 mg/dL 1.37(H) 0.96 0.92  Sodium 135 - 145 mmol/L 145  143 144  Potassium 3.5 - 5.1 mmol/L 3.8 3.6 3.6  Chloride 98 - 111 mmol/L 112(H) 112(H) 111  CO2 22 - 32 mmol/L _1 Calcium 8.9 - 10.3 mg/dL 9.0 8.8(L) 8.7(L)  Total Protein 6.5 - 8.1 g/dL 7.4 7.1 7.0  Total Bilirubin 0.3 - 1.2 mg/dL 0.3 0.3 0.4  Alkaline Phos 38 - 126 U/L 118 129(H) 138(H)  AST 15 - 41 U/L 14(L) 21 27  ALT 0 - 44 U/L _2 . Lab Results  Component Value Date   IRON 40 (L) 12/07/2019   TIBC 199 (L) 12/07/2019   IRONPCTSAT 20 (L) 12/07/2019   (Iron and TIBC)  Lab Results  Component Value Date   FERRITIN 716 (H) 12/07/2019   B12  - 196--> 584  07/22/18 Liver Biopsy:   08/27/18 Repeat Liver Biopsy:   Molecular Pathology:     RADIOGRAPHIC STUDIES: I have personally reviewed the radiological images as listed and agreed with the findings in the report. No results found.  ASSESSMENT & PLAN:   49 y.o. female with  1. Iron Deficiency Anemia - ? Previous heavy periods vs GI losses  Recent heavy NSAID use ? Ulcer  2. B12 deficiency Labs upon initial presentation from 04/16/18, HGB at 9.5. The 04/09/18 Ferritin was low at 6 and Vitamin B12 was at 196 -No antiparietal antibodies, no intrinsic factor antibodies, normal TSH all from 04/28/18  PLAN: -Continue NSAID avoidance  -continue 1019mg Vitamin B12 sublingually daily - B12 improved from 196 to 584  3.MetastaticAdenocarcinoma of cecum with liver mets. KRAS mutated.  04/16/18 CT A/P with pt revealed No acute findings in the abdomen/pelvis. Least 4 low-density hepatic lesions, some of which may represent small cysts or hemangiomas, however there is a 19 mm lesion in the left lobe of the liver that is incompletely characterized. Recommend nonemergent hepatic protocol MRI for further evaluation..Marland KitchenPossible 11 mm pancreatic nodule rising from the tail, alternatively this may represent a splenule. This can also be assessed at time of hepatic MRI. Gallstone without gallbladder inflammation. Colonic  diverticulosis without diverticulitis.   07/09/18 Tumor marker work up: CEA normal at 1.32, AFP normal at 1.5, LDH normal at 138, CA 125 normal at 5.9, CA 19-9 normal at <2   07/22/18 Liver biopsy results revealed benign hepatic tissue, and was not diagnostic   07/16/18 PET/CT revealedThere is a solid hypermetabolic mass involving the cecum, worrisome for colonic primary neoplasm. Correlation withcolonoscopy Findings. 2. Multiple hypermetabolic liver lesions compatible with metastaticdisease. 3. Large low-density lesion in right lobe of thyroid gland. Advise further evaluation with thyroid sonography. 4. Aortic Atherosclerosis. 5. Small hiatal hernia.   08/27/18 Liver biopsy revealed Metastatic adenocarcinoma, consistent with gastrointestinal primary 08/27/18 Molecular pathology- KRAS mutation positive   09/03/18 ECHO revealed an LV EF of 677-82% mild diastolic dysfunction; mild RVE; dilated PA; mild TR; moderate to severe pulmonary  hypertension  09/17/18 CEA at 2.39, which was the last CEA pre-treatment value   12/13/18 CT A/P revealed Interval response to therapy. There is been decrease in size of multifocal liver metastasis. No new or progressive findings. 2. Hiatal hernia.   Did postpone C6 by one week due to concern for mild infection.   Did postpone C7 by two weeks for infection  06/15/2019 CT C/A/P ([id number]) ([id number]) revealed "1. Significant interval enlargement of ill-defined, hypodense masses of the liver, the largest index mass of the inferior right lobe, segment VI, measuring 4.1 x 3.7 cm, previously 1.0 cm when measured similarly (series 2, image 58). There are additional lesions in caudate (series 2, image 54), lateral right lobe (series 2, image 52) and liver dome (series 2, image 39), all substantially enlarged compared to prior examination. Findings are consistent with worsened metastatic disease. 2. Redemonstrated residual mass at the cecal base appears thickened in  comparison to prior examination, approximately 1.2 cm, previously 5 mm when measured similarly (series 4, image 61, series 2, image 110). 3. Stable 2 mm nodules of the left lung base (series 6, image 105, 116), likely benign and incidental. No definite evidence of metastatic disease in the chest. Attention on follow-up."1. Significant interval enlargement of ill-defined, hypodense masses of the liver, the largest index mass of the inferior right lobe, segment VI, measuring 4.1 x 3.7 cm, previously 1.0 cm when measured similarly (series 2, image 58). There are additional lesions in caudate (series 2, image 54), lateral right lobe (series 2, image 52) and liver dome (series 2, image 39), all substantially enlarged compared to prior examination. Findings are consistent with worsened metastatic disease. 2. Redemonstrated residual mass at the cecal base appears thickened in comparison to prior examination, approximately 1.2 cm, previously 5 mm when measured similarly (series 4, image 61, series 2, image 110). 3. Stable 2 mm nodules of the left lung base (series 6, image 105, 116), likely benign and incidental. No definite evidence of metastatic disease in the chest. Attention on follow-up."  10/27/2019 CT Abd/Pel ([id number]) revealed fairly significant progression in the cecum and liver, stable pulmonary nodules   PLAN: -Discussed pt labwork today, 12/21/19; anemia, other blood counts are okay, liver enzymes are steady, kidney numbers are slightly elevated - pt appears to be dehydrated -Advised pt that her fatigue is multifactorial including: chemotherapy, anemia, depression, malnutrition, being sedentary, and disease.  -Advised pt that she needs to eat as much as possible. Weight is not our top priority at this time.  -Discussed beginning pt on an antidepressants to improve mood - will begin pt on Cymbalta. Pt will also continue with chemo support groups at this time. -Discussed continuing blood transfusions  to help correct anemia and related fatigue  -Will hold C8D1 of FOLFIRI + Avastin today -Plan to discontinue 5FU and start Xeloda with rescheduled C8 -Will continue to watch CEA levels, last CEA down to 10.57 (12/07/2019) -Will watch BP with Avastin. Continue Lisinopril.  -Will give 1 unit of PRBC ASAP   FOLLOW UP: plz reschedule C8 2nd line rx with XELIRI + AVASTIN in 7-10 days   The total time spent in the appt was 30 minutes and more than 50% was on counseling and direct patient cares.  All of the patient's questions were answered with apparent satisfaction. The patient knows to call the clinic with any problems, questions or concerns.    Sullivan Lone MD [place] AAHIVMS [facility] Hematology/Oncology Physician [place]  (Office):  [phone] (Work cell):  [phone] (Fax):           [phone]  12/21/2019 5:04 PM   I, Yevette Edwards, am acting as a scribe for Dr. Sullivan Lone.   .I have reviewed the above documentation for accuracy and completeness, and I agree with the above. Brunetta Genera MD

## 2019-12-21 ENCOUNTER — Inpatient Hospital Stay (HOSPITAL_BASED_OUTPATIENT_CLINIC_OR_DEPARTMENT_OTHER): Payer: Medicaid Other | Admitting: Hematology

## 2019-12-21 ENCOUNTER — Other Ambulatory Visit: Payer: Self-pay

## 2019-12-21 ENCOUNTER — Other Ambulatory Visit: Payer: Self-pay | Admitting: *Deleted

## 2019-12-21 ENCOUNTER — Inpatient Hospital Stay: Payer: Medicaid Other

## 2019-12-21 ENCOUNTER — Inpatient Hospital Stay: Payer: Medicaid Other | Attending: Hematology

## 2019-12-21 ENCOUNTER — Encounter: Payer: Self-pay | Admitting: Hematology

## 2019-12-21 VITALS — BP 139/92 | HR 106 | Temp 98.5°F | Resp 20 | Ht 68.0 in | Wt 377.2 lb

## 2019-12-21 DIAGNOSIS — C189 Malignant neoplasm of colon, unspecified: Secondary | ICD-10-CM

## 2019-12-21 DIAGNOSIS — Z7189 Other specified counseling: Secondary | ICD-10-CM

## 2019-12-21 DIAGNOSIS — C787 Secondary malignant neoplasm of liver and intrahepatic bile duct: Secondary | ICD-10-CM | POA: Diagnosis not present

## 2019-12-21 DIAGNOSIS — D509 Iron deficiency anemia, unspecified: Secondary | ICD-10-CM | POA: Insufficient documentation

## 2019-12-21 DIAGNOSIS — E538 Deficiency of other specified B group vitamins: Secondary | ICD-10-CM | POA: Insufficient documentation

## 2019-12-21 DIAGNOSIS — Z87891 Personal history of nicotine dependence: Secondary | ICD-10-CM | POA: Insufficient documentation

## 2019-12-21 DIAGNOSIS — R530 Neoplastic (malignant) related fatigue: Secondary | ICD-10-CM | POA: Diagnosis not present

## 2019-12-21 DIAGNOSIS — Z5111 Encounter for antineoplastic chemotherapy: Secondary | ICD-10-CM | POA: Diagnosis not present

## 2019-12-21 DIAGNOSIS — C180 Malignant neoplasm of cecum: Secondary | ICD-10-CM | POA: Diagnosis not present

## 2019-12-21 DIAGNOSIS — M549 Dorsalgia, unspecified: Secondary | ICD-10-CM | POA: Insufficient documentation

## 2019-12-21 DIAGNOSIS — Z79899 Other long term (current) drug therapy: Secondary | ICD-10-CM | POA: Diagnosis not present

## 2019-12-21 DIAGNOSIS — I10 Essential (primary) hypertension: Secondary | ICD-10-CM | POA: Insufficient documentation

## 2019-12-21 DIAGNOSIS — R0602 Shortness of breath: Secondary | ICD-10-CM | POA: Insufficient documentation

## 2019-12-21 DIAGNOSIS — Z95828 Presence of other vascular implants and grafts: Secondary | ICD-10-CM

## 2019-12-21 DIAGNOSIS — F329 Major depressive disorder, single episode, unspecified: Secondary | ICD-10-CM | POA: Insufficient documentation

## 2019-12-21 DIAGNOSIS — E46 Unspecified protein-calorie malnutrition: Secondary | ICD-10-CM | POA: Diagnosis not present

## 2019-12-21 LAB — CMP (CANCER CENTER ONLY)
ALT: 9 U/L (ref 0–44)
AST: 14 U/L — ABNORMAL LOW (ref 15–41)
Albumin: 2.5 g/dL — ABNORMAL LOW (ref 3.5–5.0)
Alkaline Phosphatase: 118 U/L (ref 38–126)
Anion gap: 9 (ref 5–15)
BUN: 17 mg/dL (ref 6–20)
CO2: 24 mmol/L (ref 22–32)
Calcium: 9 mg/dL (ref 8.9–10.3)
Chloride: 112 mmol/L — ABNORMAL HIGH (ref 98–111)
Creatinine: 1.37 mg/dL — ABNORMAL HIGH (ref 0.44–1.00)
GFR, Est AFR Am: 52 mL/min — ABNORMAL LOW (ref >60)
GFR, Estimated: 45 mL/min — ABNORMAL LOW (ref >60)
Glucose, Bld: 100 mg/dL — ABNORMAL HIGH (ref 70–99)
Potassium: 3.8 mmol/L (ref 3.5–5.1)
Sodium: 145 mmol/L (ref 135–145)
Total Bilirubin: 0.3 mg/dL (ref 0.3–1.2)
Total Protein: 7.4 g/dL (ref 6.5–8.1)

## 2019-12-21 LAB — CBC WITH DIFFERENTIAL/PLATELET
Abs Immature Granulocytes: 0.11 10*3/uL — ABNORMAL HIGH (ref 0.00–0.07)
Basophils Absolute: 0.1 10*3/uL (ref 0.0–0.1)
Basophils Relative: 1 %
Eosinophils Absolute: 0.1 10*3/uL (ref 0.0–0.5)
Eosinophils Relative: 1 %
HCT: 29.3 % — ABNORMAL LOW (ref 36.0–46.0)
Hemoglobin: 8.4 g/dL — ABNORMAL LOW (ref 12.0–15.0)
Immature Granulocytes: 1 %
Lymphocytes Relative: 34 %
Lymphs Abs: 3.2 10*3/uL (ref 0.7–4.0)
MCH: 26.2 pg (ref 26.0–34.0)
MCHC: 28.7 g/dL — ABNORMAL LOW (ref 30.0–36.0)
MCV: 91.3 fL (ref 80.0–100.0)
Monocytes Absolute: 0.8 10*3/uL (ref 0.1–1.0)
Monocytes Relative: 9 %
Neutro Abs: 5 10*3/uL (ref 1.7–7.7)
Neutrophils Relative %: 54 %
Platelets: 390 10*3/uL (ref 150–400)
RBC: 3.21 MIL/uL — ABNORMAL LOW (ref 3.87–5.11)
RDW: 18.1 % — ABNORMAL HIGH (ref 11.5–15.5)
WBC: 9.2 10*3/uL (ref 4.0–10.5)
nRBC: 0 % (ref 0.0–0.2)

## 2019-12-21 LAB — PREPARE RBC (CROSSMATCH)

## 2019-12-21 MED ORDER — SODIUM CHLORIDE 0.9% IV SOLUTION
250.0000 mL | Freq: Once | INTRAVENOUS | Status: DC
  Filled 2019-12-21: qty 250

## 2019-12-21 MED ORDER — SODIUM CHLORIDE 0.9% FLUSH
10.0000 mL | INTRAVENOUS | Status: AC | PRN
  Administered 2019-12-21: 14:00:00 10 mL
  Filled 2019-12-21: qty 10

## 2019-12-21 MED ORDER — DIPHENHYDRAMINE HCL 25 MG PO CAPS: 25.0000 mg | ORAL_CAPSULE | Freq: Once | ORAL | Status: DC

## 2019-12-21 MED ORDER — SODIUM CHLORIDE 0.9% FLUSH
10.0000 mL | Freq: Once | INTRAVENOUS | Status: AC
  Administered 2019-12-21: 11:00:00 10 mL
  Filled 2019-12-21: qty 10

## 2019-12-21 MED ORDER — HEPARIN SOD (PORK) LOCK FLUSH 100 UNIT/ML IV SOLN
500.0000 [IU] | Freq: Every day | INTRAVENOUS | Status: AC | PRN
  Administered 2019-12-21: 500 [IU]
  Filled 2019-12-21: qty 5

## 2019-12-21 MED ORDER — METHYLPREDNISOLONE SODIUM SUCC 125 MG IJ SOLR
INTRAMUSCULAR | Status: AC
  Filled 2019-12-21: qty 2

## 2019-12-21 MED ORDER — ACETAMINOPHEN 325 MG PO TABS
ORAL_TABLET | ORAL | Status: AC
  Filled 2019-12-21: qty 2

## 2019-12-21 MED ORDER — METHYLPREDNISOLONE SODIUM SUCC 125 MG IJ SOLR: 60.0000 mg | Freq: Once | INTRAMUSCULAR | Status: DC

## 2019-12-21 MED ORDER — ACETAMINOPHEN 325 MG PO TABS: 650.0000 mg | ORAL_TABLET | Freq: Once | ORAL | Status: DC

## 2019-12-21 MED ORDER — DIPHENHYDRAMINE HCL 25 MG PO CAPS
ORAL_CAPSULE | ORAL | Status: AC
  Filled 2019-12-21: qty 1

## 2019-12-21 NOTE — Patient Instructions (Addendum)

## 2019-12-21 NOTE — Telephone Encounter (Signed)
Contacted patient at Dr. Grier Mitts request to ask if she would keep appointments today for lab and to see him. He would like to discuss options for treatment with her. Patient stated she had arrived at [facility] and was checking in. She will see Dr. Irene Limbo and make decision about today's treatment at that time.

## 2019-12-22 ENCOUNTER — Encounter: Payer: Self-pay | Admitting: Hematology

## 2019-12-22 ENCOUNTER — Telehealth: Payer: Self-pay | Admitting: *Deleted

## 2019-12-22 ENCOUNTER — Other Ambulatory Visit: Payer: Self-pay | Admitting: *Deleted

## 2019-12-22 DIAGNOSIS — C189 Malignant neoplasm of colon, unspecified: Secondary | ICD-10-CM

## 2019-12-22 DIAGNOSIS — D509 Iron deficiency anemia, unspecified: Secondary | ICD-10-CM

## 2019-12-22 NOTE — Telephone Encounter (Signed)
Verbal order from Dr. Irene Limbo: Refer to [place]. Contacted at [phone], gave patient information to [place].

## 2019-12-23 ENCOUNTER — Inpatient Hospital Stay: Payer: Medicaid Other

## 2019-12-23 ENCOUNTER — Other Ambulatory Visit: Payer: Self-pay

## 2019-12-23 VITALS — BP 141/83 | HR 78 | Temp 98.1°F | Resp 16

## 2019-12-23 DIAGNOSIS — Z95828 Presence of other vascular implants and grafts: Secondary | ICD-10-CM

## 2019-12-23 DIAGNOSIS — Z7189 Other specified counseling: Secondary | ICD-10-CM

## 2019-12-23 DIAGNOSIS — D509 Iron deficiency anemia, unspecified: Secondary | ICD-10-CM | POA: Diagnosis not present

## 2019-12-23 DIAGNOSIS — C189 Malignant neoplasm of colon, unspecified: Secondary | ICD-10-CM

## 2019-12-23 DIAGNOSIS — C787 Secondary malignant neoplasm of liver and intrahepatic bile duct: Secondary | ICD-10-CM

## 2019-12-23 MED ORDER — METHYLPREDNISOLONE SODIUM SUCC 125 MG IJ SOLR
60.0000 mg | Freq: Once | INTRAMUSCULAR | Status: AC
  Administered 2019-12-23: 60 mg via INTRAVENOUS

## 2019-12-23 MED ORDER — METHYLPREDNISOLONE SODIUM SUCC 125 MG IJ SOLR
INTRAMUSCULAR | Status: AC
  Filled 2019-12-23: qty 2

## 2019-12-23 MED ORDER — DIPHENHYDRAMINE HCL 25 MG PO CAPS
ORAL_CAPSULE | ORAL | Status: AC
  Filled 2019-12-23: qty 1

## 2019-12-23 MED ORDER — ACETAMINOPHEN 325 MG PO TABS
650.0000 mg | ORAL_TABLET | Freq: Once | ORAL | Status: AC
  Administered 2019-12-23: 12:00:00 650 mg via ORAL

## 2019-12-23 MED ORDER — HEPARIN SOD (PORK) LOCK FLUSH 100 UNIT/ML IV SOLN
500.0000 [IU] | Freq: Once | INTRAVENOUS | Status: AC
  Administered 2019-12-23: 15:00:00 500 [IU]
  Filled 2019-12-23: qty 5

## 2019-12-23 MED ORDER — SODIUM CHLORIDE 0.9% FLUSH
10.0000 mL | Freq: Once | INTRAVENOUS | Status: AC
  Administered 2019-12-23: 10 mL
  Filled 2019-12-23: qty 10

## 2019-12-23 MED ORDER — ACETAMINOPHEN 325 MG PO TABS
ORAL_TABLET | ORAL | Status: AC
  Filled 2019-12-23: qty 2

## 2019-12-23 MED ORDER — SODIUM CHLORIDE 0.9% IV SOLUTION
250.0000 mL | Freq: Once | INTRAVENOUS | Status: AC
  Administered 2019-12-23: 12:00:00 250 mL via INTRAVENOUS
  Filled 2019-12-23: qty 250

## 2019-12-23 MED ORDER — DIPHENHYDRAMINE HCL 25 MG PO CAPS
25.0000 mg | ORAL_CAPSULE | Freq: Once | ORAL | Status: AC
  Administered 2019-12-23: 25 mg via ORAL

## 2019-12-23 NOTE — Patient Instructions (Signed)
Blood Transfusion, Adult, Care After This sheet gives you information about how to care for yourself after your procedure. Your doctor may also give you more specific instructions. If you have problems or questions, contact your doctor. What can I expect after the procedure? After the procedure, it is common to have:  Bruising and soreness at the IV site.  A fever or chills on the day of the procedure. This may be your body's response to the new blood cells received.  A headache. Follow these instructions at home: Insertion site care      Follow instructions from your doctor about how to take care of your insertion site. This is where an IV tube was put into your vein. Make sure you: ? Wash your hands with soap and water before and after you change your bandage (dressing). If you cannot use soap and water, use hand sanitizer. ? Change your bandage as told by your doctor.  Check your insertion site every day for signs of infection. Check for: ? Redness, swelling, or pain. ? Bleeding from the site. ? Warmth. ? Pus or a bad smell. General instructions  Take over-the-counter and prescription medicines only as told by your doctor.  Rest as told by your doctor.  Go back to your normal activities as told by your doctor.  Keep all follow-up visits as told by your doctor. This is important. Contact a doctor if:  You have itching or red, swollen areas of skin (hives).  You feel worried or nervous (anxious).  You feel weak after doing your normal activities.  You have redness, swelling, warmth, or pain around the insertion site.  You have blood coming from the insertion site, and the blood does not stop with pressure.  You have pus or a bad smell coming from the insertion site. Get help right away if:  You have signs of a serious reaction. This may be coming from an allergy or the body's defense system (immune system). Signs include: ? Trouble breathing or shortness of  breath. ? Swelling of the face or feeling warm (flushed). ? Fever or chills. ? Head, chest, or back pain. ? Dark pee (urine) or blood in the pee. ? Widespread rash. ? Fast heartbeat. ? Feeling dizzy or light-headed. You may receive your blood transfusion in an outpatient setting. If so, you will be told whom to contact to report any reactions. These symptoms may be an emergency. Do not wait to see if the symptoms will go away. Get medical help right away. Call your local emergency services (911 in the [place] Do not drive yourself to the hospital. Summary  Bruising and soreness at the IV site are common.  Check your insertion site every day for signs of infection.  Rest as told by your doctor. Go back to your normal activities as told by your doctor.  Get help right away if you have signs of a serious reaction. This information is not intended to replace advice given to you by your health care provider. Make sure you discuss any questions you have with your health care provider. Document Revised: 02/24/2019 Document Reviewed: 02/24/2019 Elsevier Patient Education  2020 [employer]   Pegfilgrastim injection What is this medicine? PEGFILGRASTIM (PEG fil gra stim) is a long-acting granulocyte colony-stimulating factor that stimulates the growth of neutrophils, a type of white blood cell important in the body's fight against infection. It is used to reduce the incidence of fever and infection in patients with certain types of cancer   who are receiving chemotherapy that affects the bone marrow, and to increase survival after being exposed to high doses of radiation. This medicine may be used for other purposes; ask your health care provider or pharmacist if you have questions. COMMON BRAND NAME(S): Fulphila, Neulasta, UDENYCA, Ziextenzo What should I tell my health care provider before I take this medicine? They need to know if you have any of these conditions:  kidney disease  latex  allergy  ongoing radiation therapy  sickle cell disease  skin reactions to acrylic adhesives (On-Body Injector only)  an unusual or allergic reaction to pegfilgrastim, filgrastim, other medicines, foods, dyes, or preservatives  pregnant or trying to get pregnant  breast-feeding How should I use this medicine? This medicine is for injection under the skin. If you get this medicine at home, you will be taught how to prepare and give the pre-filled syringe or how to use the On-body Injector. Refer to the patient Instructions for Use for detailed instructions. Use exactly as directed. Tell your healthcare provider immediately if you suspect that the On-body Injector may not have performed as intended or if you suspect the use of the On-body Injector resulted in a missed or partial dose. It is important that you put your used needles and syringes in a special sharps container. Do not put them in a trash can. If you do not have a sharps container, call your pharmacist or healthcare provider to get one. Talk to your pediatrician regarding the use of this medicine in children. While this drug may be prescribed for selected conditions, precautions do apply. Overdosage: If you think you have taken too much of this medicine contact a poison control center or emergency room at once. NOTE: This medicine is only for you. Do not share this medicine with others. What if I miss a dose? It is important not to miss your dose. Call your doctor or health care professional if you miss your dose. If you miss a dose due to an On-body Injector failure or leakage, a new dose should be administered as soon as possible using a single prefilled syringe for manual use. What may interact with this medicine? Interactions have not been studied. Give your health care provider a list of all the medicines, herbs, non-prescription drugs, or dietary supplements you use. Also tell them if you smoke, drink alcohol, or use illegal  drugs. Some items may interact with your medicine. This list may not describe all possible interactions. Give your health care provider a list of all the medicines, herbs, non-prescription drugs, or dietary supplements you use. Also tell them if you smoke, drink alcohol, or use illegal drugs. Some items may interact with your medicine. What should I watch for while using this medicine? You may need blood work done while you are taking this medicine. If you are going to need a MRI, CT scan, or other procedure, tell your doctor that you are using this medicine (On-Body Injector only). What side effects may I notice from receiving this medicine? Side effects that you should report to your doctor or health care professional as soon as possible:  allergic reactions like skin rash, itching or hives, swelling of the face, lips, or tongue  back pain  dizziness  fever  pain, redness, or irritation at site where injected  pinpoint red spots on the skin  red or dark-brown urine  shortness of breath or breathing problems  stomach or side pain, or pain at the shoulder  swelling  tiredness    trouble passing urine or change in the amount of urine Side effects that usually do not require medical attention (report to your doctor or health care professional if they continue or are bothersome):  bone pain  muscle pain This list may not describe all possible side effects. Call your doctor for medical advice about side effects. You may report side effects to FDA at 1-800-FDA-1088. Where should I keep my medicine? Keep out of the reach of children. If you are using this medicine at home, you will be instructed on how to store it. Throw away any unused medicine after the expiration date on the label. NOTE: This sheet is a summary. It may not cover all possible information. If you have questions about this medicine, talk to your doctor, pharmacist, or health care provider.  2020 Elsevier/Gold  Standard (2017-12-07 16:57:08)  

## 2019-12-24 LAB — TYPE AND SCREEN
ABO/RH(D): A POS
Antibody Screen: NEGATIVE
Unit division: 0

## 2019-12-24 LAB — BPAM RBC
Blood Product Expiration Date: 202104172359
ISSUE DATE / TIME: 202104091318
Unit Type and Rh: 6200

## 2019-12-27 NOTE — Progress Notes (Signed)
DISCONTINUE ON PATHWAY REGIMEN - Colorectal     A cycle is every 14 days:     Bevacizumab-xxxx      Irinotecan      Leucovorin      Fluorouracil      Fluorouracil   **Always confirm dose/schedule in your pharmacy ordering system**  REASON: Toxicities / Adverse Event PRIOR TREATMENT: [id number]: FOLFIRI + Bevacizumab q14 Days TREATMENT RESPONSE: Partial Response (PR)  START ON PATHWAY REGIMEN - Colorectal     A cycle is every 21 days:     Capecitabine      Bevacizumab-xxxx      Irinotecan   **Always confirm dose/schedule in your pharmacy ordering system**  Patient Characteristics: Distant Metastases, Nonsurgical Candidate, KRAS/NRAS Mutation Positive/Unknown (BRAF V600 Wild-Type/Unknown), Standard Cytotoxic Therapy, Second Line Standard Cytotoxic Therapy, Bevacizumab Eligible Tumor Location: Colon Therapeutic Status: Distant Metastases Microsatellite/Mismatch Repair Status: MSS/pMMR BRAF Mutation Status: Wild-Type (no mutation) KRAS/NRAS Mutation Status: Mutation Positive Standard Cytotoxic Line of Therapy: Second Line Standard Cytotoxic Therapy Bevacizumab Eligibility: Eligible Intent of Therapy: Non-Curative / Palliative Intent, Discussed with Patient

## 2019-12-28 ENCOUNTER — Telehealth: Payer: Self-pay

## 2019-12-28 ENCOUNTER — Telehealth: Payer: Self-pay | Admitting: Pharmacist

## 2019-12-28 ENCOUNTER — Other Ambulatory Visit: Payer: Self-pay | Admitting: Hematology

## 2019-12-28 DIAGNOSIS — Z7189 Other specified counseling: Secondary | ICD-10-CM

## 2019-12-28 DIAGNOSIS — C787 Secondary malignant neoplasm of liver and intrahepatic bile duct: Secondary | ICD-10-CM

## 2019-12-28 DIAGNOSIS — C189 Malignant neoplasm of colon, unspecified: Secondary | ICD-10-CM

## 2019-12-28 MED ORDER — LOPERAMIDE HCL 2 MG PO TABS: 2.0000 mg | ORAL_TABLET | Freq: Four times a day (QID) | ORAL | 1 refills | Status: AC | PRN

## 2019-12-28 MED ORDER — CAPECITABINE 500 MG PO TABS: 700.0000 mg/m2 | ORAL_TABLET | Freq: Two times a day (BID) | ORAL | 0 refills | Status: AC

## 2019-12-28 MED ORDER — PROCHLORPERAZINE MALEATE 10 MG PO TABS: 10.0000 mg | ORAL_TABLET | Freq: Four times a day (QID) | ORAL | 1 refills | Status: AC | PRN

## 2019-12-28 NOTE — Telephone Encounter (Signed)
Phone call placed to patient's home number to schedule visit with Palliative Care. No message left.

## 2019-12-28 NOTE — Telephone Encounter (Signed)
Oral Oncology Pharmacist Encounter  Received new prescription for Xeloda (capecitabine) for the treatment of metastatic colon cancer in conjunction with bevacizumab and irinotecan, planned duration until disease progression or unacceptable drug toxicity.  CMP from 12/21/19 assessed, no relevant lab abnormalities. Prescription dose and frequency assessed.   Prescription has been e-scribed to the [facility] for benefits analysis and approval.  Oral Oncology Clinic will continue to follow for insurance authorization, copayment issues, initial counseling and start date.  Darl Pikes, PharmD, BCPS, BCOP, CPP Hematology/Oncology Clinical Pharmacist [facility] [phone]  12/28/2019 12:23 PM

## 2019-12-28 NOTE — Progress Notes (Signed)
ON PATHWAY REGIMEN - Colorectal  No Change  Continue With Treatment as Ordered.     A cycle is every 21 days:     Capecitabine      Bevacizumab-xxxx      Irinotecan   **Always confirm dose/schedule in your pharmacy ordering system**  Patient Characteristics: Distant Metastases, Nonsurgical Candidate, KRAS/NRAS Mutation Positive/Unknown (BRAF V600 Wild-Type/Unknown), Standard Cytotoxic Therapy, Second Line Standard Cytotoxic Therapy, Bevacizumab Eligible Tumor Location: Colon Therapeutic Status: Distant Metastases Microsatellite/Mismatch Repair Status: MSS/pMMR BRAF Mutation Status: Wild-Type (no mutation) KRAS/NRAS Mutation Status: Mutation Positive Standard Cytotoxic Line of Therapy: Second Line Standard Cytotoxic Therapy Bevacizumab Eligibility: Eligible Intent of Therapy: Non-Curative / Palliative Intent, Discussed with Patient

## 2019-12-28 NOTE — Telephone Encounter (Signed)
Oral Oncology Patient Advocate Encounter  After completing a benefits investigation, prior authorization for Xeloda is not required at this time through [facility].  Patient's copay is $3.  [place] [facility] [place] Phone [phone] Fax [phone] 12/28/2019 2:31 PM

## 2019-12-28 NOTE — Telephone Encounter (Signed)
Phone call placed to patient to introduce Palliative care and offer to schedule visit with NP. Patient receptive to this. Scheduled visit for Friday 12/30/2019 @ 2pm. Verbal consent obtained.

## 2019-12-29 ENCOUNTER — Telehealth: Payer: Self-pay | Admitting: Hematology

## 2019-12-29 NOTE — Telephone Encounter (Signed)
Scheduled appt per 4/14 sch message - unable to reach pt . And unable to leave message

## 2019-12-30 ENCOUNTER — Other Ambulatory Visit: Payer: Medicaid Other | Admitting: Internal Medicine

## 2019-12-30 ENCOUNTER — Other Ambulatory Visit: Payer: Self-pay

## 2019-12-30 DIAGNOSIS — Z515 Encounter for palliative care: Secondary | ICD-10-CM

## 2019-12-30 DIAGNOSIS — Z7189 Other specified counseling: Secondary | ICD-10-CM

## 2019-12-30 NOTE — Progress Notes (Signed)
April 16th, 2021  [facility] Palliative Care Consult Note  Telephone: [phone]  Fax: [phone]   PATIENT NAME: Margaret [name]  DOB: 23-Feb-1971  MRN: [id number]  [address] [place], [place] [address]  Pharmacy:  [place] on [address].  [phone] [facility])  [phone] (Home Phone)   PRIMARY CARE PROVIDER:  Nolene Ebbs, MD  [place], [place] [address]   REFERRING PROVIDER: Dr. Sullivan Lone ([place])   RESPONSIBLE PARTY: Leean, Vaquez (Daughter)  [phone] (Mobile)   ASSESSMENT:  1. Advance Care Planning:  A. Directives: We didn't address this visit B. Goals of Care:  -To improve strength, mobility, stamina so that can cook meals in the kitchen, walk without dyspnea as far as the picnic tables out in the apt. common area, go to the grocery store, and be more available to her children and grandchildren.  2. Symptom Management / Cognitive / Functional status:   Patient is A & O; easily conversant. She admits to depression. A counselor officiated a family meeting recently, and those present had verbally committed to chip in to help patient in various ways. But ultimately, they did not come through. This was especially hurtful to patient. Patient is a single mom. She was the rock of her family but currently needs to be dependent on them. Her 24yo son is floundering d/t patient's illness. Patient has tapped him into online therapy (including [facility]). Patient was participating in an online cancer support group, but she's been too fatigued lately to do so. She plans to pick up on this. Dr. Irene Limbo prescribed Cymbalta (which can help with both pain and depression) but she's not sure if it's been filled at the pharmacy yet. Patient does have a good social and church support network, who phone and check in on her regularly. She's been encouraged that her CEA had decreased on last recent check. She  has a restaging scan coming up soon. She has valium 5mg  available; she takes this on average twice a week for severe dyspnea.   She has been increasingly fatigued and somnolent; is awake only about 6 hours/day.   Needs some assist with dressing. Can toilet independently to bedside commode. She really has been unable to adequately shower. Can get lightheaded and dizzy after standing just a few min. Because of this she needs a family member to stand by when she ambulates to the bathroom or kitchen. She's deconditioned, fatigued, and marked dyspnea with ambulating short distances; multifactorial d/t her disease process, anemia, and sedentary lifestyle.   Dyspnea with minimal exertion, like supine to sitting. If she ambulates to BR (19ft), it takes 50min for her to recover to baseline. Noted improved after last blood transfusion.  She really would like a rolling walker with a seat, so that she could sit when she needs, and would be able to go to the kitchen and other rooms and expand her range of territory. Her symptoms improved a little after transfusion PRBCs 3 weeks earlier (Hgb 8.4 on 12/21/19). She feels like her asthma has been under control. Her RA sats for me were 99% at rest.  She feels almost constantly cold, thought r/t her anemia as this improves after tx PRBCs.   Since initiation of chemo, patient has had symptoms of LE numbness/burning/pins & needles/throbbing of legs and hands. She had been on Neurontin 4 years ago for arthritic complaints and found it unhelpful but might benefit from trialing this again  for new chemo related neuropathy.   She has chronic arthritic pain lower back and knees. She fell 2.5 weeks ago; was standing and "my legs just went out on me". Felt reinjured her R knee, initially injured from a prior fall a year earlier. She has chronic, currently mild,  R upper quadrant pain thought r/t her cancer. She has taken oxycodone for this in past but it "makes me feel high" so has  avoided. She sometimes takes a Bentyl to help with abdominal cramps that sometimes occur when moving her bowels.  She is occasionally incontinent of urine. Wears adult diapers (provided by [facility]) but her script has expired. Her PCP office won't re-prescribe until she has a f/u office visit, which she is having trouble arranging. She had a shower chair delivered to her from [place], but it came in pieces and she's not sure how to put together. [employer] says she would have to deliver the chair back to them (in [place] or [place]), for them to put together.  H/O migraines since age 38, but since starting chemo frequency her headaches have increased to 2-3 x's / week. Episodes last 60min or so; no particular triggers notes. Very severe pounding back of her head ("like blood pulsing").  May also have behind the eye pain. If she gets up and about shortly after an episode, will trigger a recurrence. She used to manage her head aches with ibuprofen but discontinued d/t anemia/risk of GIB.   Weight loss with poor appetite/early satiety. Her sense of taste hasn't fully come back since a bout of COVID 6 months ago. She has been eating the equivalent of one meal a day, but in 6 divided portions. After taking a few bites, the food just seems to sit on her stomach as a weight. Her current weight 377lbs, which is an unintentional loss of 92 lbs over the last 28 months. At a height of 5'8" her BMI is 58.53kg/m2. She hasn't been able to tolerate liquid nutritional supplements d/t lactulose intolerance/food allergies (including nuts). Able to drink adequate fluids. She is prescribed Marinol but no longer takes b/c wasn't working for her. She is on Decadron 2mg  bid.   She moves her bowels regularly qd. The chemo tends to cause watery stool.  She hasn't had any recent nausea, last episodes last week. Unrelieved with rotating her antiemetics (Phenergan, Zofran, Compazine).  RECOMMENDATIONS/PLAN: -check with [employer] on [address] if they may have a large seat rolling walker available for donation. -Check with Dr. Grier Mitts office if they would  *write the script for the adult diapers (paid through her Medicaid) (Through [facility]. Fax #  ), and for  *home PT/OT. If that is approved, may be able to provide a home health aide to assist with showers. *be okay with initiating Neurontin for LE neuropathy *Check of okay to start Tylenol (consideration of liver mets) for treatment of arthritic pain *Can she combine her antiemetics (take one, then the other within an hour or so if first ineffective)? *start Remeron 15mg  qhs (appetite/depression)  -I'll try to put her shower chair together at my next visit.   -Check at the pharmacy if Cymbalta was prescribed  -patient plans to check with apartment management team if they would install handicap grab bars in one of the bathrooms.  -Suggested trialing [employer] Instant Breakfast in oat milk as a nutritional supplement.  -will start discussions with patient regarding setting some short / long term goals, with plans to achieve  them  4. Family Supports: single mom. Son Erasmo Downer 27), son Quillian Quince (28), daughter Loma Sousa (43), [employer]. 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 Cancer center provides lift, and Medicaid will transport (5 days in advance).   5. Follow up Palliative Care Visit: Fri 02/17/20 @ noon. Will address goals of care, try to put shower chair together I spent 60 minutes providing this consultation from 2pm to 3pm. More than 50% of the time in this consultation was spent coordinating communication.   HISTORY OF PRESENT ILLNESS: Margaret [name] is a 49 y.o. female with metastatic (liver) colon (cecum) cancer (dx 08/2018. Last CT: sig progression cecum and liver), asthma, arthritis, anxiety, HTN, sleep apnea (CPAP). Palliative Care was asked to help address goals of care.   CODE STATUS: Full  PPS: 40%    HOSPICE ELIGIBILITY/DIAGNOSIS: TBD    PAST MEDICAL HISTORY:  Past Medical History:  Diagnosis Date  . Allergic rhinitis   . Anxiety diagnosed in 1990  . Arthritis   . Asthma   . colon ca with liver mets dx'd 08/2018  . Dyspnea    with low blood count hx of anemia  . Eczema   . History of blood transfusion   . Hypertension   . Lower back pain   . Migraine   . Sleep apnea    uses CPAP    SOCIAL HX:  Social History   Tobacco Use  . Smoking status: Former Smoker    Quit date: 07/30/1999    Years since quitting: 20.4  . Smokeless tobacco: Never Used  Substance Use Topics  . Alcohol use: No    ALLERGIES:  Allergies  Allergen Reactions  . Latex Hives and Shortness Of Breath  . Tape Rash  . Tylenol With Codeine #3 [Acetaminophen-Codeine] Itching and Rash     PERTINENT MEDICATIONS:  Outpatient Encounter Medications as of 12/30/2019  Medication Sig  . albuterol (PROAIR HFA) 108 (90 Base) MCG/ACT inhaler Inhale 2 puffs into the lungs every 4 (four) hours as needed for wheezing or shortness of breath.  . benzonatate (TESSALON) 200 MG capsule Take 1 capsule (200 mg total) by mouth 3 (three) times daily as needed for cough.  . Boric Acid 4 % SOLN Place 1 capsule vaginally every Friday.   . budesonide-formoterol (SYMBICORT) 160-4.5 MCG/ACT inhaler Inhale 2 puffs into the lungs 2 (two) times daily.  . capecitabine (XELODA) 500 MG tablet Take 4 tablets (2,000 mg total) by mouth 2 (two) times daily after a meal for 14 days. Take 14 days on, 7 days off, repeat every 21 days.  . cetirizine (ZYRTEC) 10 MG tablet Take 1 tablet (10 mg total) by mouth daily. (Patient taking differently: Take 10 mg by mouth daily as needed for allergies. )  . clobetasol ointment (TEMOVATE) AB-123456789 % Apply 1 application topically 2 (two) times daily. Use for Lichen Sclerosis  . Cyanocobalamin (B-12) 1000 MCG SUBL Place 1,000 mcg under the tongue daily.  Marland Kitchen dexamethasone (DECADRON) 2 MG tablet Take 1 tablet (2  mg total) by mouth 2 (two) times daily with breakfast and lunch.  . diazepam (VALIUM) 5 MG tablet Take 1 tablet (5 mg total) by mouth daily as needed for anxiety (panic attacks).  . diclofenac sodium (VOLTAREN) 1 % GEL Apply 2 g topically 4 (four) times daily as needed (for back/knee pain.).   Marland Kitchen dicyclomine (BENTYL) 20 MG tablet Take 1 tablet (20 mg total) by mouth 2 (two) times daily. (Patient taking differently:  Take 20 mg by mouth 3 (three) times daily before meals. )  . dronabinol (MARINOL) 5 MG capsule Take 1 capsule (5 mg total) by mouth 2 (two) times daily before a meal. (Patient taking differently: Take 5 mg by mouth 3 (three) times daily before meals. )  . ergocalciferol (VITAMIN D2) 50000 UNITS capsule Take 50,000 Units by mouth every Monday.   Marland Kitchen lisinopril-hydrochlorothiazide (PRINZIDE,ZESTORETIC) 20-12.5 MG tablet Take 1 tablet by mouth daily.  Marland Kitchen loperamide (IMODIUM A-D) 2 MG tablet Take 1 tablet (2 mg total) by mouth 4 (four) times daily as needed. Take 2 at diarrhea onset , then 1 every 2hr until 12hrs with no BM. May take 2 every 4hrs at night. If diarrhea recurs repeat.  . magic mouthwash SOLN Take 5 mLs by mouth 4 (four) times daily as needed.  . montelukast (SINGULAIR) 10 MG tablet Take 1 tablet (10 mg total) by mouth at bedtime.  Marland Kitchen nystatin-triamcinolone (MYCOLOG II) cream Apply 1 application topically 2 (two) times daily as needed (irritation). Use in skin folds and under breast   . Olopatadine HCl (PAZEO) 0.7 % SOLN Apply 1 drop to eye daily. (Patient taking differently: Apply 1 drop to eye daily as needed (dry eyes). )  . ondansetron (ZOFRAN) 8 MG tablet Take 1 tablet (8 mg total) by mouth every 8 (eight) hours as needed for nausea or vomiting. (Patient not taking: Reported on 12/21/2019)  . oxyCODONE (OXY IR/ROXICODONE) 5 MG immediate release tablet Take 1-2 tablets (5-10 mg total) by mouth every 4 (four) hours as needed for moderate pain or severe pain (cancer related pain).  Marland Kitchen  oxyCODONE-acetaminophen (PERCOCET/ROXICET) 5-325 MG tablet Take 1-2 tablets by mouth every 4 (four) hours as needed for moderate pain or severe pain. (Patient not taking: Reported on 12/21/2019)  . pantoprazole (PROTONIX) 40 MG tablet Take 1 tablet (40 mg total) by mouth daily before breakfast.  . pimecrolimus (ELIDEL) 1 % cream Apply 1 application topically 2 (two) times daily. (Patient taking differently: Apply 1 application topically 2 (two) times daily as needed (eczema). )  . prochlorperazine (COMPAZINE) 10 MG tablet Take 1 tablet (10 mg total) by mouth every 6 (six) hours as needed (NAUSEA).  . promethazine (PHENERGAN) 25 MG tablet Take 1 tablet (25 mg total) by mouth every 6 (six) hours as needed for nausea. (Patient not taking: Reported on 12/21/2019)  . saccharomyces boulardii (FLORASTOR) 250 MG capsule Take 1 capsule (250 mg total) by mouth 2 (two) times daily.  Marland Kitchen senna-docusate (SENNA S) 8.6-50 MG tablet Take 2 tablets by mouth at bedtime.  . triamcinolone (NASACORT) 55 MCG/ACT AERO nasal inhaler Place 2 sprays into the nose daily as needed (for allergies).  . valACYclovir (VALTREX) 500 MG tablet Take 500 mg by mouth 2 (two) times daily as needed (for cold sores).    No facility-administered encounter medications on file as of 12/30/2019.    PHYSICAL EXAM:  Sat RA 99% HR 76, RR 20  No dyspnea with conversation. NAD. Obese middle aged female very pleasantly conversant. Her young grand daughter and 57 month old grand son are in the home.  PE deferred to limit COVID exposure EXT: no LE edema.    Julianne Handler, NP

## 2019-12-31 ENCOUNTER — Encounter: Payer: Self-pay | Admitting: Internal Medicine

## 2020-01-03 ENCOUNTER — Encounter: Payer: Self-pay | Admitting: General Practice

## 2020-01-03 ENCOUNTER — Telehealth: Payer: Self-pay | Admitting: General Practice

## 2020-01-03 NOTE — Telephone Encounter (Signed)
[place] CSW Progress Notes  Second call from Vernard Gambles - cancels request for help w obtaining Medicaid [place], patient already has an aide in the home.  Edwyna Shell, LCSW Clinical Social Worker Phone:  [phone]

## 2020-01-03 NOTE — Telephone Encounter (Signed)
[place] CSW Progress Notes  VM from Violeta Gelinas, Palliative Care.  States patient is essentially housebound and would benefit from referral for [facility].  Request passed on the A [place], CSW covering at [employer] today, asked to respond to this request.  Edwyna Shell, LCSW Clinical Social Worker Phone:  [phone] Cell:  [phone]

## 2020-01-04 NOTE — Progress Notes (Signed)
Pharmacist Chemotherapy Monitoring - Initial Assessment    Anticipated start date: 01/06/20   Regimen:  . Are orders appropriate based on the patient's diagnosis, regimen, and cycle? Yes . Does the plan date match the patient's scheduled date? Yes . Is the sequencing of drugs appropriate? Yes . Are the premedications appropriate for the patient's regimen? Yes . Prior Authorization for treatment is: Approved o If applicable, is the correct biosimilar selected based on the patient's insurance? yes  Organ Function and Labs: Marland Kitchen Are dose adjustments needed based on the patient's renal function, hepatic function, or hematologic function? No . Are appropriate labs ordered prior to the start of patient's treatment? Yes . Other organ system assessment, if indicated: N/A . The following baseline labs, if indicated, have been ordered: bevacizumab: urine protein -due with next treatment   Dose Assessment: . Are the drug doses appropriate? Yes . Are the following correct: o Drug concentrations Yes o IV fluid compatible with drug Yes o Administration routes Yes o Timing of therapy Yes . If applicable, does the patient have documented access for treatment and/or plans for port-a-cath placement? yes . If applicable, have lifetime cumulative doses been properly documented and assessed? N/A Lifetime Dose Tracking  . Oxaliplatin: 1,024.192 mg/m2 (3,000 mg) = 170.7 % of the maximum lifetime dose of 600 mg/m2  o   Toxicity Monitoring/Prevention: . The patient has the following take home antiemetics prescribed: Ondansetron, Prochlorperazine and Dexamethasone . The patient has the following take home medications prescribed: N/A . Medication allergies and previous infusion related reactions, if applicable, have been reviewed and addressed. Yes . The patient's current medication list has been assessed for drug-drug interactions with their chemotherapy regimen. no significant drug-drug interactions were  identified on review.  Order Review: . Are the treatment plan orders signed? Yes . Is the patient scheduled to see a provider prior to their treatment? Yes  I verify that I have reviewed each item in the above checklist and answered each question accordingly.  Norwood Levo [facility] 01/04/2020 11:18 AM

## 2020-01-05 MED FILL — Dexamethasone Sodium Phosphate Inj 100 MG/10ML: INTRAMUSCULAR | Qty: 1 | Status: AC

## 2020-01-05 NOTE — Telephone Encounter (Signed)
Oral Chemotherapy Pharmacist Encounter  Patient did not want medication delivered to her house. Margaret [name] (patient advocate) will pick up the medication from [facility] and deliver it to the patient when in clinic on Friday 01/06/20.  Patient Education I spoke with patient for overview of new oral chemotherapy medication: Xeloda (capecitabine) for the treatment of metastatic colon cancer in conjunction with bevacizumab and irinotecan, planned duration until disease progression or unacceptable drug toxicity.   Counseled patient on administration, dosing, side effects, monitoring, drug-food interactions, safe handling, storage, and disposal. Patient will take 4 tablets (2,000 mg total) by mouth 2 (two) times daily after a meal for 14 days. Take 14 days on, 7 days off, repeat every 21 days.  Side effects include but not limited to: diarrhea, hand-foot syndrome, N/V, edema.    Reviewed with patient importance of keeping a medication schedule and plan for any missed doses.  Margaret [name] voiced understanding and appreciation. All questions answered. Medication handout and calendar will be provided to patient while in clinic on Friday 01/06/20.  Provided patient with Oral [facility] phone number. Patient knows to call the office with questions or concerns. Oral Chemotherapy Navigation Clinic will continue to follow.  Darl Pikes, PharmD, BCPS, BCOP, CPP Hematology/Oncology Clinical Pharmacist [facility]/HP/AP [facility] [phone]  01/05/2020 10:21 AM

## 2020-01-05 NOTE — Progress Notes (Signed)
HEMATOLOGY/ONCOLOGY CLINIC NOTE  Date of Service: 01/05/2020  Patient Care Team: Nolene Ebbs, MD as PCP - General (Internal Medicine) Jackelyn Knife, MD as Rounding Team (Internal Medicine) Julianne Handler, NP as Nurse Practitioner (Hospice and Palliative Medicine)   CHIEF COMPLAINTS/PURPOSE OF CONSULTATION:   Continued mx of Metastatic Adenocarcinoma of colon   HISTORY OF PRESENTING ILLNESS:   Margaret [name] is a wonderful 49 y.o. female who has been referred to [place] by Dr. Nolene Ebbs for evaluation and management of Iron deficiency anemia. The pt reports that she is doing well overall.   The pt recently presented to the ED on 04/16/18 for right sided abdominal pain that was evaluated with a CT A/P which revealed liver and pancreatic cyst, recommended for outpatient MRI follow up. The pt was found to have a UTI, was treated with Rocephin and discharged with Keflex. Prior to this the pt was admitted on 04/09/18 for symptomatic anemia, presenting with HGB at 6.2, received 2 units PRBCs, one IV Iron infusion, and was encouraged to seek out GI as outpatient.  She has an appointment with [facility] on 05/19/18.   The pt reports that she is still feeling dizzy and light headed and denies feeling better after her recent blood transfusion. She notes that she dizziness presents with a feeling of warmness and that she begins hyperventilating, feeling nervous about her dizziness.  She notes that prior to her recent hospital stay, she never required a blood transfusion or IV iron replacement. She notes that as a child she had frequent nose bleeds, and was diagnosed with anemia. She also notes a history of ice picca.   She had a hysterectomy in 2012, and prior to this she had very heavy periods that began at age 50 and occurred almost constantly. She was on Depo and Mirena which did not help slow menstrual losses. Besides taking iron supplements while pregnant she has not taken PO iron  replacement as she reports not tolerating it very well while pregnant.   She denies concern for black stools or blood in the stools and has not had a colonoscopy or endoscopy before. The pt notes that she has not previously had a concern for stomach ulcers, but has acid reflux and takes Prevacid as needed for 7-8 years. She notes that she has not recently used Prevacid, for the last 3 months.  She is not taking vitamin replacements and denies any dietary restrictions.  The pt notes that she has been continuing to have lower right quadrant abdominal pain that radiates across her abdomen, presents for up to 45 minutes and occurs intermittently. She notes that her abdominal pain presented on 04/13/18.   The pt notes that was taking Ibuprofen '800mg'$  BID for 8 years to address her back pain related to her herniated disk. She stopped taking this 4 months ago.   Of note prior to the patient's visit today, pt has had CT A/P completed on 04/16/18 with results revealing No acute findings in the abdomen/pelvis. Least 4 low-density hepatic lesions, some of which may represent small cysts or hemangiomas, however there is a 19 mm lesion in the left lobe of the liver that is incompletely characterized. Recommend nonemergent hepatic protocol MRI for further evaluation.Marland Kitchen Possible 11 mm pancreatic nodule rising from the tail, alternatively this may represent a splenule. This can also be assessed at time of hepatic MRI. Gallstone without gallbladder inflammation. Colonic diverticulosis without diverticulitis.   Most recent lab results (04/16/18) of CBC  is as follows: all values are WNL except for HGB at 9.5, HCT at 33.6, MCH at 23.8, MCHC at 28.3, RDW at 23.2, PLT at 435k. Ferritin 04/09/18 was low at 6 Vitamin B12 on 04/09/18 was at 196  On review of systems, pt reports light headedness, dizziness, lower back pain, intermittent abdominal pain, and denies black stools, blood in the stools, tingling or numbness in her legs or  arms, vaginal bleeding, and any other symptoms.   INTERVAL HISTORY:   Margaret [name] returns today for management, evaluation of her Metastatic Adenocarcinoma of GI primary. The patient's last visit with [place] was on 12/21/19. The pt reports that she is doing well overall.  The pt reports after the blood transfusion she was good for about a week. Pt gets SOB, weak and lightheaded when she is moving around. She has not had any appetite, she has not eaten anything since Tuesday. When pt eats she has stomach pain. Pt feels depression could play a part in not wanting to eat. Often she is hungry but once she gets food she doesn't want to eat it. She is having extreme pain in back. Pt gets short of breath just walking around the house during the day. She is taking Vitamin B12.   Lab results today (01/06/20) of CBC w/diff and CMP is as follows: all values are WNL except for RBC at 3.38, Hemoglobin at 9.1, HCT at 31.8, MCHC at 28.6, RDW at 17.8, Monocytes Absolute at 1.3K, Sodium at 147, Glucose at 105, Creatinine at 1.13, Albumin at 2.4, GFR, Est Non Af Am at 57 01/06/20 of CE[facility] (In [facility]) at 12.17  On review of systems, pt reports SOB, weak, lightheaded, headache, migraines, fatigued, back pain and denies irregular bowl movements, nauseous, fever, chills, new cough, abdominal pain, blood/black stool, calf/thigh pain and any other symptoms.   MEDICAL HISTORY:  Past Medical History:  Diagnosis Date  . Allergic rhinitis   . Anxiety diagnosed in 1990  . Arthritis   . Asthma   . colon ca with liver mets dx'd 08/2018  . Dyspnea    with low blood count hx of anemia  . Eczema   . History of blood transfusion   . Hypertension   . Lower back pain   . Migraine   . Sleep apnea    uses CPAP    SURGICAL HISTORY: Past Surgical History:  Procedure Laterality Date  . ABDOMINAL HYSTERECTOMY    . cortisone injections     knees bilat and back   . IR IMAGING GUIDED PORT INSERTION  09/14/2018  .  iron infusion    . LIVER BIOPSY      SOCIAL HISTORY: Social History   Socioeconomic History  . Marital status: Single    Spouse name: Not on file  . Number of children: Not on file  . Years of education: Not on file  . Highest education level: Not on file  Occupational History  . Not on file  Tobacco Use  . Smoking status: Former Smoker    Quit date: 07/30/1999    Years since quitting: 20.4  . Smokeless tobacco: Never Used  Substance and Sexual Activity  . Alcohol use: No  . Drug use: No  . Sexual activity: Not on file  Other Topics Concern  . Not on file  Social History Narrative  . Not on file   Social Determinants of Health   Financial Resource Strain:   . Difficulty of Paying Living Expenses:  Food Insecurity:   . Worried About Charity fundraiser in the Last Year:   . Arboriculturist in the Last Year:   Transportation Needs:   . Film/video editor (Medical):   Marland Kitchen Lack of Transportation (Non-Medical):   Physical Activity:   . Days of Exercise per Week:   . Minutes of Exercise per Session:   Stress:   . Feeling of Stress :   Social Connections:   . Frequency of Communication with Friends and Family:   . Frequency of Social Gatherings with Friends and Family:   . Attends Religious Services:   . Active Member of Clubs or Organizations:   . Attends Archivist Meetings:   Marland Kitchen Marital Status:   Intimate Partner Violence:   . Fear of Current or Ex-Partner:   . Emotionally Abused:   Marland Kitchen Physically Abused:   . Sexually Abused:     FAMILY HISTORY: Family History  Problem Relation Age of Onset  . Diabetes Father     ALLERGIES:  is allergic to latex; tape; and tylenol with codeine #3 [acetaminophen-codeine].  MEDICATIONS:  Current Outpatient Medications  Medication Sig Dispense Refill  . albuterol (PROAIR HFA) 108 (90 Base) MCG/ACT inhaler INHALE TWO puffs BY MOUTH into the lungs EVERY 4 HOURS AS NEEDED FOR WHEEZING OR shortness of breath 18 g 1    . Boric Acid 4 % SOLN Place 1 capsule vaginally every Friday.     . budesonide-formoterol (SYMBICORT) 160-4.5 MCG/ACT inhaler Inhale 2 puffs into the lungs 2 (two) times a day. 10.2 g 5  . cetirizine (ZYRTEC) 10 MG tablet Take 1 tablet (10 mg total) by mouth daily. 30 tablet 5  . clobetasol ointment (TEMOVATE) 5.40 % Apply 1 application topically 2 (two) times daily. Use for Lichen Sclerosis    . Cyanocobalamin (B-12) 1000 MCG SUBL Place 1,000 mcg under the tongue daily. 30 each 3  . dexamethasone (DECADRON) 4 MG tablet TAKE 2 TABLETS (8 MG TOTAL) BY MOUTH DAILY  START THE DAY AFTER CHEMOTHERAPY FOR 2 DAYS. TAKE WITH FOOD. 30 tablet 1  . diazepam (VALIUM) 5 MG tablet Take 1 tablet (5 mg total) by mouth daily as needed for anxiety (panic attacks). 30 tablet 0  . diclofenac sodium (VOLTAREN) 1 % GEL Apply 2 g topically 4 (four) times daily as needed (for back/knee pain.).     Marland Kitchen dicyclomine (BENTYL) 20 MG tablet Take 1 tablet (20 mg total) by mouth 2 (two) times daily. (Patient taking differently: Take 20 mg by mouth 3 (three) times daily before meals. ) 20 tablet 0  . dronabinol (MARINOL) 5 MG capsule Take 1 capsule (5 mg total) by mouth 2 (two) times daily before a meal. 60 capsule 0  . ergocalciferol (VITAMIN D2) 50000 UNITS capsule Take 50,000 Units by mouth every Monday.     . lidocaine-prilocaine (EMLA) cream Apply to affected area once 30 g 3  . lisinopril-hydrochlorothiazide (PRINZIDE,ZESTORETIC) 20-12.5 MG tablet Take 1 tablet by mouth daily.    . methocarbamol (ROBAXIN) 750 MG tablet Take 750 mg by mouth every 8 (eight) hours as needed (back spasms.).     Marland Kitchen montelukast (SINGULAIR) 10 MG tablet Take 1 tablet (10 mg total) by mouth at bedtime. 30 tablet 6  . nystatin-triamcinolone (MYCOLOG II) cream Apply 1 application topically 2 (two) times daily as needed (irritation). Use in skin folds and under breast     . Olopatadine HCl (PAZEO) 0.7 % SOLN Apply 1 drop to eye  daily. 2.5 mL 5  .  ondansetron (ZOFRAN) 8 MG tablet Take 1 tablet (8 mg total) by mouth 2 (two) times daily as needed for refractory nausea / vomiting. Start on day 3 after chemotherapy. 30 tablet 0  . pantoprazole (PROTONIX) 40 MG tablet Take 1 tablet (40 mg total) by mouth daily before breakfast. 30 tablet 1  . pimecrolimus (ELIDEL) 1 % cream Apply 1 application topically 2 (two) times daily. 30 g 6  . prochlorperazine (COMPAZINE) 10 MG tablet Take 1 tablet (10 mg total) by mouth every 6 (six) hours as needed (Nausea or vomiting). 30 tablet 1  . saccharomyces boulardii (FLORASTOR) 250 MG capsule Take 1 capsule (250 mg total) by mouth 2 (two) times daily.    . traMADol (ULTRAM) 50 MG tablet Take 1-2 tablets (50-100 mg total) by mouth every 6 (six) hours as needed for moderate pain or severe pain. 90 tablet 0  . triamcinolone (NASACORT) 55 MCG/ACT AERO nasal inhaler Place 2 sprays into the nose daily as needed (for allergies). 1 Inhaler 5  . valACYclovir (VALTREX) 500 MG tablet Take 500 mg by mouth 2 (two) times daily as needed (for cold sores).      No current facility-administered medications for this visit.    Facility-Administered Medications Ordered in Other Visits  Medication Dose Route Frequency Provider Last Rate Last Dose  . fluorouracil (ADRUCIL) 6,950 mg in sodium chloride 0.9 % 111 mL chemo infusion  2,400 mg/m2 (Treatment Plan Recorded) Intravenous 1 day or 1 dose Brunetta Genera, MD      . leucovorin 1,156 mg in dextrose 5 % 250 mL infusion  400 mg/m2 (Treatment Plan Recorded) Intravenous Once Brunetta Genera, MD 77 mL/hr at 05/24/19 1242 1,156 mg at 05/24/19 1242  . oxaliplatin (ELOXATIN) 250 mg in dextrose 5 % 500 mL chemo infusion  250 mg Intravenous Once Brunetta Genera, MD 138 mL/hr at 05/24/19 1245 250 mg at 05/24/19 1245    REVIEW OF SYSTEMS:   A 10+ POINT REVIEW OF SYSTEMS WAS OBTAINED including neurology, dermatology, psychiatry, cardiac, respiratory, lymph, extremities, GI, GU,  Musculoskeletal, constitutional, breasts, reproductive, HEENT.  All pertinent positives are noted in the HPI.  All others are negative.   PHYSICAL EXAMINATION:  ECOG FS:2 - Symptomatic, <50% confined to bed  There were no vitals filed for this visit. Wt Readings from Last 3 Encounters:  12/21/19 (!) 377 lb 3.2 oz (171.1 kg)  12/07/19 (!) 377 lb 8 oz (171.2 kg)  11/23/19 (!) 364 lb 4 oz (165.2 kg)   There is no height or weight on file to calculate BMI.    GENERAL:alert, in no acute distress and comfortable SKIN: no acute rashes, no significant lesions EYES: conjunctiva are pink and non-injected, sclera anicteric OROPHARYNX: MMM, no exudates, no oropharyngeal erythema or ulceration NECK: supple, no JVD LYMPH:  no palpable lymphadenopathy in the cervical, axillary or inguinal regions LUNGS: clear to auscultation b/l with normal respiratory effort HEART: regular rate & rhythm ABDOMEN:  normoactive bowel sounds , non tender, not distended. Extremity: no pedal edema PSYCH: alert & oriented x 3 with fluent speech NEURO: no focal motor/sensory deficits  LABORATORY DATA:  I have reviewed the data as listed  . CBC Latest Ref Rng & Units 12/21/2019 12/07/2019 11/23/2019  WBC 4.0 - 10.5 K/uL 9.2 12.2(H) 8.3  Hemoglobin 12.0 - 15.0 g/dL 8.4(L) 9.3(L) 7.9(L)  Hematocrit 36.0 - 46.0 % 29.3(L) 31.7(L) 27.8(L)  Platelets 150 - 400 K/uL 390 355 307  [place] [address] . CMP Latest Ref Rng & Units 12/21/2019 12/07/2019 11/23/2019  Glucose 70 - 99 mg/dL 100(H) 96 93  BUN 6 - 20 mg/dL '17 14 10  '$ Creatinine 0.44 - 1.00 mg/dL 1.37(H) 0.96 0.92  Sodium 135 - 145 mmol/L 145 143 144  Potassium 3.5 - 5.1 mmol/L 3.8 3.6 3.6  Chloride 98 - 111 mmol/L 112(H) 112(H) 111  CO2 22 - 32 mmol/L '24 25 24  '$ Calcium 8.9 - 10.3 mg/dL 9.0 8.8(L) 8.7(L)  Total Protein 6.5 - 8.1 g/dL 7.4 7.1 7.0  Total Bilirubin 0.3 - 1.2 mg/dL 0.3 0.3 0.4  Alkaline Phos 38 - 126 U/L 118 129(H) 138(H)  AST 15 - 41 U/L 14(L) 21 27  ALT 0 - 44  U/L '9 13 16   '$ . Lab Results  Component Value Date   IRON 40 (L) 12/07/2019   TIBC 199 (L) 12/07/2019   IRONPCTSAT 20 (L) 12/07/2019   (Iron and TIBC)  Lab Results  Component Value Date   FERRITIN 716 (H) 12/07/2019   B12  - 196--> 584  07/22/18 Liver Biopsy:   08/27/18 Repeat Liver Biopsy:   Molecular Pathology:     RADIOGRAPHIC STUDIES: I have personally reviewed the radiological images as listed and agreed with the findings in the report. No results found.  ASSESSMENT & PLAN:   49 y.o. female with  1. Iron Deficiency Anemia - ? Previous heavy periods vs GI losses  Recent heavy NSAID use ? Ulcer  2. B12 deficiency Labs upon initial presentation from 04/16/18, HGB at 9.5. The 04/09/18 Ferritin was low at 6 and Vitamin B12 was at 196 -No antiparietal antibodies, no intrinsic factor antibodies, normal TSH all from 04/28/18  PLAN: -Continue NSAID avoidance  -continue 1052mg Vitamin B12 sublingually daily - B12 improved from 196 to 584  3.MetastaticAdenocarcinoma of cecum with liver mets. KRAS mutated.  04/16/18 CT A/P with pt revealed No acute findings in the abdomen/pelvis. Least 4 low-density hepatic lesions, some of which may represent small cysts or hemangiomas, however there is a 19 mm lesion in the left lobe of the liver that is incompletely characterized. Recommend nonemergent hepatic protocol MRI for further evaluation..Marland KitchenPossible 11 mm pancreatic nodule rising from the tail, alternatively this may represent a splenule. This can also be assessed at time of hepatic MRI. Gallstone without gallbladder inflammation. Colonic diverticulosis without diverticulitis.   07/09/18 Tumor marker work up: CEA normal at 1.32, AFP normal at 1.5, LDH normal at 138, CA 125 normal at 5.9, CA 19-9 normal at <2   07/22/18 Liver biopsy results revealed benign hepatic tissue, and was not diagnostic   07/16/18 PET/CT revealedThere is a solid hypermetabolic mass involving the  cecum, worrisome for colonic primary neoplasm. Correlation withcolonoscopy Findings. 2. Multiple hypermetabolic liver lesions compatible with metastaticdisease. 3. Large low-density lesion in right lobe of thyroid gland. Advise further evaluation with thyroid sonography. 4. Aortic Atherosclerosis. 5. Small hiatal hernia.   08/27/18 Liver biopsy revealed Metastatic adenocarcinoma, consistent with gastrointestinal primary 08/27/18 Molecular pathology- KRAS mutation positive   09/03/18 ECHO revealed an LV EF of 600-34% mild diastolic dysfunction; mild RVE; dilated PA; mild TR; moderate to severe pulmonary hypertension  09/17/18 CEA at 2.39, which was the last CEA pre-treatment value   12/13/18 CT A/P revealed Interval response to therapy. There is been decrease in size of multifocal liver metastasis. No new or progressive findings. 2. Hiatal hernia.   Did postpone C6 by one week due to concern for mild infection.  Did postpone C7 by two weeks for infection  06/15/2019 CT C/A/P ([id number]) ([id number]) revealed "1. Significant interval enlargement of ill-defined, hypodense masses of the liver, the largest index mass of the inferior right lobe, segment VI, measuring 4.1 x 3.7 cm, previously 1.0 cm when measured similarly (series 2, image 58). There are additional lesions in caudate (series 2, image 54), lateral right lobe (series 2, image 52) and liver dome (series 2, image 39), all substantially enlarged compared to prior examination. Findings are consistent with worsened metastatic disease. 2. Redemonstrated residual mass at the cecal base appears thickened in comparison to prior examination, approximately 1.2 cm, previously 5 mm when measured similarly (series 4, image 61, series 2, image 110). 3. Stable 2 mm nodules of the left lung base (series 6, image 105, 116), likely benign and incidental. No definite evidence of metastatic disease in the chest. Attention on follow-up."1. Significant interval  enlargement of ill-defined, hypodense masses of the liver, the largest index mass of the inferior right lobe, segment VI, measuring 4.1 x 3.7 cm, previously 1.0 cm when measured similarly (series 2, image 58). There are additional lesions in caudate (series 2, image 54), lateral right lobe (series 2, image 52) and liver dome (series 2, image 39), all substantially enlarged compared to prior examination. Findings are consistent with worsened metastatic disease. 2. Redemonstrated residual mass at the cecal base appears thickened in comparison to prior examination, approximately 1.2 cm, previously 5 mm when measured similarly (series 4, image 61, series 2, image 110). 3. Stable 2 mm nodules of the left lung base (series 6, image 105, 116), likely benign and incidental. No definite evidence of metastatic disease in the chest. Attention on follow-up."  10/27/2019 CT Abd/Pel ([id number]) revealed fairly significant progression in the cecum and liver, stable pulmonary nodules   PLAN: -Discussed pt labwork today, 01/06/20; of CBC w/diff and CMP is as follows: all values are WNL except for RBC at 3.38, Hemoglobin at 9.1, HCT at 31.8, MCHC at 28.6, RDW at 17.8, Monocytes Absolute at 1.3K, Sodium at 147, Glucose at 105, Creatinine at 1.13, Albumin at 2.4, GFR, Est Non Af Am at 57 -[facility] -Advised on SOB  -Advised on back pain  -Advised on pt not eating -fatigue, deconditioning  -Advised if not functioning well treatment may need to change- chemotherapy vs BSC -Advised pt that her fatigue is multifactorial including: chemotherapy, anemia, depression, malnutrition, being sedentary, and disease.  -Advised pt that she needs to eat as much as possible.  -Recommends staying hydrated  -Recommends getting CT scan today  -Will wait on chemotherapy treatment until pt feels better  FOLLOW UP: Patient unable to get outpatient CTA chest and was sent to ED for further  evaluation of her SOB  The total time spent in the appt was 20 minutes and more than 50% was on counseling and direct patient cares.  All of the patient's questions were answered with apparent satisfaction. The patient knows to call the clinic with any problems, questions or concerns.  Sullivan Lone MD MS AAHIVMS [facility] Hematology/Oncology Physician [facility]  (Office):       [phone] (Work cell):  [phone] (Fax):           [phone]  01/05/2020 3:13 PM   I, Dawayne Cirri am acting as a Education administrator for Dr. Sullivan Lone.   .I have reviewed the above documentation for accuracy and completeness, and I agree with the above. Cloria Spring  Irene Limbo MD

## 2020-01-06 ENCOUNTER — Inpatient Hospital Stay (HOSPITAL_COMMUNITY)
Admission: EM | Admit: 2020-01-06 | Discharge: 2020-01-10 | DRG: 205 | Disposition: A | Discharge status: Home Health | Payer: Medicaid Other | Source: Ambulatory Visit | Attending: Internal Medicine | Admitting: Internal Medicine

## 2020-01-06 ENCOUNTER — Inpatient Hospital Stay: Payer: Medicaid Other

## 2020-01-06 ENCOUNTER — Inpatient Hospital Stay (HOSPITAL_BASED_OUTPATIENT_CLINIC_OR_DEPARTMENT_OTHER): Payer: Medicaid Other | Admitting: Hematology

## 2020-01-06 ENCOUNTER — Emergency Department (HOSPITAL_COMMUNITY): Payer: Medicaid Other

## 2020-01-06 ENCOUNTER — Other Ambulatory Visit: Payer: Self-pay

## 2020-01-06 ENCOUNTER — Other Ambulatory Visit: Payer: Self-pay | Admitting: *Deleted

## 2020-01-06 VITALS — BP 145/95 | HR 108 | Temp 98.2°F | Resp 20 | Ht 68.0 in

## 2020-01-06 DIAGNOSIS — Z9221 Personal history of antineoplastic chemotherapy: Secondary | ICD-10-CM

## 2020-01-06 DIAGNOSIS — D509 Iron deficiency anemia, unspecified: Secondary | ICD-10-CM

## 2020-01-06 DIAGNOSIS — Z833 Family history of diabetes mellitus: Secondary | ICD-10-CM

## 2020-01-06 DIAGNOSIS — Z91040 Latex allergy status: Secondary | ICD-10-CM

## 2020-01-06 DIAGNOSIS — J45909 Unspecified asthma, uncomplicated: Secondary | ICD-10-CM | POA: Diagnosis present

## 2020-01-06 DIAGNOSIS — Z90710 Acquired absence of both cervix and uterus: Secondary | ICD-10-CM

## 2020-01-06 DIAGNOSIS — G894 Chronic pain syndrome: Secondary | ICD-10-CM | POA: Diagnosis present

## 2020-01-06 DIAGNOSIS — I10 Essential (primary) hypertension: Secondary | ICD-10-CM | POA: Diagnosis not present

## 2020-01-06 DIAGNOSIS — R0602 Shortness of breath: Secondary | ICD-10-CM

## 2020-01-06 DIAGNOSIS — Z9989 Dependence on other enabling machines and devices: Secondary | ICD-10-CM

## 2020-01-06 DIAGNOSIS — J9811 Atelectasis: Secondary | ICD-10-CM | POA: Diagnosis present

## 2020-01-06 DIAGNOSIS — J9601 Acute respiratory failure with hypoxia: Secondary | ICD-10-CM | POA: Diagnosis not present

## 2020-01-06 DIAGNOSIS — R5381 Other malaise: Secondary | ICD-10-CM | POA: Diagnosis present

## 2020-01-06 DIAGNOSIS — I5032 Chronic diastolic (congestive) heart failure: Secondary | ICD-10-CM | POA: Diagnosis present

## 2020-01-06 DIAGNOSIS — Z7189 Other specified counseling: Secondary | ICD-10-CM

## 2020-01-06 DIAGNOSIS — C787 Secondary malignant neoplasm of liver and intrahepatic bile duct: Secondary | ICD-10-CM

## 2020-01-06 DIAGNOSIS — C189 Malignant neoplasm of colon, unspecified: Secondary | ICD-10-CM

## 2020-01-06 DIAGNOSIS — Z7951 Long term (current) use of inhaled steroids: Secondary | ICD-10-CM

## 2020-01-06 DIAGNOSIS — Z95828 Presence of other vascular implants and grafts: Secondary | ICD-10-CM

## 2020-01-06 DIAGNOSIS — J4520 Mild intermittent asthma, uncomplicated: Secondary | ICD-10-CM

## 2020-01-06 DIAGNOSIS — D849 Immunodeficiency, unspecified: Secondary | ICD-10-CM | POA: Diagnosis present

## 2020-01-06 DIAGNOSIS — Z6841 Body Mass Index (BMI) 40.0 and over, adult: Secondary | ICD-10-CM

## 2020-01-06 DIAGNOSIS — E662 Morbid (severe) obesity with alveolar hypoventilation: Principal | ICD-10-CM | POA: Diagnosis present

## 2020-01-06 DIAGNOSIS — Z87891 Personal history of nicotine dependence: Secondary | ICD-10-CM

## 2020-01-06 DIAGNOSIS — F419 Anxiety disorder, unspecified: Secondary | ICD-10-CM

## 2020-01-06 DIAGNOSIS — R2689 Other abnormalities of gait and mobility: Secondary | ICD-10-CM | POA: Diagnosis present

## 2020-01-06 DIAGNOSIS — Z79899 Other long term (current) drug therapy: Secondary | ICD-10-CM

## 2020-01-06 DIAGNOSIS — Z20822 Contact with and (suspected) exposure to covid-19: Secondary | ICD-10-CM | POA: Diagnosis present

## 2020-01-06 DIAGNOSIS — D649 Anemia, unspecified: Secondary | ICD-10-CM | POA: Diagnosis present

## 2020-01-06 DIAGNOSIS — Z5111 Encounter for antineoplastic chemotherapy: Secondary | ICD-10-CM

## 2020-01-06 DIAGNOSIS — G4733 Obstructive sleep apnea (adult) (pediatric): Secondary | ICD-10-CM

## 2020-01-06 DIAGNOSIS — I110 Hypertensive heart disease with heart failure: Secondary | ICD-10-CM | POA: Diagnosis present

## 2020-01-06 DIAGNOSIS — D630 Anemia in neoplastic disease: Secondary | ICD-10-CM | POA: Diagnosis present

## 2020-01-06 LAB — CMP (CANCER CENTER ONLY)
ALT: 13 U/L (ref 0–44)
AST: 27 U/L (ref 15–41)
Albumin: 2.4 g/dL — ABNORMAL LOW (ref 3.5–5.0)
Alkaline Phosphatase: 111 U/L (ref 38–126)
Anion gap: 11 (ref 5–15)
BUN: 14 mg/dL (ref 6–20)
CO2: 26 mmol/L (ref 22–32)
Calcium: 9.4 mg/dL (ref 8.9–10.3)
Chloride: 110 mmol/L (ref 98–111)
Creatinine: 1.13 mg/dL — ABNORMAL HIGH (ref 0.44–1.00)
GFR, Est AFR Am: >60 mL/min (ref >60)
GFR, Estimated: 57 mL/min — ABNORMAL LOW (ref >60)
Glucose, Bld: 105 mg/dL — ABNORMAL HIGH (ref 70–99)
Potassium: 3.7 mmol/L (ref 3.5–5.1)
Sodium: 147 mmol/L — ABNORMAL HIGH (ref 135–145)
Total Bilirubin: 0.5 mg/dL (ref 0.3–1.2)
Total Protein: 7.6 g/dL (ref 6.5–8.1)

## 2020-01-06 LAB — CBC WITH DIFFERENTIAL/PLATELET
Abs Immature Granulocytes: 0.04 10*3/uL (ref 0.00–0.07)
Basophils Absolute: 0 10*3/uL (ref 0.0–0.1)
Basophils Relative: 0 %
Eosinophils Absolute: 0.1 10*3/uL (ref 0.0–0.5)
Eosinophils Relative: 1 %
HCT: 31.8 % — ABNORMAL LOW (ref 36.0–46.0)
Hemoglobin: 9.1 g/dL — ABNORMAL LOW (ref 12.0–15.0)
Immature Granulocytes: 0 %
Lymphocytes Relative: 23 %
Lymphs Abs: 2.3 10*3/uL (ref 0.7–4.0)
MCH: 26.9 pg (ref 26.0–34.0)
MCHC: 28.6 g/dL — ABNORMAL LOW (ref 30.0–36.0)
MCV: 94.1 fL (ref 80.0–100.0)
Monocytes Absolute: 1.3 10*3/uL — ABNORMAL HIGH (ref 0.1–1.0)
Monocytes Relative: 12 %
Neutro Abs: 6.5 10*3/uL (ref 1.7–7.7)
Neutrophils Relative %: 64 %
Platelets: 278 10*3/uL (ref 150–400)
RBC: 3.38 MIL/uL — ABNORMAL LOW (ref 3.87–5.11)
RDW: 17.8 % — ABNORMAL HIGH (ref 11.5–15.5)
WBC: 10.3 10*3/uL (ref 4.0–10.5)
nRBC: 0 % (ref 0.0–0.2)

## 2020-01-06 LAB — TYPE AND SCREEN
ABO/RH(D): A POS
Antibody Screen: NEGATIVE

## 2020-01-06 LAB — TROPONIN I (HIGH SENSITIVITY)
Troponin I (High Sensitivity): 8 ng/L (ref <18)
Troponin I (High Sensitivity): 6 ng/L (ref <18)

## 2020-01-06 LAB — CEA (IN HOUSE-CHCC): CEA (CHCC-In House): 12.17 ng/mL — ABNORMAL HIGH (ref 0.00–5.00)

## 2020-01-06 MED ORDER — SODIUM CHLORIDE 0.9% FLUSH
10.0000 mL | Freq: Once | INTRAVENOUS | Status: AC
  Administered 2020-01-06: 10 mL
  Filled 2020-01-06: qty 10

## 2020-01-06 MED ORDER — SODIUM CHLORIDE 0.9 % IV BOLUS
500.0000 mL | Freq: Once | INTRAVENOUS | Status: AC
  Administered 2020-01-06: 500 mL via INTRAVENOUS

## 2020-01-06 MED ORDER — IOHEXOL 350 MG/ML SOLN
100.0000 mL | Freq: Once | INTRAVENOUS | Status: AC | PRN
  Administered 2020-01-06: 100 mL via INTRAVENOUS

## 2020-01-06 MED ORDER — HEPARIN SOD (PORK) LOCK FLUSH 100 UNIT/ML IV SOLN
500.0000 [IU] | Freq: Once | INTRAVENOUS | Status: AC
  Administered 2020-01-06: 500 [IU]
  Filled 2020-01-06: qty 5

## 2020-01-06 MED ORDER — ALBUTEROL SULFATE HFA 108 (90 BASE) MCG/ACT IN AERS
4.0000 | INHALATION_SPRAY | Freq: Once | RESPIRATORY_TRACT | Status: AC
  Administered 2020-01-06: 4 via RESPIRATORY_TRACT
  Filled 2020-01-06: qty 6.7

## 2020-01-06 MED FILL — ANTI-DIARRHEAL 2 MG CAPLET: 2 | 10 days supply | Qty: 36 | Fill #0

## 2020-01-06 MED FILL — CAPECITABINE 500 MG TABLET: 500 | 21 days supply | Qty: 112 | Fill #0

## 2020-01-06 NOTE — Progress Notes (Signed)
Verbal order - Dr.Kale: Take patient to [facility] ED for shortness of breath (r/o PE), dizziness, abd/chest pain. O2 sat 100% on RA. AO x3.  1555: Transported to [place] 8 in wheelchair. Report to [place], Therapist, sports.

## 2020-01-06 NOTE — ED Notes (Signed)
Patient transported to CT 

## 2020-01-06 NOTE — ED Notes (Signed)
Pt provided [place] sandwich, juice, and apple sauce.  EDP Kelly aware.

## 2020-01-06 NOTE — ED Triage Notes (Signed)
Pt from cancer center- had labs earlier today.   Per [facility] RN- hx of colon cancer, mets to liver and elsewhere.  MD concerned pt has PE d/t chemo meds shes taking.   Pt c/o SHOB, poor PO intake x 3-4 days.

## 2020-01-06 NOTE — ED Notes (Signed)
Attempted to walk pt in the room. Pt became very SOB and o2 sats dropped to 70's while ambulating and HR in the 120's-130's. PA notified. Will continue to monitor pt.

## 2020-01-06 NOTE — ED Notes (Signed)
Pt lying in bed watching t.v. NAD noted. Family at bedside. Will continue to monitor.

## 2020-01-06 NOTE — ED Provider Notes (Signed)
[name] DEPT Provider Note   CSN: [id number] Arrival date & time: 01/06/20  1604     History Chief Complaint  Patient presents with  . Shortness of Breath    Margaret [name] is a 49 y.o. female with history of colon cancer with mets to the liver currently undergoing chemotherapy, anemia requiring multiple blood transfusions, and asthma who presents with shortness of breath and lightheadedness.  Patient states that she has not been able to do chemo for the past couple sessions due to having low blood counts and requiring blood transfusions.  She went to have chemotherapy today however she became so short of breath and lightheaded when they were getting her weight and vital signs that they wanted her to come to the ED instead to rule out a blood clot.  She states the shortness of breath is usually with exertion or positional changes.  She thought it may be her asthma but she used her inhaler without relief.  She denies fever, chills, chest pain, cough, abdominal pain, nausea, vomiting, diarrhea, bloody stools.  She has some chest tightness when the shortness of breath becomes bad.  She has not been able to eat much but reports she has been staying hydrated.  Labs were already drawn today at the cancer center.  She is not on any blood thinners. She had COVID last November.   HPI     Past Medical History:  Diagnosis Date  . Allergic rhinitis   . Anxiety diagnosed in 1990  . Arthritis   . Asthma   . colon ca with liver mets dx'd 08/2018  . Dyspnea    with low blood count hx of anemia  . Eczema   . History of blood transfusion   . Hypertension   . Lower back pain   . Migraine   . Sleep apnea    uses CPAP    Patient Active Problem List   Diagnosis Date Noted  . Chemotherapy-induced neutropenia ([place]) 10/06/2019  . Pulmonary hypertension, unspecified ([place]) 10/06/2019  . Morbid obesity with BMI of 40.0-44.9, adult ([place]) 08/29/2019  . Vaginal  trichomoniasis 08/29/2019  . Blood in urine 08/29/2019  . Candidiasis of skin 08/29/2019  . Disorder of vulva 08/29/2019  . Herpes simplex 08/29/2019  . Lichen sclerosus et atrophicus 08/29/2019  . Menopausal problem 08/29/2019  . Menopausal syndrome 08/29/2019  . Spasm of back muscles 08/29/2019  . Vaginitis 08/29/2019  . Vaginal irritation 08/29/2019  . Port-A-Cath in place 11/03/2018  . Counseling regarding advance care planning and goals of care 09/06/2018  . Colon cancer metastasized to liver ([place])   . Hypokalemia 09/01/2018  . Aspiration pneumonia ([place]) 08/30/2018  . Subcapsular hematoma of liver 08/27/2018  . Obesity, Class III, BMI 40-49.9 (morbid obesity) ([place]) 08/27/2018  . Metastatic carcinoma to liver ([place]) 08/27/2018  . B12 deficiency anemia 08/27/2018  . Dizziness   . Acute blood loss anemia   . Thyroid mass 08/10/2018  . Epistaxis 08/10/2018  . Iron deficiency anemia 04/28/2018  . Symptomatic anemia 04/09/2018  . Chest pain 04/09/2018  . Anxiety   . Asthma   . Hypertension   . Migraine     Past Surgical History:  Procedure Laterality Date  . ABDOMINAL HYSTERECTOMY    . cortisone injections     knees bilat and back   . IR IMAGING GUIDED PORT INSERTION  09/14/2018  . iron infusion    . LIVER BIOPSY       OB History  No obstetric history on file.     Family History  Problem Relation Age of Onset  . Diabetes Father     Social History   Tobacco Use  . Smoking status: Former Smoker    Quit date: 07/30/1999    Years since quitting: 20.4  . Smokeless tobacco: Never Used  Substance Use Topics  . Alcohol use: No  . Drug use: No    Home Medications Prior to Admission medications   Medication Sig Start Date End Date Taking? Authorizing Provider  albuterol (PROAIR HFA) 108 (90 Base) MCG/ACT inhaler Inhale 2 puffs into the lungs every 4 (four) hours as needed for wheezing or shortness of breath. 08/30/19  Yes Valentina Shaggy, MD  ascorbic  acid (VITAMIN C) 500 MG tablet Take 500 mg by mouth daily. 09/22/19  Yes [provider]  Boric Acid 4 % SOLN Place 1 capsule vaginally every Friday.    Yes [provider]  budesonide-formoterol (SYMBICORT) 160-4.5 MCG/ACT inhaler Inhale 2 puffs into the lungs 2 (two) times daily as needed (sob/wheezing).   Yes [provider]  cetirizine (ZYRTEC) 10 MG tablet Take 1 tablet (10 mg total) by mouth daily. Patient taking differently: Take 10 mg by mouth daily as needed for allergies.  03/10/19  Yes Valentina Shaggy, MD  clobetasol ointment (TEMOVATE) [id number] % Apply 1 application topically 2 (two) times daily as needed (lichen sclerosis). Use for Lichen Sclerosis    Yes Bovard-Stuckert, Jody, MD  Cyanocobalamin (B-12) 1000 MCG SUBL Place 1,000 mcg under the tongue daily. 04/28/18  Yes Brunetta Genera, MD  diazepam (VALIUM) 5 MG tablet Take 1 tablet (5 mg total) by mouth daily as needed for anxiety (panic attacks). 11/30/19  Yes Brunetta Genera, MD  diclofenac sodium (VOLTAREN) 1 % GEL Apply 2 g topically 4 (four) times daily as needed (for back/knee pain.).    Yes [provider]  dicyclomine (BENTYL) 20 MG tablet Take 1 tablet (20 mg total) by mouth 2 (two) times daily. Patient taking differently: Take 20 mg by mouth 3 (three) times daily before meals.  04/16/18  Yes Ocie Cornfield T, PA-C  ergocalciferol (VITAMIN D2) 50000 UNITS capsule Take 50,000 Units by mouth every Monday.    Yes [provider]  lisinopril-hydrochlorothiazide (PRINZIDE,ZESTORETIC) 20-12.5 MG tablet Take 1 tablet by mouth daily.   Yes [provider]  loperamide (IMODIUM A-D) 2 MG tablet Take 1 tablet (2 mg total) by mouth 4 (four) times daily as needed. Take 2 at diarrhea onset , then 1 every 2hr until 12hrs with no BM. May take 2 every 4hrs at night. If diarrhea recurs repeat. 12/28/19  Yes Brunetta Genera, MD  magic mouthwash SOLN Take 5 mLs by mouth 4 (four)  times daily as needed. Patient taking differently: Take 5 mLs by mouth 4 (four) times daily as needed for mouth pain.  09/22/19  Yes Valentina Shaggy, MD  montelukast (SINGULAIR) 10 MG tablet Take 1 tablet (10 mg total) by mouth at bedtime. 03/10/19  Yes Valentina Shaggy, MD  nystatin-triamcinolone [facility]) cream Apply 1 application topically 2 (two) times daily as needed (irritation). Use in skin folds and under breast    Yes Bovard-Stuckert, Jody, MD  Olopatadine HCl (PAZEO) 0.7 % SOLN Apply 1 drop to eye daily. Patient taking differently: Apply 1 drop to eye daily as needed (dry eyes).  03/11/19  Yes Valentina Shaggy, MD  ondansetron (ZOFRAN) 8 MG tablet Take 1 tablet (8  mg total) by mouth every 8 (eight) hours as needed for nausea or vomiting. 07/10/19  Yes Daleen Bo, MD  oxyCODONE (OXY IR/ROXICODONE) 5 MG immediate release tablet Take 1-2 tablets (5-10 mg total) by mouth every 4 (four) hours as needed for moderate pain or severe pain (cancer related pain). 11/16/19  Yes Brunetta Genera, MD  oxyCODONE-acetaminophen (PERCOCET/ROXICET) 5-325 MG tablet Take 1-2 tablets by mouth every 4 (four) hours as needed for moderate pain or severe pain. 07/11/19  Yes Brunetta Genera, MD  pantoprazole (PROTONIX) 40 MG tablet Take 1 tablet (40 mg total) by mouth daily before breakfast. 03/10/19  Yes Valentina Shaggy, MD  pimecrolimus (ELIDEL) 1 % cream Apply 1 application topically 2 (two) times daily. Patient taking differently: Apply 1 application topically 2 (two) times daily as needed (eczema).  07/06/18  Yes Valentina Shaggy, MD  prochlorperazine (COMPAZINE) 10 MG tablet Take 1 tablet (10 mg total) by mouth every 6 (six) hours as needed (NAUSEA). 12/28/19  Yes Brunetta Genera, MD  saccharomyces boulardii (FLORASTOR) 250 MG capsule Take 1 capsule (250 mg total) by mouth 2 (two) times daily. 09/03/18  Yes Georgette Shell, MD  senna-docusate (SENNA S) 8.6-50 MG tablet  Take 2 tablets by mouth at bedtime. Patient taking differently: Take 2 tablets by mouth at bedtime as needed for mild constipation.  10/27/19  Yes Brunetta Genera, MD  TOBRADEX ophthalmic solution Place 1 drop into both eyes every 6 (six) hours as needed (dry eyes).  11/25/19  Yes [provider]  valACYclovir (VALTREX) 500 MG tablet Take 500 mg by mouth 2 (two) times daily as needed (for cold sores).    Yes [provider]  benzonatate (TESSALON) 200 MG capsule Take 1 capsule (200 mg total) by mouth 3 (three) times daily as needed for cough. Patient not taking: Reported on 01/06/2020 09/06/19   Valentina Shaggy, MD  budesonide-formoterol [facility]) 160-4.5 MCG/ACT inhaler Inhale 2 puffs into the lungs 2 (two) times daily. 08/30/19 09/29/19  Valentina Shaggy, MD  capecitabine (XELODA) 500 MG tablet Take 4 tablets (2,000 mg total) by mouth 2 (two) times daily after a meal for 14 days. Take 14 days on, 7 days off, repeat every 21 days. 12/28/19 01/11/20  Brunetta Genera, MD  dexamethasone (DECADRON) 2 MG tablet Take 1 tablet (2 mg total) by mouth 2 (two) times daily with breakfast and lunch. Patient not taking: Reported on 01/06/2020 11/29/19   Brunetta Genera, MD  dronabinol (MARINOL) 5 MG capsule Take 1 capsule (5 mg total) by mouth 2 (two) times daily before a meal. Patient not taking: Reported on 01/06/2020 01/17/19   Brunetta Genera, MD  promethazine (PHENERGAN) 25 MG tablet Take 1 tablet (25 mg total) by mouth every 6 (six) hours as needed for nausea. Patient not taking: Reported on 12/21/2019 06/08/19   Brunetta Genera, MD  triamcinolone (NASACORT) 55 MCG/ACT AERO nasal inhaler Place 2 sprays into the nose daily as needed (for allergies). Patient not taking: Reported on 01/06/2020 03/10/19   Valentina Shaggy, MD    Allergies    Latex, Tape, and Tylenol with codeine #3 [acetaminophen-codeine]  Review of Systems   Review of Systems  Constitutional:  Negative for chills and fever.  Respiratory: Positive for chest tightness and shortness of breath. Negative for cough and wheezing.   Cardiovascular: Positive for palpitations. Negative for chest pain and leg swelling.  Gastrointestinal: Negative for abdominal pain, diarrhea, nausea and vomiting.  Genitourinary: Negative for dysuria.  Allergic/Immunologic: Positive for immunocompromised state.  Neurological: Positive for headaches.  All other systems reviewed and are negative.   Physical Exam Updated Vital Signs BP 130/85   Pulse 99   Temp 98 F (36.7 C) (Oral)   Resp (!) 21   Ht 5\' 8"  (1.727 m)   Wt (!) 171 kg   SpO2 100%   BMI 57.32 kg/m   Physical Exam Vitals and nursing note reviewed.  Constitutional:      General: She is not in acute distress.    Appearance: Normal appearance. She is well-developed. She is obese. She is not ill-appearing.     Comments: Calm, cooperative. NAD  HENT:     Head: Normocephalic and atraumatic.  Eyes:     General: No scleral icterus.       Right eye: No discharge.        Left eye: No discharge.     Conjunctiva/sclera: Conjunctivae normal.     Pupils: Pupils are equal, round, and reactive to light.  Cardiovascular:     Rate and Rhythm: Regular rhythm. Tachycardia present.  Pulmonary:     Effort: Pulmonary effort is normal. No respiratory distress.     Breath sounds: Normal breath sounds.     Comments: Port in LU chest wall Abdominal:     General: There is no distension.     Palpations: Abdomen is soft.     Tenderness: There is no abdominal tenderness.  Musculoskeletal:     Cervical back: Normal range of motion.     Right lower leg: No edema.     Left lower leg: No edema.  Skin:    General: Skin is warm and dry.  Neurological:     Mental Status: She is alert and oriented to person, place, and time.  Psychiatric:        Behavior: Behavior normal.     ED Results / Procedures / Treatments   Labs (all labs ordered are listed, but  only abnormal results are displayed) Labs Reviewed  RESPIRATORY PANEL BY RT PCR (FLU A&B, COVID)  TROPONIN I (HIGH SENSITIVITY)  TROPONIN I (HIGH SENSITIVITY)    EKG EKG Interpretation  Date/Time:  Friday January 06 2020 17:26:37 EDT Ventricular Rate:  96 PR Interval:    QRS Duration: 93 QT Interval:  350 QTC Calculation: 443 R Axis:   57 Text Interpretation: Sinus rhythm Right atrial enlargement No acute changes No significant change since last tracing Confirmed by Varney Biles [id number]) on 01/06/2020 7:44:35 PM   Radiology CT Angio Chest PE W/Cm &/Or Wo Cm  Result Date: 01/06/2020 CLINICAL DATA:  Shortness of breath. History of metastatic colon cancer. EXAM: CT ANGIOGRAPHY CHEST WITH CONTRAST TECHNIQUE: Multidetector CT imaging of the chest was performed using the standard protocol during bolus administration of intravenous contrast. Multiplanar CT image reconstructions and MIPs were obtained to evaluate the vascular anatomy. CONTRAST:  165mL OMNIPAQUE IOHEXOL 350 MG/ML SOLN COMPARISON:  10/27/2019.  09/12/2019. FINDINGS: Cardiovascular: Pulmonary arterial opacification is moderate. No visible pulmonary emboli. Study affectively excludes large central emboli. Small peripheral emboli might be inapparent. No evidence of aortic pathology. Heart size is normal. Mediastinum/Nodes: No mass or adenopathy. Lungs/Pleura: No evidence of pulmonary metastatic disease. There is atelectasis in the right lower lobe which could relate to shortness of breath. Left lung as well aerated. No pleural effusion. Upper Abdomen: Hepatic metastatic disease not optimally evaluated using this technique. Hiatal hernia. Musculoskeletal: Negative Review of the MIP images confirms the  above findings. IMPRESSION: Moderate atelectasis in the right lower lobe could relate 2 shortness of breath. No visible pulmonary emboli. Pulmonary opacification is moderate. The study excludes large and central emboli. Small peripheral emboli  might be inapparent given the degree of vascular opacification. No specific positive evidence for that however. Electronically Signed   By: Nelson Chimes M.D.   On: 01/06/2020 18:43    Procedures Procedures (including critical care time)  Medications Ordered in ED Medications  sodium chloride 0.9 % bolus 500 mL (0 mLs Intravenous Stopped 01/06/20 1906)  iohexol (OMNIPAQUE) 350 MG/ML injection 100 mL (100 mLs Intravenous Contrast Given 01/06/20 1812)  albuterol (VENTOLIN HFA) 108 (90 Base) MCG/ACT inhaler 4 puff (4 puffs Inhalation Given 01/06/20 2045)    ED Course  I have reviewed the triage vital signs and the nursing notes.  Pertinent labs & imaging results that were available during my care of the patient were reviewed by me and considered in my medical decision making (see chart for details).  49 year old female with history of metastatic cancer presents with exertional shortness of breath and lightheadedness which has been ongoing for several weeks but worse today.  On exam she is morbidly obese.  Heart rate is fast and regular.  Blood pressure is minimally elevated.  EKG is sinus tachycardia.  Outside labs from this morning were reviewed and show stable hemoglobin (9.1).  Will obtain CTA of the chest to rule out PE due to cancer history  CTA is negative for large or central emboli.  She has moderate atelectasis in the right lower lobe.  Nursing ambulated the patient in her room she became extremely symptomatic and her sats dropped to the 70s and heart rate was in the 130s. Trop is 8. Shared visit with Dr. Kathrynn Humble. Will admit for acute respiratory failure with hypoxia. Low suspicion for COVID with no evidence of pneumonia on CT and recent COVID infection in November  Discussed with Dr. Myna Hidalgo with [facility] who will admit.  Annakate Briant Sites was evaluated in Emergency Department on 01/06/2020 for the symptoms described in the history of present illness. She was evaluated in the context of the  global COVID-19 pandemic, which necessitated consideration that the patient might be at risk for infection with the SARS-CoV-2 virus that causes COVID-19. Institutional protocols and algorithms that pertain to the evaluation of patients at risk for COVID-19 are in a state of rapid change based on information released by regulatory bodies including the CDC and federal and state organizations. These policies and algorithms were followed during the patient's care in the ED.   MDM Rules/Calculators/A&P                       Final Clinical Impression(s) / ED Diagnoses Final diagnoses:  Shortness of breath  Acute respiratory failure with hypoxia [facility])    Rx / DC Orders ED Discharge Orders    None       Recardo Evangelist, PA-C 01/06/20 [place], Ankit, MD 01/08/20 [phone]

## 2020-01-07 ENCOUNTER — Encounter (HOSPITAL_COMMUNITY): Payer: Self-pay | Admitting: Family Medicine

## 2020-01-07 ENCOUNTER — Encounter: Payer: Self-pay | Admitting: Hematology

## 2020-01-07 DIAGNOSIS — R0602 Shortness of breath: Secondary | ICD-10-CM

## 2020-01-07 DIAGNOSIS — D630 Anemia in neoplastic disease: Secondary | ICD-10-CM | POA: Diagnosis present

## 2020-01-07 DIAGNOSIS — Z91040 Latex allergy status: Secondary | ICD-10-CM | POA: Diagnosis not present

## 2020-01-07 DIAGNOSIS — D849 Immunodeficiency, unspecified: Secondary | ICD-10-CM | POA: Diagnosis present

## 2020-01-07 DIAGNOSIS — Z87891 Personal history of nicotine dependence: Secondary | ICD-10-CM | POA: Diagnosis not present

## 2020-01-07 DIAGNOSIS — J9811 Atelectasis: Secondary | ICD-10-CM | POA: Diagnosis present

## 2020-01-07 DIAGNOSIS — J45909 Unspecified asthma, uncomplicated: Secondary | ICD-10-CM | POA: Diagnosis present

## 2020-01-07 DIAGNOSIS — I110 Hypertensive heart disease with heart failure: Secondary | ICD-10-CM | POA: Diagnosis present

## 2020-01-07 DIAGNOSIS — C189 Malignant neoplasm of colon, unspecified: Secondary | ICD-10-CM | POA: Diagnosis present

## 2020-01-07 DIAGNOSIS — F419 Anxiety disorder, unspecified: Secondary | ICD-10-CM | POA: Diagnosis present

## 2020-01-07 DIAGNOSIS — Z9221 Personal history of antineoplastic chemotherapy: Secondary | ICD-10-CM | POA: Diagnosis not present

## 2020-01-07 DIAGNOSIS — G894 Chronic pain syndrome: Secondary | ICD-10-CM | POA: Diagnosis present

## 2020-01-07 DIAGNOSIS — I5032 Chronic diastolic (congestive) heart failure: Secondary | ICD-10-CM | POA: Diagnosis present

## 2020-01-07 DIAGNOSIS — I10 Essential (primary) hypertension: Secondary | ICD-10-CM | POA: Diagnosis not present

## 2020-01-07 DIAGNOSIS — D649 Anemia, unspecified: Secondary | ICD-10-CM | POA: Diagnosis not present

## 2020-01-07 DIAGNOSIS — I361 Nonrheumatic tricuspid (valve) insufficiency: Secondary | ICD-10-CM | POA: Diagnosis not present

## 2020-01-07 DIAGNOSIS — G4733 Obstructive sleep apnea (adult) (pediatric): Secondary | ICD-10-CM | POA: Insufficient documentation

## 2020-01-07 DIAGNOSIS — Z6841 Body Mass Index (BMI) 40.0 and over, adult: Secondary | ICD-10-CM | POA: Diagnosis not present

## 2020-01-07 DIAGNOSIS — R2689 Other abnormalities of gait and mobility: Secondary | ICD-10-CM | POA: Diagnosis present

## 2020-01-07 DIAGNOSIS — Z20822 Contact with and (suspected) exposure to covid-19: Secondary | ICD-10-CM | POA: Diagnosis present

## 2020-01-07 DIAGNOSIS — Z95828 Presence of other vascular implants and grafts: Secondary | ICD-10-CM | POA: Diagnosis not present

## 2020-01-07 DIAGNOSIS — J9601 Acute respiratory failure with hypoxia: Secondary | ICD-10-CM | POA: Diagnosis present

## 2020-01-07 DIAGNOSIS — Z7951 Long term (current) use of inhaled steroids: Secondary | ICD-10-CM | POA: Diagnosis not present

## 2020-01-07 DIAGNOSIS — Z833 Family history of diabetes mellitus: Secondary | ICD-10-CM | POA: Diagnosis not present

## 2020-01-07 DIAGNOSIS — R5381 Other malaise: Secondary | ICD-10-CM | POA: Diagnosis present

## 2020-01-07 DIAGNOSIS — Z79899 Other long term (current) drug therapy: Secondary | ICD-10-CM | POA: Diagnosis not present

## 2020-01-07 DIAGNOSIS — C787 Secondary malignant neoplasm of liver and intrahepatic bile duct: Secondary | ICD-10-CM | POA: Diagnosis present

## 2020-01-07 DIAGNOSIS — Z90710 Acquired absence of both cervix and uterus: Secondary | ICD-10-CM | POA: Diagnosis not present

## 2020-01-07 DIAGNOSIS — E662 Morbid (severe) obesity with alveolar hypoventilation: Secondary | ICD-10-CM | POA: Diagnosis present

## 2020-01-07 DIAGNOSIS — J4520 Mild intermittent asthma, uncomplicated: Secondary | ICD-10-CM | POA: Diagnosis not present

## 2020-01-07 LAB — BASIC METABOLIC PANEL
Anion gap: 10 (ref 5–15)
BUN: 18 mg/dL (ref 6–20)
CO2: 26 mmol/L (ref 22–32)
Calcium: 8.6 mg/dL — ABNORMAL LOW (ref 8.9–10.3)
Chloride: 107 mmol/L (ref 98–111)
Creatinine, Ser: 1.15 mg/dL — ABNORMAL HIGH (ref 0.44–1.00)
GFR calc Af Amer: >60 mL/min (ref >60)
GFR calc non Af Amer: 55 mL/min — ABNORMAL LOW (ref >60)
Glucose, Bld: 97 mg/dL (ref 70–99)
Potassium: 3.5 mmol/L (ref 3.5–5.1)
Sodium: 143 mmol/L (ref 135–145)

## 2020-01-07 LAB — RESPIRATORY PANEL BY RT PCR (FLU A&B, COVID)
Influenza A by PCR: NEGATIVE
Influenza B by PCR: NEGATIVE
SARS Coronavirus 2 by RT PCR: NEGATIVE

## 2020-01-07 LAB — CBC
HCT: 27.5 % — ABNORMAL LOW (ref 36.0–46.0)
Hemoglobin: 8 g/dL — ABNORMAL LOW (ref 12.0–15.0)
MCH: 27.9 pg (ref 26.0–34.0)
MCHC: 29.1 g/dL — ABNORMAL LOW (ref 30.0–36.0)
MCV: 95.8 fL (ref 80.0–100.0)
Platelets: 232 10*3/uL (ref 150–400)
RBC: 2.87 MIL/uL — ABNORMAL LOW (ref 3.87–5.11)
RDW: 17.8 % — ABNORMAL HIGH (ref 11.5–15.5)
WBC: 10.1 10*3/uL (ref 4.0–10.5)
nRBC: 0 % (ref 0.0–0.2)

## 2020-01-07 LAB — HIV ANTIBODY (ROUTINE TESTING W REFLEX): HIV Screen 4th Generation wRfx: NONREACTIVE

## 2020-01-07 MED ORDER — ONDANSETRON HCL 4 MG/2ML IJ SOLN
4.0000 mg | Freq: Four times a day (QID) | INTRAMUSCULAR | Status: DC | PRN
  Administered 2020-01-07 (×2): 4 mg via INTRAVENOUS
  Filled 2020-01-07 (×2): qty 2

## 2020-01-07 MED ORDER — ACETAMINOPHEN 650 MG RE SUPP: 650.0000 mg | Freq: Four times a day (QID) | RECTAL | Status: DC | PRN

## 2020-01-07 MED ORDER — SENNOSIDES-DOCUSATE SODIUM 8.6-50 MG PO TABS: 1.0000 | ORAL_TABLET | Freq: Every evening | ORAL | Status: DC | PRN

## 2020-01-07 MED ORDER — DICYCLOMINE HCL 20 MG PO TABS
20.0000 mg | ORAL_TABLET | Freq: Three times a day (TID) | ORAL | Status: DC
  Administered 2020-01-07 – 2020-01-10 (×10): 20 mg via ORAL
  Filled 2020-01-07 (×11): qty 1

## 2020-01-07 MED ORDER — BISACODYL 5 MG PO TBEC: 5.0000 mg | DELAYED_RELEASE_TABLET | Freq: Every day | ORAL | Status: DC | PRN

## 2020-01-07 MED ORDER — MONTELUKAST SODIUM 10 MG PO TABS
10.0000 mg | ORAL_TABLET | Freq: Every day | ORAL | Status: DC
  Administered 2020-01-07 – 2020-01-09 (×3): 10 mg via ORAL
  Filled 2020-01-07 (×3): qty 1

## 2020-01-07 MED ORDER — PANTOPRAZOLE SODIUM 40 MG PO TBEC
40.0000 mg | DELAYED_RELEASE_TABLET | Freq: Every day | ORAL | Status: DC
  Administered 2020-01-07 – 2020-01-10 (×4): 40 mg via ORAL
  Filled 2020-01-07 (×4): qty 1

## 2020-01-07 MED ORDER — SODIUM CHLORIDE 0.9 % IV SOLN: INTRAVENOUS | Status: DC

## 2020-01-07 MED ORDER — CHLORHEXIDINE GLUCONATE CLOTH 2 % EX PADS
6.0000 | MEDICATED_PAD | Freq: Every day | CUTANEOUS | Status: DC
  Administered 2020-01-07 – 2020-01-10 (×4): 6 via TOPICAL

## 2020-01-07 MED ORDER — LORAZEPAM 1 MG PO TABS
1.0000 mg | ORAL_TABLET | Freq: Four times a day (QID) | ORAL | Status: DC | PRN
  Administered 2020-01-07: 1 mg via ORAL
  Filled 2020-01-07: qty 1

## 2020-01-07 MED ORDER — ALBUTEROL SULFATE HFA 108 (90 BASE) MCG/ACT IN AERS: 2.0000 | INHALATION_SPRAY | RESPIRATORY_TRACT | Status: DC | PRN

## 2020-01-07 MED ORDER — OXYCODONE HCL 5 MG PO TABS
5.0000 mg | ORAL_TABLET | ORAL | Status: DC | PRN
  Filled 2020-01-07: qty 1

## 2020-01-07 MED ORDER — ALBUTEROL SULFATE (2.5 MG/3ML) 0.083% IN NEBU: 2.5000 mg | INHALATION_SOLUTION | Freq: Four times a day (QID) | RESPIRATORY_TRACT | Status: DC | PRN

## 2020-01-07 MED ORDER — MOMETASONE FURO-FORMOTEROL FUM 200-5 MCG/ACT IN AERO
2.0000 | INHALATION_SPRAY | Freq: Two times a day (BID) | RESPIRATORY_TRACT | Status: DC
  Administered 2020-01-07 – 2020-01-09 (×5): 2 via RESPIRATORY_TRACT
  Filled 2020-01-07: qty 8.8

## 2020-01-07 MED ORDER — ENOXAPARIN SODIUM 80 MG/0.8ML ~~LOC~~ SOLN
80.0000 mg | SUBCUTANEOUS | Status: DC
  Administered 2020-01-07 – 2020-01-10 (×3): 80 mg via SUBCUTANEOUS
  Filled 2020-01-07 (×4): qty 0.8

## 2020-01-07 MED ORDER — TRAMADOL HCL 50 MG PO TABS
50.0000 mg | ORAL_TABLET | Freq: Four times a day (QID) | ORAL | Status: DC | PRN
  Administered 2020-01-07 – 2020-01-09 (×3): 50 mg via ORAL
  Filled 2020-01-07 (×3): qty 1

## 2020-01-07 MED ORDER — DIAZEPAM 5 MG PO TABS: 5.0000 mg | ORAL_TABLET | Freq: Every day | ORAL | Status: DC | PRN

## 2020-01-07 MED ORDER — ONDANSETRON HCL 4 MG PO TABS: 4.0000 mg | ORAL_TABLET | Freq: Four times a day (QID) | ORAL | Status: DC | PRN

## 2020-01-07 MED ORDER — ACETAMINOPHEN 325 MG PO TABS: 650.0000 mg | ORAL_TABLET | Freq: Four times a day (QID) | ORAL | Status: DC | PRN

## 2020-01-07 NOTE — ED Notes (Signed)
Pt lying in bed. Water given. Full monitor on. Will continue to monitor.

## 2020-01-07 NOTE — Progress Notes (Signed)
[facility]                                                                              Patient Demographics  Margaret [name], is a 49 y.o. female, DOB - [birth year], [id number]  Admit date - 01/06/2020   Admitting Physician Vianne Bulls, MD  Outpatient Primary MD for the patient is Nolene Ebbs, MD  Outpatient specialists:   LOS - 0  days   Medical records reviewed and are as summarized below:    Chief Complaint  Patient presents with  . Shortness of Breath       Brief summary   Patient is a 49 year old female with history of colon CA with liver metastasis, anxiety, hypertension, chronic pain, OSA on CPAP presented with shortness of breath, fatigue, 3 to 4 days of poor oral intake, dyspnea.  Patient reported similar symptoms previously attributed to symptomatic anemia, no melena or hematochezia.  She has a history of asthma but no recent coughing or wheezing. In ED, afebrile, O2 sats 70s with ambulation in ED, tachycardiac up to 120, BP in 90s.  CT angiogram chest was negative for PE but moderate atelectasis in the right lower lobe creatinine 1.1.  Patient received normal saline fluid bolus and was placed on IV fluids COVID-19 test, influenza panel negative  Assessment & Plan    Principal Problem:   Acute respiratory failure with hypoxia (HCC) -Unclear etiology, reported 3 to 4 days history of dyspnea with hypoxia, O2 sat in 70s with mild exertion.  Has underlying history of asthma, OSA, likely OHV -CTA chest negative for pulmonary embolism but notable for atelectasis in the right lower lobe -Patient on chemotherapy, will obtain 2D echocardiogram to rule out cardiomyopathy/CHF -For now continue incentive spirometry, inhalers, mobilize, O2 -Home O2 evaluation prior to discharge  Active problems Asthma: -Currently no coughing or wheezing -Continue ICS/LABA, Singulair, as needed albuterol  History of colon CA with metastasis -Currently  undergoing chemotherapy, was seen at [facility] on 4/23 and was sent to ED due to hypoxia and tachycardia. -Continue oncology follow-up after hospitalization   Hypertension -BP soft in ED currently stable, continue to hold antihypertensives  Anxiety Continue as needed Valium  Chronic pain syndrome Currently no acute pain issues, continue home regimen  OSA Continue CPAP  Morbid obesity Estimated body mass index is 54.64 kg/m as calculated from the following:   Height as of this encounter: 5\' 8"  (1.727 m).   Weight as of this encounter: 163 kg.   Generalized debility Per patient, fall risk, will have PT evaluation tomorrow  Code Status: Full CODE STATUS DVT Prophylaxis:  Lovenox Family Communication: Discussed all imaging results, lab results, explained to the patient   Disposition Plan:     Status is: Observation  The patient remains OBS appropriate and will d/c before 2 midnights.,  Still short of breath, work-up in progress  Dispo: The patient is from: Home              Anticipated d/c is to: Home              Anticipated d/c date is: 2 days  Patient currently is not medically stable to d/c.      Time Spent in minutes 25 minutes  Procedures:  None  Consultants:   None  Antimicrobials:   Anti-infectives (From admission, onward)   None          Medications  Scheduled Meds: . Chlorhexidine Gluconate Cloth  6 each Topical Daily  . dicyclomine  20 mg Oral TID AC  . enoxaparin (LOVENOX) injection  80 mg Subcutaneous Q24H  . mometasone-formoterol  2 puff Inhalation BID  . montelukast  10 mg Oral QHS  . pantoprazole  40 mg Oral QAC breakfast   Continuous Infusions: PRN Meds:.acetaminophen **OR** acetaminophen, albuterol, bisacodyl, diazepam, ondansetron **OR** ondansetron (ZOFRAN) IV, oxyCODONE, senna-docusate      Subjective:   Margaret [name] was seen and examined today.  No acute chest pain, still feels weak and short of  breath, states she is fall risk.  No chest pain. Patient denies abdominal pain, N/V/D/C, new weakness, numbess, tingling. No acute events overnight.    Objective:   Vitals:   01/07/20 0307 01/07/20 0313 01/07/20 0515 01/07/20 0844  BP:  120/79 138/76   Pulse:  (!) 106 99   Resp:  18 (!) 24   Temp:   97.6 F (36.4 C)   TempSrc:  Oral Oral   SpO2:  100% 100% 99%  Weight: (!) 163 kg     Height: 5\' 8"  (1.727 m)       Intake/Output Summary (Last 24 hours) at 01/07/2020 1251 Last data filed at 01/07/2020 1236 Gross per 24 hour  Intake 1701.69 ml  Output 700 ml  Net 1001.69 ml     Wt Readings from Last 3 Encounters:  01/07/20 (!) 163 kg  12/21/19 (!) 171.1 kg  12/07/19 (!) 171.2 kg     Exam  General: Alert and oriented x 3, NAD  Cardiovascular: S1 S2 auscultated, no murmurs, RRR  Respiratory: Decreased breath sound at the bases  Gastrointestinal: Soft, nontender, nondistended, + bowel sounds  Ext: no pedal edema bilaterally  Neuro: No new deficit  Musculoskeletal: No digital cyanosis, clubbing  Skin: No rashes  Psych: Normal affect and demeanor, alert and oriented x3    Data Reviewed:  I have personally reviewed following labs and imaging studies  Micro Results Recent Results (from the past 240 hour(s))  Respiratory Panel by RT PCR (Flu A&B, Covid) - Nasopharyngeal Swab     Status: None   Collection Time: 01/06/20  9:35 PM   Specimen: Nasopharyngeal Swab  Result Value Ref Range Status   SARS Coronavirus 2 by RT PCR NEGATIVE NEGATIVE Final    Comment: (NOTE) SARS-CoV-2 target nucleic acids are NOT DETECTED. The SARS-CoV-2 RNA is generally detectable in upper respiratoy specimens during the acute phase of infection. The lowest concentration of SARS-CoV-2 viral copies this assay can detect is 131 copies/mL. A negative result does not preclude SARS-Cov-2 infection and should not be used as the sole basis for treatment or other patient management decisions. A  negative result may occur with  improper specimen collection/handling, submission of specimen other than nasopharyngeal swab, presence of viral mutation(s) within the areas targeted by this assay, and inadequate number of viral copies (<131 copies/mL). A negative result must be combined with clinical observations, patient history, and epidemiological information. The expected result is Negative. Fact Sheet for Patients:  PinkCheek.be Fact Sheet for Healthcare Providers:  GravelBags.it This test is not yet ap proved or cleared by the [place] FDA and  has been  authorized for detection and/or diagnosis of SARS-CoV-2 by FDA under an Emergency Use Authorization (EUA). This EUA will remain  in effect (meaning this test can be used) for the duration of the COVID-19 declaration under Section 564(b)(1) of the Act, 21 U.S.C. section 360bbb-3(b)(1), unless the authorization is terminated or revoked sooner.    Influenza A by PCR NEGATIVE NEGATIVE Final   Influenza B by PCR NEGATIVE NEGATIVE Final    Comment: (NOTE) The Xpert Xpress SARS-CoV-2/FLU/RSV assay is intended as an aid in  the diagnosis of influenza from Nasopharyngeal swab specimens and  should not be used as a sole basis for treatment. Nasal washings and  aspirates are unacceptable for Xpert Xpress SARS-CoV-2/FLU/RSV  testing. Fact Sheet for Patients: PinkCheek.be Fact Sheet for Healthcare Providers: GravelBags.it This test is not yet approved or cleared by the [place] FDA and  has been authorized for detection and/or diagnosis of SARS-CoV-2 by  FDA under an Emergency Use Authorization (EUA). This EUA will remain  in effect (meaning this test can be used) for the duration of the  Covid-19 declaration under Section 564(b)(1) of the Act, 21  U.S.C. section 360bbb-3(b)(1), unless the authorization is    terminated or revoked. Performed at [facility], [place] [address] [place], [place] [address]     Radiology Reports CT Angio Chest PE W/Cm &/Or Wo Cm  Result Date: 01/06/2020 CLINICAL DATA:  Shortness of breath. History of metastatic colon cancer. EXAM: CT ANGIOGRAPHY CHEST WITH CONTRAST TECHNIQUE: Multidetector CT imaging of the chest was performed using the standard protocol during bolus administration of intravenous contrast. Multiplanar CT image reconstructions and MIPs were obtained to evaluate the vascular anatomy. CONTRAST:  111mL OMNIPAQUE IOHEXOL 350 MG/ML SOLN COMPARISON:  10/27/2019.  09/12/2019. FINDINGS: Cardiovascular: Pulmonary arterial opacification is moderate. No visible pulmonary emboli. Study affectively excludes large central emboli. Small peripheral emboli might be inapparent. No evidence of aortic pathology. Heart size is normal. Mediastinum/Nodes: No mass or adenopathy. Lungs/Pleura: No evidence of pulmonary metastatic disease. There is atelectasis in the right lower lobe which could relate to shortness of breath. Left lung as well aerated. No pleural effusion. Upper Abdomen: Hepatic metastatic disease not optimally evaluated using this technique. Hiatal hernia. Musculoskeletal: Negative Review of the MIP images confirms the above findings. IMPRESSION: Moderate atelectasis in the right lower lobe could relate 2 shortness of breath. No visible pulmonary emboli. Pulmonary opacification is moderate. The study excludes large and central emboli. Small peripheral emboli might be inapparent given the degree of vascular opacification. No specific positive evidence for that however. Electronically Signed   By: Nelson Chimes M.D.   On: 01/06/2020 18:43    Lab Data:  CBC: Recent Labs  Lab 01/06/20 1127 01/07/20 0600  WBC 10.3 10.1  NEUTROABS 6.5  --   HGB 9.1* 8.0*  HCT 31.8* 27.5*  MCV 94.1 95.8  PLT 278 [id number]   Basic Metabolic Panel: Recent Labs  Lab  01/06/20 1127 01/07/20 0600  NA 147* 143  K 3.7 3.5  CL 110 107  CO2 26 26  GLUCOSE 105* 97  BUN 14 18  CREATININE 1.13* 1.15*  CALCIUM 9.4 8.6*   GFR: Estimated Creatinine Clearance: 95.6 mL/min (A) (by C-G formula based on SCr of 1.15 mg/dL (H)). Liver Function Tests: Recent Labs  Lab 01/06/20 1127  AST 27  ALT 13  ALKPHOS 111  BILITOT 0.5  PROT 7.6  ALBUMIN 2.4*   No results for input(s): LIPASE, AMYLASE in the last 168 hours.  No results for input(s): AMMONIA in the last 168 hours. Coagulation Profile: No results for input(s): INR, PROTIME in the last 168 hours. Cardiac Enzymes: No results for input(s): CKTOTAL, CKMB, CKMBINDEX, TROPONINI in the last 168 hours. BNP (last 3 results) No results for input(s): PROBNP in the last 8760 hours. HbA1C: No results for input(s): HGBA1C in the last 72 hours. CBG: No results for input(s): GLUCAP in the last 168 hours. Lipid Profile: No results for input(s): CHOL, HDL, LDLCALC, TRIG, CHOLHDL, LDLDIRECT in the last 72 hours. Thyroid Function Tests: No results for input(s): TSH, T4TOTAL, FREET4, T3FREE, THYROIDAB in the last 72 hours. Anemia Panel: No results for input(s): VITAMINB12, FOLATE, FERRITIN, TIBC, IRON, RETICCTPCT in the last 72 hours. Urine analysis:    Component Value Date/Time   COLORURINE YELLOW 07/10/2019 1202   APPEARANCEUR CLEAR 07/10/2019 1202   LABSPEC 1.018 07/10/2019 1202   PHURINE 5.0 07/10/2019 1202   GLUCOSEU NEGATIVE 07/10/2019 1202   HGBUR NEGATIVE 07/10/2019 1202   BILIRUBINUR NEGATIVE 07/10/2019 [place] 07/10/2019 1202   PROTEINUR NEGATIVE 12/07/2019 1412   UROBILINOGEN 0.2 01/06/2011 1755   NITRITE NEGATIVE 07/10/2019 [place] 07/10/2019 [place] M.D. [facility] 01/07/2020, 12:51 PM   Call night coverage person covering after 7pm

## 2020-01-07 NOTE — H&P (Signed)
History and Physical    Margaret [name] [id number] DOB: 08/23/1971 DOA: 01/06/2020  PCP: Nolene Ebbs, MD   Patient coming from: Home   Chief Complaint: SOB, fatigue   HPI: Margaret [name] is a 49 y.o. female with medical history significant for colon cancer with liver metastases, anxiety, hypertension, chronic pain, and OSA on CPAP, presenting to the emergency department with shortness of breath and fatigue.  Patient reports 3 or 4 days of poor oral intake, fatigue, and dyspnea.  She denies any fevers or chills, has not been coughing much, denies any leg swelling or orthopnea, and denies chest pain.  She reports similar symptoms previously that were attributed to symptomatic anemia.  She denies any melena or hematochezia.  She has history of asthma but denies any recent cough or wheezing and has been using her inhalers at home, but with no relief with the current symptoms.  ED Course: Upon arrival to the ED, patient is found to be afebrile, saturating in the 70s with ambulation in the ED, tachycardic up to [id number], and with systolic blood pressure in the 90s.  EKG features a sinus rhythm.  CTA chest is negative for PE but notable for moderate atelectasis in the right lower lobe.  Chemistry panel with creatinine 1.13, slightly improved from recent prior.  CBC with stable chronic normocytic anemia.  High-sensitivity troponin is normal x2.  The patient was treated with 500 cc normal saline and albuterol in the ED.  Covid and influenza PCR are pending.  Review of Systems:  All other systems reviewed and apart from HPI, are negative.  Past Medical History:  Diagnosis Date  . Allergic rhinitis   . Anxiety diagnosed in 1990  . Arthritis   . Asthma   . colon ca with liver mets dx'd 08/2018  . Dyspnea    with low blood count hx of anemia  . Eczema   . History of blood transfusion   . Hypertension   . Lower back pain   . Migraine   . Sleep apnea    uses CPAP    Past Surgical  History:  Procedure Laterality Date  . ABDOMINAL HYSTERECTOMY    . cortisone injections     knees bilat and back   . IR IMAGING GUIDED PORT INSERTION  09/14/2018  . iron infusion    . LIVER BIOPSY       reports that she quit smoking about 20 years ago. She has never used smokeless tobacco. She reports that she does not drink alcohol or use drugs.  Allergies  Allergen Reactions  . Latex Hives and Shortness Of Breath  . Tape Rash  . Tylenol With Codeine #3 [Acetaminophen-Codeine] Itching and Rash    Family History  Problem Relation Age of Onset  . Diabetes Father      Prior to Admission medications   Medication Sig Start Date End Date Taking? Authorizing Provider  albuterol (PROAIR HFA) 108 (90 Base) MCG/ACT inhaler Inhale 2 puffs into the lungs every 4 (four) hours as needed for wheezing or shortness of breath. 08/30/19  Yes Valentina Shaggy, MD  ascorbic acid (VITAMIN C) 500 MG tablet Take 500 mg by mouth daily. 09/22/19  Yes [provider]  Boric Acid 4 % SOLN Place 1 capsule vaginally every Friday.    Yes [provider]  budesonide-formoterol (SYMBICORT) 160-4.5 MCG/ACT inhaler Inhale 2 puffs into the lungs 2 (two) times daily as needed (sob/wheezing).   Yes [provider]  cetirizine (ZYRTEC)  10 MG tablet Take 1 tablet (10 mg total) by mouth daily. Patient taking differently: Take 10 mg by mouth daily as needed for allergies.  03/10/19  Yes Valentina Shaggy, MD  clobetasol ointment (TEMOVATE) [id number] % Apply 1 application topically 2 (two) times daily as needed (lichen sclerosis). Use for Lichen Sclerosis    Yes Bovard-Stuckert, Jody, MD  Cyanocobalamin (B-12) 1000 MCG SUBL Place 1,000 mcg under the tongue daily. 04/28/18  Yes Brunetta Genera, MD  diazepam (VALIUM) 5 MG tablet Take 1 tablet (5 mg total) by mouth daily as needed for anxiety (panic attacks). 11/30/19  Yes Brunetta Genera, MD  diclofenac sodium (VOLTAREN) 1 % GEL Apply 2 g  topically 4 (four) times daily as needed (for back/knee pain.).    Yes [provider]  dicyclomine (BENTYL) 20 MG tablet Take 1 tablet (20 mg total) by mouth 2 (two) times daily. Patient taking differently: Take 20 mg by mouth 3 (three) times daily before meals.  04/16/18  Yes Ocie Cornfield T, PA-C  ergocalciferol (VITAMIN D2) 50000 UNITS capsule Take 50,000 Units by mouth every Monday.    Yes [provider]  lisinopril-hydrochlorothiazide (PRINZIDE,ZESTORETIC) 20-12.5 MG tablet Take 1 tablet by mouth daily.   Yes [provider]  loperamide (IMODIUM A-D) 2 MG tablet Take 1 tablet (2 mg total) by mouth 4 (four) times daily as needed. Take 2 at diarrhea onset , then 1 every 2hr until 12hrs with no BM. May take 2 every 4hrs at night. If diarrhea recurs repeat. 12/28/19  Yes Brunetta Genera, MD  magic mouthwash SOLN Take 5 mLs by mouth 4 (four) times daily as needed. Patient taking differently: Take 5 mLs by mouth 4 (four) times daily as needed for mouth pain.  09/22/19  Yes Valentina Shaggy, MD  montelukast (SINGULAIR) 10 MG tablet Take 1 tablet (10 mg total) by mouth at bedtime. 03/10/19  Yes Valentina Shaggy, MD  nystatin-triamcinolone [facility] II) cream Apply 1 application topically 2 (two) times daily as needed (irritation). Use in skin folds and under breast    Yes Bovard-Stuckert, Jody, MD  Olopatadine HCl (PAZEO) 0.7 % SOLN Apply 1 drop to eye daily. Patient taking differently: Apply 1 drop to eye daily as needed (dry eyes).  03/11/19  Yes Valentina Shaggy, MD  ondansetron (ZOFRAN) 8 MG tablet Take 1 tablet (8 mg total) by mouth every 8 (eight) hours as needed for nausea or vomiting. 07/10/19  Yes Daleen Bo, MD  oxyCODONE (OXY IR/ROXICODONE) 5 MG immediate release tablet Take 1-2 tablets (5-10 mg total) by mouth every 4 (four) hours as needed for moderate pain or severe pain (cancer related pain). 11/16/19  Yes Brunetta Genera, MD    oxyCODONE-acetaminophen (PERCOCET/ROXICET) 5-325 MG tablet Take 1-2 tablets by mouth every 4 (four) hours as needed for moderate pain or severe pain. 07/11/19  Yes Brunetta Genera, MD  pantoprazole (PROTONIX) 40 MG tablet Take 1 tablet (40 mg total) by mouth daily before breakfast. 03/10/19  Yes Valentina Shaggy, MD  pimecrolimus (ELIDEL) 1 % cream Apply 1 application topically 2 (two) times daily. Patient taking differently: Apply 1 application topically 2 (two) times daily as needed (eczema).  07/06/18  Yes Valentina Shaggy, MD  prochlorperazine (COMPAZINE) 10 MG tablet Take 1 tablet (10 mg total) by mouth every 6 (six) hours as needed (NAUSEA). 12/28/19  Yes Brunetta Genera, MD  saccharomyces boulardii (FLORASTOR) 250 MG capsule Take 1 capsule (250 mg total)  by mouth 2 (two) times daily. 09/03/18  Yes Georgette Shell, MD  senna-docusate (SENNA S) 8.6-50 MG tablet Take 2 tablets by mouth at bedtime. Patient taking differently: Take 2 tablets by mouth at bedtime as needed for mild constipation.  10/27/19  Yes Brunetta Genera, MD  TOBRADEX ophthalmic solution Place 1 drop into both eyes every 6 (six) hours as needed (dry eyes).  11/25/19  Yes [provider]  valACYclovir (VALTREX) 500 MG tablet Take 500 mg by mouth 2 (two) times daily as needed (for cold sores).    Yes [provider]  benzonatate (TESSALON) 200 MG capsule Take 1 capsule (200 mg total) by mouth 3 (three) times daily as needed for cough. Patient not taking: Reported on 01/06/2020 09/06/19   Valentina Shaggy, MD  budesonide-formoterol [facility]) 160-4.5 MCG/ACT inhaler Inhale 2 puffs into the lungs 2 (two) times daily. 08/30/19 09/29/19  Valentina Shaggy, MD  capecitabine (XELODA) 500 MG tablet Take 4 tablets (2,000 mg total) by mouth 2 (two) times daily after a meal for 14 days. Take 14 days on, 7 days off, repeat every 21 days. 12/28/19 01/11/20  Brunetta Genera, MD   dexamethasone (DECADRON) 2 MG tablet Take 1 tablet (2 mg total) by mouth 2 (two) times daily with breakfast and lunch. Patient not taking: Reported on 01/06/2020 11/29/19   Brunetta Genera, MD  dronabinol (MARINOL) 5 MG capsule Take 1 capsule (5 mg total) by mouth 2 (two) times daily before a meal. Patient not taking: Reported on 01/06/2020 01/17/19   Brunetta Genera, MD  promethazine (PHENERGAN) 25 MG tablet Take 1 tablet (25 mg total) by mouth every 6 (six) hours as needed for nausea. Patient not taking: Reported on 12/21/2019 06/08/19   Brunetta Genera, MD  triamcinolone (NASACORT) 55 MCG/ACT AERO nasal inhaler Place 2 sprays into the nose daily as needed (for allergies). Patient not taking: Reported on 01/06/2020 03/10/19   Valentina Shaggy, MD    Physical Exam: Vitals:   01/06/20 2045 01/06/20 2130 01/06/20 2150 01/06/20 2200  BP: 92/70  125/88 (!) 131/105  Pulse: (!) 112 (!) 108 (!) 111 (!) 113  Resp: (!) 21 (!) 26 (!) 24 (!) 21  Temp:      TempSrc:      SpO2: 99% 97% 99% 100%  Weight:      Height:        Constitutional: NAD, calm  Eyes: PERTLA, lids and conjunctivae normal ENMT: Mucous membranes are moist. Posterior pharynx clear of any exudate or lesions.   Neck: normal, supple, no masses, no thyromegaly Respiratory: no wheezing, no crackles. No accessory muscle use.  Cardiovascular: S1 & S2 heard, regular rate and rhythm. No extremity edema.   Abdomen: No distension, no tenderness, soft. Bowel sounds active.  Musculoskeletal: no clubbing / cyanosis. No joint deformity upper and lower extremities.   Skin: no significant rashes, lesions, ulcers. Warm, dry, well-perfused. Neurologic: No facial asymmetry. Sensation intact. Moving all extremities.  Psychiatric: Alert and oriented to person, place, and situation. Very pleasant and cooperative.    Labs and Imaging on Admission: I have personally reviewed following labs and imaging studies  CBC: Recent Labs  Lab  01/06/20 1127  WBC 10.3  NEUTROABS 6.5  HGB 9.1*  HCT 31.8*  MCV 94.1  PLT 0000000   Basic Metabolic Panel: Recent Labs  Lab 01/06/20 1127  NA 147*  K 3.7  CL 110  CO2 26  GLUCOSE 105*  BUN 14  CREATININE 1.13*  CALCIUM 9.4   GFR: Estimated Creatinine Clearance: 100.3 mL/min (A) (by C-G formula based on SCr of 1.13 mg/dL (H)). Liver Function Tests: Recent Labs  Lab 01/06/20 1127  AST 27  ALT 13  ALKPHOS 111  BILITOT 0.5  PROT 7.6  ALBUMIN 2.4*   No results for input(s): LIPASE, AMYLASE in the last 168 hours. No results for input(s): AMMONIA in the last 168 hours. Coagulation Profile: No results for input(s): INR, PROTIME in the last 168 hours. Cardiac Enzymes: No results for input(s): CKTOTAL, CKMB, CKMBINDEX, TROPONINI in the last 168 hours. BNP (last 3 results) No results for input(s): PROBNP in the last 8760 hours. HbA1C: No results for input(s): HGBA1C in the last 72 hours. CBG: No results for input(s): GLUCAP in the last 168 hours. Lipid Profile: No results for input(s): CHOL, HDL, LDLCALC, TRIG, CHOLHDL, LDLDIRECT in the last 72 hours. Thyroid Function Tests: No results for input(s): TSH, T4TOTAL, FREET4, T3FREE, THYROIDAB in the last 72 hours. Anemia Panel: No results for input(s): VITAMINB12, FOLATE, FERRITIN, TIBC, IRON, RETICCTPCT in the last 72 hours. Urine analysis:    Component Value Date/Time   COLORURINE YELLOW 07/10/2019 1202   APPEARANCEUR CLEAR 07/10/2019 1202   LABSPEC 1.018 07/10/2019 1202   PHURINE 5.0 07/10/2019 1202   GLUCOSEU NEGATIVE 07/10/2019 1202   HGBUR NEGATIVE 07/10/2019 1202   BILIRUBINUR NEGATIVE 07/10/2019 1202   KETONESUR NEGATIVE 07/10/2019 1202   PROTEINUR NEGATIVE 12/07/2019 1412   UROBILINOGEN 0.2 01/06/2011 1755   NITRITE NEGATIVE 07/10/2019 1202   LEUKOCYTESUR NEGATIVE 07/10/2019 1202   Sepsis Labs: @LABRCNTIP (procalcitonin:4,lacticidven:4) )No results found for this or any previous visit (from the past 240  hour(s)).   Radiological Exams on Admission: CT Angio Chest PE W/Cm &/Or Wo Cm  Result Date: 01/06/2020 CLINICAL DATA:  Shortness of breath. History of metastatic colon cancer. EXAM: CT ANGIOGRAPHY CHEST WITH CONTRAST TECHNIQUE: Multidetector CT imaging of the chest was performed using the standard protocol during bolus administration of intravenous contrast. Multiplanar CT image reconstructions and MIPs were obtained to evaluate the vascular anatomy. CONTRAST:  187mL OMNIPAQUE IOHEXOL 350 MG/ML SOLN COMPARISON:  10/27/2019.  09/12/2019. FINDINGS: Cardiovascular: Pulmonary arterial opacification is moderate. No visible pulmonary emboli. Study affectively excludes large central emboli. Small peripheral emboli might be inapparent. No evidence of aortic pathology. Heart size is normal. Mediastinum/Nodes: No mass or adenopathy. Lungs/Pleura: No evidence of pulmonary metastatic disease. There is atelectasis in the right lower lobe which could relate to shortness of breath. Left lung as well aerated. No pleural effusion. Upper Abdomen: Hepatic metastatic disease not optimally evaluated using this technique. Hiatal hernia. Musculoskeletal: Negative Review of the MIP images confirms the above findings. IMPRESSION: Moderate atelectasis in the right lower lobe could relate 2 shortness of breath. No visible pulmonary emboli. Pulmonary opacification is moderate. The study excludes large and central emboli. Small peripheral emboli might be inapparent given the degree of vascular opacification. No specific positive evidence for that however. Electronically Signed   By: Nelson Chimes M.D.   On: 01/06/2020 18:43    EKG: Independently reviewed. Sinus rhythm.   Assessment/Plan   1. Acute hypoxic respiratory failure  - Presents with 3-4 days of SOB with oxygen saturation dipping into 70s with mild exertion, was tachycardic to 120s, not febrile or coughing, and sxs not improved with her inhalers at home  - CTA chest  negative for PE but notable for atelectasis  - Start incentive spirometry q1h, continue inhalers, mobilize, use supplemental O2 as needed  2. Asthma  - No cough or wheezing in ED and patient reports current symptoms not relieved by inhalers at home  - Continue ICS/LABA, Singulair, and prn albuterol    3. Colon cancer with metastases  - She is undergoing chemotherapy and was seen at [facility] today but sent to ED due to SOB and tachycardia at rest  - She will continue oncology follow-up after hospital d/c   4. Hypertension  - SBP low 90s in ED and antihypertensives held initially    5. Anxiety  - Continue as-needed Valium    6. Chronic pain  - No pain complaints on admission, will continue home regimen   7.  OSA  - Continue CPAP nightly   DVT prophylaxis: Lovenox  Code Status: Full  Family Communication: Discussed with patient  Disposition Plan:  Patient is from: Home  Anticipated d/c is to: Home  Anticipated d/c date is: 01/08/20 Patient currently: Requiring eval and management for new supplemental oxygen requirement Consults called: None  Admission status: Observation     Vianne Bulls, MD [facility] Pager: See [email or web address]  If 7AM-7PM, please contact the daytime attending [email or web address]  01/07/2020, 12:37 AM

## 2020-01-08 ENCOUNTER — Inpatient Hospital Stay (HOSPITAL_COMMUNITY): Payer: Medicaid Other

## 2020-01-08 DIAGNOSIS — I361 Nonrheumatic tricuspid (valve) insufficiency: Secondary | ICD-10-CM

## 2020-01-08 LAB — CBC
HCT: 26.5 % — ABNORMAL LOW (ref 36.0–46.0)
Hemoglobin: 7.7 g/dL — ABNORMAL LOW (ref 12.0–15.0)
MCH: 28 pg (ref 26.0–34.0)
MCHC: 29.1 g/dL — ABNORMAL LOW (ref 30.0–36.0)
MCV: 96.4 fL (ref 80.0–100.0)
Platelets: 222 10*3/uL (ref 150–400)
RBC: 2.75 MIL/uL — ABNORMAL LOW (ref 3.87–5.11)
RDW: 17.3 % — ABNORMAL HIGH (ref 11.5–15.5)
WBC: 8.6 10*3/uL (ref 4.0–10.5)
nRBC: 0 % (ref 0.0–0.2)

## 2020-01-08 LAB — BASIC METABOLIC PANEL
Anion gap: 7 (ref 5–15)
BUN: 19 mg/dL (ref 6–20)
CO2: 26 mmol/L (ref 22–32)
Calcium: 8.2 mg/dL — ABNORMAL LOW (ref 8.9–10.3)
Chloride: 109 mmol/L (ref 98–111)
Creatinine, Ser: 1.14 mg/dL — ABNORMAL HIGH (ref 0.44–1.00)
GFR calc Af Amer: >60 mL/min (ref >60)
GFR calc non Af Amer: 56 mL/min — ABNORMAL LOW (ref >60)
Glucose, Bld: 97 mg/dL (ref 70–99)
Potassium: 3.9 mmol/L (ref 3.5–5.1)
Sodium: 142 mmol/L (ref 135–145)

## 2020-01-08 LAB — ECHOCARDIOGRAM COMPLETE
Height: 68 in
Weight: 5749.6 oz

## 2020-01-08 LAB — PREPARE RBC (CROSSMATCH)

## 2020-01-08 MED ORDER — SODIUM CHLORIDE 0.9% IV SOLUTION
Freq: Once | INTRAVENOUS | Status: AC
  Administered 2020-01-08: 250 mL via INTRAVENOUS

## 2020-01-08 NOTE — Progress Notes (Addendum)
[facility]                                                                              Patient Demographics  Margaret [name], is a 49 y.o. female, DOB - 12/31/1970, [id number]  Admit date - 01/06/2020   Admitting Physician Vianne Bulls, MD  Outpatient Primary MD for the patient is Nolene Ebbs, MD  Outpatient specialists:   LOS - 1  days   Medical records reviewed and are as summarized below:    Chief Complaint  Patient presents with  . Shortness of Breath       Brief summary   Patient is a 49 year old female with history of colon CA with liver metastasis, anxiety, hypertension, chronic pain, OSA on CPAP presented with shortness of breath, fatigue, 3 to 4 days of poor oral intake, dyspnea.  Patient reported similar symptoms previously attributed to symptomatic anemia, no melena or hematochezia.  She has a history of asthma but no recent coughing or wheezing. In ED, afebrile, O2 sats 70s with ambulation in ED, tachycardiac up to 120, BP in 90s.  CT angiogram chest was negative for PE but moderate atelectasis in the right lower lobe creatinine 1.1.  Patient received normal saline fluid bolus and was placed on IV fluids COVID-19 test, influenza panel negative  Assessment & Plan    Principal Problem:   Acute respiratory failure with hypoxia (HCC) -Unclear etiology, reported 3 to 4 days history of dyspnea with hypoxia, O2 sat in 70s with mild exertion.  Has underlying history of asthma, OSA, likely OHV -CTA chest negative for pulmonary embolism but notable for atelectasis in the right lower lobe -Patient on chemotherapy, -Follow 2D echocardiogram to rule out cardiomyopathy/underlying CHF, still pending  -For now continue incentive spirometry, inhalers, mobilize, O2 -Will need home O2 evaluation prior to discharge.  At that encounter, using CPAP states it is helping her breathing.   -Recommended to use incentive spirometry  Active problems Anemia of  chronic disease, malignancy, history of colon CA with mets Hemoglobin 7.7, baseline 8-9 -Possible symptomatic anemia causing shortness of breath.  No active bleeding, will transfuse 1 unit packed RBCs.  Asthma: -Currently no coughing or wheezing -Continue ICS/LABA, Singulair, as needed albuterol  History of colon CA with metastasis -Currently undergoing chemotherapy, was seen at [facility] on 4/23 and was sent to ED due to hypoxia and tachycardia. -Continue oncology follow-up after hospitalization   Hypertension -BP now stable, continue to hold antihypertensives today  Anxiety Continue as needed Valium  Chronic pain syndrome Currently no acute pain issues, continue home regimen  OSA Continue CPAP  Morbid obesity Estimated body mass index is 54.64 kg/m as calculated from the following:   Height as of this encounter: 5\' 8"  (1.727 m).   Weight as of this encounter: 163 kg.   Generalized debility Per patient, fall risk, will have PT evaluation tomorrow  Code Status: Full CODE STATUS DVT Prophylaxis:  Lovenox Family Communication: Discussed all imaging results, lab results, explained to the patient, Doristine Bosworth in the room  Disposition Plan:     Status is: Inpatient  The patient will require care spanning >  2 midnights and should be moved to inpatient because: Inpatient level of care appropriate due to severity of illness,  Still short of breath, work-up in progress  Dispo: The patient is from: Home              Anticipated d/c is to: Home              Anticipated d/c date is: 2 days              Patient currently is not medically stable to d/c.      Time Spent in minutes 25 minutes  Procedures:  None  Consultants:   None  Antimicrobials:   Anti-infectives (From admission, onward)   None         Medications  Scheduled Meds: . Chlorhexidine Gluconate Cloth  6 each Topical Daily  . dicyclomine  20 mg Oral TID AC  . enoxaparin (LOVENOX) injection  80  mg Subcutaneous Q24H  . mometasone-formoterol  2 puff Inhalation BID  . montelukast  10 mg Oral QHS  . pantoprazole  40 mg Oral QAC breakfast   Continuous Infusions: PRN Meds:.acetaminophen **OR** acetaminophen, albuterol, bisacodyl, diazepam, LORazepam, ondansetron **OR** ondansetron (ZOFRAN) IV, senna-docusate, traMADol      Subjective:   [name] was seen and examined today.  Still having some shortness of breath, using CPAP, states it helps.  Low-grade temp of 100 F.  No nausea or vomiting.  Her pastor at bedside.  No chest pain.   No chest pain. Patient denies abdominal pain, N/V/D/C, new weakness, numbess, tingling. No acute events overnight.    Objective:   Vitals:   01/07/20 2046 01/07/20 2050 01/08/20 0603 01/08/20 1037  BP:  120/73 123/90   Pulse: 96 92 80   Resp: 18 20 20    Temp:  100 F (37.8 C) 98 F (36.7 C)   TempSrc:  Oral Oral   SpO2: 99% 99% 100% 100%  Weight:      Height:        Intake/Output Summary (Last 24 hours) at 01/08/2020 1354 Last data filed at 01/07/2020 2140 Gross per 24 hour  Intake --  Output 500 ml  Net -500 ml     Wt Readings from Last 3 Encounters:  01/07/20 (!) 163 kg  12/21/19 (!) 171.1 kg  12/07/19 (!) 171.2 kg   Physical Exam  General: Alert and oriented x 3, NAD, CPAP on  Cardiovascular: S1 S2 clear, RRR. No pedal edema b/l  Respiratory: Decreased breath sound at the bases  Gastrointestinal: Soft, nontender, nondistended, NBS  Ext: no pedal edema bilaterally  Neuro: no new deficits  Musculoskeletal: No cyanosis, clubbing  Skin: No rashes  Psych: Normal affect and demeanor, alert and oriented x3    Data Reviewed:  I have personally reviewed following labs and imaging studies  Micro Results Recent Results (from the past 240 hour(s))  Respiratory Panel by RT PCR (Flu A&B, Covid) - Nasopharyngeal Swab     Status: None   Collection Time: 01/06/20  9:35 PM   Specimen: Nasopharyngeal Swab  Result Value  Ref Range Status   SARS Coronavirus 2 by RT PCR NEGATIVE NEGATIVE Final    Comment: (NOTE) SARS-CoV-2 target nucleic acids are NOT DETECTED. The SARS-CoV-2 RNA is generally detectable in upper respiratoy specimens during the acute phase of infection. The lowest concentration of SARS-CoV-2 viral copies this assay can detect is 131 copies/mL. A negative result does not preclude SARS-Cov-2 infection and should not be used as the  sole basis for treatment or other patient management decisions. A negative result may occur with  improper specimen collection/handling, submission of specimen other than nasopharyngeal swab, presence of viral mutation(s) within the areas targeted by this assay, and inadequate number of viral copies (<131 copies/mL). A negative result must be combined with clinical observations, patient history, and epidemiological information. The expected result is Negative. Fact Sheet for Patients:  PinkCheek.be Fact Sheet for Healthcare Providers:  GravelBags.it This test is not yet ap proved or cleared by the [place] FDA and  has been authorized for detection and/or diagnosis of SARS-CoV-2 by FDA under an Emergency Use Authorization (EUA). This EUA will remain  in effect (meaning this test can be used) for the duration of the COVID-19 declaration under Section 564(b)(1) of the Act, 21 U.S.C. section 360bbb-3(b)(1), unless the authorization is terminated or revoked sooner.    Influenza A by PCR NEGATIVE NEGATIVE Final   Influenza B by PCR NEGATIVE NEGATIVE Final    Comment: (NOTE) The Xpert Xpress SARS-CoV-2/FLU/RSV assay is intended as an aid in  the diagnosis of influenza from Nasopharyngeal swab specimens and  should not be used as a sole basis for treatment. Nasal washings and  aspirates are unacceptable for Xpert Xpress SARS-CoV-2/FLU/RSV  testing. Fact Sheet for  Patients: PinkCheek.be Fact Sheet for Healthcare Providers: GravelBags.it This test is not yet approved or cleared by the [place] FDA and  has been authorized for detection and/or diagnosis of SARS-CoV-2 by  FDA under an Emergency Use Authorization (EUA). This EUA will remain  in effect (meaning this test can be used) for the duration of the  Covid-19 declaration under Section 564(b)(1) of the Act, 21  U.S.C. section 360bbb-3(b)(1), unless the authorization is  terminated or revoked. Performed at [facility], [place] [address] [place], [place] [address]     Radiology Reports CT Angio Chest PE W/Cm &/Or Wo Cm  Result Date: 01/06/2020 CLINICAL DATA:  Shortness of breath. History of metastatic colon cancer. EXAM: CT ANGIOGRAPHY CHEST WITH CONTRAST TECHNIQUE: Multidetector CT imaging of the chest was performed using the standard protocol during bolus administration of intravenous contrast. Multiplanar CT image reconstructions and MIPs were obtained to evaluate the vascular anatomy. CONTRAST:  18mL OMNIPAQUE IOHEXOL 350 MG/ML SOLN COMPARISON:  10/27/2019.  09/12/2019. FINDINGS: Cardiovascular: Pulmonary arterial opacification is moderate. No visible pulmonary emboli. Study affectively excludes large central emboli. Small peripheral emboli might be inapparent. No evidence of aortic pathology. Heart size is normal. Mediastinum/Nodes: No mass or adenopathy. Lungs/Pleura: No evidence of pulmonary metastatic disease. There is atelectasis in the right lower lobe which could relate to shortness of breath. Left lung as well aerated. No pleural effusion. Upper Abdomen: Hepatic metastatic disease not optimally evaluated using this technique. Hiatal hernia. Musculoskeletal: Negative Review of the MIP images confirms the above findings. IMPRESSION: Moderate atelectasis in the right lower lobe could relate 2 shortness of breath. No  visible pulmonary emboli. Pulmonary opacification is moderate. The study excludes large and central emboli. Small peripheral emboli might be inapparent given the degree of vascular opacification. No specific positive evidence for that however. Electronically Signed   By: Nelson Chimes M.D.   On: 01/06/2020 18:43    Lab Data:  CBC: Recent Labs  Lab 01/06/20 1127 01/07/20 0600 01/08/20 0500  WBC 10.3 10.1 8.6  NEUTROABS 6.5  --   --   HGB 9.1* 8.0* 7.7*  HCT 31.8* 27.5* 26.5*  MCV 94.1 95.8 96.4  PLT 278 232 222  Basic Metabolic Panel: Recent Labs  Lab 01/06/20 1127 01/07/20 0600 01/08/20 0500  NA 147* 143 142  K 3.7 3.5 3.9  CL 110 107 109  CO2 26 26 26   GLUCOSE 105* 97 97  BUN 14 18 19   CREATININE 1.13* 1.15* 1.14*  CALCIUM 9.4 8.6* 8.2*   GFR: Estimated Creatinine Clearance: 96.5 mL/min (A) (by C-G formula based on SCr of 1.14 mg/dL (H)). Liver Function Tests: Recent Labs  Lab 01/06/20 1127  AST 27  ALT 13  ALKPHOS 111  BILITOT 0.5  PROT 7.6  ALBUMIN 2.4*   No results for input(s): LIPASE, AMYLASE in the last 168 hours. No results for input(s): AMMONIA in the last 168 hours. Coagulation Profile: No results for input(s): INR, PROTIME in the last 168 hours. Cardiac Enzymes: No results for input(s): CKTOTAL, CKMB, CKMBINDEX, TROPONINI in the last 168 hours. BNP (last 3 results) No results for input(s): PROBNP in the last 8760 hours. HbA1C: No results for input(s): HGBA1C in the last 72 hours. CBG: No results for input(s): GLUCAP in the last 168 hours. Lipid Profile: No results for input(s): CHOL, HDL, LDLCALC, TRIG, CHOLHDL, LDLDIRECT in the last 72 hours. Thyroid Function Tests: No results for input(s): TSH, T4TOTAL, FREET4, T3FREE, THYROIDAB in the last 72 hours. Anemia Panel: No results for input(s): VITAMINB12, FOLATE, FERRITIN, TIBC, IRON, RETICCTPCT in the last 72 hours. Urine analysis:    Component Value Date/Time   COLORURINE YELLOW 07/10/2019  1202   APPEARANCEUR CLEAR 07/10/2019 1202   LABSPEC 1.018 07/10/2019 1202   PHURINE 5.0 07/10/2019 1202   GLUCOSEU NEGATIVE 07/10/2019 1202   HGBUR NEGATIVE 07/10/2019 1202   BILIRUBINUR NEGATIVE 07/10/2019 [place] 07/10/2019 1202   PROTEINUR NEGATIVE 12/07/2019 1412   UROBILINOGEN 0.2 01/06/2011 1755   NITRITE NEGATIVE 07/10/2019 [place] 07/10/2019 [place] M.D. [facility] 01/08/2020, 1:54 PM   Call night coverage person covering after 7pm

## 2020-01-08 NOTE — Progress Notes (Signed)
  Echocardiogram 2D Echocardiogram has been performed.  Margaret [name] 01/08/2020, 3:36 PM

## 2020-01-09 ENCOUNTER — Encounter (HOSPITAL_COMMUNITY): Payer: Self-pay | Admitting: Family Medicine

## 2020-01-09 LAB — CBC
HCT: 29.6 % — ABNORMAL LOW (ref 36.0–46.0)
Hemoglobin: 8.6 g/dL — ABNORMAL LOW (ref 12.0–15.0)
MCH: 27.4 pg (ref 26.0–34.0)
MCHC: 29.1 g/dL — ABNORMAL LOW (ref 30.0–36.0)
MCV: 94.3 fL (ref 80.0–100.0)
Platelets: 230 10*3/uL (ref 150–400)
RBC: 3.14 MIL/uL — ABNORMAL LOW (ref 3.87–5.11)
RDW: 18.1 % — ABNORMAL HIGH (ref 11.5–15.5)
WBC: 9.8 10*3/uL (ref 4.0–10.5)
nRBC: 0 % (ref 0.0–0.2)

## 2020-01-09 LAB — BPAM RBC
Blood Product Expiration Date: 202105082359
ISSUE DATE / TIME: 202104251815
Unit Type and Rh: 6200

## 2020-01-09 LAB — BASIC METABOLIC PANEL
Anion gap: 6 (ref 5–15)
BUN: 17 mg/dL (ref 6–20)
CO2: 25 mmol/L (ref 22–32)
Calcium: 8.5 mg/dL — ABNORMAL LOW (ref 8.9–10.3)
Chloride: 107 mmol/L (ref 98–111)
Creatinine, Ser: 1.2 mg/dL — ABNORMAL HIGH (ref 0.44–1.00)
GFR calc Af Amer: >60 mL/min (ref >60)
GFR calc non Af Amer: 53 mL/min — ABNORMAL LOW (ref >60)
Glucose, Bld: 90 mg/dL (ref 70–99)
Potassium: 4 mmol/L (ref 3.5–5.1)
Sodium: 138 mmol/L (ref 135–145)

## 2020-01-09 LAB — BRAIN NATRIURETIC PEPTIDE: B Natriuretic Peptide: 73.1 pg/mL (ref 0.0–100.0)

## 2020-01-09 LAB — TYPE AND SCREEN
ABO/RH(D): A POS
Antibody Screen: NEGATIVE
Unit division: 0

## 2020-01-09 MED ORDER — PRO-STAT SUGAR FREE PO LIQD
30.0000 mL | Freq: Two times a day (BID) | ORAL | Status: DC
  Administered 2020-01-09 – 2020-01-10 (×3): 30 mL via ORAL
  Filled 2020-01-09 (×3): qty 30

## 2020-01-09 MED ORDER — FUROSEMIDE 40 MG PO TABS
40.0000 mg | ORAL_TABLET | Freq: Once | ORAL | Status: AC
  Administered 2020-01-09: 40 mg via ORAL
  Filled 2020-01-09: qty 1

## 2020-01-09 MED ORDER — FUROSEMIDE 40 MG PO TABS: 40.0000 mg | ORAL_TABLET | Freq: Every day | ORAL | Status: DC

## 2020-01-09 NOTE — Progress Notes (Signed)
Patient requesting to take inhaler at later time. Will attempt later in the morning.

## 2020-01-09 NOTE — Progress Notes (Signed)
[facility]                                                                              Patient Demographics  Margaret [name], is a 49 y.o. female, DOB - Nov 19, 1970, [id number]  Admit date - 01/06/2020   Admitting Physician Vianne Bulls, MD  Outpatient Primary MD for the patient is Nolene Ebbs, MD  Outpatient specialists:   LOS - 2  days   Medical records reviewed and are as summarized below:    Chief Complaint  Patient presents with  . Shortness of Breath       Brief summary   Patient is a 49 year old female with history of colon CA with liver metastasis, anxiety, hypertension, chronic pain, OSA on CPAP presented with shortness of breath, fatigue, 3 to 4 days of poor oral intake, dyspnea.  Patient reported similar symptoms previously attributed to symptomatic anemia, no melena or hematochezia.  She has a history of asthma but no recent coughing or wheezing. In ED, afebrile, O2 sats 70s with ambulation in ED, tachycardiac up to 120, BP in 90s.  CT angiogram chest was negative for PE but moderate atelectasis in the right lower lobe creatinine 1.1.  Patient received normal saline fluid bolus and was placed on IV fluids COVID-19 test, influenza panel negative  Assessment & Plan    Principal Problem:   Acute respiratory failure with hypoxia (HCC) -Unclear etiology, reported 3 to 4 days history of dyspnea with hypoxia, O2 sat in 70s with mild exertion. Has underlying history of asthma, OSA, likely OHV -CTA chest negative for pulmonary embolism but notable for atelectasis in the right lower lobe -Patient on chemotherapy, continue incentive spirometry, inhalers, mobilize, O2 -2D echo showed EF of 60 to [id number], grade 1 diastolic dysfunction, normal right ventricular systolic function, moderately elevated pulmonary artery pressure.  Troponin x3 - - hemoglobin was 7.7 on 4/25 gave 1 unit of pBC transfusion, hemoglobin today at 8.6 -Requested PT evaluation to  assess patient's functional status, mobility and oxygen saturation.  On ambulation, O2 sats dropped to 83% on room air, heart rate 100, was placed on O2 3 L.  Recommended home health PT, rolling walker - will check BNP, Lasix 40 mg x1 and reassess (? Diastolic CHF, no pulmonary edema on chest x-ray but c/o SOB on exertion, was on IVF on admission)  Active problems Anemia of chronic disease, malignancy, history of colon CA with mets Hemoglobin 7.7, baseline 8-9 -Possible symptomatic anemia causing shortness of breath.  No active bleeding, transfused 1 unit packed RBC on 5/25  Asthma: -Currently no coughing or wheezing -Continue ICS/LABA, Singulair, as needed albuterol  History of colon CA with metastasis -Currently undergoing chemotherapy, was seen at [facility] on 4/23 and was sent to ED due to hypoxia and tachycardia. -Continue oncology follow-up after hospitalization   Hypertension -BP now stable  Anxiety Continue as needed Valium  Chronic pain syndrome Currently no acute pain issues, continue home regimen  OSA Continue CPAP  Morbid obesity Estimated body mass index is 54.64 kg/m as calculated from the following:   Height as of this encounter: 5\' 8"  (1.727 m).  Weight as of this encounter: 163 kg.   Generalized debility, gait instability PT evaluation done  Code Status: Full CODE STATUS DVT Prophylaxis:  Lovenox Family Communication: Discussed all imaging results, lab results, explained to the patient, Doristine Bosworth in the room  Disposition Plan:     Status is: Inpatient  The patient will require care spanning > 2 midnights and should be moved to inpatient because: Inpatient level of care appropriate due to severity of illness,  Still short of breath, work-up in progress  Dispo: The patient is from: Home              Anticipated d/c is to: Home              Anticipated d/c date is: 2 days              Patient currently is not medically stable to d/c.      Time  Spent in minutes 25 minutes  Procedures:  None  Consultants:   None  Antimicrobials:   Anti-infectives (From admission, onward)   None         Medications  Scheduled Meds: . Chlorhexidine Gluconate Cloth  6 each Topical Daily  . dicyclomine  20 mg Oral TID AC  . enoxaparin (LOVENOX) injection  80 mg Subcutaneous Q24H  . feeding supplement (PRO-STAT SUGAR FREE 64)  30 mL Oral BID  . mometasone-formoterol  2 puff Inhalation BID  . montelukast  10 mg Oral QHS  . pantoprazole  40 mg Oral QAC breakfast   Continuous Infusions: PRN Meds:.acetaminophen **OR** acetaminophen, albuterol, bisacodyl, diazepam, LORazepam, ondansetron **OR** ondansetron (ZOFRAN) IV, senna-docusate, traMADol      Subjective:   Margaret [name] was seen and examined today.  States still feels shortness of breath on exertion with ambulation to the bathroom no fevers or chills, no chest pain.   Patient denies abdominal pain, N/V/D/C, new weakness, numbess, tingling. No acute events overnight.    Objective:   Vitals:   01/08/20 2118 01/09/20 0518 01/09/20 1049 01/09/20 1215  BP: 138/87 122/81  129/78  Pulse: 81 80  73  Resp: (!) 22 18  18   Temp: 98 F (36.7 C) 98.1 F (36.7 C)  98.3 F (36.8 C)  TempSrc: Oral Oral  Oral  SpO2: 99% 100% 100% 99%  Weight:      Height:        Intake/Output Summary (Last 24 hours) at 01/09/2020 1615 Last data filed at 01/09/2020 0846 Gross per 24 hour  Intake 788 ml  Output --  Net 788 ml     Wt Readings from Last 3 Encounters:  01/07/20 (!) 163 kg  12/21/19 (!) 171.1 kg  12/07/19 (!) 171.2 kg   Physical Exam  General: Alert and oriented x 3, NAD  Cardiovascular: S1 S2 clear, RRR. No pedal edema b/l  Respiratory: Diminished breath sound at the bases  Gastrointestinal: Obese, soft, nontender, nondistended, NBS  Ext: no pedal edema bilaterally  Neuro: no new deficits  Musculoskeletal: No cyanosis, clubbing  Skin: No rashes  Psych: Normal  affect and demeanor, alert and oriented x3       Data Reviewed:  I have personally reviewed following labs and imaging studies  Micro Results Recent Results (from the past 240 hour(s))  Respiratory Panel by RT PCR (Flu A&B, Covid) - Nasopharyngeal Swab     Status: None   Collection Time: 01/06/20  9:35 PM   Specimen: Nasopharyngeal Swab  Result Value Ref Range Status   SARS  Coronavirus 2 by RT PCR NEGATIVE NEGATIVE Final    Comment: (NOTE) SARS-CoV-2 target nucleic acids are NOT DETECTED. The SARS-CoV-2 RNA is generally detectable in upper respiratoy specimens during the acute phase of infection. The lowest concentration of SARS-CoV-2 viral copies this assay can detect is 131 copies/mL. A negative result does not preclude SARS-Cov-2 infection and should not be used as the sole basis for treatment or other patient management decisions. A negative result may occur with  improper specimen collection/handling, submission of specimen other than nasopharyngeal swab, presence of viral mutation(s) within the areas targeted by this assay, and inadequate number of viral copies (<131 copies/mL). A negative result must be combined with clinical observations, patient history, and epidemiological information. The expected result is Negative. Fact Sheet for Patients:  PinkCheek.be Fact Sheet for Healthcare Providers:  GravelBags.it This test is not yet ap proved or cleared by the [place] FDA and  has been authorized for detection and/or diagnosis of SARS-CoV-2 by FDA under an Emergency Use Authorization (EUA). This EUA will remain  in effect (meaning this test can be used) for the duration of the COVID-19 declaration under Section 564(b)(1) of the Act, 21 U.S.C. section 360bbb-3(b)(1), unless the authorization is terminated or revoked sooner.    Influenza A by PCR NEGATIVE NEGATIVE Final   Influenza B by PCR NEGATIVE NEGATIVE  Final    Comment: (NOTE) The Xpert Xpress SARS-CoV-2/FLU/RSV assay is intended as an aid in  the diagnosis of influenza from Nasopharyngeal swab specimens and  should not be used as a sole basis for treatment. Nasal washings and  aspirates are unacceptable for Xpert Xpress SARS-CoV-2/FLU/RSV  testing. Fact Sheet for Patients: PinkCheek.be Fact Sheet for Healthcare Providers: GravelBags.it This test is not yet approved or cleared by the [place] FDA and  has been authorized for detection and/or diagnosis of SARS-CoV-2 by  FDA under an Emergency Use Authorization (EUA). This EUA will remain  in effect (meaning this test can be used) for the duration of the  Covid-19 declaration under Section 564(b)(1) of the Act, 21  U.S.C. section 360bbb-3(b)(1), unless the authorization is  terminated or revoked. Performed at [facility], [place] [address], [place], [place] [address]     Radiology Reports CT Angio Chest PE W/Cm &/Or Wo Cm  Result Date: 01/06/2020 CLINICAL DATA:  Shortness of breath. History of metastatic colon cancer. EXAM: CT ANGIOGRAPHY CHEST WITH CONTRAST TECHNIQUE: Multidetector CT imaging of the chest was performed using the standard protocol during bolus administration of intravenous contrast. Multiplanar CT image reconstructions and MIPs were obtained to evaluate the vascular anatomy. CONTRAST:  152mL OMNIPAQUE IOHEXOL 350 MG/ML SOLN COMPARISON:  10/27/2019.  09/12/2019. FINDINGS: Cardiovascular: Pulmonary arterial opacification is moderate. No visible pulmonary emboli. Study affectively excludes large central emboli. Small peripheral emboli might be inapparent. No evidence of aortic pathology. Heart size is normal. Mediastinum/Nodes: No mass or adenopathy. Lungs/Pleura: No evidence of pulmonary metastatic disease. There is atelectasis in the right lower lobe which could relate to shortness of breath. Left  lung as well aerated. No pleural effusion. Upper Abdomen: Hepatic metastatic disease not optimally evaluated using this technique. Hiatal hernia. Musculoskeletal: Negative Review of the MIP images confirms the above findings. IMPRESSION: Moderate atelectasis in the right lower lobe could relate 2 shortness of breath. No visible pulmonary emboli. Pulmonary opacification is moderate. The study excludes large and central emboli. Small peripheral emboli might be inapparent given the degree of vascular opacification. No specific positive evidence for that however.  Electronically Signed   By: Nelson Chimes M.D.   On: 01/06/2020 18:43   ECHOCARDIOGRAM COMPLETE  Result Date: 01/08/2020    ECHOCARDIOGRAM REPORT   Patient Name:   Margaret [name] Date of Exam: 01/08/2020 Medical Rec #:  [id number]          Height:       68.0 in Accession #:    [id number]         Weight:       359.3 lb Date of Birth:  [birth year]         BSA:          2.622 m Patient Age:    49 years           BP:           126/82 mmHg Patient Gender: F                  HR:           92 bpm. Exam Location:  Inpatient Procedure: 2D Echo Indications:    dyspnea  History:        Patient has prior history of Echocardiogram examinations, most                 recent 09/03/2018. Risk Factors:Hypertension and Former Smoker.  Sonographer:    Jannett Celestine RDCS (AE) Referring Phys: [id number] Shiraz Bastyr K Kole Hilyard IMPRESSIONS  1. Left ventricular ejection fraction, by estimation, is 60 to 65%. The left ventricle has normal function. The left ventricle has no regional wall motion abnormalities. Left ventricular diastolic parameters are consistent with Grade I diastolic dysfunction (impaired relaxation).  2. Right ventricular systolic function is normal. The right ventricular size is mildly enlarged. There is moderately elevated pulmonary artery systolic pressure. The estimated right ventricular systolic pressure is [id number] mmHg.  3. The mitral valve is normal in structure. Trivial  mitral valve regurgitation. No evidence of mitral stenosis.  4. Tricuspid valve regurgitation is mild to moderate.  5. The aortic valve is normal in structure. Aortic valve regurgitation is not visualized. No aortic stenosis is present.  6. The inferior vena cava is normal in size with greater than 50% respiratory variability, suggesting right atrial pressure of 3 mmHg. Comparison(s): No significant change from prior study. Prior images reviewed side by side. Estimated PA pressure is reduced when compared to prior study. FINDINGS  Left Ventricle: Left ventricular ejection fraction, by estimation, is 60 to 65%. The left ventricle has normal function. The left ventricle has no regional wall motion abnormalities. The left ventricular internal cavity size was normal in size. There is  no left ventricular hypertrophy. Left ventricular diastolic parameters are consistent with Grade I diastolic dysfunction (impaired relaxation). Right Ventricle: The right ventricular size is mildly enlarged. No increase in right ventricular wall thickness. Right ventricular systolic function is normal. There is moderately elevated pulmonary artery systolic pressure. The tricuspid regurgitant velocity is 3.11 m/s, and with an assumed right atrial pressure of 3 mmHg, the estimated right ventricular systolic pressure is [id number] mmHg. Left Atrium: Left atrial size was normal in size. Right Atrium: Right atrial size was normal in size. Pericardium: There is no evidence of pericardial effusion. Mitral Valve: The mitral valve is normal in structure. Normal mobility of the mitral valve leaflets. Trivial mitral valve regurgitation. No evidence of mitral valve stenosis. Tricuspid Valve: The tricuspid valve is normal in structure. Tricuspid valve regurgitation is mild to moderate. No evidence of tricuspid stenosis. Aortic Valve: The aortic  valve is normal in structure. Aortic valve regurgitation is not visualized. No aortic stenosis is present. Pulmonic  Valve: The pulmonic valve was normal in structure. Pulmonic valve regurgitation is not visualized. No evidence of pulmonic stenosis. Aorta: The aortic root is normal in size and structure. Venous: The inferior vena cava is normal in size with greater than 50% respiratory variability, suggesting right atrial pressure of 3 mmHg. IAS/Shunts: No atrial level shunt detected by color flow Doppler.  LEFT VENTRICLE PLAX 2D LVIDd:         5.20 cm  Diastology LVIDs:         2.60 cm  LV e' lateral:   11.30 cm/s LV PW:         1.30 cm  LV E/e' lateral: 5.7 LV IVS:        0.90 cm  LV e' medial:    7.29 cm/s LVOT diam:     2.40 cm  LV E/e' medial:  8.9 LV SV:         60 LV SV Index:   23 LVOT Area:     4.52 cm  LEFT ATRIUM         Index LA diam:    4.00 cm 1.53 cm/m  AORTIC VALVE LVOT Vmax:   79.30 cm/s LVOT Vmean:  63.300 cm/s LVOT VTI:    0.132 m  AORTA Ao Root diam: 3.00 cm MITRAL VALVE               TRICUSPID VALVE MV Area (PHT): 3.54 cm    TR Peak grad:   38.7 mmHg MV Decel Time: 214 msec    TR Vmax:        311.00 cm/s MV E velocity: 64.90 cm/s MV A velocity: 74.70 cm/s  SHUNTS MV E/A ratio:  0.87        Systemic VTI:  0.13 m                            Systemic Diam: 2.40 cm Candee Furbish MD Electronically signed by Candee Furbish MD Signature Date/Time: 01/08/2020/3:46:53 PM    Final     Lab Data:  CBC: Recent Labs  Lab 01/06/20 1127 01/07/20 0600 01/08/20 0500 01/09/20 0547  WBC 10.3 10.1 8.6 9.8  NEUTROABS 6.5  --   --   --   HGB 9.1* 8.0* 7.7* 8.6*  HCT 31.8* 27.5* 26.5* 29.6*  MCV 94.1 95.8 96.4 94.3  PLT 278 232 222 [id number]   Basic Metabolic Panel: Recent Labs  Lab 01/06/20 1127 01/07/20 0600 01/08/20 0500 01/09/20 0547  NA 147* 143 142 138  K 3.7 3.5 3.9 4.0  CL 110 107 109 107  CO2 26 26 26 25   GLUCOSE 105* 97 97 90  BUN 14 18 19 17   CREATININE 1.13* 1.15* 1.14* 1.20*  CALCIUM 9.4 8.6* 8.2* 8.5*   GFR: Estimated Creatinine Clearance: 91.6 mL/min (A) (by C-G formula based on SCr of 1.2  mg/dL (H)). Liver Function Tests: Recent Labs  Lab 01/06/20 1127  AST 27  ALT 13  ALKPHOS 111  BILITOT 0.5  PROT 7.6  ALBUMIN 2.4*   No results for input(s): LIPASE, AMYLASE in the last 168 hours. No results for input(s): AMMONIA in the last 168 hours. Coagulation Profile: No results for input(s): INR, PROTIME in the last 168 hours. Cardiac Enzymes: No results for input(s): CKTOTAL, CKMB, CKMBINDEX, TROPONINI in the last 168 hours. BNP (last 3 results) No  results for input(s): PROBNP in the last 8760 hours. HbA1C: No results for input(s): HGBA1C in the last 72 hours. CBG: No results for input(s): GLUCAP in the last 168 hours. Lipid Profile: No results for input(s): CHOL, HDL, LDLCALC, TRIG, CHOLHDL, LDLDIRECT in the last 72 hours. Thyroid Function Tests: No results for input(s): TSH, T4TOTAL, FREET4, T3FREE, THYROIDAB in the last 72 hours. Anemia Panel: No results for input(s): VITAMINB12, FOLATE, FERRITIN, TIBC, IRON, RETICCTPCT in the last 72 hours. Urine analysis:    Component Value Date/Time   COLORURINE YELLOW 07/10/2019 1202   APPEARANCEUR CLEAR 07/10/2019 1202   LABSPEC 1.018 07/10/2019 1202   PHURINE 5.0 07/10/2019 1202   GLUCOSEU NEGATIVE 07/10/2019 1202   HGBUR NEGATIVE 07/10/2019 1202   BILIRUBINUR NEGATIVE 07/10/2019 [place] 07/10/2019 1202   PROTEINUR NEGATIVE 12/07/2019 1412   UROBILINOGEN 0.2 01/06/2011 1755   NITRITE NEGATIVE 07/10/2019 [place] 07/10/2019 [place] M.D. [facility] 01/09/2020, 4:15 PM   Call night coverage person covering after 7pm

## 2020-01-09 NOTE — Progress Notes (Addendum)
Initial Nutrition Assessment  DOCUMENTATION CODES:   Morbid obesity  INTERVENTION:  -ProStat 30 ml BID (each 30 ml provides 100 kcal, 15 gr protein)   -Heart Healthy diet education handout attached to d/c instructions  NUTRITION DIAGNOSIS:   Increased nutrient needs related to cancer and cancer related treatments as evidenced by estimated needs.   GOAL:  Patient will meet greater than or equal to 90% of their needs   MONITOR:  PO intake, Weight trends, Supplement acceptance, Labs    REASON FOR ASSESSMENT:  Malnutrition Screening Tool    ASSESSMENT:  RD working remotely  Patient is an obese 49 yo female with hx of colon cancer with metastasis to liver (undergoing chemotherapy), Chronic pain, HTN and OSA. She presents with acute hypoxic respiratory failure.    Meal intake initially poor at 25% on 4/24. Yesterday was significantly improved 100% of 2 meals consumed. Will add protein modular to support adequacy and monitor meal intake.    Labs reviewed: Cr. 1.20, Hemoglobin 8.6 (L)  Medications reviewed and include: Protonix, Bentyl  Weight: 160 kg (358.6 lb). Loss of 8 kg, (~5%) this month and 10.8%, 19.7 kg since 10/20/19 which is significant. At risk for malnutrition given her poor oral intake prior to admission and unplanned wt loss while receiving cancer treatment.   NUTRITION - FOCUSED PHYSICAL EXAM: Unable to complete Nutrition-Focused physical exam at this time.  RD working remotely.  Diet Order:   Diet Order            Diet Heart Room service appropriate? Yes; Fluid consistency: Thin  Diet effective now              EDUCATION NEEDS:  Education needs have been addressed(handout included in discharge instructions)   Skin:  Skin Assessment: Reviewed RN Assessment  Last BM:  4/25  Height:   Ht Readings from Last 1 Encounters:  01/07/20 5\' 8"  (1.727 m)    Weight:   Wt Readings from Last 1 Encounters:  01/07/20 (!) 163 kg    Ideal Body Weight:   64  kg  BMI:  Body mass index is 54.64 kg/m.  Estimated Nutritional Needs:   Kcal:  2200-2400  Protein:  90-102 gr  Fluid:  2.2-2.4 liters daily  Colman Cater MS,RD,CSG,LDN Contact: available through [employer]

## 2020-01-09 NOTE — Evaluation (Signed)
Physical Therapy Evaluation Patient Details Name: Margaret [name] MRN: [id number] DOB: 02-28-1971 Today's Date: 01/09/2020   History of Present Illness  Patient is a 49 year old female with history of colon CA with liver metastasis, receiving chemotherapy,anxiety, hypertension, chronic pain, OSA on CPAP presented 01/06/20 with shortness of breath, fatigue, pooor oral intake, dyspnea, O2 sats 70s with ambulation in ED, tachycardiac up to 120, BP in 90s.  CT angiogram chest was negative for PE but moderate atelectasis in the right lower lobe. H/O Covid. Hgb 7.7  Clinical Impression  The patient is eager to mobilize. Patient ambulated x 30' on Ra with SP2 dropping to 83%,  HR 100. Patient also reports feeling dizzy so had to sit down. Placed  Oxygen on 3 L and ambulated x 12', blood pressure 143/95,  HR 79 post rest. Patient may require O2 at home. Will need another sat walk test in AM using RW. Pt admitted with above diagnosis.  Pt currently with functional limitations due to the deficits listed below (see PT Problem List). Pt will benefit from skilled PT to increase their independence and safety with mobility to allow discharge to the venue listed below.     Follow Up Recommendations Home health PT    Equipment Recommendations  Bariatric Rolling walker with 5" wheels(pt. has a rollator but it is not assembled and pt. unable to do so, she really needs a RW for safety ASAP.)    Recommendations for Other Services       Precautions / Restrictions Precautions Precautions: Fall Precaution Comments: reports right knee goes out      Mobility  Bed Mobility Overal bed mobility: Modified Independent             General bed mobility comments: extra time and effort  Transfers Overall transfer level: Needs assistance Equipment used: Straight cane Transfers: Sit to/from Stand Sit to Stand: Min guard;Min assist         General transfer comment: from bed stands with a thrust up, leans  forward, then straightens up, more steady asssit from chair , using cane  Ambulation/Gait Ambulation/Gait assistance: Min assist;Min guard Gait Distance (Feet): 30 Feet(then 12') Assistive device: Straight cane Gait Pattern/deviations: Step-to pattern;Wide base of support Gait velocity: decr   General Gait Details: steady assist, at times held to bed, unsteady when she bacame increased SOB and reported feeling dizzy. had patient sit down and rest. SPO2 on TRA dropped to 82%. Replaced to 3 l with SPO2 back to 100%  Stairs            Wheelchair Mobility    Modified Rankin (Stroke Patients Only)       Balance Overall balance assessment: History of Falls;Needs assistance Sitting-balance support: Feet supported;Bilateral upper extremity supported Sitting balance-Leahy Scale: Good     Standing balance support: Single extremity supported;During functional activity Standing balance-Leahy Scale: Poor Standing balance comment: with ambulation                             Pertinent Vitals/Pain Pain Assessment: Faces Faces Pain Scale: Hurts little more Pain Location: R knee Pain Descriptors / Indicators: Discomfort Pain Intervention(s): Monitored during session;Limited activity within patient's tolerance    Home Living Family/patient expects to be discharged to:: Private residence Living Arrangements: Children Available Help at Discharge: Family;Available 24 hours/day;Personal care attendant Type of Home: Apartment Home Access: Level entry     Home Layout: One level Home Equipment: Walker - 4 wheels;Cane -  single point;Shower seat Additional Comments: pt has a 4 wheeled RW and a tub seat but  neither have benn assembled and patient does not have the means to assemble.    Prior Function Level of Independence: Needs assistance   Gait / Transfers Assistance Needed: with SPC  ADL's / Homemaking Assistance Needed: had 2 x week for bath        Hand Dominance         Extremity/Trunk Assessment   Upper Extremity Assessment Upper Extremity Assessment: Generalized weakness    Lower Extremity Assessment Lower Extremity Assessment: Generalized weakness    Cervical / Trunk Assessment Cervical / Trunk Assessment: Normal  Communication   Communication: No difficulties  Cognition Arousal/Alertness: Awake/alert Behavior During Therapy: WFL for tasks assessed/performed Overall Cognitive Status: Within Functional Limits for tasks assessed                                        General Comments      Exercises     Assessment/Plan    PT Assessment Patient needs continued PT services  PT Problem List Decreased strength;Decreased balance;Decreased knowledge of precautions;Decreased mobility;Decreased knowledge of use of DME;Cardiopulmonary status limiting activity;Decreased activity tolerance;Decreased safety awareness       PT Treatment Interventions DME instruction;Functional mobility training;Gait training;Therapeutic activities;Therapeutic exercise    PT Goals (Current goals can be found in the Care Plan section)  Acute Rehab PT Goals Patient Stated Goal: to go home PT Goal Formulation: With patient Time For Goal Achievement: 01/23/20 Potential to Achieve Goals: Good    Frequency Min 3X/week   Barriers to discharge        Co-evaluation               AM-PAC PT "6 Clicks" Mobility  Outcome Measure Help needed turning from your back to your side while in a flat bed without using bedrails?: None Help needed moving from lying on your back to sitting on the side of a flat bed without using bedrails?: None Help needed moving to and from a bed to a chair (including a wheelchair)?: A Little Help needed standing up from a chair using your arms (e.g., wheelchair or bedside chair)?: A Little Help needed to walk in hospital room?: A Lot Help needed climbing 3-5 steps with a railing? : Total 6 Click Score: 17     End of Session Equipment Utilized During Treatment: Oxygen Activity Tolerance: Patient limited by fatigue;Treatment limited secondary to medical complications (Comment) Patient left: in bed;with call bell/phone within reach Nurse Communication: Mobility status PT Visit Diagnosis: Unsteadiness on feet (R26.81);Difficulty in walking, not elsewhere classified (R26.2)    Time: [id number] PT Time Calculation (min) (ACUTE ONLY): 37 min   Charges:   PT Evaluation $PT Eval Moderate Complexity: 1 Mod PT Treatments $Gait Training: 8-22 mins        Tresa Endo PT Acute Rehabilitation Services Pager [phone] Office [phone]   Claretha Cooper 01/09/2020, 4:05 PM

## 2020-01-10 LAB — BASIC METABOLIC PANEL
Anion gap: 10 (ref 5–15)
BUN: 23 mg/dL — ABNORMAL HIGH (ref 6–20)
CO2: 25 mmol/L (ref 22–32)
Calcium: 8.9 mg/dL (ref 8.9–10.3)
Chloride: 107 mmol/L (ref 98–111)
Creatinine, Ser: 1.32 mg/dL — ABNORMAL HIGH (ref 0.44–1.00)
GFR calc Af Amer: 54 mL/min — ABNORMAL LOW (ref >60)
GFR calc non Af Amer: 47 mL/min — ABNORMAL LOW (ref >60)
Glucose, Bld: 92 mg/dL (ref 70–99)
Potassium: 4 mmol/L (ref 3.5–5.1)
Sodium: 142 mmol/L (ref 135–145)

## 2020-01-10 LAB — CBC
HCT: 31.2 % — ABNORMAL LOW (ref 36.0–46.0)
Hemoglobin: 9 g/dL — ABNORMAL LOW (ref 12.0–15.0)
MCH: 26.9 pg (ref 26.0–34.0)
MCHC: 28.8 g/dL — ABNORMAL LOW (ref 30.0–36.0)
MCV: 93.1 fL (ref 80.0–100.0)
Platelets: 231 K/uL (ref 150–400)
RBC: 3.35 MIL/uL — ABNORMAL LOW (ref 3.87–5.11)
RDW: 17.2 % — ABNORMAL HIGH (ref 11.5–15.5)
WBC: 9.8 K/uL (ref 4.0–10.5)
nRBC: 0 % (ref 0.0–0.2)

## 2020-01-10 MED ORDER — HEPARIN SOD (PORK) LOCK FLUSH 100 UNIT/ML IV SOLN
500.0000 [IU] | Freq: Once | INTRAVENOUS | Status: DC
  Filled 2020-01-10: qty 5

## 2020-01-10 NOTE — TOC Initial Note (Signed)
Transition of Care [facility]) - Initial/Assessment Note    Patient Details  Name: ZALEA GOVEA MRN: [id number] Date of Birth: [birth year]  Transition of [facility]) CM/SW Contact:    Jadwiga Faidley, Marjie Skiff, RN Phone Number: 01/10/2020, 3:36 PM  Clinical Narrative:                 Desaturation screen done on pt twice by nursing staff and pt does not qualify for home 02. RW and [place] set up.  Expected Discharge Plan: [place] Barriers to Discharge: No Barriers Identified   Patient Goals and CMS Choice Patient states their goals for this hospitalization and ongoing recovery are:: to get better CMS Medicare.gov Compare Post Acute Care list provided to:: Patient Choice offered to / list presented to : Patient  Expected Discharge Plan and Services Expected Discharge Plan: [place]   Discharge Planning Services: CM Consult Post Acute Care Choice: [place] arrangements for the past 2 months: Apartment Expected Discharge Date: 01/10/20               DME Arranged: [place] Rile rolling(Bariatric) DME Agency: [employer] Date DME Agency Contacted: 01/10/20 Time DME Agency Contacted: 1200 Representative spoke with at DME Agency: [place]: PT, OT [place] Agency: [place] Date [place]: 01/10/20 Time [place]: 1536    Prior Living Arrangements/Services Living arrangements for the past 2 months: [place] with:: Adult Children Patient language and need for interpreter reviewed:: Yes        Need for Family Participation in Patient Care: Yes (Comment) Care giver support system in place?: Yes (comment)      Activities of Daily Living Home Assistive Devices/Equipment: None ADL Screening (condition at time of admission) Patient's cognitive ability adequate to safely complete daily activities?: Yes Is the patient deaf or have difficulty hearing?: No Does the patient have difficulty seeing, even when  wearing glasses/contacts?: No Does the patient have difficulty concentrating, remembering, or making decisions?: No Patient able to express need for assistance with ADLs?: Yes Does the patient have difficulty dressing or bathing?: No Independently performs ADLs?: Yes (appropriate for developmental age) Does the patient have difficulty walking or climbing stairs?: Yes Weakness of Legs: Both Weakness of Arms/Hands: None  Permission Sought/Granted Permission sought to share information with : Chartered certified accountant granted to share information with : Yes, Verbal Permission Granted     Permission granted to share info w AGENCY: [place], [employer]        Emotional Assessment Appearance:: Appears older than stated age     Orientation: : Oriented to Self, Oriented to Place, Oriented to  Time, Oriented to Situation      Admission diagnosis:  Shortness of breath [R06.02] Acute respiratory failure with hypoxia ([place]) [J96.01] Patient Active Problem List   Diagnosis Date Noted  . OSA on CPAP   . Acute respiratory failure with hypoxia ([place]) 01/06/2020  . Chemotherapy-induced neutropenia ([place]) 10/06/2019  . Pulmonary hypertension, unspecified ([place]) 10/06/2019  . Morbid obesity with BMI of 40.0-44.9, adult ([place]) 08/29/2019  . Vaginal trichomoniasis 08/29/2019  . Blood in urine 08/29/2019  . Candidiasis of skin 08/29/2019  . Disorder of vulva 08/29/2019  . Herpes simplex 08/29/2019  . Lichen sclerosus et atrophicus 08/29/2019  . Menopausal problem 08/29/2019  . Menopausal syndrome 08/29/2019  . Spasm of back muscles 08/29/2019  . Vaginitis 08/29/2019  . Vaginal irritation 08/29/2019  . Port-A-Cath in place 11/03/2018  . Counseling  regarding advance care planning and goals of care 09/06/2018  . Colon cancer metastasized to liver ([place])   . Hypokalemia 09/01/2018  . Aspiration pneumonia ([place]) 08/30/2018  . Subcapsular hematoma of liver 08/27/2018  . Obesity, Class  III, BMI 40-49.9 (morbid obesity) ([place]) 08/27/2018  . Metastatic carcinoma to liver ([place]) 08/27/2018  . B12 deficiency anemia 08/27/2018  . Dizziness   . Acute blood loss anemia   . Thyroid mass 08/10/2018  . Epistaxis 08/10/2018  . Iron deficiency anemia 04/28/2018  . Normocytic anemia 04/09/2018  . Chest pain 04/09/2018  . Anxiety   . Asthma   . Hypertension   . Migraine    PCP:  Nolene Ebbs, MD Pharmacy:   [place], [place] [address] Phone: [phone] Fax: [place], [place] - [address] [place] [address] Phone: [phone] Fax: [place], [place] [address] Phone: [phone] Fax: [phone]     Social Determinants of Health (SDOH) Interventions    Readmission Risk Interventions Readmission Risk Prevention Plan 01/10/2020  Transportation Screening Complete  PCP or Specialist Appt within 3-5 Days Complete  HRI or [place] Complete  Social Work Consult for [place] Planning/Counseling Complete  Palliative Care Screening Not Applicable  Medication Review Press photographer) Complete  Some recent data might be hidden

## 2020-01-10 NOTE — Progress Notes (Signed)
Occupational Therapy Evaluation Patient Details Name: Margaret [name] MRN: [id number] DOB: Nov 03, 1970 Today's Date: 01/10/2020    History of Present Illness Patient is a 49 year old female with history of colon CA with liver metastasis, receiving chemotherapy,anxiety, hypertension, chronic pain, OSA on CPAP presented 01/06/20 with shortness of breath, fatigue, pooor oral intake, dyspnea, O2 sats 70s with ambulation in ED, tachycardiac up to 120, BP in 90s.  CT angiogram chest was negative for PE but moderate atelectasis in the right lower lobe. H/O Covid. Hgb 7.7   Clinical Impression   Patient with functional deficits listed below impacting safety and independence with self care. Patient overall supervision level for functional ambulation with cane however with decreased balance, would benefit from walker. Patient reports she has a walker and tub bench at home however they came unassembled and she needs assistance putting them together. Patient desaturate to 88% after using bathroom on room air, quickly recovers seated edge of bed on 3L with min cues for pursed lip breathing.     Follow Up Recommendations  Home health OT;Supervision/Assistance - 24 hour    Equipment Recommendations  Other (comment)(pt has necessary equipment, needs assembly)       Precautions / Restrictions Precautions Precautions: Fall Precaution Comments: reports right knee goes out Restrictions Weight Bearing Restrictions: No      Mobility Bed Mobility Overal bed mobility: Modified Independent             General bed mobility comments: extra time and effort  Transfers Overall transfer level: Needs assistance Equipment used: Straight cane Transfers: Sit to/from Stand Sit to Stand: Supervision         General transfer comment: increased time and effort    Balance Overall balance assessment: History of Falls;Needs assistance Sitting-balance support: Feet supported;Bilateral upper extremity  supported Sitting balance-Leahy Scale: Good     Standing balance support: Single extremity supported;During functional activity Standing balance-Leahy Scale: Poor Standing balance comment: with ambulation                           ADL either performed or assessed with clinical judgement   ADL Overall ADL's : Needs assistance/impaired     Grooming: Supervision/safety;Oral care;Standing;Wash/dry face;Wash/dry hands   Upper Body Bathing: Set up;Sitting   Lower Body Bathing: Moderate assistance;Sit to/from stand   Upper Body Dressing : Set up;Sitting   Lower Body Dressing: Moderate assistance;Sitting/lateral leans;Sit to/from stand Lower Body Dressing Details (indicate cue type and reason): patient is able to slide on shoes Toilet Transfer: Supervision/safety;Ambulation;Regular Toilet(cane)   Toileting- Clothing Manipulation and Hygiene: Supervision/safety;Sitting/lateral lean;Sit to/from stand       Functional mobility during ADLs: Supervision/safety;Cane General ADL Comments: patient with decreased activity tolerance impacting safety with self care                  Pertinent Vitals/Pain Pain Assessment: Faces Faces Pain Scale: Hurts even more Pain Location: side and back Pain Descriptors / Indicators: Discomfort(intermittent) Pain Intervention(s): Monitored during session     Hand Dominance Right   Extremity/Trunk Assessment Upper Extremity Assessment Upper Extremity Assessment: Overall WFL for tasks assessed   Lower Extremity Assessment Lower Extremity Assessment: Defer to PT evaluation   Cervical / Trunk Assessment Cervical / Trunk Assessment: Normal   Communication Communication Communication: No difficulties   Cognition Arousal/Alertness: Awake/alert Behavior During Therapy: WFL for tasks assessed/performed Overall Cognitive Status: Within Functional Limits for tasks assessed  General  Comments  patient desat to 88% after using bathroom without O2, quickly increase to 100% on [place] expects to be discharged to:: Private residence Living Arrangements: Children Available Help at Discharge: Family;Available 24 hours/day;Personal care attendant Type of Home: Apartment Home Access: Level entry     Home Layout: One level     Bathroom Shower/Tub: Tub/shower unit         Home Equipment: Walker - 4 wheels;Cane - single point;Shower seat;Bedside commode   Additional Comments: pt has a 4 wheeled RW and a tub seat but  neither have benn assembled and patient does not have the means to assemble.      Prior Functioning/Environment Level of Independence: Needs assistance  Gait / Transfers Assistance Needed: with SPC ADL's / Homemaking Assistance Needed: pt reports son assists with sponge bath, care attendant comes in morning to help with dressing "whatever I need"            OT Problem List: Decreased activity tolerance;Impaired balance (sitting and/or standing);Decreased safety awareness;Obesity      OT Treatment/Interventions: Self-care/ADL training;Therapeutic exercise;Energy conservation;DME and/or AE instruction;Therapeutic activities;Patient/family education;Balance training    OT Goals(Current goals can be found in the care plan section) Acute Rehab OT Goals Patient Stated Goal: to go home OT Goal Formulation: With patient Time For Goal Achievement: 01/24/20 Potential to Achieve Goals: Good  OT Frequency: Min 2X/week    AM-PAC OT "6 Clicks" Daily Activity     Outcome Measure Help from another person eating meals?: None Help from another person taking care of personal grooming?: A Little Help from another person toileting, which includes using toliet, bedpan, or urinal?: A Little Help from another person bathing (including washing, rinsing, drying)?: A Lot Help from another person to put on and taking off regular upper  body clothing?: A Little Help from another person to put on and taking off regular lower body clothing?: A Lot 6 Click Score: 17   End of Session Equipment Utilized During Treatment: Oxygen;Other (comment)(cane)  Activity Tolerance: Patient limited by fatigue Patient left: in bed;with call bell/phone within reach  OT Visit Diagnosis: Other abnormalities of gait and mobility (R26.89);History of falling (Z91.81)                Time: [id number] OT Time Calculation (min): 27 min Charges:  OT General Charges $OT Visit: 1 Visit OT Evaluation $OT Eval Moderate Complexity: 1 Mod OT Treatments $Self Care/Home Management : 8-22 mins  Delbert Phenix OT Pager: [place] 01/10/2020, 11:01 AM

## 2020-01-10 NOTE — Progress Notes (Signed)
SATURATION QUALIFICATIONS: (This note is used to comply with regulatory documentation for home oxygen)  Patient Saturations on Room Air at Rest = 94%  Patient Saturations on Room Air while Ambulating = 95%  Patient Saturations on 2 Liters of oxygen while Ambulating = 100%  Please briefly explain why patient needs home oxygen:pt may not need oxygen since sat is above 90%

## 2020-01-10 NOTE — Progress Notes (Signed)
SATURATION QUALIFICATIONS: (This note is used to comply with regulatory documentation for home oxygen)  Patient Saturations on Room Air at Rest =97*%  Patient Saturations on Room Air while Ambulating = 98%  Patient Saturations on 2 Liters of oxygen while Ambulating =100%  Please briefly explain why patient needs home oxygen: 

## 2020-01-10 NOTE — Discharge Summary (Signed)
Physician Discharge Summary  Margaret [name] [id number] DOB: March 31, 1971 DOA: 01/06/2020  PCP: Nolene Ebbs, MD  Admit date: 01/06/2020 Discharge date: 01/10/2020  Admitted From: Home Disposition: Home  Recommendations for Outpatient Follow-up:  1. Follow up with PCP in 1-2 weeks  Home Health: PT/OT Equipment/Devices: Oxygen, 2 L via nasal cannula  Discharge Condition: Stable CODE STATUS: Full code Diet recommendation: Cardiac/heart healthy diet; low-salt  History of present illness: Margaret [name] is a 49 year old female with history of colon CA with liver metastasis, anxiety, hypertension, chronic pain, OSA on CPAP presented with shortness of breath, fatigue, 3 to 4 days of poor oral intake, dyspnea.  Patient reported similar symptoms previously attributed to symptomatic anemia, no melena or hematochezia.  She has a history of asthma but no recent coughing or wheezing.  In ED, afebrile, O2 sats 70s with ambulation in ED, tachycardiac up to 120, BP in 90s.  CT angiogram chest was negative for PE but moderate atelectasis in the right lower lobe creatinine 1.1.  Patient received normal saline fluid bolus and was placed on IV fluids. COVID-19 test, influenza panel negative  Hospital course:  Acute respiratory failure with hypoxia Patient presenting with shortness of breath, fatigue with associated poor oral intake.  Started to be hypoxic with O2 saturation in the 70s with mild exertion.  History of asthma, OSA and likely obesity hypoventilation syndrome.  Bone density negative x3.  CT angiogram chest negative for pulmonary embolism but does note atelectasis right lower lobe.  2D echocardiogram notable for EF of [id number], grade 1 diastolic dysfunction, normal right ventricular systolic function, moderately elevated pulmonary artery pressure.  No pulmonary edema on chest x-ray.  No signs of infectious process.  PT noted desaturation to 83% on room air.  Etiology likely secondary to her  underlying body habitus with likely obesity hypoventilation syndrome.  Will discharge home with supplemental oxygen 2 L per nasal cannula.  Recommend that she monitors her daily weights and adheres to a heart healthy/sodium restricted diet.  Anemia of chronic disease, malignancy Hx colon cancer with metastases Hemoglobin 7.7, transfuse 1 unit PRBC on 02/07/2020.  Baseline hemoglobin between 8-9.  Hemoglobin at discharge 9.9.  Continue chemotherapy.  Continue outpatient follow-up with oncology.  Essential hypertension: Continue home lisinopril/HCTZ  Asthma: Continue home ICS/LABA, Singulair, and as needed albuterol.  Anxiety: Continue Valium as needed  Chronic pain syndrome: Continue home regimen  OSA: Continue nocturnal CPAP  Morbid obesity Body mass index is 54.64 kg/m.  Discussed with patient needs for aggressive weight loss measures/lifestyle changes as this contributes to all facets of her care.   Discharge Diagnoses:  Principal Problem:   Acute respiratory failure with hypoxia ([place]) Active Problems:   Asthma   Hypertension   Normocytic anemia   Colon cancer metastasized to liver [facility])    Discharge Instructions  Discharge Instructions    (HEART FAILURE PATIENTS) Call MD:  Anytime you have any of the following symptoms: 1) 3 pound weight gain in 24 hours or 5 pounds in 1 week 2) shortness of breath, with or without a dry hacking cough 3) swelling in the hands, feet or stomach 4) if you have to sleep on extra pillows at night in order to breathe.   Complete by: As directed    Call MD for:   Complete by: As directed    Call MD for:  difficulty breathing, headache or visual disturbances   Complete by: As directed    Call MD for:  extreme fatigue  Complete by: As directed    Call MD for:  persistant dizziness or light-headedness   Complete by: As directed    Call MD for:  persistant nausea and vomiting   Complete by: As directed    Call MD for:  severe uncontrolled  pain   Complete by: As directed    Call MD for:  temperature >100.4   Complete by: As directed    Diet - low sodium heart healthy   Complete by: As directed    Increase activity slowly   Complete by: As directed      Allergies as of 01/10/2020      Reactions   Latex Hives, Shortness Of Breath   Tape Rash   Tylenol With Codeine #3 [acetaminophen-codeine] Itching, Rash      Medication List    STOP taking these medications   benzonatate 200 MG capsule Commonly known as: TESSALON   dexamethasone 2 MG tablet Commonly known as: DECADRON     TAKE these medications   albuterol 108 (90 Base) MCG/ACT inhaler Commonly known as: ProAir HFA Inhale 2 puffs into the lungs every 4 (four) hours as needed for wheezing or shortness of breath.   ascorbic acid 500 MG tablet Commonly known as: VITAMIN C Take 500 mg by mouth daily.   B-12 1000 MCG Subl Place 1,000 mcg under the tongue daily.   Boric Acid 4 % Soln Place 1 capsule vaginally every Friday.   budesonide-formoterol 160-4.5 MCG/ACT inhaler Commonly known as: SYMBICORT Inhale 2 puffs into the lungs 2 (two) times daily as needed (sob/wheezing). What changed: Another medication with the same name was removed. Continue taking this medication, and follow the directions you see here.   capecitabine 500 MG tablet Commonly known as: XELODA Take 4 tablets (2,000 mg total) by mouth 2 (two) times daily after a meal for 14 days. Take 14 days on, 7 days off, repeat every 21 days.   cetirizine 10 MG tablet Commonly known as: ZYRTEC Take 1 tablet (10 mg total) by mouth daily. What changed:   when to take this  reasons to take this   clobetasol ointment 0.05 % Commonly known as: TEMOVATE Apply 1 application topically 2 (two) times daily as needed (lichen sclerosis). Use for Lichen Sclerosis   diazepam 5 MG tablet Commonly known as: VALIUM Take 1 tablet (5 mg total) by mouth daily as needed for anxiety (panic attacks).    diclofenac sodium 1 % Gel Commonly known as: VOLTAREN Apply 2 g topically 4 (four) times daily as needed (for back/knee pain.).   dicyclomine 20 MG tablet Commonly known as: BENTYL Take 1 tablet (20 mg total) by mouth 2 (two) times daily. What changed: when to take this   dronabinol 5 MG capsule Commonly known as: MARINOL Take 1 capsule (5 mg total) by mouth 2 (two) times daily before a meal.   ergocalciferol 1.25 MG (50000 UT) capsule Commonly known as: VITAMIN D2 Take 50,000 Units by mouth every Monday.   lisinopril-hydrochlorothiazide 20-12.5 MG tablet Commonly known as: ZESTORETIC Take 1 tablet by mouth daily.   loperamide 2 MG tablet Commonly known as: Imodium A-D Take 1 tablet (2 mg total) by mouth 4 (four) times daily as needed. Take 2 at diarrhea onset , then 1 every 2hr until 12hrs with no BM. May take 2 every 4hrs at night. If diarrhea recurs repeat.   magic mouthwash Soln Take 5 mLs by mouth 4 (four) times daily as needed. What changed: reasons to take  this   montelukast 10 MG tablet Commonly known as: SINGULAIR Take 1 tablet (10 mg total) by mouth at bedtime.   nystatin-triamcinolone cream Commonly known as: MYCOLOG II Apply 1 application topically 2 (two) times daily as needed (irritation). Use in skin folds and under breast   ondansetron 8 MG tablet Commonly known as: Zofran Take 1 tablet (8 mg total) by mouth every 8 (eight) hours as needed for nausea or vomiting.   oxyCODONE 5 MG immediate release tablet Commonly known as: Oxy IR/ROXICODONE Take 1-2 tablets (5-10 mg total) by mouth every 4 (four) hours as needed for moderate pain or severe pain (cancer related pain).   oxyCODONE-acetaminophen 5-325 MG tablet Commonly known as: PERCOCET/ROXICET Take 1-2 tablets by mouth every 4 (four) hours as needed for moderate pain or severe pain.   pantoprazole 40 MG tablet Commonly known as: PROTONIX Take 1 tablet (40 mg total) by mouth daily before  breakfast.   Pazeo 0.7 % Soln Generic drug: Olopatadine HCl Apply 1 drop to eye daily. What changed:   when to take this  reasons to take this   pimecrolimus 1 % cream Commonly known as: ELIDEL Apply 1 application topically 2 (two) times daily. What changed:   when to take this  reasons to take this   prochlorperazine 10 MG tablet Commonly known as: COMPAZINE Take 1 tablet (10 mg total) by mouth every 6 (six) hours as needed (NAUSEA).   promethazine 25 MG tablet Commonly known as: PHENERGAN Take 1 tablet (25 mg total) by mouth every 6 (six) hours as needed for nausea.   saccharomyces boulardii 250 MG capsule Commonly known as: FLORASTOR Take 1 capsule (250 mg total) by mouth 2 (two) times daily.   senna-docusate 8.6-50 MG tablet Commonly known as: Senna S Take 2 tablets by mouth at bedtime. What changed:   when to take this  reasons to take this   TobraDex ophthalmic solution Generic drug: tobramycin-dexamethasone Place 1 drop into both eyes every 6 (six) hours as needed (dry eyes).   triamcinolone 55 MCG/ACT Aero nasal inhaler Commonly known as: NASACORT Place 2 sprays into the nose daily as needed (for allergies).   valACYclovir 500 MG tablet Commonly known as: VALTREX Take 500 mg by mouth 2 (two) times daily as needed (for cold sores).            Durable Medical Equipment  (From admission, onward)         Start     Ordered   01/10/20 0910  For home use only DME oxygen  Once    Question Answer Comment  Length of Need 12 Months   Mode or (Route) Nasal cannula   Liters per Minute 2   Frequency Continuous (stationary and portable oxygen unit needed)   Oxygen delivery system Gas      01/10/20 0910   01/09/20 1614  For home use only DME Walker rolling  Once    Comments: Bariatric Rolling walker  Question Answer Comment  Walker: With 5 Inch Wheels   Patient needs a walker to treat with the following condition Gait instability      01/09/20  1614         Follow-up Information    Nolene Ebbs, MD. Schedule an appointment as soon as possible for a visit in 1 week(s).   Specialty: Internal Medicine Contact information: [place] [address] [phone]          Allergies  Allergen Reactions  . Latex  Hives and Shortness Of Breath  . Tape Rash  . Tylenol With Codeine #3 [Acetaminophen-Codeine] Itching and Rash    Consultations:  none   Procedures/Studies: CT Angio Chest PE W/Cm &/Or Wo Cm  Result Date: 01/06/2020 CLINICAL DATA:  Shortness of breath. History of metastatic colon cancer. EXAM: CT ANGIOGRAPHY CHEST WITH CONTRAST TECHNIQUE: Multidetector CT imaging of the chest was performed using the standard protocol during bolus administration of intravenous contrast. Multiplanar CT image reconstructions and MIPs were obtained to evaluate the vascular anatomy. CONTRAST:  19mL OMNIPAQUE IOHEXOL 350 MG/ML SOLN COMPARISON:  10/27/2019.  09/12/2019. FINDINGS: Cardiovascular: Pulmonary arterial opacification is moderate. No visible pulmonary emboli. Study affectively excludes large central emboli. Small peripheral emboli might be inapparent. No evidence of aortic pathology. Heart size is normal. Mediastinum/Nodes: No mass or adenopathy. Lungs/Pleura: No evidence of pulmonary metastatic disease. There is atelectasis in the right lower lobe which could relate to shortness of breath. Left lung as well aerated. No pleural effusion. Upper Abdomen: Hepatic metastatic disease not optimally evaluated using this technique. Hiatal hernia. Musculoskeletal: Negative Review of the MIP images confirms the above findings. IMPRESSION: Moderate atelectasis in the right lower lobe could relate 2 shortness of breath. No visible pulmonary emboli. Pulmonary opacification is moderate. The study excludes large and central emboli. Small peripheral emboli might be inapparent given the degree of vascular opacification. No specific  positive evidence for that however. Electronically Signed   By: Nelson Chimes M.D.   On: 01/06/2020 18:43   ECHOCARDIOGRAM COMPLETE  Result Date: 01/08/2020    ECHOCARDIOGRAM REPORT   Patient Name:   Margaret [name] Date of Exam: 01/08/2020 Medical Rec #:  [id number]          Height:       68.0 in Accession #:    [id number]         Weight:       359.3 lb Date of Birth:  12/24/1970         BSA:          2.622 m Patient Age:    45 years           BP:           126/82 mmHg Patient Gender: F                  HR:           92 bpm. Exam Location:  Inpatient Procedure: 2D Echo Indications:    dyspnea  History:        Patient has prior history of Echocardiogram examinations, most                 recent 09/03/2018. Risk Factors:Hypertension and Former Smoker.  Sonographer:    Jannett Celestine RDCS (AE) Referring Phys: [id number] RIPUDEEP K RAI IMPRESSIONS  1. Left ventricular ejection fraction, by estimation, is 60 to 65%. The left ventricle has normal function. The left ventricle has no regional wall motion abnormalities. Left ventricular diastolic parameters are consistent with Grade I diastolic dysfunction (impaired relaxation).  2. Right ventricular systolic function is normal. The right ventricular size is mildly enlarged. There is moderately elevated pulmonary artery systolic pressure. The estimated right ventricular systolic pressure is [id number] mmHg.  3. The mitral valve is normal in structure. Trivial mitral valve regurgitation. No evidence of mitral stenosis.  4. Tricuspid valve regurgitation is mild to moderate.  5. The aortic valve is normal in structure. Aortic valve regurgitation is not visualized. No aortic  stenosis is present.  6. The inferior vena cava is normal in size with greater than 50% respiratory variability, suggesting right atrial pressure of 3 mmHg. Comparison(s): No significant change from prior study. Prior images reviewed side by side. Estimated PA pressure is reduced when compared to prior study.  FINDINGS  Left Ventricle: Left ventricular ejection fraction, by estimation, is 60 to 65%. The left ventricle has normal function. The left ventricle has no regional wall motion abnormalities. The left ventricular internal cavity size was normal in size. There is  no left ventricular hypertrophy. Left ventricular diastolic parameters are consistent with Grade I diastolic dysfunction (impaired relaxation). Right Ventricle: The right ventricular size is mildly enlarged. No increase in right ventricular wall thickness. Right ventricular systolic function is normal. There is moderately elevated pulmonary artery systolic pressure. The tricuspid regurgitant velocity is 3.11 m/s, and with an assumed right atrial pressure of 3 mmHg, the estimated right ventricular systolic pressure is [id number] mmHg. Left Atrium: Left atrial size was normal in size. Right Atrium: Right atrial size was normal in size. Pericardium: There is no evidence of pericardial effusion. Mitral Valve: The mitral valve is normal in structure. Normal mobility of the mitral valve leaflets. Trivial mitral valve regurgitation. No evidence of mitral valve stenosis. Tricuspid Valve: The tricuspid valve is normal in structure. Tricuspid valve regurgitation is mild to moderate. No evidence of tricuspid stenosis. Aortic Valve: The aortic valve is normal in structure. Aortic valve regurgitation is not visualized. No aortic stenosis is present. Pulmonic Valve: The pulmonic valve was normal in structure. Pulmonic valve regurgitation is not visualized. No evidence of pulmonic stenosis. Aorta: The aortic root is normal in size and structure. Venous: The inferior vena cava is normal in size with greater than 50% respiratory variability, suggesting right atrial pressure of 3 mmHg. IAS/Shunts: No atrial level shunt detected by color flow Doppler.  LEFT VENTRICLE PLAX 2D LVIDd:         5.20 cm  Diastology LVIDs:         2.60 cm  LV e' lateral:   11.30 cm/s LV PW:         1.30  cm  LV E/e' lateral: 5.7 LV IVS:        0.90 cm  LV e' medial:    7.29 cm/s LVOT diam:     2.40 cm  LV E/e' medial:  8.9 LV SV:         60 LV SV Index:   23 LVOT Area:     4.52 cm  LEFT ATRIUM         Index LA diam:    4.00 cm 1.53 cm/m  AORTIC VALVE LVOT Vmax:   79.30 cm/s LVOT Vmean:  63.300 cm/s LVOT VTI:    0.132 m  AORTA Ao Root diam: 3.00 cm MITRAL VALVE               TRICUSPID VALVE MV Area (PHT): 3.54 cm    TR Peak grad:   38.7 mmHg MV Decel Time: 214 msec    TR Vmax:        311.00 cm/s MV E velocity: 64.90 cm/s MV A velocity: 74.70 cm/s  SHUNTS MV E/A ratio:  0.87        Systemic VTI:  0.13 m                            Systemic Diam: 2.40 cm Candee Furbish MD Electronically signed  by Candee Furbish MD Signature Date/Time: 01/08/2020/3:46:53 PM    Final       Subjective: Patient seen and examined bedside, resting comfortably.  No complaints this morning.  Ready for discharge home.  Denies headache, no fever/chills/night sweats, no nausea/vomiting/diarrhea, no chest pain, no palpitations, no shortness of breath, no wheezing, no abdominal pain, no weakness, no cough/congestion.  No acute events overnight per nursing staff.  Discharge Exam: Vitals:   01/09/20 2133 01/10/20 0547  BP:  125/76  Pulse: 78 77  Resp: 18 20  Temp:  98 F (36.7 C)  SpO2: 100% 100%   Vitals:   01/09/20 1215 01/09/20 2115 01/09/20 2133 01/10/20 0547  BP: 129/78 132/90  125/76  Pulse: 73 79 78 77  Resp: 18 20 18 20   Temp: 98.3 F (36.8 C) 98.1 F (36.7 C)  98 F (36.7 C)  TempSrc: Oral Oral  Oral  SpO2: 99% 100% 100% 100%  Weight:      Height:        General: Pt is alert, awake, not in acute distress, obese Cardiovascular: RRR, S1/S2 +, no rubs, no gallops Respiratory: Sounds slightly diminished right base, otherwise clear, no wheezes/crackles, normal respiratory effort, on 2 L nasal cannula Abdominal: Soft, NT, ND, bowel sounds + Extremities: no edema, no cyanosis    The results of significant  diagnostics from this hospitalization (including imaging, microbiology, ancillary and laboratory) are listed below for reference.     Microbiology: Recent Results (from the past 240 hour(s))  Respiratory Panel by RT PCR (Flu A&B, Covid) - Nasopharyngeal Swab     Status: None   Collection Time: 01/06/20  9:35 PM   Specimen: Nasopharyngeal Swab  Result Value Ref Range Status   SARS Coronavirus 2 by RT PCR NEGATIVE NEGATIVE Final    Comment: (NOTE) SARS-CoV-2 target nucleic acids are NOT DETECTED. The SARS-CoV-2 RNA is generally detectable in upper respiratoy specimens during the acute phase of infection. The lowest concentration of SARS-CoV-2 viral copies this assay can detect is 131 copies/mL. A negative result does not preclude SARS-Cov-2 infection and should not be used as the sole basis for treatment or other patient management decisions. A negative result may occur with  improper specimen collection/handling, submission of specimen other than nasopharyngeal swab, presence of viral mutation(s) within the areas targeted by this assay, and inadequate number of viral copies (<131 copies/mL). A negative result must be combined with clinical observations, patient history, and epidemiological information. The expected result is Negative. Fact Sheet for Patients:  PinkCheek.be Fact Sheet for Healthcare Providers:  GravelBags.it This test is not yet ap proved or cleared by the [place] FDA and  has been authorized for detection and/or diagnosis of SARS-CoV-2 by FDA under an Emergency Use Authorization (EUA). This EUA will remain  in effect (meaning this test can be used) for the duration of the COVID-19 declaration under Section 564(b)(1) of the Act, 21 U.S.C. section 360bbb-3(b)(1), unless the authorization is terminated or revoked sooner.    Influenza A by PCR NEGATIVE NEGATIVE Final   Influenza B by PCR NEGATIVE NEGATIVE  Final    Comment: (NOTE) The Xpert Xpress SARS-CoV-2/FLU/RSV assay is intended as an aid in  the diagnosis of influenza from Nasopharyngeal swab specimens and  should not be used as a sole basis for treatment. Nasal washings and  aspirates are unacceptable for Xpert Xpress SARS-CoV-2/FLU/RSV  testing. Fact Sheet for Patients: PinkCheek.be Fact Sheet for Healthcare Providers: GravelBags.it This test is not yet approved or  cleared by the [place] and  has been authorized for detection and/or diagnosis of SARS-CoV-2 by  FDA under an Emergency Use Authorization (EUA). This EUA will remain  in effect (meaning this test can be used) for the duration of the  Covid-19 declaration under Section 564(b)(1) of the Act, 21  U.S.C. section 360bbb-3(b)(1), unless the authorization is  terminated or revoked. Performed at [facility], [place] [address], [place], [place] [address]      Labs: BNP (last 3 results) Recent Labs    01/09/20 1649  BNP [id number]   Basic Metabolic Panel: Recent Labs  Lab 01/06/20 1127 01/07/20 0600 01/08/20 0500 01/09/20 0547 01/10/20 0552  NA 147* 143 142 138 142  K 3.7 3.5 3.9 4.0 4.0  CL 110 107 109 107 107  CO2 26 26 26 25 25   GLUCOSE 105* 97 97 90 92  BUN 14 18 19 17  23*  CREATININE 1.13* 1.15* 1.14* 1.20* 1.32*  CALCIUM 9.4 8.6* 8.2* 8.5* 8.9   Liver Function Tests: Recent Labs  Lab 01/06/20 1127  AST 27  ALT 13  ALKPHOS 111  BILITOT 0.5  PROT 7.6  ALBUMIN 2.4*   No results for input(s): LIPASE, AMYLASE in the last 168 hours. No results for input(s): AMMONIA in the last 168 hours. CBC: Recent Labs  Lab 01/06/20 1127 01/07/20 0600 01/08/20 0500 01/09/20 0547 01/10/20 0552  WBC 10.3 10.1 8.6 9.8 9.8  NEUTROABS 6.5  --   --   --   --   HGB 9.1* 8.0* 7.7* 8.6* 9.0*  HCT 31.8* 27.5* 26.5* 29.6* 31.2*  MCV 94.1 95.8 96.4 94.3 93.1  PLT 278 232 222 230 231    Cardiac Enzymes: No results for input(s): CKTOTAL, CKMB, CKMBINDEX, TROPONINI in the last 168 hours. BNP: Invalid input(s): POCBNP CBG: No results for input(s): GLUCAP in the last 168 hours. D-Dimer No results for input(s): DDIMER in the last 72 hours. Hgb A1c No results for input(s): HGBA1C in the last 72 hours. Lipid Profile No results for input(s): CHOL, HDL, LDLCALC, TRIG, CHOLHDL, LDLDIRECT in the last 72 hours. Thyroid function studies No results for input(s): TSH, T4TOTAL, T3FREE, THYROIDAB in the last 72 hours.  Invalid input(s): FREET3 Anemia work up No results for input(s): VITAMINB12, FOLATE, FERRITIN, TIBC, IRON, RETICCTPCT in the last 72 hours. Urinalysis    Component Value Date/Time   COLORURINE YELLOW 07/10/2019 1202   APPEARANCEUR CLEAR 07/10/2019 1202   LABSPEC 1.018 07/10/2019 1202   PHURINE 5.0 07/10/2019 1202   GLUCOSEU NEGATIVE 07/10/2019 1202   HGBUR NEGATIVE 07/10/2019 1202   BILIRUBINUR NEGATIVE 07/10/2019 1202   KETONESUR NEGATIVE 07/10/2019 1202   PROTEINUR NEGATIVE 12/07/2019 1412   UROBILINOGEN 0.2 01/06/2011 1755   NITRITE NEGATIVE 07/10/2019 1202   LEUKOCYTESUR NEGATIVE 07/10/2019 1202   Sepsis Labs Invalid input(s): PROCALCITONIN,  WBC,  LACTICIDVEN Microbiology Recent Results (from the past 240 hour(s))  Respiratory Panel by RT PCR (Flu A&B, Covid) - Nasopharyngeal Swab     Status: None   Collection Time: 01/06/20  9:35 PM   Specimen: Nasopharyngeal Swab  Result Value Ref Range Status   SARS Coronavirus 2 by RT PCR NEGATIVE NEGATIVE Final    Comment: (NOTE) SARS-CoV-2 target nucleic acids are NOT DETECTED. The SARS-CoV-2 RNA is generally detectable in upper respiratoy specimens during the acute phase of infection. The lowest concentration of SARS-CoV-2 viral copies this assay can detect is 131 copies/mL. A negative result does not preclude SARS-Cov-2 infection and should  not be used as the sole basis for treatment or other patient  management decisions. A negative result may occur with  improper specimen collection/handling, submission of specimen other than nasopharyngeal swab, presence of viral mutation(s) within the areas targeted by this assay, and inadequate number of viral copies (<131 copies/mL). A negative result must be combined with clinical observations, patient history, and epidemiological information. The expected result is Negative. Fact Sheet for Patients:  PinkCheek.be Fact Sheet for Healthcare Providers:  GravelBags.it This test is not yet ap proved or cleared by the [place] FDA and  has been authorized for detection and/or diagnosis of SARS-CoV-2 by FDA under an Emergency Use Authorization (EUA). This EUA will remain  in effect (meaning this test can be used) for the duration of the COVID-19 declaration under Section 564(b)(1) of the Act, 21 U.S.C. section 360bbb-3(b)(1), unless the authorization is terminated or revoked sooner.    Influenza A by PCR NEGATIVE NEGATIVE Final   Influenza B by PCR NEGATIVE NEGATIVE Final    Comment: (NOTE) The Xpert Xpress SARS-CoV-2/FLU/RSV assay is intended as an aid in  the diagnosis of influenza from Nasopharyngeal swab specimens and  should not be used as a sole basis for treatment. Nasal washings and  aspirates are unacceptable for Xpert Xpress SARS-CoV-2/FLU/RSV  testing. Fact Sheet for Patients: PinkCheek.be Fact Sheet for Healthcare Providers: GravelBags.it This test is not yet approved or cleared by the [place] FDA and  has been authorized for detection and/or diagnosis of SARS-CoV-2 by  FDA under an Emergency Use Authorization (EUA). This EUA will remain  in effect (meaning this test can be used) for the duration of the  Covid-19 declaration under Section 564(b)(1) of the Act, 21  U.S.C. section 360bbb-3(b)(1), unless the  authorization is  terminated or revoked. Performed at [facility], [place] [address] [place], [place] [address]      Time coordinating discharge: Over 30 minutes  SIGNED:   Aerika Groll J [place] DO  [facility] 01/10/2020, 11:28 AM

## 2020-01-10 NOTE — Progress Notes (Signed)
Physical Therapy Treatment Patient Details Name: Margaret [name] MRN: [id number] DOB: Jan 08, 1971 Today's Date: 01/10/2020    History of Present Illness Patient is a 49 year old female with history of colon CA with liver metastasis, receiving chemotherapy,anxiety, hypertension, chronic pain, OSA on CPAP presented 01/06/20 with shortness of breath, fatigue, pooor oral intake, dyspnea, O2 sats 70s with ambulation in ED, tachycardiac up to 120, BP in 90s.  CT angiogram chest was negative for PE but moderate atelectasis in the right lower lobe. H/O Covid. Hgb 7.7    PT Comments    Upon PT arrival, patient ambulating in hall with [facility] with CNA. Patient noted to be very sSOB. Provided a seat to sit and rest. Patient then ambulated x 40' using rollator, then 20' x 2 with 2 wheeled RW. The RW is much safer device for patient. Again, patient has a rollator but it is not assembled. Recommend HHPT and bari 2 wheeled RW.   Follow Up Recommendations  Home health PT     Equipment Recommendations  Rolling walker with 5" wheels(Pt's rollator is not assembled)    Recommendations for Other Services       Precautions / Restrictions Precautions Precautions: Fall Precaution Comments: monitor sats Restrictions Weight Bearing Restrictions: No    Mobility  Bed Mobility Overal bed mobility: Modified Independent             General bed mobility comments: extra time and effort  Transfers Overall transfer level: Needs assistance Equipment used: Straight cane;4-wheeled walker;Rolling walker (2 wheeled) Transfers: Sit to/from Stand Sit to Stand: Min guard         General transfer comment: patient very SOB after CNA had ambulated the patient  into hall using SPC. Patient given rollator to sit down on to rest.  Ambulation/Gait Ambulation/Gait assistance: Min assist;Min guard Gait Distance (Feet): 40 Feet Assistive device: Rolling walker (2 wheeled);4-wheeled walker Gait Pattern/deviations:  Step-to pattern;Wide base of support Gait velocity: decr   General Gait Details: Ambulated with rollator with much more steady gait and less SOB.  Patient ambulated x 20' x 2 with 2 wheeled bari walker and did well with steady gait and less SOB   Stairs             Wheelchair Mobility    Modified Rankin (Stroke Patients Only)       Balance Overall balance assessment: History of Falls;Needs assistance Sitting-balance support: Feet supported;Bilateral upper extremity supported Sitting balance-Leahy Scale: Good     Standing balance support: Single extremity supported;During functional activity Standing balance-Leahy Scale: Poor Standing balance comment: with ambulation                            Cognition Arousal/Alertness: Awake/alert Behavior During Therapy: Anxious Overall Cognitive Status: Within Functional Limits for tasks assessed                                        Exercises      General Comments General comments (skin integrity, edema, etc.): patient desat to 88% after using bathroom without O2, quickly increase to 100% on 3L      Pertinent Vitals/Pain Pain Assessment: Faces Faces Pain Scale: Hurts even more Pain Location: side and back Pain Descriptors / Indicators: Discomfort Pain Intervention(s): Monitored during session    Home Living Family/patient expects to be discharged to:: Private residence Living Arrangements: Children  Available Help at Discharge: Family;Available 24 hours/day;Personal care attendant Type of Home: Apartment Home Access: Level entry   Home Layout: One level Home Equipment: Walker - 4 wheels;Cane - single point;Shower seat;Bedside commode Additional Comments: pt has a 4 wheeled RW and a tub seat but  neither have benn assembled and patient does not have the means to assemble.    Prior Function Level of Independence: Needs assistance  Gait / Transfers Assistance Needed: with [facility] ADL's /  Homemaking Assistance Needed: pt reports son assists with sponge bath, care attendant comes in morning to help with dressing "whatever I need"     PT Goals (current goals can now be found in the care plan section) Acute Rehab PT Goals Patient Stated Goal: to go home Progress towards PT goals: Progressing toward goals    Frequency    Min 3X/week      PT Plan Current plan remains appropriate    Co-evaluation              AM-PAC PT "6 Clicks" Mobility   Outcome Measure  Help needed turning from your back to your side while in a flat bed without using bedrails?: None Help needed moving from lying on your back to sitting on the side of a flat bed without using bedrails?: None Help needed moving to and from a bed to a chair (including a wheelchair)?: A Little Help needed standing up from a chair using your arms (e.g., wheelchair or bedside chair)?: A Little Help needed to walk in hospital room?: A Little Help needed climbing 3-5 steps with a railing? : Total 6 Click Score: 18    End of Session Equipment Utilized During Treatment: Oxygen Activity Tolerance: Patient limited by fatigue;Treatment limited secondary to medical complications (Comment) Patient left: in bed;with call bell/phone within reach Nurse Communication: Mobility status PT Visit Diagnosis: Unsteadiness on feet (R26.81);Difficulty in walking, not elsewhere classified (R26.2)     Time: 1140-1200 PT Time Calculation (min) (ACUTE ONLY): 20 min  Charges:  $Gait Training: 8-22 mins                     Tresa Endo PT Acute Rehabilitation Services Pager [phone] Office [phone]    Claretha Cooper 01/10/2020, 12:42 PM

## 2020-01-25 ENCOUNTER — Encounter: Payer: Self-pay | Admitting: Hematology

## 2020-01-25 ENCOUNTER — Telehealth: Payer: Self-pay | Admitting: Hematology

## 2020-01-31 ENCOUNTER — Telehealth: Payer: Self-pay | Admitting: *Deleted

## 2020-01-31 NOTE — Telephone Encounter (Signed)
Dr.Kale response to patient's MyChart message requesting to resume treatments: Dr.Kale would like to see patient next week with labs prior to resuming chemotherapy. Patient in agreement. Schedule message sent to schedule patient on 5/26.

## 2020-02-01 ENCOUNTER — Telehealth: Payer: Self-pay | Admitting: Hematology

## 2020-02-01 NOTE — Telephone Encounter (Signed)
Scheduled appt per 5/18 sch message - unable to reach pt - left message with appt date and time

## 2020-02-08 ENCOUNTER — Encounter: Payer: Self-pay | Admitting: Hematology

## 2020-02-08 ENCOUNTER — Inpatient Hospital Stay: Payer: Medicaid Other | Attending: Hematology | Admitting: Hematology

## 2020-02-08 ENCOUNTER — Telehealth: Payer: Self-pay | Admitting: *Deleted

## 2020-02-08 ENCOUNTER — Inpatient Hospital Stay: Payer: Medicaid Other

## 2020-02-08 DIAGNOSIS — Z7189 Other specified counseling: Secondary | ICD-10-CM | POA: Diagnosis not present

## 2020-02-08 DIAGNOSIS — C787 Secondary malignant neoplasm of liver and intrahepatic bile duct: Secondary | ICD-10-CM | POA: Diagnosis not present

## 2020-02-08 DIAGNOSIS — C189 Malignant neoplasm of colon, unspecified: Secondary | ICD-10-CM

## 2020-02-08 MED ORDER — LORAZEPAM 0.5 MG PO TABS: 0.5000 mg | ORAL_TABLET | Freq: Three times a day (TID) | ORAL | 0 refills | Status: AC

## 2020-02-08 NOTE — Progress Notes (Signed)
HEMATOLOGY/ONCOLOGY PHONE VISIT  NOTE  Date of Service: 02/08/2020  Patient Care Team: Nolene Ebbs, MD as PCP - General (Internal Medicine) Jackelyn Knife, MD as Rounding Team (Internal Medicine) Julianne Handler, NP as Nurse Practitioner (Hospice and Palliative Medicine)   CHIEF COMPLAINTS/PURPOSE OF CONSULTATION:   Continued mx of Metastatic Adenocarcinoma of colon   HISTORY OF PRESENTING ILLNESS:   Margaret [name] is a wonderful 49 y.o. female who has been referred to [place] by Dr. Nolene Ebbs for evaluation and management of Iron deficiency anemia. The pt reports that she is doing well overall.   The pt recently presented to the ED on 04/16/18 for right sided abdominal pain that was evaluated with a CT A/P which revealed liver and pancreatic cyst, recommended for outpatient MRI follow up. The pt was found to have a UTI, was treated with Rocephin and discharged with Keflex. Prior to this the pt was admitted on 04/09/18 for symptomatic anemia, presenting with HGB at 6.2, received 2 units PRBCs, one IV Iron infusion, and was encouraged to seek out GI as outpatient.  She has an appointment with [facility] on 05/19/18.   The pt reports that she is still feeling dizzy and light headed and denies feeling better after her recent blood transfusion. She notes that she dizziness presents with a feeling of warmness and that she begins hyperventilating, feeling nervous about her dizziness.  She notes that prior to her recent hospital stay, she never required a blood transfusion or IV iron replacement. She notes that as a child she had frequent nose bleeds, and was diagnosed with anemia. She also notes a history of ice picca.   She had a hysterectomy in 2012, and prior to this she had very heavy periods that began at age 80 and occurred almost constantly. She was on Depo and Mirena which did not help slow menstrual losses. Besides taking iron supplements while pregnant she has not taken PO iron  replacement as she reports not tolerating it very well while pregnant.   She denies concern for black stools or blood in the stools and has not had a colonoscopy or endoscopy before. The pt notes that she has not previously had a concern for stomach ulcers, but has acid reflux and takes Prevacid as needed for 7-8 years. She notes that she has not recently used Prevacid, for the last 3 months.  She is not taking vitamin replacements and denies any dietary restrictions.  The pt notes that she has been continuing to have lower right quadrant abdominal pain that radiates across her abdomen, presents for up to 45 minutes and occurs intermittently. She notes that her abdominal pain presented on 04/13/18.   The pt notes that was taking Ibuprofen 834m BID for 8 years to address her back pain related to her herniated disk. She stopped taking this 4 months ago.   Of note prior to the patient's visit today, pt has had CT A/P completed on 04/16/18 with results revealing No acute findings in the abdomen/pelvis. Least 4 low-density hepatic lesions, some of which may represent small cysts or hemangiomas, however there is a 19 mm lesion in the left lobe of the liver that is incompletely characterized. Recommend nonemergent hepatic protocol MRI for further evaluation..Marland KitchenPossible 11 mm pancreatic nodule rising from the tail, alternatively this may represent a splenule. This can also be assessed at time of hepatic MRI. Gallstone without gallbladder inflammation. Colonic diverticulosis without diverticulitis.   Most recent lab results (04/16/18)  of CBC is as follows: all values are WNL except for HGB at 9.5, HCT at 33.6, MCH at 23.8, MCHC at 28.3, RDW at 23.2, PLT at 435k. Ferritin 04/09/18 was low at 6 Vitamin B12 on 04/09/18 was at 196  On review of systems, pt reports light headedness, dizziness, lower back pain, intermittent abdominal pain, and denies black stools, blood in the stools, tingling or numbness in her legs or  arms, vaginal bleeding, and any other symptoms.   INTERVAL HISTORY:   Margaret [name] was called via phone for her phone visit today. After using 2 confirmatory information element phone visit was conducted. Patient notes that she is feeling better off chemotherapy but has had some nausea and vomiting. Pass gas and having BM's. Hesitant to restart chemotherapy. We had a long goals of care discussion and patient decided to pursue best supportive cares through hospice and hold off on any additional palliative chemotherapy.  MEDICAL HISTORY:  Past Medical History:  Diagnosis Date  . Allergic rhinitis   . Anxiety diagnosed in 1990  . Arthritis   . Asthma   . colon ca with liver mets dx'd 08/2018  . Dyspnea    with low blood count hx of anemia  . Eczema   . History of blood transfusion   . Hypertension   . Lower back pain   . Migraine   . Sleep apnea    uses CPAP    SURGICAL HISTORY: Past Surgical History:  Procedure Laterality Date  . ABDOMINAL HYSTERECTOMY    . cortisone injections     knees bilat and back   . IR IMAGING GUIDED PORT INSERTION  09/14/2018  . iron infusion    . LIVER BIOPSY      SOCIAL HISTORY: Social History   Socioeconomic History  . Marital status: Single    Spouse name: Not on file  . Number of children: Not on file  . Years of education: Not on file  . Highest education level: Not on file  Occupational History  . Not on file  Tobacco Use  . Smoking status: Former Smoker    Quit date: 07/30/1999    Years since quitting: 20.5  . Smokeless tobacco: Never Used  Substance and Sexual Activity  . Alcohol use: No  . Drug use: No  . Sexual activity: Not on file  Other Topics Concern  . Not on file  Social History Narrative  . Not on file   Social Determinants of Health   Financial Resource Strain:   . Difficulty of Paying Living Expenses:   Food Insecurity:   . Worried About Charity fundraiser in the Last Year:   . Arboriculturist in  the Last Year:   Transportation Needs:   . Film/video editor (Medical):   Marland Kitchen Lack of Transportation (Non-Medical):   Physical Activity:   . Days of Exercise per Week:   . Minutes of Exercise per Session:   Stress:   . Feeling of Stress :   Social Connections:   . Frequency of Communication with Friends and Family:   . Frequency of Social Gatherings with Friends and Family:   . Attends Religious Services:   . Active Member of Clubs or Organizations:   . Attends Archivist Meetings:   Marland Kitchen Marital Status:   Intimate Partner Violence:   . Fear of Current or Ex-Partner:   . Emotionally Abused:   Marland Kitchen Physically Abused:   . Sexually Abused:  FAMILY HISTORY: Family History  Problem Relation Age of Onset  . Diabetes Father     ALLERGIES:  is allergic to latex; tape; and tylenol with codeine #3 [acetaminophen-codeine].  MEDICATIONS:  Current Outpatient Medications  Medication Sig Dispense Refill  . albuterol (PROAIR HFA) 108 (90 Base) MCG/ACT inhaler INHALE TWO puffs BY MOUTH into the lungs EVERY 4 HOURS AS NEEDED FOR WHEEZING OR shortness of breath 18 g 1  . Boric Acid 4 % SOLN Place 1 capsule vaginally every Friday.     . budesonide-formoterol (SYMBICORT) 160-4.5 MCG/ACT inhaler Inhale 2 puffs into the lungs 2 (two) times a day. 10.2 g 5  . cetirizine (ZYRTEC) 10 MG tablet Take 1 tablet (10 mg total) by mouth daily. 30 tablet 5  . clobetasol ointment (TEMOVATE) 6.22 % Apply 1 application topically 2 (two) times daily. Use for Lichen Sclerosis    . Cyanocobalamin (B-12) 1000 MCG SUBL Place 1,000 mcg under the tongue daily. 30 each 3  . dexamethasone (DECADRON) 4 MG tablet TAKE 2 TABLETS (8 MG TOTAL) BY MOUTH DAILY  START THE DAY AFTER CHEMOTHERAPY FOR 2 DAYS. TAKE WITH FOOD. 30 tablet 1  . diazepam (VALIUM) 5 MG tablet Take 1 tablet (5 mg total) by mouth daily as needed for anxiety (panic attacks). 30 tablet 0  . diclofenac sodium (VOLTAREN) 1 % GEL Apply 2 g topically 4  (four) times daily as needed (for back/knee pain.).     Marland Kitchen dicyclomine (BENTYL) 20 MG tablet Take 1 tablet (20 mg total) by mouth 2 (two) times daily. (Patient taking differently: Take 20 mg by mouth 3 (three) times daily before meals. ) 20 tablet 0  . dronabinol (MARINOL) 5 MG capsule Take 1 capsule (5 mg total) by mouth 2 (two) times daily before a meal. 60 capsule 0  . ergocalciferol (VITAMIN D2) 50000 UNITS capsule Take 50,000 Units by mouth every Monday.     . lidocaine-prilocaine (EMLA) cream Apply to affected area once 30 g 3  . lisinopril-hydrochlorothiazide (PRINZIDE,ZESTORETIC) 20-12.5 MG tablet Take 1 tablet by mouth daily.    . methocarbamol (ROBAXIN) 750 MG tablet Take 750 mg by mouth every 8 (eight) hours as needed (back spasms.).     Marland Kitchen montelukast (SINGULAIR) 10 MG tablet Take 1 tablet (10 mg total) by mouth at bedtime. 30 tablet 6  . nystatin-triamcinolone (MYCOLOG II) cream Apply 1 application topically 2 (two) times daily as needed (irritation). Use in skin folds and under breast     . Olopatadine HCl (PAZEO) 0.7 % SOLN Apply 1 drop to eye daily. 2.5 mL 5  . ondansetron (ZOFRAN) 8 MG tablet Take 1 tablet (8 mg total) by mouth 2 (two) times daily as needed for refractory nausea / vomiting. Start on day 3 after chemotherapy. 30 tablet 0  . pantoprazole (PROTONIX) 40 MG tablet Take 1 tablet (40 mg total) by mouth daily before breakfast. 30 tablet 1  . pimecrolimus (ELIDEL) 1 % cream Apply 1 application topically 2 (two) times daily. 30 g 6  . prochlorperazine (COMPAZINE) 10 MG tablet Take 1 tablet (10 mg total) by mouth every 6 (six) hours as needed (Nausea or vomiting). 30 tablet 1  . saccharomyces boulardii (FLORASTOR) 250 MG capsule Take 1 capsule (250 mg total) by mouth 2 (two) times daily.    . traMADol (ULTRAM) 50 MG tablet Take 1-2 tablets (50-100 mg total) by mouth every 6 (six) hours as needed for moderate pain or severe pain. 90 tablet 0  . triamcinolone (  NASACORT) 55 MCG/ACT  AERO nasal inhaler Place 2 sprays into the nose daily as needed (for allergies). 1 Inhaler 5  . valACYclovir (VALTREX) 500 MG tablet Take 500 mg by mouth 2 (two) times daily as needed (for cold sores).      No current facility-administered medications for this visit.    Facility-Administered Medications Ordered in Other Visits  Medication Dose Route Frequency Provider Last Rate Last Dose  . fluorouracil (ADRUCIL) 6,950 mg in sodium chloride 0.9 % 111 mL chemo infusion  2,400 mg/m2 (Treatment Plan Recorded) Intravenous 1 day or 1 dose Brunetta Genera, MD      . leucovorin 1,156 mg in dextrose 5 % 250 mL infusion  400 mg/m2 (Treatment Plan Recorded) Intravenous Once Brunetta Genera, MD 77 mL/hr at 05/24/19 1242 1,156 mg at 05/24/19 1242  . oxaliplatin (ELOXATIN) 250 mg in dextrose 5 % 500 mL chemo infusion  250 mg Intravenous Once Brunetta Genera, MD 138 mL/hr at 05/24/19 1245 250 mg at 05/24/19 1245    REVIEW OF SYSTEMS:   A 10+ POINT REVIEW OF SYSTEMS WAS OBTAINED including neurology, dermatology, psychiatry, cardiac, respiratory, lymph, extremities, GI, GU, Musculoskeletal, constitutional, breasts, reproductive, HEENT.  All pertinent positives are noted in the HPI.  All others are negative.   PHYSICAL EXAMINATION:  ECOG FS:2 - Symptomatic, <50% confined to bed  There were no vitals filed for this visit. Wt Readings from Last 3 Encounters:  01/07/20 (!) 359 lb 5.6 oz (163 kg)  12/21/19 (!) 377 lb 3.2 oz (171.1 kg)  12/07/19 (!) 377 lb 8 oz (171.2 kg)   There is no height or weight on file to calculate BMI.   Marland Kitchen GENERAL:alert, in no acute distress and comfortable SKIN: no acute rashes, no significant lesions EYES: conjunctiva are pink and non-injected, sclera anicteric OROPHARYNX: MMM, no exudates, no oropharyngeal erythema or ulceration NECK: supple, no JVD LYMPH:  no palpable lymphadenopathy in the cervical, axillary or inguinal regions LUNGS: clear to auscultation b/l  with normal respiratory effort HEART: regular rate & rhythm ABDOMEN:  normoactive bowel sounds , non tender, not distended. Extremity: no pedal edema PSYCH: alert & oriented x 3 with fluent speech NEURO: no focal motor/sensory deficits   LABORATORY DATA:  I have reviewed the data as listed  . CBC Latest Ref Rng & Units 01/10/2020 01/09/2020 01/08/2020  WBC 4.0 - 10.5 K/uL 9.8 9.8 8.6  Hemoglobin 12.0 - 15.0 g/dL 9.0(L) 8.6(L) 7.7(L)  Hematocrit 36.0 - 46.0 % 31.2(L) 29.6(L) 26.5(L)  Platelets 150 - 400 K/uL 231 230 222   ANC 900 . CMP Latest Ref Rng & Units 01/10/2020 01/09/2020 01/08/2020  Glucose 70 - 99 mg/dL 92 90 97  BUN 6 - 20 mg/dL 23(H) 17 19  Creatinine 0.44 - 1.00 mg/dL 1.32(H) 1.20(H) 1.14(H)  Sodium 135 - 145 mmol/L 142 138 142  Potassium 3.5 - 5.1 mmol/L 4.0 4.0 3.9  Chloride 98 - 111 mmol/L 107 107 109  CO2 22 - 32 mmol/L _0 Calcium 8.9 - 10.3 mg/dL 8.9 8.5(L) 8.2(L)  Total Protein 6.5 - 8.1 g/dL - - -  Total Bilirubin 0.3 - 1.2 mg/dL - - -  Alkaline Phos 38 - 126 U/L - - -  AST 15 - 41 U/L - - -  ALT 0 - 44 U/L - - -   . Lab Results  Component Value Date   IRON 40 (L) 12/07/2019   TIBC 199 (L) 12/07/2019   IRONPCTSAT 20 (L)  12/07/2019   (Iron and TIBC)  Lab Results  Component Value Date   FERRITIN 716 (H) 12/07/2019   B12  - 196--> 584  07/22/18 Liver Biopsy:   08/27/18 Repeat Liver Biopsy:   Molecular Pathology:     RADIOGRAPHIC STUDIES: I have personally reviewed the radiological images as listed and agreed with the findings in the report. No results found.  ASSESSMENT & PLAN:   49 y.o. female with  1. Iron Deficiency Anemia - ? Previous heavy periods vs GI losses  Recent heavy NSAID use ? Ulcer  2. B12 deficiency Labs upon initial presentation from 04/16/18, HGB at 9.5. The 04/09/18 Ferritin was low at 6 and Vitamin B12 was at 196 -No antiparietal antibodies, no intrinsic factor antibodies, normal TSH all from  04/28/18  PLAN: -Continue NSAID avoidance  -continue 1037mg Vitamin B12 sublingually daily - B12 improved from 196 to 584  3.MetastaticAdenocarcinoma of cecum with liver mets. KRAS mutated.  04/16/18 CT A/P with pt revealed No acute findings in the abdomen/pelvis. Least 4 low-density hepatic lesions, some of which may represent small cysts or hemangiomas, however there is a 19 mm lesion in the left lobe of the liver that is incompletely characterized. Recommend nonemergent hepatic protocol MRI for further evaluation..Marland KitchenPossible 11 mm pancreatic nodule rising from the tail, alternatively this may represent a splenule. This can also be assessed at time of hepatic MRI. Gallstone without gallbladder inflammation. Colonic diverticulosis without diverticulitis.   07/09/18 Tumor marker work up: CEA normal at 1.32, AFP normal at 1.5, LDH normal at 138, CA 125 normal at 5.9, CA 19-9 normal at <2   07/22/18 Liver biopsy results revealed benign hepatic tissue, and was not diagnostic   07/16/18 PET/CT revealedThere is a solid hypermetabolic mass involving the cecum, worrisome for colonic primary neoplasm. Correlation withcolonoscopy Findings. 2. Multiple hypermetabolic liver lesions compatible with metastaticdisease. 3. Large low-density lesion in right lobe of thyroid gland. Advise further evaluation with thyroid sonography. 4. Aortic Atherosclerosis. 5. Small hiatal hernia.   08/27/18 Liver biopsy revealed Metastatic adenocarcinoma, consistent with gastrointestinal primary 08/27/18 Molecular pathology- KRAS mutation positive   09/03/18 ECHO revealed an LV EF of 601-77% mild diastolic dysfunction; mild RVE; dilated PA; mild TR; moderate to severe pulmonary hypertension  09/17/18 CEA at 2.39, which was the last CEA pre-treatment value   12/13/18 CT A/P revealed Interval response to therapy. There is been decrease in size of multifocal liver metastasis. No new or progressive findings. 2. Hiatal hernia.    Did postpone C6 by one week due to concern for mild infection.   Did postpone C7 by two weeks for infection  06/15/2019 CT C/A/P ([id number] ([id number] revealed "1. Significant interval enlargement of ill-defined, hypodense masses of the liver, the largest index mass of the inferior right lobe, segment VI, measuring 4.1 x 3.7 cm, previously 1.0 cm when measured similarly (series 2, image 58). There are additional lesions in caudate (series 2, image 54), lateral right lobe (series 2, image 52) and liver dome (series 2, image 39), all substantially enlarged compared to prior examination. Findings are consistent with worsened metastatic disease. 2. Redemonstrated residual mass at the cecal base appears thickened in comparison to prior examination, approximately 1.2 cm, previously 5 mm when measured similarly (series 4, image 61, series 2, image 110). 3. Stable 2 mm nodules of the left lung base (series 6, image 105, 116), likely benign and incidental. No definite evidence of metastatic disease in the chest. Attention on follow-up."1. Significant interval enlargement of  ill-defined, hypodense masses of the liver, the largest index mass of the inferior right lobe, segment VI, measuring 4.1 x 3.7 cm, previously 1.0 cm when measured similarly (series 2, image 58). There are additional lesions in caudate (series 2, image 54), lateral right lobe (series 2, image 52) and liver dome (series 2, image 39), all substantially enlarged compared to prior examination. Findings are consistent with worsened metastatic disease. 2. Redemonstrated residual mass at the cecal base appears thickened in comparison to prior examination, approximately 1.2 cm, previously 5 mm when measured similarly (series 4, image 61, series 2, image 110). 3. Stable 2 mm nodules of the left lung base (series 6, image 105, 116), likely benign and incidental. No definite evidence of metastatic disease in the chest. Attention on  follow-up."  10/27/2019 CT Abd/Pel ([id number]) revealed fairly significant progression in the cecum and liver, stable pulmonary nodules   PLAN: -called had a detailed goals of care discussion with patient. -best care medications refilled -Discussed continuing palliative chemotherapy treatment to slow disease progression given pt's recurrent health concerns vs transferring to hospice care for comfort measures and more complete symptom management. -patient chooses to proceed to Best supportive cares through hospice.  FOLLOW UP: Will cancel all subsequent chemotherapy appointments  The total time spent in the appt was 20 minutes and more than 50% was on counseling and direct patient cares.  All of the patient's questions were answered with apparent satisfaction. The patient knows to call the clinic with any problems, questions or concerns.  Sullivan Lone MD [place] AAHIVMS [facility] Hematology/Oncology Physician [facility]  (Office):       [phone] (Work cell):  [phone] (Fax):           [phone]  02/08/2020 8:39 AM   I, Yevette Edwards, am acting as a scribe for Dr. Sullivan Lone.   .I have reviewed the above documentation for accuracy and completeness, and I agree with the above. Brunetta Genera MD

## 2020-02-08 NOTE — Telephone Encounter (Signed)
Verbal order from Dr.Kale: refer to Hospice. [place] and provided referral information for patient.

## 2020-02-09 ENCOUNTER — Telehealth: Payer: Self-pay

## 2020-02-09 NOTE — Telephone Encounter (Signed)
TCT [facility] and spoke with Sherian Rein who verbally confirmed that referral to hospice for patient has been received.

## 2020-02-29 ENCOUNTER — Other Ambulatory Visit: Payer: Self-pay | Admitting: Hematology

## 2020-02-29 DIAGNOSIS — Z7189 Other specified counseling: Secondary | ICD-10-CM

## 2020-02-29 DIAGNOSIS — C189 Malignant neoplasm of colon, unspecified: Secondary | ICD-10-CM

## 2020-03-01 ENCOUNTER — Other Ambulatory Visit: Payer: Self-pay

## 2020-03-01 ENCOUNTER — Encounter: Payer: Self-pay | Admitting: Allergy & Immunology

## 2020-03-01 ENCOUNTER — Ambulatory Visit (INDEPENDENT_AMBULATORY_CARE_PROVIDER_SITE_OTHER): Payer: Medicaid Other | Admitting: Allergy & Immunology

## 2020-03-01 DIAGNOSIS — J3089 Other allergic rhinitis: Secondary | ICD-10-CM

## 2020-03-01 DIAGNOSIS — T50905A Adverse effect of unspecified drugs, medicaments and biological substances, initial encounter: Secondary | ICD-10-CM | POA: Diagnosis not present

## 2020-03-01 DIAGNOSIS — J302 Other seasonal allergic rhinitis: Secondary | ICD-10-CM

## 2020-03-01 DIAGNOSIS — L2089 Other atopic dermatitis: Secondary | ICD-10-CM | POA: Diagnosis not present

## 2020-03-01 DIAGNOSIS — J4540 Moderate persistent asthma, uncomplicated: Secondary | ICD-10-CM | POA: Diagnosis not present

## 2020-03-01 MED ORDER — PERFOROMIST 20 MCG/2ML IN NEBU: 20.0000 ug | INHALATION_SOLUTION | Freq: Two times a day (BID) | RESPIRATORY_TRACT | 1 refills | Status: DC

## 2020-03-01 MED ORDER — BUDESONIDE 0.5 MG/2ML IN SUSP: 0.5000 mg | Freq: Two times a day (BID) | RESPIRATORY_TRACT | 5 refills | Status: AC

## 2020-03-01 MED ORDER — ALBUTEROL SULFATE HFA 108 (90 BASE) MCG/ACT IN AERS: 2.0000 | INHALATION_SPRAY | RESPIRATORY_TRACT | 2 refills | Status: AC | PRN

## 2020-03-01 NOTE — Progress Notes (Signed)
RE: DESHONDRA WORST MRN: [id number] DOB: 16-May-1971 Date of Telemedicine Visit: 03/01/2020  Referring provider: Nolene Ebbs, MD Primary care provider: Nolene Ebbs, MD  Chief Complaint: No chief complaint on file.   Telemedicine Follow Up Visit via Telephone: I connected with Hilda Blades for a follow up on 03/01/20 by telephone and verified that I am speaking with the correct person using two identifiers.   I discussed the limitations, risks, security and privacy concerns of performing an evaluation and management service by telephone and the availability of in person appointments. I also discussed with the patient that there may be a patient responsible charge related to this service. The patient expressed understanding and agreed to proceed.  Patient is at home.  Provider is at the office.  Visit start time: 11:30 AM Visit end time: 11:48 AM Insurance consent/check in by: [facility] consent and medical assistant/nurse: Janett Billow  History of Present Illness:  She is a 49 y.o. female, who is being followed for moderate persistent asthma as well as seasonal and perennial allergic rhinitis and eczema. Her previous allergy office visit was in December 2020 with myself.  At that time, we sent in refills for all of her medications.  We continued Singulair 10 mg daily and Symbicort 80/4.5 mcg 2 puffs twice daily for control of her asthma.  For her rhinitis, would continue with Nasacort 1 to 2 sprays per nostril daily as well as Zyrtec 10 mg daily.  We did add on Augmentin 1 tablet twice daily for sinusitis and started her on a low-dose prednisone taper.  For her eczema, would continue with Elidel as well as moisturizing and nystatin twice daily as needed for fungal flares.  In the interim, she was admitted in April 2021 for respiratory failure with hypoxia. She had hypoxia down into 70s. She was supposed to discharged on supplemental O2, but this apparently was not done. She was  having some problems yesterday with gasping for air.   Asthma/Respiratory Symptom History: She is on the Symbicort two puffs twice daily. She is having some shortness of breath, unsure whether it is the cancer or the asthma. She needs a refill of the Symbicort. She was placed on a nebulizer lst month when she saw her primary doctor. She is needing the nebulizer fairly rarely. Of note, she does have a history of a right lung collapse from her cancer metastases.  She reports today that the Symbicort is causing burning of her throat.  She does have a nebulizer and appreciates the way that makes her feel.  She feels that she gets more of the medication this way.  Allergic Rhinitis Symptom History: She was exposed to grass yesterday. This caused her to have some coughing. She was givne Augmentin at her last visit with me and then she was diagnosed with COVID thereafter.  It does not look like she was ever bad enough to receive monoclonal antibodies.  She remains on her no spray as well as Zyrtec.  Eczema Symptom Symptom History: Eczema is under good control with moisturizing twice daily.  She is also on Elidel as needed.  Otherwise, there have been no changes to her past medical history, surgical history, family history, or social history.  Assessment and Plan:  Ramonda is a 49 y.o. female with:  Moderate persistent asthma, uncomplicated  Burning sensation with Symbicort  Seasonal and perennial allergic rhinitis  Eczema  Stage IV colon cancer with metastases   Due to the burning sensation of the Symbicort  inhaler, I think it is a good idea to change her to nebulized forms of her medications.  It is hard to tease out what is related to the medication itself versus a side effect of her chemotherapy regimen for her stage IV colon cancer.  We are going to go ahead and send in Perforomist plus Pulmicort twice daily.  I am hopeful that this will help maintain her control of her asthma while she is  dealing with end of life care for her colon cancer.  Her rhinitis seems well controlled with the current set of medications.  Will not make any changes there.  We also refill her Elidel.   Diagnostics: None.  Medication List:  Current Outpatient Medications  Medication Sig Dispense Refill  . albuterol (PROAIR HFA) 108 (90 Base) MCG/ACT inhaler Inhale 2 puffs into the lungs every 4 (four) hours as needed for wheezing or shortness of breath. 6.7 g 2  . ascorbic acid (VITAMIN C) 500 MG tablet Take 500 mg by mouth daily.    . Boric Acid 4 % SOLN Place 1 capsule vaginally every Friday.     . budesonide-formoterol (SYMBICORT) 160-4.5 MCG/ACT inhaler Inhale 2 puffs into the lungs 2 (two) times daily as needed (sob/wheezing).    . cetirizine (ZYRTEC) 10 MG tablet Take 1 tablet (10 mg total) by mouth daily. (Patient taking differently: Take 10 mg by mouth daily as needed for allergies. ) 30 tablet 5  . clobetasol ointment (TEMOVATE) 2.59 % Apply 1 application topically 2 (two) times daily as needed (lichen sclerosis). Use for Lichen Sclerosis     . Cyanocobalamin (B-12) 1000 MCG SUBL Place 1,000 mcg under the tongue daily. 30 each 3  . diazepam (VALIUM) 5 MG tablet Take 1 tablet (5 mg total) by mouth daily as needed for anxiety (panic attacks). 30 tablet 0  . diclofenac sodium (VOLTAREN) 1 % GEL Apply 2 g topically 4 (four) times daily as needed (for back/knee pain.).     Marland Kitchen dicyclomine (BENTYL) 20 MG tablet Take 1 tablet (20 mg total) by mouth 2 (two) times daily. (Patient taking differently: Take 20 mg by mouth 3 (three) times daily before meals. ) 20 tablet 0  . dronabinol (MARINOL) 5 MG capsule Take 1 capsule (5 mg total) by mouth 2 (two) times daily before a meal. (Patient not taking: Reported on 01/06/2020) 60 capsule 0  . ergocalciferol (VITAMIN D2) 50000 UNITS capsule Take 50,000 Units by mouth every Monday.     Marland Kitchen lisinopril-hydrochlorothiazide (PRINZIDE,ZESTORETIC) 20-12.5 MG tablet Take 1 tablet  by mouth daily.    Marland Kitchen loperamide (IMODIUM A-D) 2 MG tablet Take 1 tablet (2 mg total) by mouth 4 (four) times daily as needed. Take 2 at diarrhea onset , then 1 every 2hr until 12hrs with no BM. May take 2 every 4hrs at night. If diarrhea recurs repeat. 100 tablet 1  . LORazepam (ATIVAN) 0.5 MG tablet Take 1-2 tablets (0.5-1 mg total) by mouth every 8 (eight) hours. 30 tablet 0  . magic mouthwash SOLN Take 5 mLs by mouth 4 (four) times daily as needed. (Patient taking differently: Take 5 mLs by mouth 4 (four) times daily as needed for mouth pain. ) 80 mL 0  . montelukast (SINGULAIR) 10 MG tablet Take 1 tablet (10 mg total) by mouth at bedtime. 30 tablet 6  . nystatin-triamcinolone (MYCOLOG II) cream Apply 1 application topically 2 (two) times daily as needed (irritation). Use in skin folds and under breast     .  Olopatadine HCl (PAZEO) 0.7 % SOLN Apply 1 drop to eye daily. (Patient taking differently: Apply 1 drop to eye daily as needed (dry eyes). ) 2.5 mL 5  . ondansetron (ZOFRAN) 8 MG tablet Take 1 tablet (8 mg total) by mouth every 8 (eight) hours as needed for nausea or vomiting. 20 tablet 0  . oxyCODONE (OXY IR/ROXICODONE) 5 MG immediate release tablet Take 1-2 tablets (5-10 mg total) by mouth every 4 (four) hours as needed for moderate pain or severe pain (cancer related pain). 60 tablet 0  . pantoprazole (PROTONIX) 40 MG tablet Take 1 tablet (40 mg total) by mouth daily before breakfast. 30 tablet 1  . pimecrolimus (ELIDEL) 1 % cream Apply 1 application topically 2 (two) times daily. (Patient taking differently: Apply 1 application topically 2 (two) times daily as needed (eczema). ) 30 g 6  . prochlorperazine (COMPAZINE) 10 MG tablet Take 1 tablet (10 mg total) by mouth every 6 (six) hours as needed (NAUSEA). 30 tablet 1  . promethazine (PHENERGAN) 25 MG tablet Take 1 tablet (25 mg total) by mouth every 6 (six) hours as needed for nausea. (Patient not taking: Reported on 12/21/2019) 30 tablet 0  .  saccharomyces boulardii (FLORASTOR) 250 MG capsule Take 1 capsule (250 mg total) by mouth 2 (two) times daily.    Marland Kitchen senna-docusate (SENNA S) 8.6-50 MG tablet Take 2 tablets by mouth at bedtime. (Patient taking differently: Take 2 tablets by mouth at bedtime as needed for mild constipation. ) 60 tablet 2  . TOBRADEX ophthalmic solution Place 1 drop into both eyes every 6 (six) hours as needed (dry eyes).     . triamcinolone (NASACORT) 55 MCG/ACT AERO nasal inhaler Place 2 sprays into the nose daily as needed (for allergies). (Patient not taking: Reported on 01/06/2020) 1 Inhaler 5  . valACYclovir (VALTREX) 500 MG tablet Take 500 mg by mouth 2 (two) times daily as needed (for cold sores).      No current facility-administered medications for this visit.   Allergies: Allergies  Allergen Reactions  . Latex Hives and Shortness Of Breath  . Tape Rash  . Tylenol With Codeine #3 [Acetaminophen-Codeine] Itching and Rash   I reviewed her past medical history, social history, family history, and environmental history and no significant changes have been reported from previous visits.  Review of Systems  Constitutional: Positive for chills, diaphoresis and fatigue. Negative for activity change and appetite change.  HENT: Negative for congestion, mouth sores, postnasal drip, rhinorrhea, sinus pressure and sore throat.   Eyes: Negative for pain, discharge, redness and itching.  Respiratory: Negative for shortness of breath, wheezing and stridor.   Gastrointestinal: Positive for abdominal pain. Negative for diarrhea, nausea and vomiting.  Endocrine: Negative for cold intolerance and heat intolerance.  Musculoskeletal: Negative for arthralgias, joint swelling and myalgias.  Skin: Negative for rash.  Allergic/Immunologic: Negative for environmental allergies and food allergies.    Objective:  Physical exam not obtained as encounter was done via telephone.   Previous notes and tests were reviewed.  I  discussed the assessment and treatment plan with the patient. The patient was provided an opportunity to ask questions and all were answered. The patient agreed with the plan and demonstrated an understanding of the instructions.   The patient was advised to call back or seek an in-person evaluation if the symptoms worsen or if the condition fails to improve as anticipated.  I provided 18 minutes of non-face-to-face time during this encounter.  It was  my pleasure to participate in [facility] care today. Please feel free to contact me with any questions or concerns.   Sincerely,  Valentina Shaggy, MD

## 2020-03-01 NOTE — Patient Instructions (Addendum)
1. Moderate persistent asthma, uncomplicated - We will stop the Symbicort and start nebulized forms of the Symbicort instead. - Daily controller medication(s): Perforomist + Pulmicort 0.5mg  twice daily via nebulizer - Prior to physical activity: ProAir 2 puffs 10-15 minutes before physical activity. - Rescue medications: ProAir 4 puffs every 4-6 hours as needed - Asthma control goals:  * Full participation in all desired activities (may need albuterol before activity) * Albuterol use two time or less a week on average (not counting use with activity) * Cough interfering with sleep two time or less a month * Oral steroids no more than once a year * No hospitalizations  2. Seasonal and perennial allergic rhinitis - with overlying acute sinusitis - Continue with Nasacort 1-2 sprays per nostril daily. - Continue with Zyrtec 10mg  daily.   3. Eczema - Continue with Elidel as needed.  - Continue with moisturizing twice daily.  - Continue with nystatin twice daily as needed for fungal flares.   4. Return in about 6 months (around 08/31/2020). This can be an in-person, a virtual Webex or a telephone follow up visit.   Please inform us of any Emergency Department visits, hospitalizations, or changes in symptoms. Call us before going to the ED for breathing or allergy symptoms since we might be able to fit you in for a sick visit. Feel free to contact us anytime with any questions, problems, or concerns.  It was a pleasure to talk to you today today!  Websites that have reliable patient information: 1. American Academy of Asthma, Allergy, and Immunology: www.aaaai.org 2. Food Allergy Research and Education (FARE): foodallergy.org 3. Mothers of Asthmatics: http://www.asthmacommunitynetwork.org 4. American College of Allergy, Asthma, and Immunology: www.acaai.org   COVID-19 Vaccine Information can be found at: ShippingScam.co.uk For  questions related to vaccine distribution or appointments, please email [email or web address] or call 5311444151.     "Like" [place] on Facebook and Instagram for our latest updates!        Make sure you are registered to vote! If you have moved or changed any of your contact information, you will need to get this updated before voting!  In some cases, you MAY be able to register to vote online: [email or web address]

## 2020-03-02 ENCOUNTER — Telehealth: Payer: Self-pay

## 2020-03-02 MED ORDER — PERFOROMIST 20 MCG/2ML IN NEBU: 20.0000 ug | INHALATION_SOLUTION | Freq: Two times a day (BID) | RESPIRATORY_TRACT | 1 refills | Status: AC

## 2020-03-02 NOTE — Telephone Encounter (Signed)
[facility] called in regards to an order they received for this patient yesterday, they stated that they are unable to provide medication for this patient because she only has medicaid and not a medicare or medicare supplement plan. Will send to patients local pharmacy.

## 2020-03-07 NOTE — Telephone Encounter (Signed)
Prior authorization for Perforomist nebulizer solution has been approved and faxed to pharmacy. I have also sent a copy of the approval notification to the scan center.

## 2020-04-15 DEATH — deceased

## 2020-09-18 IMAGING — CT CT ABD-PELV W/ CM
2 of 5 series · 13 of 36 positions shown, 16 images · IV contrast (APPLIED)
Comparison: 06/15/2019

CLINICAL DATA: Metastatic colon cancer, evaluate treatment response

EXAM:
CT CHEST, ABDOMEN, AND PELVIS WITH CONTRAST
TECHNIQUE: Multidetector CT imaging of the chest, abdomen and pelvis was
performed following the standard protocol during bolus
administration of intravenous contrast.
CONTRAST:  100mL OMNIPAQUE IOHEXOL 300 MG/ML SOLN, additional oral
enteric contrast

[Series 2: cap with · axial · 0.95mm/px · z∈[-646,-106]mm · 10 of 132 slices shown, 13 images]
[im 12/132  mediastinal]
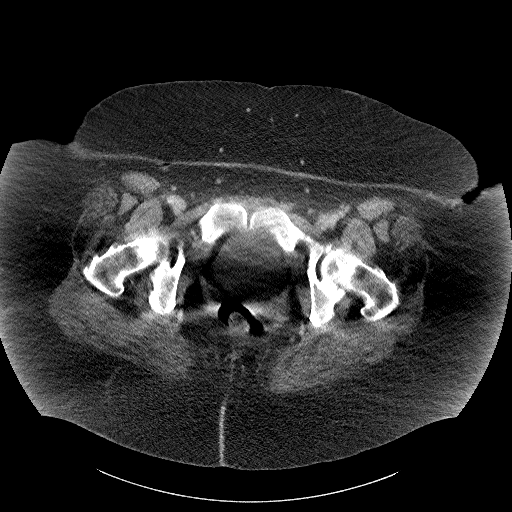
[im 12/132  lung]
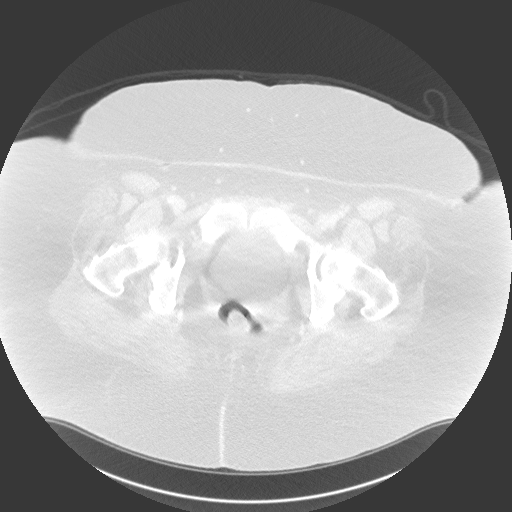
[im 24/132  lung]
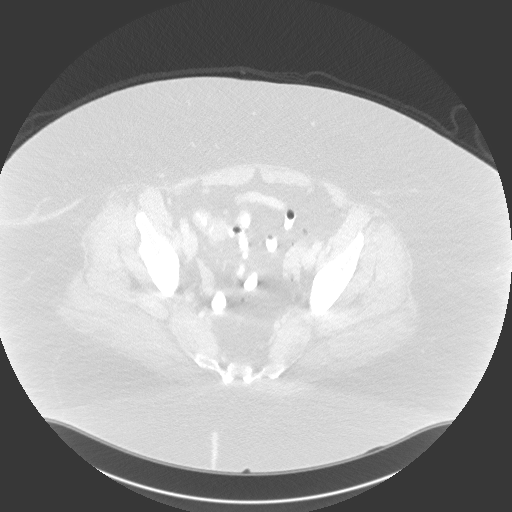
[im 36/132  lung]
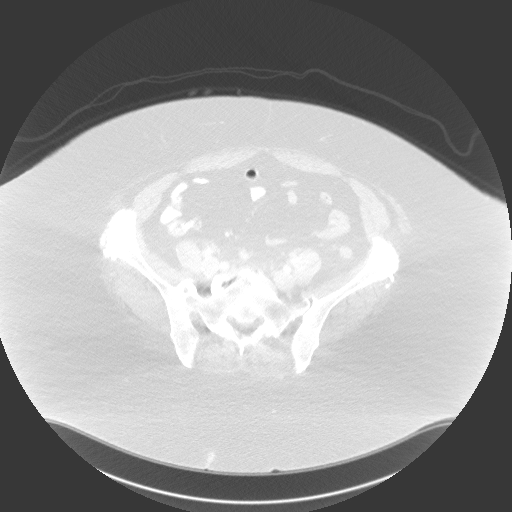
[im 48/132  lung]
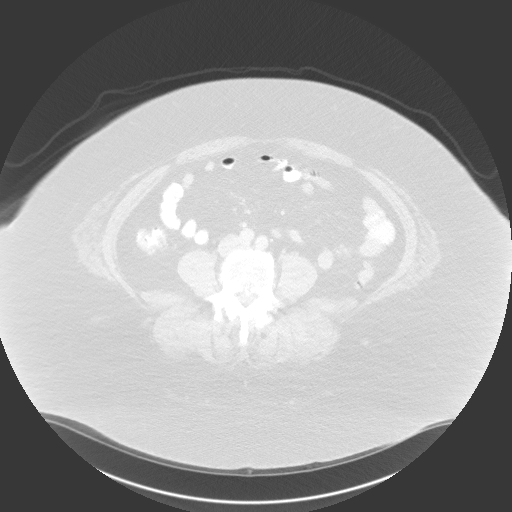
[im 60/132  mediastinal]
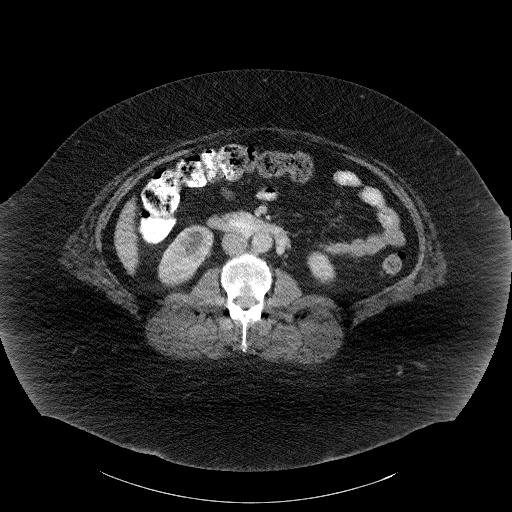
[im 60/132  lung]
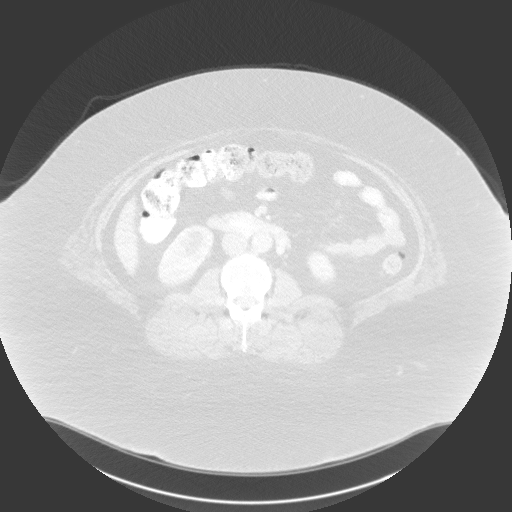
[im 72/132  lung]
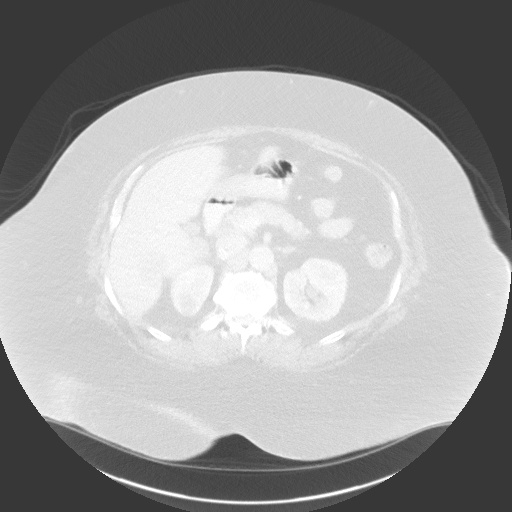
[im 84/132  lung]
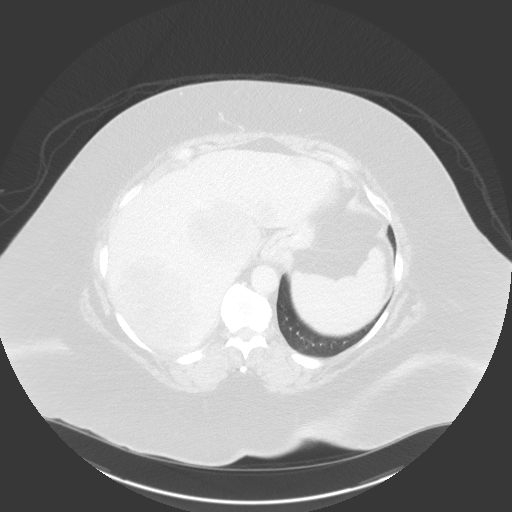
[im 96/132  lung]
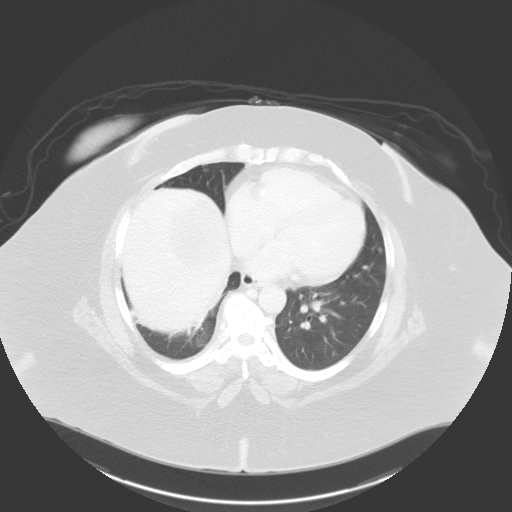
[im 108/132  mediastinal]
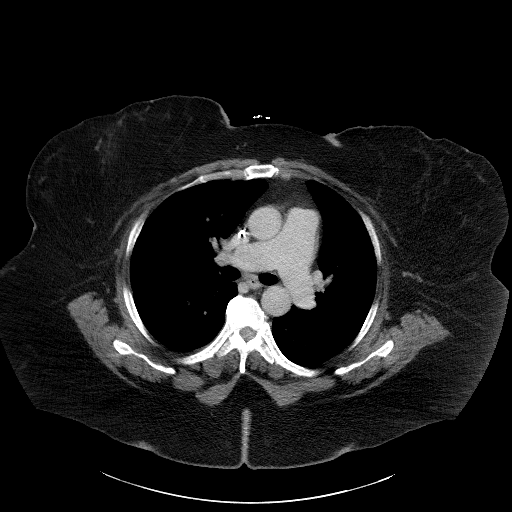
[im 108/132  lung]
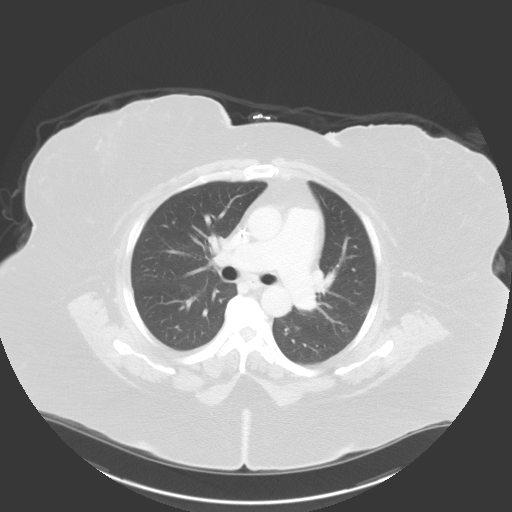
[im 120/132  lung]
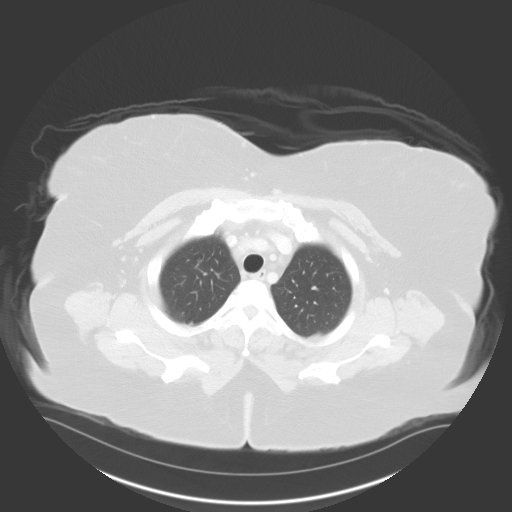

[Series 4: coronals · coronal · 0.97mm/px · 3 of 165 slices shown]
[im 33/165  lung]
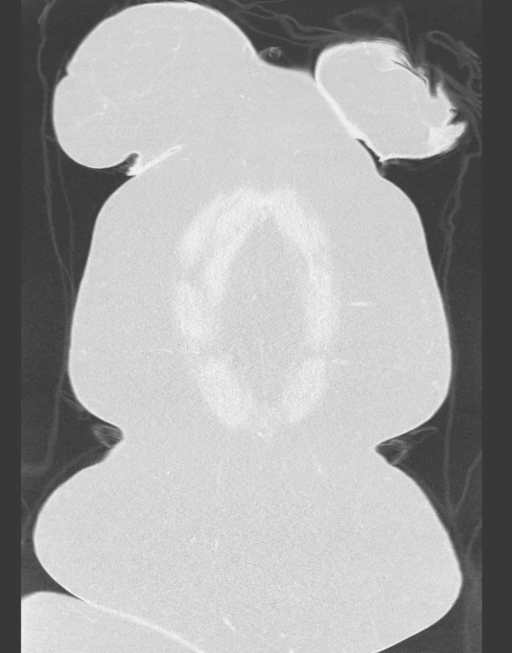
[im 66/165  lung]
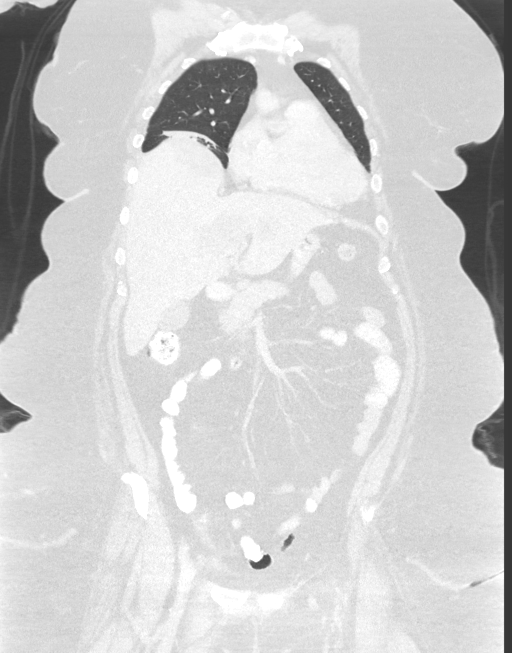
[im 99/165  lung]
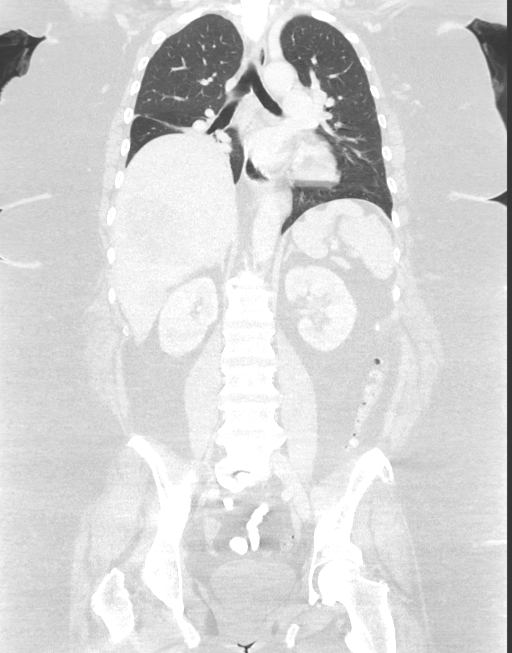

[13 of 36 positions shown; findings below may reference images not displayed]

FINDINGS: CT CHEST FINDINGS

Cardiovascular: Right chest port catheter. Normal heart size.
Enlargement of the main pulmonary artery up to 3.7 cm no pericardial
effusion.

Mediastinum/Nodes: No enlarged mediastinal, hilar, or axillary lymph
nodes. Large, heterogeneous nodule of the right lobe of the thyroid,
measuring 2.6 cm. Trachea, and esophagus demonstrate no significant
findings.

Lungs/Pleura: Unchanged bandlike scarring and volume loss of the
right lobe lower and middle lobes. Unchanged small pulmonary
nodules, including a 2 mm subpleural nodule of the left lower lobe
(series 6, image 103) and a 4 mm nodule of the medial anterior right
upper lobe (series 6, image 49). No pleural effusion or
pneumothorax.

Musculoskeletal: No chest wall mass or suspicious bone lesions
identified.

CT ABDOMEN PELVIS FINDINGS

Hepatobiliary: Marked interval increase in size of multiple bulky
hypodense masses of the liver, the largest in the lateral right lobe
measuring 10.1 x 6.3 cm, previously 4.0 x 3.6 cm (series 2, image
47). There are multiple additional lesions of the right lobe of the
liver and new lesions in the left lobe of the liver. No gallstones,
gallbladder wall thickening, or biliary dilatation.

Pancreas: Unremarkable. No pancreatic ductal dilatation or
surrounding inflammatory changes.

Spleen: Normal in size without significant abnormality.

Adrenals/Urinary Tract: Adrenal glands are unremarkable. Kidneys are
normal, without renal calculi, solid lesion, or hydronephrosis.
Bladder is unremarkable.

Stomach/Bowel: Stomach is within normal limits. Appendix appears
normal. Significant interval increase in thickness and bulk of an
endoluminal mass of the cecal base near the ileocecal valve,
measuring at least 3.1 cm in thickness, previously approximately
cm (series 4, image 78). Descending and sigmoid diverticulosis.

Vascular/Lymphatic: No significant vascular findings are present. No
enlarged abdominal or pelvic lymph nodes.

Reproductive: Status post hysterectomy.

Other: No abdominal wall hernia or abnormality. No abdominopelvic
ascites.

Musculoskeletal: No acute or significant osseous findings. Unchanged
benign sclerotic bone island of the right ilium (series 4, image
95).
IMPRESSION: 1. Significant interval increase in size and bulk of an endoluminal
mass of the cecal base near the ileocecal valve, measuring at least
3.1 cm in thickness, previously approximately 1.6 cm. Findings are
consistent with worsened primary malignancy.
2. Marked interval increase in size of multiple bulky hypodense
masses of the liver, the largest in the lateral right lobe measuring
10.1 x 6.3 cm, previously 4.0 x 3.6 cm. There are multiple
additional lesions of the right lobe of the liver and new lesions in
the left lobe of the liver. Findings are consistent with worsened
hepatic metastatic disease.
3. Stable small pulmonary nodules, likely benign and incidental. No
specific evidence of metastatic disease within the chest. Attention
on follow-up.
4. Enlargement of the main pulmonary artery up to 3.7 cm, which can
be seen with pulmonary arterial hypertension.

## 2023-08-05 NOTE — Telephone Encounter (Signed)
Telephone call
